# Patient Record
Sex: Female | Born: 1945 | Hispanic: No | Marital: Married | Smoking: Current every day smoker
Health system: Southern US, Community
[De-identification: ages and names within clinical notes are randomized; demographics above are authoritative.]

## PROBLEM LIST (undated history)

## (undated) DIAGNOSIS — J302 Other seasonal allergic rhinitis: Secondary | ICD-10-CM

## (undated) DIAGNOSIS — I499 Cardiac arrhythmia, unspecified: Secondary | ICD-10-CM

## (undated) DIAGNOSIS — C801 Malignant (primary) neoplasm, unspecified: Secondary | ICD-10-CM

## (undated) DIAGNOSIS — J189 Pneumonia, unspecified organism: Secondary | ICD-10-CM

## (undated) DIAGNOSIS — T7840XA Allergy, unspecified, initial encounter: Secondary | ICD-10-CM

## (undated) DIAGNOSIS — R911 Solitary pulmonary nodule: Secondary | ICD-10-CM

## (undated) DIAGNOSIS — K219 Gastro-esophageal reflux disease without esophagitis: Secondary | ICD-10-CM

## (undated) DIAGNOSIS — I219 Acute myocardial infarction, unspecified: Secondary | ICD-10-CM

## (undated) DIAGNOSIS — I10 Essential (primary) hypertension: Secondary | ICD-10-CM

## (undated) DIAGNOSIS — R011 Cardiac murmur, unspecified: Secondary | ICD-10-CM

## (undated) DIAGNOSIS — I4891 Unspecified atrial fibrillation: Secondary | ICD-10-CM

## (undated) DIAGNOSIS — Z720 Tobacco use: Secondary | ICD-10-CM

## (undated) DIAGNOSIS — R519 Headache, unspecified: Secondary | ICD-10-CM

## (undated) DIAGNOSIS — N183 Chronic kidney disease, stage 3 unspecified: Secondary | ICD-10-CM

## (undated) DIAGNOSIS — G4730 Sleep apnea, unspecified: Secondary | ICD-10-CM

## (undated) DIAGNOSIS — M810 Age-related osteoporosis without current pathological fracture: Secondary | ICD-10-CM

## (undated) DIAGNOSIS — J449 Chronic obstructive pulmonary disease, unspecified: Secondary | ICD-10-CM

## (undated) DIAGNOSIS — R7989 Other specified abnormal findings of blood chemistry: Secondary | ICD-10-CM

## (undated) DIAGNOSIS — I5032 Chronic diastolic (congestive) heart failure: Secondary | ICD-10-CM

## (undated) DIAGNOSIS — M069 Rheumatoid arthritis, unspecified: Secondary | ICD-10-CM

## (undated) DIAGNOSIS — Z8551 Personal history of malignant neoplasm of bladder: Secondary | ICD-10-CM

## (undated) DIAGNOSIS — I509 Heart failure, unspecified: Secondary | ICD-10-CM

## (undated) HISTORY — DX: Malignant (primary) neoplasm, unspecified: C80.1

## (undated) HISTORY — DX: Tobacco use: Z72.0

## (undated) HISTORY — DX: Solitary pulmonary nodule: R91.1

## (undated) HISTORY — DX: Sleep apnea, unspecified: G47.30

## (undated) HISTORY — PX: ABDOMINAL HYSTERECTOMY: SHX81

## (undated) HISTORY — DX: Other seasonal allergic rhinitis: J30.2

## (undated) HISTORY — DX: Other specified abnormal findings of blood chemistry: R79.89

## (undated) HISTORY — DX: Heart failure, unspecified: I50.9

## (undated) HISTORY — DX: Chronic diastolic (congestive) heart failure: I50.32

## (undated) HISTORY — DX: Rheumatoid arthritis, unspecified: M06.9

## (undated) HISTORY — PX: BLADDER SURGERY: SHX569

## (undated) HISTORY — DX: Age-related osteoporosis without current pathological fracture: M81.0

## (undated) HISTORY — PX: MOUTH SURGERY: SHX715

## (undated) HISTORY — DX: Allergy, unspecified, initial encounter: T78.40XA

## (undated) HISTORY — DX: Chronic kidney disease, stage 3 unspecified: N18.30

## (undated) HISTORY — DX: Essential (primary) hypertension: I10

## (undated) HISTORY — DX: Unspecified atrial fibrillation: I48.91

## (undated) HISTORY — DX: Chronic obstructive pulmonary disease, unspecified: J44.9

## (undated) HISTORY — DX: Personal history of malignant neoplasm of bladder: Z85.51

---

## 2011-03-29 DIAGNOSIS — J069 Acute upper respiratory infection, unspecified: Secondary | ICD-10-CM | POA: Diagnosis not present

## 2011-03-29 DIAGNOSIS — G608 Other hereditary and idiopathic neuropathies: Secondary | ICD-10-CM | POA: Diagnosis not present

## 2011-03-29 DIAGNOSIS — J209 Acute bronchitis, unspecified: Secondary | ICD-10-CM | POA: Diagnosis not present

## 2011-05-01 DIAGNOSIS — H1010 Acute atopic conjunctivitis, unspecified eye: Secondary | ICD-10-CM | POA: Diagnosis not present

## 2011-05-01 DIAGNOSIS — H35039 Hypertensive retinopathy, unspecified eye: Secondary | ICD-10-CM | POA: Diagnosis not present

## 2011-05-01 DIAGNOSIS — H2510 Age-related nuclear cataract, unspecified eye: Secondary | ICD-10-CM | POA: Diagnosis not present

## 2011-07-03 DIAGNOSIS — J189 Pneumonia, unspecified organism: Secondary | ICD-10-CM | POA: Diagnosis not present

## 2011-07-03 DIAGNOSIS — J0100 Acute maxillary sinusitis, unspecified: Secondary | ICD-10-CM | POA: Diagnosis not present

## 2011-07-03 DIAGNOSIS — R0609 Other forms of dyspnea: Secondary | ICD-10-CM | POA: Diagnosis not present

## 2011-07-16 DIAGNOSIS — J180 Bronchopneumonia, unspecified organism: Secondary | ICD-10-CM | POA: Diagnosis not present

## 2011-07-16 DIAGNOSIS — R062 Wheezing: Secondary | ICD-10-CM | POA: Diagnosis not present

## 2011-07-16 DIAGNOSIS — B370 Candidal stomatitis: Secondary | ICD-10-CM | POA: Diagnosis not present

## 2011-11-30 DIAGNOSIS — I10 Essential (primary) hypertension: Secondary | ICD-10-CM | POA: Diagnosis not present

## 2011-11-30 DIAGNOSIS — R05 Cough: Secondary | ICD-10-CM | POA: Diagnosis not present

## 2011-11-30 DIAGNOSIS — Z23 Encounter for immunization: Secondary | ICD-10-CM | POA: Diagnosis not present

## 2011-11-30 DIAGNOSIS — J309 Allergic rhinitis, unspecified: Secondary | ICD-10-CM | POA: Diagnosis not present

## 2011-11-30 DIAGNOSIS — R059 Cough, unspecified: Secondary | ICD-10-CM | POA: Diagnosis not present

## 2012-03-04 DIAGNOSIS — R059 Cough, unspecified: Secondary | ICD-10-CM | POA: Diagnosis not present

## 2012-03-04 DIAGNOSIS — R5381 Other malaise: Secondary | ICD-10-CM | POA: Diagnosis not present

## 2012-03-04 DIAGNOSIS — R5383 Other fatigue: Secondary | ICD-10-CM | POA: Diagnosis not present

## 2012-03-04 DIAGNOSIS — E538 Deficiency of other specified B group vitamins: Secondary | ICD-10-CM | POA: Diagnosis not present

## 2012-03-04 DIAGNOSIS — M25529 Pain in unspecified elbow: Secondary | ICD-10-CM | POA: Diagnosis not present

## 2012-03-04 DIAGNOSIS — M25579 Pain in unspecified ankle and joints of unspecified foot: Secondary | ICD-10-CM | POA: Diagnosis not present

## 2012-03-04 DIAGNOSIS — R062 Wheezing: Secondary | ICD-10-CM | POA: Diagnosis not present

## 2012-03-04 DIAGNOSIS — R05 Cough: Secondary | ICD-10-CM | POA: Diagnosis not present

## 2012-04-03 DIAGNOSIS — G4710 Hypersomnia, unspecified: Secondary | ICD-10-CM | POA: Diagnosis not present

## 2012-04-03 DIAGNOSIS — R0982 Postnasal drip: Secondary | ICD-10-CM | POA: Diagnosis not present

## 2012-04-03 DIAGNOSIS — R5381 Other malaise: Secondary | ICD-10-CM | POA: Diagnosis not present

## 2012-04-03 DIAGNOSIS — R0609 Other forms of dyspnea: Secondary | ICD-10-CM | POA: Diagnosis not present

## 2012-04-03 DIAGNOSIS — R5383 Other fatigue: Secondary | ICD-10-CM | POA: Diagnosis not present

## 2012-04-03 DIAGNOSIS — G4730 Sleep apnea, unspecified: Secondary | ICD-10-CM | POA: Diagnosis not present

## 2012-04-03 DIAGNOSIS — E559 Vitamin D deficiency, unspecified: Secondary | ICD-10-CM | POA: Diagnosis not present

## 2012-04-10 DIAGNOSIS — G4730 Sleep apnea, unspecified: Secondary | ICD-10-CM | POA: Diagnosis not present

## 2012-04-10 DIAGNOSIS — G4710 Hypersomnia, unspecified: Secondary | ICD-10-CM | POA: Diagnosis not present

## 2012-04-17 DIAGNOSIS — R0609 Other forms of dyspnea: Secondary | ICD-10-CM | POA: Diagnosis not present

## 2012-04-17 DIAGNOSIS — R5381 Other malaise: Secondary | ICD-10-CM | POA: Diagnosis not present

## 2012-04-17 DIAGNOSIS — R0982 Postnasal drip: Secondary | ICD-10-CM | POA: Diagnosis not present

## 2012-04-17 DIAGNOSIS — G4730 Sleep apnea, unspecified: Secondary | ICD-10-CM | POA: Diagnosis not present

## 2012-04-17 DIAGNOSIS — G4710 Hypersomnia, unspecified: Secondary | ICD-10-CM | POA: Diagnosis not present

## 2012-04-17 DIAGNOSIS — R0989 Other specified symptoms and signs involving the circulatory and respiratory systems: Secondary | ICD-10-CM | POA: Diagnosis not present

## 2012-05-03 DIAGNOSIS — G4710 Hypersomnia, unspecified: Secondary | ICD-10-CM | POA: Diagnosis not present

## 2012-05-03 DIAGNOSIS — G4730 Sleep apnea, unspecified: Secondary | ICD-10-CM | POA: Diagnosis not present

## 2012-05-15 DIAGNOSIS — R0989 Other specified symptoms and signs involving the circulatory and respiratory systems: Secondary | ICD-10-CM | POA: Diagnosis not present

## 2012-05-15 DIAGNOSIS — G4730 Sleep apnea, unspecified: Secondary | ICD-10-CM | POA: Diagnosis not present

## 2012-05-15 DIAGNOSIS — R5383 Other fatigue: Secondary | ICD-10-CM | POA: Diagnosis not present

## 2012-05-15 DIAGNOSIS — R0609 Other forms of dyspnea: Secondary | ICD-10-CM | POA: Diagnosis not present

## 2012-05-15 DIAGNOSIS — R5381 Other malaise: Secondary | ICD-10-CM | POA: Diagnosis not present

## 2012-05-15 DIAGNOSIS — G4710 Hypersomnia, unspecified: Secondary | ICD-10-CM | POA: Diagnosis not present

## 2012-05-15 DIAGNOSIS — R0982 Postnasal drip: Secondary | ICD-10-CM | POA: Diagnosis not present

## 2012-05-15 DIAGNOSIS — Z006 Encounter for examination for normal comparison and control in clinical research program: Secondary | ICD-10-CM | POA: Diagnosis not present

## 2012-05-21 DIAGNOSIS — G4730 Sleep apnea, unspecified: Secondary | ICD-10-CM | POA: Diagnosis not present

## 2012-05-21 DIAGNOSIS — G4710 Hypersomnia, unspecified: Secondary | ICD-10-CM | POA: Diagnosis not present

## 2012-05-28 DIAGNOSIS — G4730 Sleep apnea, unspecified: Secondary | ICD-10-CM | POA: Diagnosis not present

## 2012-05-28 DIAGNOSIS — G4710 Hypersomnia, unspecified: Secondary | ICD-10-CM | POA: Diagnosis not present

## 2012-05-28 DIAGNOSIS — R0609 Other forms of dyspnea: Secondary | ICD-10-CM | POA: Diagnosis not present

## 2012-05-28 DIAGNOSIS — R0982 Postnasal drip: Secondary | ICD-10-CM | POA: Diagnosis not present

## 2012-05-28 DIAGNOSIS — R5381 Other malaise: Secondary | ICD-10-CM | POA: Diagnosis not present

## 2012-06-05 DIAGNOSIS — G4730 Sleep apnea, unspecified: Secondary | ICD-10-CM | POA: Diagnosis not present

## 2012-06-05 DIAGNOSIS — G4710 Hypersomnia, unspecified: Secondary | ICD-10-CM | POA: Diagnosis not present

## 2012-07-07 DIAGNOSIS — R0982 Postnasal drip: Secondary | ICD-10-CM | POA: Diagnosis not present

## 2012-07-07 DIAGNOSIS — R5381 Other malaise: Secondary | ICD-10-CM | POA: Diagnosis not present

## 2012-07-07 DIAGNOSIS — R0609 Other forms of dyspnea: Secondary | ICD-10-CM | POA: Diagnosis not present

## 2012-07-07 DIAGNOSIS — G4710 Hypersomnia, unspecified: Secondary | ICD-10-CM | POA: Diagnosis not present

## 2012-07-07 DIAGNOSIS — R0989 Other specified symptoms and signs involving the circulatory and respiratory systems: Secondary | ICD-10-CM | POA: Diagnosis not present

## 2012-07-07 DIAGNOSIS — F17200 Nicotine dependence, unspecified, uncomplicated: Secondary | ICD-10-CM | POA: Diagnosis not present

## 2012-07-07 DIAGNOSIS — R05 Cough: Secondary | ICD-10-CM | POA: Diagnosis not present

## 2012-07-07 DIAGNOSIS — R5383 Other fatigue: Secondary | ICD-10-CM | POA: Diagnosis not present

## 2012-07-07 DIAGNOSIS — R059 Cough, unspecified: Secondary | ICD-10-CM | POA: Diagnosis not present

## 2012-07-08 DIAGNOSIS — G4710 Hypersomnia, unspecified: Secondary | ICD-10-CM | POA: Diagnosis not present

## 2012-07-09 DIAGNOSIS — R05 Cough: Secondary | ICD-10-CM | POA: Diagnosis not present

## 2012-07-09 DIAGNOSIS — J309 Allergic rhinitis, unspecified: Secondary | ICD-10-CM | POA: Diagnosis not present

## 2012-07-09 DIAGNOSIS — R059 Cough, unspecified: Secondary | ICD-10-CM | POA: Diagnosis not present

## 2012-07-09 DIAGNOSIS — R911 Solitary pulmonary nodule: Secondary | ICD-10-CM | POA: Diagnosis not present

## 2012-07-16 DIAGNOSIS — J309 Allergic rhinitis, unspecified: Secondary | ICD-10-CM | POA: Diagnosis not present

## 2012-07-21 DIAGNOSIS — N289 Disorder of kidney and ureter, unspecified: Secondary | ICD-10-CM | POA: Diagnosis not present

## 2012-07-21 DIAGNOSIS — E279 Disorder of adrenal gland, unspecified: Secondary | ICD-10-CM | POA: Diagnosis not present

## 2012-07-21 DIAGNOSIS — D3500 Benign neoplasm of unspecified adrenal gland: Secondary | ICD-10-CM | POA: Diagnosis not present

## 2012-07-24 DIAGNOSIS — J984 Other disorders of lung: Secondary | ICD-10-CM | POA: Diagnosis not present

## 2012-07-24 DIAGNOSIS — R5383 Other fatigue: Secondary | ICD-10-CM | POA: Diagnosis not present

## 2012-07-24 DIAGNOSIS — R0982 Postnasal drip: Secondary | ICD-10-CM | POA: Diagnosis not present

## 2012-07-24 DIAGNOSIS — R0989 Other specified symptoms and signs involving the circulatory and respiratory systems: Secondary | ICD-10-CM | POA: Diagnosis not present

## 2012-07-24 DIAGNOSIS — R0609 Other forms of dyspnea: Secondary | ICD-10-CM | POA: Diagnosis not present

## 2012-07-24 DIAGNOSIS — R5381 Other malaise: Secondary | ICD-10-CM | POA: Diagnosis not present

## 2012-07-24 DIAGNOSIS — G4710 Hypersomnia, unspecified: Secondary | ICD-10-CM | POA: Diagnosis not present

## 2012-07-24 DIAGNOSIS — F17200 Nicotine dependence, unspecified, uncomplicated: Secondary | ICD-10-CM | POA: Diagnosis not present

## 2012-12-08 DIAGNOSIS — S139XXA Sprain of joints and ligaments of unspecified parts of neck, initial encounter: Secondary | ICD-10-CM | POA: Diagnosis not present

## 2012-12-31 DIAGNOSIS — M542 Cervicalgia: Secondary | ICD-10-CM | POA: Diagnosis not present

## 2012-12-31 DIAGNOSIS — M5090 Cervical disc disorder, unspecified, unspecified cervical region: Secondary | ICD-10-CM | POA: Diagnosis not present

## 2012-12-31 DIAGNOSIS — M47812 Spondylosis without myelopathy or radiculopathy, cervical region: Secondary | ICD-10-CM | POA: Diagnosis not present

## 2013-02-04 DIAGNOSIS — I10 Essential (primary) hypertension: Secondary | ICD-10-CM | POA: Diagnosis not present

## 2013-02-04 DIAGNOSIS — Z23 Encounter for immunization: Secondary | ICD-10-CM | POA: Diagnosis not present

## 2013-05-01 DIAGNOSIS — J069 Acute upper respiratory infection, unspecified: Secondary | ICD-10-CM | POA: Diagnosis not present

## 2013-05-01 DIAGNOSIS — R05 Cough: Secondary | ICD-10-CM | POA: Diagnosis not present

## 2013-05-01 DIAGNOSIS — R059 Cough, unspecified: Secondary | ICD-10-CM | POA: Diagnosis not present

## 2013-08-21 DIAGNOSIS — I10 Essential (primary) hypertension: Secondary | ICD-10-CM | POA: Diagnosis not present

## 2013-08-21 DIAGNOSIS — J441 Chronic obstructive pulmonary disease with (acute) exacerbation: Secondary | ICD-10-CM | POA: Diagnosis not present

## 2013-11-24 DIAGNOSIS — G4710 Hypersomnia, unspecified: Secondary | ICD-10-CM | POA: Diagnosis not present

## 2013-11-24 DIAGNOSIS — I10 Essential (primary) hypertension: Secondary | ICD-10-CM | POA: Diagnosis not present

## 2013-11-24 DIAGNOSIS — J449 Chronic obstructive pulmonary disease, unspecified: Secondary | ICD-10-CM | POA: Diagnosis not present

## 2013-11-24 DIAGNOSIS — G4730 Sleep apnea, unspecified: Secondary | ICD-10-CM | POA: Diagnosis not present

## 2013-11-24 DIAGNOSIS — E559 Vitamin D deficiency, unspecified: Secondary | ICD-10-CM | POA: Diagnosis not present

## 2013-11-24 DIAGNOSIS — F17200 Nicotine dependence, unspecified, uncomplicated: Secondary | ICD-10-CM | POA: Diagnosis not present

## 2013-11-24 DIAGNOSIS — J309 Allergic rhinitis, unspecified: Secondary | ICD-10-CM | POA: Diagnosis not present

## 2013-11-24 DIAGNOSIS — M1990 Unspecified osteoarthritis, unspecified site: Secondary | ICD-10-CM | POA: Diagnosis not present

## 2013-11-24 DIAGNOSIS — Z23 Encounter for immunization: Secondary | ICD-10-CM | POA: Diagnosis not present

## 2014-01-12 DIAGNOSIS — Z1231 Encounter for screening mammogram for malignant neoplasm of breast: Secondary | ICD-10-CM | POA: Diagnosis not present

## 2014-01-12 DIAGNOSIS — Z13820 Encounter for screening for osteoporosis: Secondary | ICD-10-CM | POA: Diagnosis not present

## 2014-01-12 DIAGNOSIS — Z780 Asymptomatic menopausal state: Secondary | ICD-10-CM | POA: Diagnosis not present

## 2014-01-12 DIAGNOSIS — M85852 Other specified disorders of bone density and structure, left thigh: Secondary | ICD-10-CM | POA: Diagnosis not present

## 2014-02-26 DIAGNOSIS — J302 Other seasonal allergic rhinitis: Secondary | ICD-10-CM | POA: Diagnosis not present

## 2014-02-26 DIAGNOSIS — I10 Essential (primary) hypertension: Secondary | ICD-10-CM | POA: Diagnosis not present

## 2014-02-26 DIAGNOSIS — M8580 Other specified disorders of bone density and structure, unspecified site: Secondary | ICD-10-CM | POA: Diagnosis not present

## 2014-02-26 DIAGNOSIS — Z87891 Personal history of nicotine dependence: Secondary | ICD-10-CM | POA: Diagnosis not present

## 2014-03-03 DIAGNOSIS — M8580 Other specified disorders of bone density and structure, unspecified site: Secondary | ICD-10-CM | POA: Diagnosis not present

## 2014-03-25 DIAGNOSIS — M810 Age-related osteoporosis without current pathological fracture: Secondary | ICD-10-CM | POA: Diagnosis not present

## 2014-03-31 DIAGNOSIS — J209 Acute bronchitis, unspecified: Secondary | ICD-10-CM | POA: Diagnosis not present

## 2014-05-28 DIAGNOSIS — J302 Other seasonal allergic rhinitis: Secondary | ICD-10-CM | POA: Diagnosis not present

## 2014-05-28 DIAGNOSIS — I10 Essential (primary) hypertension: Secondary | ICD-10-CM | POA: Diagnosis not present

## 2014-08-30 DIAGNOSIS — I10 Essential (primary) hypertension: Secondary | ICD-10-CM | POA: Diagnosis not present

## 2014-09-06 DIAGNOSIS — L820 Inflamed seborrheic keratosis: Secondary | ICD-10-CM | POA: Diagnosis not present

## 2014-09-06 DIAGNOSIS — L821 Other seborrheic keratosis: Secondary | ICD-10-CM | POA: Diagnosis not present

## 2014-12-01 DIAGNOSIS — Z87891 Personal history of nicotine dependence: Secondary | ICD-10-CM | POA: Diagnosis not present

## 2014-12-01 DIAGNOSIS — Z23 Encounter for immunization: Secondary | ICD-10-CM | POA: Diagnosis not present

## 2014-12-01 DIAGNOSIS — J449 Chronic obstructive pulmonary disease, unspecified: Secondary | ICD-10-CM | POA: Diagnosis not present

## 2014-12-01 DIAGNOSIS — I10 Essential (primary) hypertension: Secondary | ICD-10-CM | POA: Diagnosis not present

## 2014-12-01 DIAGNOSIS — Z720 Tobacco use: Secondary | ICD-10-CM | POA: Diagnosis not present

## 2014-12-01 DIAGNOSIS — M8580 Other specified disorders of bone density and structure, unspecified site: Secondary | ICD-10-CM | POA: Diagnosis not present

## 2014-12-30 DIAGNOSIS — I10 Essential (primary) hypertension: Secondary | ICD-10-CM | POA: Diagnosis not present

## 2014-12-30 DIAGNOSIS — S51812A Laceration without foreign body of left forearm, initial encounter: Secondary | ICD-10-CM | POA: Diagnosis not present

## 2015-02-12 DIAGNOSIS — J189 Pneumonia, unspecified organism: Secondary | ICD-10-CM | POA: Diagnosis not present

## 2015-03-09 DIAGNOSIS — I10 Essential (primary) hypertension: Secondary | ICD-10-CM | POA: Diagnosis not present

## 2015-03-09 DIAGNOSIS — J439 Emphysema, unspecified: Secondary | ICD-10-CM | POA: Diagnosis not present

## 2015-03-09 DIAGNOSIS — M150 Primary generalized (osteo)arthritis: Secondary | ICD-10-CM | POA: Diagnosis not present

## 2015-03-09 DIAGNOSIS — G4733 Obstructive sleep apnea (adult) (pediatric): Secondary | ICD-10-CM | POA: Diagnosis not present

## 2015-03-09 DIAGNOSIS — R911 Solitary pulmonary nodule: Secondary | ICD-10-CM | POA: Diagnosis not present

## 2015-03-09 DIAGNOSIS — E559 Vitamin D deficiency, unspecified: Secondary | ICD-10-CM | POA: Diagnosis not present

## 2015-03-09 DIAGNOSIS — Z1389 Encounter for screening for other disorder: Secondary | ICD-10-CM | POA: Diagnosis not present

## 2015-03-09 DIAGNOSIS — J302 Other seasonal allergic rhinitis: Secondary | ICD-10-CM | POA: Diagnosis not present

## 2015-03-15 DIAGNOSIS — Z1231 Encounter for screening mammogram for malignant neoplasm of breast: Secondary | ICD-10-CM | POA: Diagnosis not present

## 2015-03-29 DIAGNOSIS — M810 Age-related osteoporosis without current pathological fracture: Secondary | ICD-10-CM | POA: Diagnosis not present

## 2015-05-27 DIAGNOSIS — D3502 Benign neoplasm of left adrenal gland: Secondary | ICD-10-CM | POA: Diagnosis not present

## 2015-05-27 DIAGNOSIS — K769 Liver disease, unspecified: Secondary | ICD-10-CM | POA: Diagnosis not present

## 2015-05-27 DIAGNOSIS — R1013 Epigastric pain: Secondary | ICD-10-CM | POA: Diagnosis not present

## 2015-05-27 DIAGNOSIS — R1031 Right lower quadrant pain: Secondary | ICD-10-CM | POA: Diagnosis not present

## 2015-05-27 DIAGNOSIS — N281 Cyst of kidney, acquired: Secondary | ICD-10-CM | POA: Diagnosis not present

## 2015-05-27 DIAGNOSIS — I700 Atherosclerosis of aorta: Secondary | ICD-10-CM | POA: Diagnosis not present

## 2015-05-27 DIAGNOSIS — R935 Abnormal findings on diagnostic imaging of other abdominal regions, including retroperitoneum: Secondary | ICD-10-CM | POA: Diagnosis not present

## 2015-05-27 DIAGNOSIS — A09 Infectious gastroenteritis and colitis, unspecified: Secondary | ICD-10-CM | POA: Diagnosis not present

## 2015-05-31 DIAGNOSIS — A09 Infectious gastroenteritis and colitis, unspecified: Secondary | ICD-10-CM | POA: Diagnosis not present

## 2015-06-13 DIAGNOSIS — R311 Benign essential microscopic hematuria: Secondary | ICD-10-CM | POA: Diagnosis not present

## 2015-06-13 DIAGNOSIS — R9341 Abnormal radiologic findings on diagnostic imaging of renal pelvis, ureter, or bladder: Secondary | ICD-10-CM | POA: Diagnosis not present

## 2015-06-13 DIAGNOSIS — N3021 Other chronic cystitis with hematuria: Secondary | ICD-10-CM | POA: Diagnosis not present

## 2015-06-13 DIAGNOSIS — C674 Malignant neoplasm of posterior wall of bladder: Secondary | ICD-10-CM | POA: Diagnosis not present

## 2015-06-15 DIAGNOSIS — C679 Malignant neoplasm of bladder, unspecified: Secondary | ICD-10-CM | POA: Diagnosis not present

## 2015-06-15 DIAGNOSIS — G4730 Sleep apnea, unspecified: Secondary | ICD-10-CM | POA: Diagnosis not present

## 2015-06-15 DIAGNOSIS — J449 Chronic obstructive pulmonary disease, unspecified: Secondary | ICD-10-CM | POA: Diagnosis not present

## 2015-06-15 DIAGNOSIS — C674 Malignant neoplasm of posterior wall of bladder: Secondary | ICD-10-CM | POA: Diagnosis not present

## 2015-06-15 DIAGNOSIS — D494 Neoplasm of unspecified behavior of bladder: Secondary | ICD-10-CM | POA: Diagnosis not present

## 2015-06-15 DIAGNOSIS — K449 Diaphragmatic hernia without obstruction or gangrene: Secondary | ICD-10-CM | POA: Diagnosis not present

## 2015-06-15 DIAGNOSIS — I10 Essential (primary) hypertension: Secondary | ICD-10-CM | POA: Diagnosis not present

## 2015-06-29 DIAGNOSIS — R9341 Abnormal radiologic findings on diagnostic imaging of renal pelvis, ureter, or bladder: Secondary | ICD-10-CM | POA: Diagnosis not present

## 2015-06-29 DIAGNOSIS — C674 Malignant neoplasm of posterior wall of bladder: Secondary | ICD-10-CM | POA: Diagnosis not present

## 2015-06-29 DIAGNOSIS — N3020 Other chronic cystitis without hematuria: Secondary | ICD-10-CM | POA: Diagnosis not present

## 2015-08-25 DIAGNOSIS — H2513 Age-related nuclear cataract, bilateral: Secondary | ICD-10-CM | POA: Diagnosis not present

## 2015-08-25 DIAGNOSIS — H5319 Other subjective visual disturbances: Secondary | ICD-10-CM | POA: Diagnosis not present

## 2015-08-25 DIAGNOSIS — H524 Presbyopia: Secondary | ICD-10-CM | POA: Diagnosis not present

## 2015-08-25 DIAGNOSIS — H35372 Puckering of macula, left eye: Secondary | ICD-10-CM | POA: Diagnosis not present

## 2015-09-21 DIAGNOSIS — R05 Cough: Secondary | ICD-10-CM | POA: Diagnosis not present

## 2015-09-21 DIAGNOSIS — R0600 Dyspnea, unspecified: Secondary | ICD-10-CM | POA: Diagnosis not present

## 2015-09-21 DIAGNOSIS — R062 Wheezing: Secondary | ICD-10-CM | POA: Diagnosis not present

## 2015-09-29 DIAGNOSIS — N3021 Other chronic cystitis with hematuria: Secondary | ICD-10-CM | POA: Diagnosis not present

## 2015-09-29 DIAGNOSIS — R9341 Abnormal radiologic findings on diagnostic imaging of renal pelvis, ureter, or bladder: Secondary | ICD-10-CM | POA: Diagnosis not present

## 2015-09-29 DIAGNOSIS — R311 Benign essential microscopic hematuria: Secondary | ICD-10-CM | POA: Diagnosis not present

## 2015-09-29 DIAGNOSIS — C674 Malignant neoplasm of posterior wall of bladder: Secondary | ICD-10-CM | POA: Diagnosis not present

## 2015-10-15 DIAGNOSIS — R748 Abnormal levels of other serum enzymes: Secondary | ICD-10-CM | POA: Diagnosis not present

## 2015-10-15 DIAGNOSIS — I4892 Unspecified atrial flutter: Secondary | ICD-10-CM | POA: Diagnosis not present

## 2015-10-15 DIAGNOSIS — F17210 Nicotine dependence, cigarettes, uncomplicated: Secondary | ICD-10-CM | POA: Diagnosis not present

## 2015-10-15 DIAGNOSIS — I161 Hypertensive emergency: Secondary | ICD-10-CM | POA: Diagnosis not present

## 2015-10-15 DIAGNOSIS — R072 Precordial pain: Secondary | ICD-10-CM | POA: Diagnosis not present

## 2015-10-15 DIAGNOSIS — I480 Paroxysmal atrial fibrillation: Secondary | ICD-10-CM | POA: Diagnosis not present

## 2015-10-15 DIAGNOSIS — J441 Chronic obstructive pulmonary disease with (acute) exacerbation: Secondary | ICD-10-CM | POA: Diagnosis not present

## 2015-10-15 DIAGNOSIS — I214 Non-ST elevation (NSTEMI) myocardial infarction: Secondary | ICD-10-CM | POA: Diagnosis not present

## 2015-10-15 DIAGNOSIS — I4891 Unspecified atrial fibrillation: Secondary | ICD-10-CM | POA: Diagnosis not present

## 2015-10-15 DIAGNOSIS — R0902 Hypoxemia: Secondary | ICD-10-CM | POA: Diagnosis not present

## 2015-10-15 DIAGNOSIS — Z6828 Body mass index (BMI) 28.0-28.9, adult: Secondary | ICD-10-CM | POA: Diagnosis not present

## 2015-10-15 DIAGNOSIS — E663 Overweight: Secondary | ICD-10-CM | POA: Diagnosis not present

## 2015-10-15 DIAGNOSIS — Z79899 Other long term (current) drug therapy: Secondary | ICD-10-CM | POA: Diagnosis not present

## 2015-10-15 DIAGNOSIS — I119 Hypertensive heart disease without heart failure: Secondary | ICD-10-CM | POA: Diagnosis not present

## 2015-10-15 DIAGNOSIS — R000 Tachycardia, unspecified: Secondary | ICD-10-CM | POA: Diagnosis not present

## 2015-10-15 DIAGNOSIS — I340 Nonrheumatic mitral (valve) insufficiency: Secondary | ICD-10-CM | POA: Diagnosis not present

## 2015-10-15 DIAGNOSIS — R079 Chest pain, unspecified: Secondary | ICD-10-CM | POA: Diagnosis not present

## 2015-10-16 DIAGNOSIS — I119 Hypertensive heart disease without heart failure: Secondary | ICD-10-CM | POA: Diagnosis not present

## 2015-10-16 DIAGNOSIS — I480 Paroxysmal atrial fibrillation: Secondary | ICD-10-CM | POA: Diagnosis not present

## 2015-10-16 DIAGNOSIS — R748 Abnormal levels of other serum enzymes: Secondary | ICD-10-CM | POA: Diagnosis not present

## 2015-10-16 DIAGNOSIS — I214 Non-ST elevation (NSTEMI) myocardial infarction: Secondary | ICD-10-CM | POA: Diagnosis not present

## 2015-10-17 DIAGNOSIS — R072 Precordial pain: Secondary | ICD-10-CM | POA: Insufficient documentation

## 2015-10-17 DIAGNOSIS — I10 Essential (primary) hypertension: Secondary | ICD-10-CM | POA: Diagnosis not present

## 2015-10-17 DIAGNOSIS — I214 Non-ST elevation (NSTEMI) myocardial infarction: Secondary | ICD-10-CM | POA: Diagnosis not present

## 2015-10-17 DIAGNOSIS — J441 Chronic obstructive pulmonary disease with (acute) exacerbation: Secondary | ICD-10-CM | POA: Diagnosis not present

## 2015-10-17 DIAGNOSIS — Z79899 Other long term (current) drug therapy: Secondary | ICD-10-CM | POA: Diagnosis not present

## 2015-10-17 DIAGNOSIS — F17210 Nicotine dependence, cigarettes, uncomplicated: Secondary | ICD-10-CM | POA: Diagnosis not present

## 2015-10-17 DIAGNOSIS — I7776 Dissection of artery of upper extremity: Secondary | ICD-10-CM | POA: Diagnosis not present

## 2015-10-17 DIAGNOSIS — I4891 Unspecified atrial fibrillation: Secondary | ICD-10-CM | POA: Diagnosis not present

## 2015-10-17 DIAGNOSIS — I119 Hypertensive heart disease without heart failure: Secondary | ICD-10-CM | POA: Diagnosis not present

## 2015-10-17 DIAGNOSIS — I480 Paroxysmal atrial fibrillation: Secondary | ICD-10-CM | POA: Diagnosis not present

## 2015-10-17 DIAGNOSIS — Z7901 Long term (current) use of anticoagulants: Secondary | ICD-10-CM | POA: Diagnosis not present

## 2015-10-17 HISTORY — DX: Precordial pain: R07.2

## 2015-10-21 DIAGNOSIS — I10 Essential (primary) hypertension: Secondary | ICD-10-CM | POA: Diagnosis not present

## 2015-11-23 ENCOUNTER — Other Ambulatory Visit: Payer: Self-pay

## 2016-01-05 DIAGNOSIS — N3020 Other chronic cystitis without hematuria: Secondary | ICD-10-CM | POA: Diagnosis not present

## 2016-01-05 DIAGNOSIS — C674 Malignant neoplasm of posterior wall of bladder: Secondary | ICD-10-CM | POA: Diagnosis not present

## 2016-01-05 DIAGNOSIS — Z23 Encounter for immunization: Secondary | ICD-10-CM | POA: Diagnosis not present

## 2016-01-24 DIAGNOSIS — E7800 Pure hypercholesterolemia, unspecified: Secondary | ICD-10-CM | POA: Diagnosis not present

## 2016-01-24 DIAGNOSIS — I10 Essential (primary) hypertension: Secondary | ICD-10-CM | POA: Diagnosis not present

## 2016-01-24 DIAGNOSIS — Z720 Tobacco use: Secondary | ICD-10-CM | POA: Diagnosis not present

## 2016-01-24 DIAGNOSIS — Z87891 Personal history of nicotine dependence: Secondary | ICD-10-CM | POA: Diagnosis not present

## 2016-03-14 DIAGNOSIS — J0100 Acute maxillary sinusitis, unspecified: Secondary | ICD-10-CM | POA: Diagnosis not present

## 2016-03-14 DIAGNOSIS — Z87891 Personal history of nicotine dependence: Secondary | ICD-10-CM | POA: Diagnosis not present

## 2016-03-14 DIAGNOSIS — G4733 Obstructive sleep apnea (adult) (pediatric): Secondary | ICD-10-CM | POA: Diagnosis not present

## 2016-03-14 DIAGNOSIS — Z1389 Encounter for screening for other disorder: Secondary | ICD-10-CM | POA: Diagnosis not present

## 2016-03-14 DIAGNOSIS — I10 Essential (primary) hypertension: Secondary | ICD-10-CM | POA: Diagnosis not present

## 2016-03-14 DIAGNOSIS — M1990 Unspecified osteoarthritis, unspecified site: Secondary | ICD-10-CM | POA: Diagnosis not present

## 2016-03-14 DIAGNOSIS — J449 Chronic obstructive pulmonary disease, unspecified: Secondary | ICD-10-CM | POA: Diagnosis not present

## 2016-03-14 DIAGNOSIS — R911 Solitary pulmonary nodule: Secondary | ICD-10-CM | POA: Diagnosis not present

## 2016-03-14 DIAGNOSIS — J302 Other seasonal allergic rhinitis: Secondary | ICD-10-CM | POA: Diagnosis not present

## 2016-03-14 DIAGNOSIS — Z720 Tobacco use: Secondary | ICD-10-CM | POA: Diagnosis not present

## 2016-03-14 DIAGNOSIS — M8580 Other specified disorders of bone density and structure, unspecified site: Secondary | ICD-10-CM | POA: Diagnosis not present

## 2016-03-14 DIAGNOSIS — E559 Vitamin D deficiency, unspecified: Secondary | ICD-10-CM | POA: Diagnosis not present

## 2016-04-03 DIAGNOSIS — N3020 Other chronic cystitis without hematuria: Secondary | ICD-10-CM | POA: Diagnosis not present

## 2016-04-03 DIAGNOSIS — C674 Malignant neoplasm of posterior wall of bladder: Secondary | ICD-10-CM | POA: Diagnosis not present

## 2016-04-06 DIAGNOSIS — M85851 Other specified disorders of bone density and structure, right thigh: Secondary | ICD-10-CM | POA: Diagnosis not present

## 2016-04-06 DIAGNOSIS — M810 Age-related osteoporosis without current pathological fracture: Secondary | ICD-10-CM | POA: Diagnosis not present

## 2016-04-06 DIAGNOSIS — Z1231 Encounter for screening mammogram for malignant neoplasm of breast: Secondary | ICD-10-CM | POA: Diagnosis not present

## 2016-04-18 DIAGNOSIS — M810 Age-related osteoporosis without current pathological fracture: Secondary | ICD-10-CM | POA: Diagnosis not present

## 2016-06-13 DIAGNOSIS — M8580 Other specified disorders of bone density and structure, unspecified site: Secondary | ICD-10-CM | POA: Diagnosis not present

## 2016-06-13 DIAGNOSIS — Z87891 Personal history of nicotine dependence: Secondary | ICD-10-CM | POA: Diagnosis not present

## 2016-06-13 DIAGNOSIS — I10 Essential (primary) hypertension: Secondary | ICD-10-CM | POA: Diagnosis not present

## 2016-06-13 DIAGNOSIS — Z720 Tobacco use: Secondary | ICD-10-CM | POA: Diagnosis not present

## 2016-06-13 DIAGNOSIS — E229 Hyperfunction of pituitary gland, unspecified: Secondary | ICD-10-CM | POA: Diagnosis not present

## 2016-07-02 DIAGNOSIS — N3020 Other chronic cystitis without hematuria: Secondary | ICD-10-CM | POA: Diagnosis not present

## 2016-07-02 DIAGNOSIS — C674 Malignant neoplasm of posterior wall of bladder: Secondary | ICD-10-CM | POA: Diagnosis not present

## 2016-08-27 DIAGNOSIS — H524 Presbyopia: Secondary | ICD-10-CM | POA: Diagnosis not present

## 2016-08-27 DIAGNOSIS — H2513 Age-related nuclear cataract, bilateral: Secondary | ICD-10-CM | POA: Diagnosis not present

## 2016-09-13 DIAGNOSIS — R0602 Shortness of breath: Secondary | ICD-10-CM | POA: Diagnosis not present

## 2016-09-13 DIAGNOSIS — Z79899 Other long term (current) drug therapy: Secondary | ICD-10-CM | POA: Diagnosis not present

## 2016-09-13 DIAGNOSIS — I10 Essential (primary) hypertension: Secondary | ICD-10-CM | POA: Diagnosis not present

## 2016-09-13 DIAGNOSIS — R778 Other specified abnormalities of plasma proteins: Secondary | ICD-10-CM | POA: Diagnosis not present

## 2016-09-13 DIAGNOSIS — I471 Supraventricular tachycardia: Secondary | ICD-10-CM | POA: Diagnosis not present

## 2016-09-13 DIAGNOSIS — I4891 Unspecified atrial fibrillation: Secondary | ICD-10-CM | POA: Diagnosis not present

## 2016-09-13 DIAGNOSIS — Z7982 Long term (current) use of aspirin: Secondary | ICD-10-CM | POA: Diagnosis not present

## 2016-09-13 DIAGNOSIS — R51 Headache: Secondary | ICD-10-CM | POA: Diagnosis not present

## 2016-09-13 DIAGNOSIS — Z716 Tobacco abuse counseling: Secondary | ICD-10-CM | POA: Diagnosis not present

## 2016-09-13 DIAGNOSIS — F17210 Nicotine dependence, cigarettes, uncomplicated: Secondary | ICD-10-CM | POA: Diagnosis not present

## 2016-09-13 DIAGNOSIS — E785 Hyperlipidemia, unspecified: Secondary | ICD-10-CM | POA: Diagnosis not present

## 2016-09-13 DIAGNOSIS — I248 Other forms of acute ischemic heart disease: Secondary | ICD-10-CM | POA: Diagnosis not present

## 2016-09-13 DIAGNOSIS — I480 Paroxysmal atrial fibrillation: Secondary | ICD-10-CM | POA: Diagnosis not present

## 2016-09-13 DIAGNOSIS — R079 Chest pain, unspecified: Secondary | ICD-10-CM | POA: Diagnosis not present

## 2016-09-13 DIAGNOSIS — Z720 Tobacco use: Secondary | ICD-10-CM | POA: Diagnosis not present

## 2016-09-14 DIAGNOSIS — I361 Nonrheumatic tricuspid (valve) insufficiency: Secondary | ICD-10-CM | POA: Diagnosis not present

## 2016-09-14 DIAGNOSIS — F17210 Nicotine dependence, cigarettes, uncomplicated: Secondary | ICD-10-CM | POA: Diagnosis not present

## 2016-09-14 DIAGNOSIS — I480 Paroxysmal atrial fibrillation: Secondary | ICD-10-CM | POA: Diagnosis not present

## 2016-09-14 DIAGNOSIS — I214 Non-ST elevation (NSTEMI) myocardial infarction: Secondary | ICD-10-CM | POA: Diagnosis not present

## 2016-09-14 DIAGNOSIS — Z8249 Family history of ischemic heart disease and other diseases of the circulatory system: Secondary | ICD-10-CM | POA: Diagnosis not present

## 2016-09-14 DIAGNOSIS — Z720 Tobacco use: Secondary | ICD-10-CM | POA: Diagnosis not present

## 2016-09-14 DIAGNOSIS — I252 Old myocardial infarction: Secondary | ICD-10-CM | POA: Diagnosis not present

## 2016-09-14 DIAGNOSIS — R079 Chest pain, unspecified: Secondary | ICD-10-CM | POA: Diagnosis not present

## 2016-09-14 DIAGNOSIS — I340 Nonrheumatic mitral (valve) insufficiency: Secondary | ICD-10-CM | POA: Diagnosis not present

## 2016-09-14 DIAGNOSIS — I10 Essential (primary) hypertension: Secondary | ICD-10-CM | POA: Diagnosis not present

## 2016-09-15 DIAGNOSIS — Z720 Tobacco use: Secondary | ICD-10-CM | POA: Diagnosis not present

## 2016-09-15 DIAGNOSIS — R079 Chest pain, unspecified: Secondary | ICD-10-CM | POA: Diagnosis not present

## 2016-09-15 DIAGNOSIS — I480 Paroxysmal atrial fibrillation: Secondary | ICD-10-CM | POA: Diagnosis not present

## 2016-09-15 DIAGNOSIS — I10 Essential (primary) hypertension: Secondary | ICD-10-CM | POA: Diagnosis not present

## 2016-09-21 DIAGNOSIS — M25512 Pain in left shoulder: Secondary | ICD-10-CM | POA: Diagnosis not present

## 2016-09-21 DIAGNOSIS — I4891 Unspecified atrial fibrillation: Secondary | ICD-10-CM | POA: Diagnosis not present

## 2016-09-24 ENCOUNTER — Encounter: Payer: Self-pay | Admitting: Cardiology

## 2016-09-24 DIAGNOSIS — I480 Paroxysmal atrial fibrillation: Secondary | ICD-10-CM

## 2016-09-24 HISTORY — DX: Paroxysmal atrial fibrillation: I48.0

## 2016-09-25 ENCOUNTER — Ambulatory Visit: Payer: Self-pay | Admitting: Cardiology

## 2016-09-25 ENCOUNTER — Encounter: Payer: Self-pay | Admitting: Cardiology

## 2016-09-25 DIAGNOSIS — I248 Other forms of acute ischemic heart disease: Secondary | ICD-10-CM | POA: Insufficient documentation

## 2016-09-25 DIAGNOSIS — I2489 Other forms of acute ischemic heart disease: Secondary | ICD-10-CM

## 2016-09-25 HISTORY — DX: Other forms of acute ischemic heart disease: I24.89

## 2016-09-25 NOTE — Progress Notes (Deleted)
  Cardiology Office Note:    Date:  09/25/2016   ID:  Dana Horton, DOB [birth year], MRN [id number]  PCP:  No primary care provider on file.  Cardiologist:  Shirlee More, MD ***   Referring MD: Nona Dell, Corene Cornea, MD    ASSESSMENT:    No diagnosis found. PLAN:    In order of problems listed above:  1. ***  Next appointment: ***   Medication Adjustments/Labs and Tests Ordered: Current medicines are reviewed at length with the patient today.  Concerns regarding medicines are outlined above.  No orders of the defined types were placed in this encounter.  No orders of the defined types were placed in this encounter.   No chief complaint on file. ***  History of Present Illness:    Dana Horton is a 71 y.o. female with a hx of PAF, chest pain with normal coronary arteriography 2017 and recent [facility] admission with rapid AF and leevated troponin 1.1 peak. Compliance with diet, lifestyle and medications: *** Past Medical History:  Diagnosis Date  . Arthritis, rheumatoid ([place])   . COPD (chronic obstructive pulmonary disease) ([place])   . Essential hypertension   . Lung nodule   . Sleep apnea     Past Surgical History:  Procedure Laterality Date  . ABDOMINAL HYSTERECTOMY    . BLADDER SURGERY    . MOUTH SURGERY      Current Medications: No outpatient prescriptions have been marked as taking for the 09/25/16 encounter (Appointment) with Richardo Priest, MD.     Allergies:   Patient has no allergy information on record.   Social History   Social History  . Marital status: Unknown    Spouse name: N/A  . Number of children: N/A  . Years of education: N/A   Social History Main Topics  . Smoking status: Current Every Day Smoker  . Smokeless tobacco: Not on file  . Alcohol use Not on file  . Drug use: Unknown  . Sexual activity: Not on file   Other Topics Concern  . Not on file   Social History Narrative  . No narrative on file     Family History: The  patient's ***family history is not on file. ROS:   Please see the history of present illness.    All other systems reviewed and are negative.  EKGs/Labs/Other Studies Reviewed:    The following studies were reviewed today: [place] ED and admission records 09/13/16-09/15/16  EKG:  EKG is *** ordered today.  The ekg ordered today demonstrates ***  Recent Labs: CMP CBC normal, troponin peak 1.1 No results found for requested labs within last 8760 hours.  Recent Lipid Panel No results found for: CHOL, TRIG, HDL, CHOLHDL, VLDL, LDLCALC, LDLDIRECT  Physical Exam:    VS:  There were no vitals taken for this visit.    Wt Readings from Last 3 Encounters:  No data found for Wt     GEN: *** Well nourished, well developed in no acute distress HEENT: Normal NECK: No JVD; No carotid bruits LYMPHATICS: No lymphadenopathy CARDIAC: ***RRR, no murmurs, rubs, gallops RESPIRATORY:  Clear to auscultation without rales, wheezing or rhonchi  ABDOMEN: Soft, non-tender, non-distended MUSCULOSKELETAL:  No edema; No deformity  SKIN: Warm and dry NEUROLOGIC:  Alert and oriented x 3 PSYCHIATRIC:  Normal affect    Signed, Shirlee More, MD  09/25/2016 8:00 AM    [place]

## 2016-10-03 DIAGNOSIS — M7542 Impingement syndrome of left shoulder: Secondary | ICD-10-CM | POA: Diagnosis not present

## 2016-10-03 DIAGNOSIS — M25512 Pain in left shoulder: Secondary | ICD-10-CM | POA: Diagnosis not present

## 2016-10-08 DIAGNOSIS — N3020 Other chronic cystitis without hematuria: Secondary | ICD-10-CM | POA: Diagnosis not present

## 2016-10-08 DIAGNOSIS — C674 Malignant neoplasm of posterior wall of bladder: Secondary | ICD-10-CM | POA: Diagnosis not present

## 2016-10-15 DIAGNOSIS — M25512 Pain in left shoulder: Secondary | ICD-10-CM | POA: Diagnosis not present

## 2016-10-22 DIAGNOSIS — M25512 Pain in left shoulder: Secondary | ICD-10-CM | POA: Diagnosis not present

## 2016-10-24 ENCOUNTER — Telehealth: Payer: Self-pay | Admitting: Cardiovascular Disease

## 2016-10-24 ENCOUNTER — Ambulatory Visit (INDEPENDENT_AMBULATORY_CARE_PROVIDER_SITE_OTHER): Payer: Medicare Other | Admitting: Cardiovascular Disease

## 2016-10-24 ENCOUNTER — Encounter: Payer: Self-pay | Admitting: Cardiovascular Disease

## 2016-10-24 ENCOUNTER — Encounter (INDEPENDENT_AMBULATORY_CARE_PROVIDER_SITE_OTHER): Payer: Self-pay

## 2016-10-24 VITALS — BP 132/74 | HR 53 | Ht 62.0 in | Wt 159.0 lb

## 2016-10-24 DIAGNOSIS — I10 Essential (primary) hypertension: Secondary | ICD-10-CM

## 2016-10-24 DIAGNOSIS — K921 Melena: Secondary | ICD-10-CM | POA: Diagnosis not present

## 2016-10-24 DIAGNOSIS — I480 Paroxysmal atrial fibrillation: Secondary | ICD-10-CM | POA: Diagnosis not present

## 2016-10-24 MED ORDER — METOPROLOL SUCCINATE ER 50 MG PO TB24: 50.0000 mg | ORAL_TABLET | Freq: Every day | ORAL | 3 refills | Status: DC

## 2016-10-24 NOTE — Telephone Encounter (Signed)
Faxed Release signed by patient to [facility] to obtain records per Dr Oval Linsey.  Faxed on 10/24/16. lp

## 2016-10-24 NOTE — Progress Notes (Signed)
Cardiology Office Note   Date:  10/24/2016   ID:  Dana Horton, DOB 05/09/1945, MRN [id number]  PCP:  Dana Roger, MD  Cardiologist:   Dana Latch, MD   Chief Complaint  Patient presents with  . Follow-up    NP  . Chest Pain  . Atrial Fibrillation    History of Present Illness: Dana Horton is a 71 y.o. female with paroxysmal atrial fibrillation, hypertension, COPD and OSA who presents for an evaluation of chest pain.  Dana Horton saw Dr. Vassie Loll Horton on 6/21 and reported chest pain.  At that appointment she was noted to be in atrial fibrillation with a ventricular rate of 140 bpm.  She was referred to [facility] where she reportedly converted back to sinus rhythm when her IV was inserted.  She reportedly had an echocardiogram but is unsure of the results. She was started on metoprolol and they recommended that she take a full strength aspirin. She did this but developed hematochezia. She is always have hematochezia when she tries to take a full strength aspirin or NSAIDS.  She has never had a colonoscopy.  She reports that she has hemorrhoids but is unsure of whether this is because of her weaning.  Since leaving the hospital she has not had any recurrent palpitations area she does report fatigue and often has to take a nap in the afternoon. She denies lower extremity edema, orthopnea, or PND. She has not been exercising regularly but does like to walk for exercise sometimes. She has no exertional symptoms  Dana Horton was admitted to [facility] 09/2015 for chest pain and found to be in atrial fibrillation.  She had a left heart catheterization at that time that reportedly showed normal coronary arteries.  He continues to smoke half pack of cigarettes daily. She has been smoking since age 56. She was able to quit when she was pregnant. She has tried Chantix, Wellbutrin, patches, and gum without success.   Past Medical History:   Diagnosis Date  . Arthritis, rheumatoid ([place])   . COPD (chronic obstructive pulmonary disease) ([place])   . Essential hypertension   . Lung nodule   . Sleep apnea     Past Surgical History:  Procedure Laterality Date  . ABDOMINAL HYSTERECTOMY    . BLADDER SURGERY    . MOUTH SURGERY       Current Outpatient Prescriptions  Medication Sig Dispense Refill  . albuterol (PROVENTIL) (2.5 MG/3ML) 0.083% nebulizer solution Take 2.5 mg by nebulization 4 (four) times daily as needed for wheezing or shortness of breath.    Marland Kitchen aspirin 81 MG tablet Take 81 mg by mouth daily.    . cetirizine (ZYRTEC) 10 MG tablet Take 10 mg by mouth daily.    Marland Kitchen dronedarone (MULTAQ) 400 MG tablet Take 400 mg by mouth 2 (two) times daily with a meal.    . fluticasone (FLONASE) 50 MCG/ACT nasal spray Place 2 sprays into both nostrils daily.    . hydrochlorothiazide (HYDRODIURIL) 25 MG tablet Take 25 mg by mouth daily.    . metoprolol tartrate (LOPRESSOR) 25 MG tablet Take 25 mg by mouth 2 (two) times daily.    . nitrofurantoin, macrocrystal-monohydrate, (MACROBID) 100 MG capsule Take 100 mg by mouth 2 (two) times daily.     No current facility-administered medications for this visit.     Allergies:   Lisinopril    Social History:  The patient  reports that she has been smoking.  She has never used smokeless tobacco.   Family History:  The patient's family history includes Cancer in her brother, father, and mother; Heart disease in her father; Hypertension in her mother; Stroke in her mother.    ROS:  Please see the history of present illness.   Otherwise, review of systems are positive for none.   All other systems are reviewed and negative.    PHYSICAL EXAM: VS:  BP 132/74   Pulse (!) 53   Ht 5\' 2"  (1.575 m)   Wt 72.1 kg (159 lb)   BMI 29.08 kg/m  , BMI Body mass index is 29.08 kg/m. GENERAL:  Well appearing.  No acute distress. HEENT:  Pupils equal round and reactive, fundi not visualized, oral mucosa  unremarkable NECK:  No jugular venous distention, waveform within normal limits, carotid upstroke brisk and symmetric, no bruits, no thyromegaly LUNGS:  Clear to auscultation bilaterally.  No crackles, wheezes, or rhonchi HEART:  RRR.  PMI not displaced or sustained,S1 and S2 within normal limits, no S3, no S4, no clicks, no rubs, no murmurs ABD:  Flat, positive bowel sounds normal in frequency in pitch, no bruits, no rebound, no guarding, no midline pulsatile mass, no hepatomegaly, no splenomegaly EXT:  2 plus pulses throughout, no edema, no cyanosis no clubbing SKIN:  No rashes no nodules NEURO:  Cranial nerves II through XII grossly intact, motor grossly intact throughout PSYCH:  Cognitively intact, oriented to person place and time  EKG:  EKG is ordered today. The ekg ordered 10/24/16 demonstrates sinus bradycardia. Rate 53 bpm.   Recent Labs: No results found for requested labs within last 8760 hours.   03/14/16: WBC 8.1, hemoglobin 13.2, hematocrit 38.9, platelets 195 Sodium 143, potassium 3.6, BUN 18, creatinine 0.88 AST 14, ALT 8 Chol 178, triglycerides 199, HDL 33, LDL 102 TSH 0.83   Lipid Panel No results found for: CHOL, TRIG, HDL, CHOLHDL, VLDL, LDLCALC, LDLDIRECT    Wt Readings from Last 3 Encounters:  10/24/16 72.1 kg (159 lb)      ASSESSMENT AND PLAN:  # Paroxysmal atrial fibrillation:  Dana Horton has had 2 documented episodes of atrial fibrillation. We'll get the records from [facility]. She reportedly has had an echocardiogram and a cardiac catheterization.  If she has not artery had thyroid testing will check this as well.  Her CHA2DS2-VASc Score is at least 3.  She should be on therapeutic anticoagulation but we are unable 2/2 hematochezia.  We will refer her to GI and consider starting anticoagulation if a source if found and treated.  Continue aspirin for now.  We will switch metoprolol to succinate 50mg  qhs to see if this helps with her fatigue.   If not and assuming her echo doesn't show heart failure, we could switch to a CCB.  Continue dronedarone.  This patients CHA2DS2-VASc Score and unadjusted Ischemic Stroke Rate (% per year) is equal to 3.2 % stroke rate/year from a score of 3  Above score calculated as 1 point each if present [CHF, HTN, DM, Vascular=MI/PAD/Aortic Plaque, Age if 65-74, or Female] Above score calculated as 2 points each if present [Age > 75, or Stroke/TIA/TE]  # hypertension: Continue metoprolol.  Current medicines are reviewed at length with the patient today.  The patient does not have concerns regarding medicines.  The following changes have been made:  Switch to metoprolol succinate  Labs/ tests ordered today include:  No orders of the defined types were placed in this encounter.  Disposition:   FU with Lama Narayanan C. Oval Linsey, MD, [facility] in 3 months    This note was written with the assistance of speech recognition software.  Please excuse any transcriptional errors.  Signed, Maly Lemarr C. Oval Linsey, MD, [facility]  10/24/2016 9:50 AM    [place]

## 2016-10-24 NOTE — Telephone Encounter (Signed)
Received records from [facility] as requested by Dr [place]. Records given to Dr [place] for review. lp °

## 2016-10-24 NOTE — Patient Instructions (Addendum)
Medication Instructions:  WHEN YOU FINISH YOUR CURRENT METOPROLOL START THE METOPROLOL SUCC 50 MG ONCE DAILY AT NIGHT  Labwork: NONE  Testing/Procedures: NONE  Follow-Up: Your physician recommends that you schedule a follow-up appointment in: 3 MONTH OV  You have been referred to [facility] AT [phone]  If you need a refill on your cardiac medications before your next appointment, please call your pharmacy.

## 2016-10-29 ENCOUNTER — Encounter: Payer: Self-pay | Admitting: Gastroenterology

## 2016-10-31 ENCOUNTER — Telehealth: Payer: Self-pay | Admitting: *Deleted

## 2016-10-31 ENCOUNTER — Other Ambulatory Visit: Payer: Self-pay

## 2016-10-31 DIAGNOSIS — K921 Melena: Secondary | ICD-10-CM

## 2016-10-31 NOTE — Telephone Encounter (Signed)
Routed note to Almyra Free- to schedule this OV Lelan Pons PV

## 2016-10-31 NOTE — Telephone Encounter (Signed)
Thank you for sending me this message and your diligence reviewing the chart. This is not a patient who needs to see me in clinic due to her recent medical issues and bleeding.    I have time open this Friday AM.  Please coordinate with my nurse (also copied on this) to call this patient and have her come this Friday at 9:15 AM to check in for 9:30 appointment.  Then update the schedule accordingly.  - HD

## 2016-10-31 NOTE — Telephone Encounter (Signed)
Spoke to patient she is aware of appointment on Friday with Dr. Loletha Carrow. I have kept her colonoscopy scheduled for 11/19/16. She will need to get prep instructions at her visit on 11/02/16.

## 2016-10-31 NOTE — Telephone Encounter (Signed)
Dr Loletha Carrow,  I was reviewing this pt's chart for a PV scheduled for 8-14. She has a direct colon scheduled with you for 11-19-16 Monday, referred for hematochezia.  Her history includes 2 episodes of A Fib, first 10-17-2015 where she was admitted to [facility], had a cardiac cath that was normal, and a second episode 09-13-16 with chest pain and the note states she converted with her IV start in the ED.  She had an echo 09-14-2016, pt was in NSR with mild mitral regurg, mild aortic valve sclerosis.  The cardiology note states anticoag needed but pt has hematochezia so did not start any anticoag due to this and referred her to GI for a colon. Pt has no GI history .   Is she okay for a direct colon with you or do you prefer she has an OV.  Please advise and thank you for your time, Marijean Niemann

## 2016-11-02 ENCOUNTER — Encounter: Payer: Self-pay | Admitting: Gastroenterology

## 2016-11-02 ENCOUNTER — Ambulatory Visit (INDEPENDENT_AMBULATORY_CARE_PROVIDER_SITE_OTHER): Payer: Medicare Other | Admitting: Gastroenterology

## 2016-11-02 VITALS — BP 126/68 | HR 69 | Ht 63.0 in | Wt 161.0 lb

## 2016-11-02 DIAGNOSIS — R1013 Epigastric pain: Secondary | ICD-10-CM | POA: Diagnosis not present

## 2016-11-02 DIAGNOSIS — G8929 Other chronic pain: Secondary | ICD-10-CM | POA: Diagnosis not present

## 2016-11-02 DIAGNOSIS — K921 Melena: Secondary | ICD-10-CM | POA: Diagnosis not present

## 2016-11-02 DIAGNOSIS — I480 Paroxysmal atrial fibrillation: Secondary | ICD-10-CM | POA: Diagnosis not present

## 2016-11-02 MED ORDER — SUPREP BOWEL PREP KIT 17.5-3.13-1.6 GM/177ML PO SOLN: ORAL | 0 refills | Status: DC

## 2016-11-02 NOTE — Patient Instructions (Signed)
You have been scheduled for a colonoscopy. Please follow written instructions given to you at your visit today.  Please pick up your prep supplies at the pharmacy within the next 1-3 days. If you use inhalers (even only as needed), please bring them with you on the day of your procedure. Your physician has requested that you go to www.startemmi.com and enter the access code given to you at your visit today. This web site gives a general overview about your procedure. However, you should still follow specific instructions given to you by our office regarding your preparation for the procedure.   If you are age 45 or older, your body mass index should be between 23-30. Your Body mass index is 28.52 kg/m. If this is out of the aforementioned range listed, please consider follow up with your Primary Care Provider.  If you are age 29 or younger, your body mass index should be between 19-25. Your Body mass index is 28.52 kg/m. If this is out of the aformentioned range listed, please consider follow up with your Primary Care Provider.   Thank you for choosing [facility]  Dr Wilfrid Lund III

## 2016-11-02 NOTE — Progress Notes (Signed)
[facility] Consult Note:  History: Dana Horton 11/02/2016  Referring physician: Skeet Latch, M.D.  Reason for consult/chief complaint: Colonoscopy (Pt is scheduled for colonoscopy for rectal bleeding and was recently dx with a fib. )   Subjective  HPI:  This is a 71 year old woman referred by her cardiologist noted above for hematochezia. She reports that for at least several years if she tried to take anything stronger than a baby aspirin or just a few doses of Advil, she would have blood per rectum. He was usually bright red, but sometimes could be darker. She was recently admitted to [facility] with rapid A. fib which converted to sinus rhythm quickly. Records indicate an echocardiogram showing normal ejection fraction and aortic valve sclerosis. She reportedly had normal coronary arteries on a cardiac catheterization according to the cardiology office note and recent primary care note. Her cardiologist would like a workup of the GI bleeding because the plan to put her on [facility] for A. fib. She reports some intermittent epigastric pain for the last several years, it is not clear what workup she may have had done through primary care. She denies nausea, vomiting, early satiety or weight loss. Her bowel habits are regular.   ROS:  Review of Systems no chest pain or palpitations other than revealing the acute episode of A. fib. She reports this recent episode was the second time she had A. Fib. Positive for arthralgias and chronic cough Remainder systems negative except as above in history  Past Medical History: Past Medical History:  Diagnosis Date  . Arthritis, rheumatoid ([place])   . Atrial fibrillation ([place])   . COPD (chronic obstructive pulmonary disease) ([place])   . Essential hypertension   . Lung nodule   . Seasonal allergies   . Sleep apnea   . Tobacco abuse      Past Surgical History: Past Surgical History:  Procedure Laterality  Date  . ABDOMINAL HYSTERECTOMY    . BLADDER SURGERY    . MOUTH SURGERY       Family History: Family History  Problem Relation Age of Onset  . Hypertension Mother   . Stroke Mother   . Cancer Father   . Heart disease Father   . CAD Father   . Arrhythmia Father   . Bladder Cancer Brother   . Lung cancer Brother     Social History: Social History   Social History  . Marital status: Unknown    Spouse name: N/A  . Number of children: N/A  . Years of education: N/A   Social History Main Topics  . Smoking status: Current Every Day Smoker  . Smokeless tobacco: Never Used     Comment: 1/2 pack a day  . Alcohol use None  . Drug use: Unknown  . Sexual activity: Not Asked   Other Topics Concern  . None   Social History Narrative  . None    Allergies: Allergies  Allergen Reactions  . Lisinopril Cough    Outpatient Meds: Current Outpatient Prescriptions  Medication Sig Dispense Refill  . albuterol (PROVENTIL) (2.5 MG/3ML) 0.083% nebulizer solution Take 2.5 mg by nebulization 4 (four) times daily as needed for wheezing or shortness of breath.    Marland Kitchen aspirin 81 MG tablet Take 81 mg by mouth daily.    . cetirizine (ZYRTEC) 10 MG tablet Take 10 mg by mouth daily.    Marland Kitchen dronedarone (MULTAQ) 400 MG tablet Take 400 mg by mouth 2 (two) times daily with a meal.    .  fluticasone (FLONASE) 50 MCG/ACT nasal spray Place 2 sprays into both nostrils daily.    . hydrochlorothiazide (HYDRODIURIL) 25 MG tablet Take 25 mg by mouth daily.    . metoprolol succinate (TOPROL-XL) 25 MG 24 hr tablet Take 25 mg by mouth daily with breakfast.    . metoprolol succinate (TOPROL-XL) 50 MG 24 hr tablet Take 1 tablet (50 mg total) by mouth at bedtime. Take with or immediately following a meal. 90 tablet 3  . nitrofurantoin, macrocrystal-monohydrate, (MACROBID) 100 MG capsule Take 100 mg by mouth 2 (two) times daily.    Manus Gunning BOWEL PREP KIT 17.5-3.13-1.6 GM/180ML SOLN Suprep-Use as directed 354 mL 0     No current facility-administered medications for this visit.       ___________________________________________________________________ Objective   Exam:  BP 126/68 (BP Location: Left Arm, Patient Position: Sitting, Cuff Size: Normal)   Pulse 69   Ht _0  (1.6 m)   Wt 161 lb (73 kg)   BMI 28.52 kg/m    General: this is a(n) Well-appearing woman   Eyes: sclera anicteric, no redness  ENT: oral mucosa moist without lesions, no cervical or supraclavicular lymphadenopathy, good dentition  CV: RRR with 3/6 systolic murmur. no JVD, no peripheral edema  Resp: End expiratory wheezing bilaterally, normal RR and effort noted  GI: soft, no tenderness, with active bowel sounds. No guarding or palpable organomegaly noted.  Skin; warm and dry, no rash or jaundice noted  Neuro: awake, alert and oriented x 3. Normal gross motor function and fluent speech  Labs:  No recent labs  Cardiac echo report from [facility] scanned into Epic chart was reviewed and noted above.  Assessment: Encounter Diagnoses  Name Primary?  . Hematochezia Yes  . Abdominal pain, chronic, epigastric   . PAF (paroxysmal atrial fibrillation) (HCC)     It sounds most likely to be lower GI bleeding, perhaps hemorrhoidal. However, she describes some intermittent darker blood and chronic epigastric pain.  Plan:  EGD and colonoscopy  The benefits and risks of the planned procedure were described in detail with the patient or (when appropriate) their health care proxy.  Risks were outlined as including, but not limited to, bleeding, infection, perforation, adverse medication reaction leading to cardiac or pulmonary decompensation, or pancreatitis (if ERCP).  The limitation of incomplete mucosal visualization was also discussed.  No guarantees or warranties were given.  Hopeful identification and treatment of bleeding source prior to starting anticoagulation.  Thank you for the courtesy of this consult.   Please call me with any questions or concerns.  [name]  CC: Townsend Roger, MD Skeet Latch, M.D.  [facility]

## 2016-11-12 ENCOUNTER — Telehealth: Payer: Self-pay | Admitting: Gastroenterology

## 2016-11-12 ENCOUNTER — Ambulatory Visit (AMBULATORY_SURGERY_CENTER): Payer: Medicare Other | Admitting: Gastroenterology

## 2016-11-12 ENCOUNTER — Encounter: Payer: Self-pay | Admitting: Gastroenterology

## 2016-11-12 VITALS — BP 102/44 | HR 59 | Temp 98.0°F | Resp 17 | Ht 63.0 in | Wt 161.0 lb

## 2016-11-12 DIAGNOSIS — K921 Melena: Secondary | ICD-10-CM | POA: Diagnosis not present

## 2016-11-12 DIAGNOSIS — D120 Benign neoplasm of cecum: Secondary | ICD-10-CM | POA: Diagnosis not present

## 2016-11-12 DIAGNOSIS — I4891 Unspecified atrial fibrillation: Secondary | ICD-10-CM | POA: Diagnosis not present

## 2016-11-12 DIAGNOSIS — R1013 Epigastric pain: Secondary | ICD-10-CM | POA: Diagnosis not present

## 2016-11-12 DIAGNOSIS — D122 Benign neoplasm of ascending colon: Secondary | ICD-10-CM

## 2016-11-12 DIAGNOSIS — J449 Chronic obstructive pulmonary disease, unspecified: Secondary | ICD-10-CM | POA: Diagnosis not present

## 2016-11-12 MED ORDER — SODIUM CHLORIDE 0.9 % IV SOLN: 500.0000 mL | INTRAVENOUS | Status: DC

## 2016-11-12 NOTE — Op Note (Signed)
[place] Patient Name: Dana Horton Procedure Date: 11/12/2016 2:04 PM MRN: [id number] Endoscopist: Mallie Mussel L. Loletha Carrow , MD Age: 71 Referring MD:  Date of Birth: Aug 14, 1945 Gender: Female Account #: [id number] Procedure:                Upper GI endoscopy Indications:              Epigastric abdominal pain, Hematochezia Medicines:                Monitored Anesthesia Care Procedure:                Pre-Anesthesia Assessment:                           - Prior to the procedure, a History and Physical                            was performed, and patient medications and                            allergies were reviewed. The patient's tolerance of                            previous anesthesia was also reviewed. The risks                            and benefits of the procedure and the sedation                            options and risks were discussed with the patient.                            All questions were answered, and informed consent                            was obtained. Prior Anticoagulants: The patient has                            taken no previous anticoagulant or antiplatelet                            agents. ASA Grade Assessment: III - A patient with                            severe systemic disease. After reviewing the risks                            and benefits, the patient was deemed in                            satisfactory condition to undergo the procedure.                           After obtaining informed consent, the endoscope was  passed under direct vision. Throughout the                            procedure, the patient's blood pressure, pulse, and                            oxygen saturations were monitored continuously. The                            Endoscope was introduced through the mouth, and                            advanced to the second part of duodenum. The upper                            GI  endoscopy was accomplished without difficulty.                            The patient tolerated the procedure well. Scope In: Scope Out: Findings:                 The esophagus was normal.                           Patchy mildly erythematous mucosa without bleeding                            was found in the gastric antrum.                           The cardia and gastric fundus were normal on                            retroflexion.                           The examined duodenum was normal. Complications:            No immediate complications. Estimated Blood Loss:     Estimated blood loss: none. Impression:               - Normal esophagus.                           - Erythematous mucosa in the antrum.                           - Normal examined duodenum.                           - No specimens collected. Recommendation:           - Patient has a contact number available for                            emergencies. The signs and symptoms of potential  delayed complications were discussed with the                            patient. Return to normal activities tomorrow.                            Written discharge instructions were provided to the                            patient.                           - Resume previous diet.                           - Continue present medications.                           - See the other procedure note for documentation of                            additional recommendations. Henry L. Loletha Carrow, MD 11/12/2016 2:48:29 PM This report has been signed electronically. CC Letter to:             Skeet Latch, MD

## 2016-11-12 NOTE — Telephone Encounter (Signed)
Dana Horton,    I saw this patient for a colonoscopy in the [place] today.  She needs to be scheduled for a repeat colonoscopy (Suprep - please give sample if we have any) at the [facility] outpatient endoscopy lab for my Oct 5th block.  I need a one hour block for the procedure for polypectomy, and I want to be sure they will have Elevue available.  It is a product used to inject and lift the polyp before removal.  I think they should keep it in stock, but please call to be sure.   I would like her to be off aspirin 5 days prior to procedure as well. Thanks.  - HD

## 2016-11-12 NOTE — Progress Notes (Signed)
Report given to PACU, vss 

## 2016-11-12 NOTE — Op Note (Signed)
[place] Patient Name: Dana Horton Procedure Date: 11/12/2016 2:04 PM MRN: [id number] Endoscopist: Mallie Mussel L. Loletha Carrow , MD Age: 71 Referring MD:  Date of Birth: 1945/06/06 Gender: Female Account #: [id number] Procedure:                Colonoscopy Indications:              Hematochezia (small volumne, and only when takes                            full strength aspirin) Medicines:                Monitored Anesthesia Care Procedure:                Pre-Anesthesia Assessment:                           - Prior to the procedure, a History and Physical                            was performed, and patient medications and                            allergies were reviewed. The patient's tolerance of                            previous anesthesia was also reviewed. The risks                            and benefits of the procedure and the sedation                            options and risks were discussed with the patient.                            All questions were answered, and informed consent                            was obtained. Prior Anticoagulants: The patient has                            taken no previous anticoagulant or antiplatelet                            agents. ASA Grade Assessment: III - A patient with                            severe systemic disease. After reviewing the risks                            and benefits, the patient was deemed in                            satisfactory condition to undergo the procedure.                           -  Prior to the procedure, a History and Physical                            was performed, and patient medications and                            allergies were reviewed. The patient's tolerance of                            previous anesthesia was also reviewed. The risks                            and benefits of the procedure and the sedation                            options and risks were discussed with  the patient.                            All questions were answered, and informed consent                            was obtained. Anticoagulants: The patient has taken                            aspirin. It was decided not to withhold this                            medication prior to the procedure. ASA Grade                            Assessment: III - A patient with severe systemic                            disease. After reviewing the risks and benefits,                            the patient was deemed in satisfactory condition to                            undergo the procedure.                           After obtaining informed consent, the colonoscope                            was passed under direct vision. Throughout the                            procedure, the patient's blood pressure, pulse, and                            oxygen saturations were monitored continuously. The  Model PCF-[id number]) scope was introduced                            through the anus and advanced to the the cecum,                            identified by appendiceal orifice and ileocecal                            valve. The colonoscopy was performed without                            difficulty. The patient tolerated the procedure                            well. The quality of the bowel preparation was                            good. The ileocecal valve, appendiceal orifice, and                            rectum were photographed. The quality of the bowel                            preparation was evaluated using the BBPS [facility]                            Bowel Preparation Scale) with scores of: Right                            Colon = 2, Transverse Colon = 2 and Left Colon = 2.                            The total BBPS score equals 6. The bowel                            preparation used was SUPREP. Scope In: 2:18:00 PM Scope Out: 2:40:53 PM Total Procedure  Duration: 0 hours 22 minutes 53 seconds  Findings:                 The digital rectal exam findings include decreased                            sphincter tone.                           A 2 mm polyp was found in the cecum. The polyp was                            sessile. The polyp was removed with a cold snare.                            Resection and retrieval were complete.  Many large-mouthed diverticula were found in the                            entire colon.                           A 12 mm polyp was found in the proximal ascending                            colon. The polyp was sessile (probable SSP by                            WL&NBI appearance). Piecemeal polypectomy was                            attempted with a hot snare. No tissue was                            completely resected, so the snare was disengaged                            from the partially-resected tissue. Polyp resection                            was unsuccessful due to the polypectomy being                            technically difficult and complex. To prevent                            bleeding post-intervention, two hemostatic clips                            were successfully placed.                           A 10 mm polyp was found in the sigmoid colon. The                            polyp was pedunculated. Polypectomy was not                            attempted due to repeat colonoscopy planned.                           Retroflexion in the rectum was not performed due to                            anatomy. Complications:            No immediate complications. Estimated Blood Loss:     Estimated blood loss was minimal. Impression:               - Decreased sphincter tone found on digital rectal  exam.                           - One 2 mm polyp in the cecum, removed with a cold                            snare. Resected and retrieved.                            - Diverticulosis in the entire examined colon.                           - One 12 mm polyp in the proximal ascending colon.                            Unsuccessful polyp resection. Clips were placed.                           - One 10 mm polyp in the sigmoid colon. Resection                            not attempted.                           No obvious source of reported bleeding. Suspect                            intermittent benign anal bleeding. Recommendation:           - Patient has a contact number available for                            emergencies. The signs and symptoms of potential                            delayed complications were discussed with the                            patient. Return to normal activities tomorrow.                            Written discharge instructions were provided to the                            patient.                           - Resume previous diet.                           - Continue present medications.                           - Repeat colonoscopy polypectomy with lifting and  alternate snare technique at hospital endoscopy lab. Joannah Gitlin L. Loletha Carrow, MD 11/12/2016 3:00:00 PM This report has been signed electronically.

## 2016-11-12 NOTE — Progress Notes (Signed)
Called to room to assist during endoscopic procedure.  Patient ID and intended procedure confirmed with present staff. Received instructions for my participation in the procedure from the performing physician.  

## 2016-11-12 NOTE — Patient Instructions (Signed)
**  Handouts given on polyps and a clip card was issued**   YOU HAD AN ENDOSCOPIC PROCEDURE TODAY: Refer to the procedure report and other information in the discharge instructions given to you for any specific questions about what was found during the examination. If this information does not answer your questions, please call [place] office at [phone] to clarify.   YOU SHOULD EXPECT: Some feelings of bloating in the abdomen. Passage of more gas than usual. Walking can help get rid of the air that was put into your GI tract during the procedure and reduce the bloating. If you had a lower endoscopy (such as a colonoscopy or flexible sigmoidoscopy) you may notice spotting of blood in your stool or on the toilet paper. Some abdominal soreness may be present for a day or two, also.  DIET: Your first meal following the procedure should be a light meal and then it is ok to progress to your normal diet. A half-sandwich or bowl of soup is an example of a good first meal. Heavy or fried foods are harder to digest and may make you feel nauseous or bloated. Drink plenty of fluids but you should avoid alcoholic beverages for 24 hours. If you had a esophageal dilation, please see attached instructions for diet.    ACTIVITY: Your care partner should take you home directly after the procedure. You should plan to take it easy, moving slowly for the rest of the day. You can resume normal activity the day after the procedure however YOU SHOULD NOT DRIVE, use power tools, machinery or perform tasks that involve climbing or major physical exertion for 24 hours (because of the sedation medicines used during the test).   SYMPTOMS TO REPORT IMMEDIATELY: A gastroenterologist can be reached at any hour. Please call [phone]  for any of the following symptoms:  Following lower endoscopy (colonoscopy, flexible sigmoidoscopy) Excessive amounts of blood in the stool  Significant tenderness, worsening of abdominal pains   Swelling of the abdomen that is new, acute  Fever of 100 or higher  Following upper endoscopy (EGD, EUS, ERCP, esophageal dilation) Vomiting of blood or coffee ground material  New, significant abdominal pain  New, significant chest pain or pain under the shoulder blades  Painful or persistently difficult swallowing  New shortness of breath  Black, tarry-looking or red, bloody stools  FOLLOW UP:  If any biopsies were taken you will be contacted by phone or by letter within the next 1-3 weeks. Call [phone]  if you have not heard about the biopsies in 3 weeks.  Please also call with any specific questions about appointments or follow up tests.

## 2016-11-13 ENCOUNTER — Telehealth: Payer: Self-pay

## 2016-11-13 ENCOUNTER — Other Ambulatory Visit: Payer: Self-pay

## 2016-11-13 DIAGNOSIS — K635 Polyp of colon: Secondary | ICD-10-CM

## 2016-11-13 MED ORDER — NA SULFATE-K SULFATE-MG SULF 17.5-3.13-1.6 GM/177ML PO SOLN: 1.0000 | ORAL | 0 refills | Status: DC

## 2016-11-13 NOTE — Telephone Encounter (Signed)
Spoke to patient, she is aware of the scheduled colonoscopy on 10/5 at [facility]. Mailed prep instructions. She asked that I send the Rx for the prep to her pharmacy in [place].

## 2016-11-13 NOTE — Telephone Encounter (Signed)
  Follow up Call-  Call back number 11/12/2016  Post procedure Call Back phone  # [phone]  Permission to leave phone message Yes  Some recent data might be hidden     Patient questions:  Do you have a fever, pain , or abdominal swelling? No. Pain Score  0 *  Have you tolerated food without any problems? Yes.    Have you been able to return to your normal activities? Yes.    Do you have any questions about your discharge instructions: Diet   No. Medications  No. Follow up visit  No.  Do you have questions or concerns about your Care? No.  Actions: * If pain score is 4 or above: No action needed, pain <4.

## 2016-11-19 ENCOUNTER — Encounter: Payer: Medicare Other | Admitting: Gastroenterology

## 2016-12-26 ENCOUNTER — Encounter (HOSPITAL_COMMUNITY): Payer: Self-pay | Admitting: *Deleted

## 2016-12-28 ENCOUNTER — Ambulatory Visit (HOSPITAL_COMMUNITY): Payer: Medicare Other | Admitting: Certified Registered Nurse Anesthetist

## 2016-12-28 ENCOUNTER — Ambulatory Visit (HOSPITAL_COMMUNITY)
Admission: RE | Admit: 2016-12-28 | Discharge: 2016-12-28 | Disposition: A | Discharge status: Home / Self Care | Payer: Medicare Other | Source: Ambulatory Visit | Attending: Gastroenterology | Admitting: Gastroenterology

## 2016-12-28 ENCOUNTER — Encounter (HOSPITAL_COMMUNITY): Payer: Self-pay | Admitting: *Deleted

## 2016-12-28 ENCOUNTER — Encounter (HOSPITAL_COMMUNITY)
Admission: RE | Disposition: A | Discharge status: Home / Self Care | Payer: Self-pay | Source: Ambulatory Visit | Attending: Gastroenterology

## 2016-12-28 DIAGNOSIS — K640 First degree hemorrhoids: Secondary | ICD-10-CM | POA: Insufficient documentation

## 2016-12-28 DIAGNOSIS — I252 Old myocardial infarction: Secondary | ICD-10-CM | POA: Insufficient documentation

## 2016-12-28 DIAGNOSIS — Z9989 Dependence on other enabling machines and devices: Secondary | ICD-10-CM | POA: Insufficient documentation

## 2016-12-28 DIAGNOSIS — G4730 Sleep apnea, unspecified: Secondary | ICD-10-CM | POA: Insufficient documentation

## 2016-12-28 DIAGNOSIS — R011 Cardiac murmur, unspecified: Secondary | ICD-10-CM | POA: Insufficient documentation

## 2016-12-28 DIAGNOSIS — D125 Benign neoplasm of sigmoid colon: Secondary | ICD-10-CM | POA: Diagnosis not present

## 2016-12-28 DIAGNOSIS — I10 Essential (primary) hypertension: Secondary | ICD-10-CM | POA: Diagnosis not present

## 2016-12-28 DIAGNOSIS — Z888 Allergy status to other drugs, medicaments and biological substances status: Secondary | ICD-10-CM | POA: Diagnosis not present

## 2016-12-28 DIAGNOSIS — I4891 Unspecified atrial fibrillation: Secondary | ICD-10-CM | POA: Diagnosis not present

## 2016-12-28 DIAGNOSIS — Z7982 Long term (current) use of aspirin: Secondary | ICD-10-CM | POA: Diagnosis not present

## 2016-12-28 DIAGNOSIS — M810 Age-related osteoporosis without current pathological fracture: Secondary | ICD-10-CM | POA: Diagnosis not present

## 2016-12-28 DIAGNOSIS — Z79899 Other long term (current) drug therapy: Secondary | ICD-10-CM | POA: Diagnosis not present

## 2016-12-28 DIAGNOSIS — K635 Polyp of colon: Secondary | ICD-10-CM | POA: Diagnosis not present

## 2016-12-28 DIAGNOSIS — Z8551 Personal history of malignant neoplasm of bladder: Secondary | ICD-10-CM | POA: Insufficient documentation

## 2016-12-28 DIAGNOSIS — Z90710 Acquired absence of both cervix and uterus: Secondary | ICD-10-CM | POA: Diagnosis not present

## 2016-12-28 DIAGNOSIS — M069 Rheumatoid arthritis, unspecified: Secondary | ICD-10-CM | POA: Diagnosis not present

## 2016-12-28 DIAGNOSIS — I480 Paroxysmal atrial fibrillation: Secondary | ICD-10-CM | POA: Diagnosis not present

## 2016-12-28 DIAGNOSIS — J449 Chronic obstructive pulmonary disease, unspecified: Secondary | ICD-10-CM | POA: Diagnosis not present

## 2016-12-28 DIAGNOSIS — K5790 Diverticulosis of intestine, part unspecified, without perforation or abscess without bleeding: Secondary | ICD-10-CM | POA: Diagnosis not present

## 2016-12-28 DIAGNOSIS — D122 Benign neoplasm of ascending colon: Secondary | ICD-10-CM | POA: Insufficient documentation

## 2016-12-28 DIAGNOSIS — K5730 Diverticulosis of large intestine without perforation or abscess without bleeding: Secondary | ICD-10-CM

## 2016-12-28 HISTORY — PX: POLYPECTOMY: SHX5525

## 2016-12-28 HISTORY — DX: Acute myocardial infarction, unspecified: I21.9

## 2016-12-28 HISTORY — DX: Cardiac murmur, unspecified: R01.1

## 2016-12-28 HISTORY — PX: COLONOSCOPY: SHX5424

## 2016-12-28 HISTORY — DX: Pneumonia, unspecified organism: J18.9

## 2016-12-28 SURGERY — COLONOSCOPY
Anesthesia: Monitor Anesthesia Care

## 2016-12-28 MED ORDER — PROPOFOL 500 MG/50ML IV EMUL
INTRAVENOUS | Status: DC | PRN
  Administered 2016-12-28: 125 ug/kg/min via INTRAVENOUS

## 2016-12-28 MED ORDER — PHENYLEPHRINE 40 MCG/ML (10ML) SYRINGE FOR IV PUSH (FOR BLOOD PRESSURE SUPPORT)
PREFILLED_SYRINGE | INTRAVENOUS | Status: DC | PRN
  Administered 2016-12-28: 80 ug via INTRAVENOUS

## 2016-12-28 MED ORDER — MIDAZOLAM HCL 2 MG/2ML IJ SOLN: 0.5000 mg | Freq: Once | INTRAMUSCULAR | Status: DC | PRN

## 2016-12-28 MED ORDER — PROPOFOL 10 MG/ML IV BOLUS
INTRAVENOUS | Status: DC | PRN
  Administered 2016-12-28: 80 mg via INTRAVENOUS

## 2016-12-28 MED ORDER — LIDOCAINE 2% (20 MG/ML) 5 ML SYRINGE
INTRAMUSCULAR | Status: DC | PRN
  Administered 2016-12-28: 100 mg via INTRAVENOUS

## 2016-12-28 MED ORDER — LIDOCAINE 2% (20 MG/ML) 5 ML SYRINGE
INTRAMUSCULAR | Status: AC
  Filled 2016-12-28: qty 5

## 2016-12-28 MED ORDER — SODIUM CHLORIDE 0.9 % IV SOLN: INTRAVENOUS | Status: DC

## 2016-12-28 MED ORDER — PROPOFOL 10 MG/ML IV BOLUS
INTRAVENOUS | Status: AC
  Filled 2016-12-28: qty 60

## 2016-12-28 MED ORDER — MEPERIDINE HCL 25 MG/ML IJ SOLN: 6.2500 mg | INTRAMUSCULAR | Status: DC | PRN

## 2016-12-28 MED ORDER — PROMETHAZINE HCL 25 MG/ML IJ SOLN: 6.2500 mg | INTRAMUSCULAR | Status: DC | PRN

## 2016-12-28 MED ORDER — LACTATED RINGERS IV SOLN
INTRAVENOUS | Status: DC
  Administered 2016-12-28: 09:00:00 via INTRAVENOUS
  Administered 2016-12-28: 1000 mL via INTRAVENOUS

## 2016-12-28 NOTE — Anesthesia Postprocedure Evaluation (Addendum)
Anesthesia Post Note  Patient: Dana Horton  Procedure(s) Performed: COLONOSCOPY (N/A ) POLYPECTOMY with injection of Elevue (N/A )     Patient location during evaluation: Endoscopy Anesthesia Type: MAC Level of consciousness: awake and alert, patient cooperative and oriented Pain management: pain level controlled Vital Signs Assessment: post-procedure vital signs reviewed and stable Respiratory status: spontaneous breathing, nonlabored ventilation and respiratory function stable Cardiovascular status: blood pressure returned to baseline and stable Postop Assessment: no apparent nausea or vomiting Anesthetic complications: no    Last Vitals:  Vitals:   12/28/16 1030 12/28/16 1031  BP: (!) 100/37 (!) 100/37  Pulse: 64   Resp: 13   Temp:    SpO2: 96%     Last Pain:  Vitals:   12/28/16 0928  TempSrc: Oral                 Lisandro Meggett,E. Annina Piotrowski

## 2016-12-28 NOTE — Transfer of Care (Signed)
Immediate Anesthesia Transfer of Care Note  Patient: Dana Horton  Procedure(s) Performed: COLONOSCOPY (N/A ) POLYPECTOMY with injection of Elevue (N/A )  Patient Location: PACU and Endoscopy Unit  Anesthesia Type:MAC  Level of Consciousness: awake, alert  and oriented  Airway & Oxygen Therapy: Patient Spontanous Breathing  Post-op Assessment: Report given to RN and Post -op Vital signs reviewed and stable  Post vital signs: Reviewed and stable  Last Vitals:  Vitals:   12/28/16 0721 12/28/16 0928  BP: (!) 104/51 93/67  Pulse: 62 66  Resp: 13 17  Temp: 36.5 C   SpO2: 99% 90%    Last Pain:  Vitals:   12/28/16 0721  TempSrc: Oral         Complications: No apparent anesthesia complications

## 2016-12-28 NOTE — Op Note (Signed)
[facility] Patient Name: Dana Horton Procedure Date: 12/28/2016 MRN: [id number] Attending MD: Estill Cotta. Loletha Carrow , MD Date of Birth: Aug 07, 1945 CSN: [id number] Age: 71 Admit Type: Outpatient Procedure:                Colonoscopy Indications:              For therapy of colon polyps Providers:                Mallie Mussel L. Loletha Carrow, MD, Laverta Baltimore RN, RN,                            Alan Mulder, Technician Referring MD:              Medicines:                Monitored Anesthesia Care Complications:            No immediate complications. Estimated Blood Loss:     Estimated blood loss: none. Procedure:                Pre-Anesthesia Assessment:                           - Prior to the procedure, a History and Physical                            was performed, and patient medications and                            allergies were reviewed. The patient's tolerance of                            previous anesthesia was also reviewed. The risks                            and benefits of the procedure and the sedation                            options and risks were discussed with the patient.                            All questions were answered, and informed consent                            was obtained. Prior Anticoagulants: The patient has                            taken no previous anticoagulant or antiplatelet                            agents. ASA Grade Assessment: II - A patient with                            mild systemic disease. After reviewing the risks  and benefits, the patient was deemed in                            satisfactory condition to undergo the procedure.                           After obtaining informed consent, the colonoscope                            was passed under direct vision. Throughout the                            procedure, the patient's blood pressure, pulse, and                            oxygen  saturations were monitored continuously. The                            EC-[id number] ([id number]) scope was introduced through                            the anus and advanced to the the cecum, identified                            by appendiceal orifice and ileocecal valve. The                            colonoscopy was performed without difficulty. The                            patient tolerated the procedure well. The quality                            of the bowel preparation was fair. The ileocecal                            valve, appendiceal orifice, and rectum were                            photographed. The quality of the bowel preparation                            was evaluated using the BBPS [facility] Bowel                            Preparation Scale) with scores of: Right Colon = 1,                            Transverse Colon = 2 and Left Colon = 2. The total                            BBPS score equals 5. The bowel preparation used was  SUPREP. Scope In: 8:57:10 AM Scope Out: 9:18:34 AM Scope Withdrawal Time: 0 hours 17 minutes 7 seconds  Total Procedure Duration: 0 hours 21 minutes 24 seconds  Findings:      The perianal and digital rectal examinations were normal.      Diverticula were found in the entire colon.      An 8-10 mm polyp was found in the proximal ascending colon. The polyp       was sessile (probable SSp by appearance). The polyp was removed with a       hot snare. Resection and retrieval were complete.      A 8 mm polyp was found in the mid sigmoid colon. The polyp was sessile.       The polyp was removed with a hot snare. Resection and retrieval were       complete.      Retroflexion in the rectum was not performed due to anatomy.      Internal hemorrhoids were found. The hemorrhoids were small and Grade I       (internal hemorrhoids that do not prolapse).      The exam was otherwise without abnormality. Impression:               -  Preparation of the colon was fair.                           - Diverticulosis in the entire examined colon.                           - One 8-10 mm polyp in the proximal ascending                            colon, removed with a hot snare. Resected and                            retrieved.                           - One 8 mm polyp in the mid sigmoid colon, removed                            with a hot snare. Resected and retrieved.                           - Internal hemorrhoids.                           - The examination was otherwise normal. Moderate Sedation:      MAC sedation used Recommendation:           - Patient has a contact number available for                            emergencies. The signs and symptoms of potential                            delayed complications were discussed with the  patient. Return to normal activities tomorrow.                            Written discharge instructions were provided to the                            patient.                           - Resume previous diet.                           - Continue present medications.                           - No aspirin, ibuprofen, naproxen, or other                            non-steroidal anti-inflammatory drugs for 5 days                            after polyp removal.                           - Await pathology results.                           - Repeat colonoscopy is recommended for                            surveillance. The colonoscopy date will be                            determined after pathology results from today's                            exam become available for review.                           - Return to cardiologist to discuss starting oral                            anticoagulation for A fib.                           Anticoagulation could start 2 weeks from now to                            allow time for polypectomy site to heal. Procedure  Code(s):        --- Professional ---                           [phone], Colonoscopy, flexible; with removal of                            tumor(s), polyp(s), or other lesion(s) by snare  technique Diagnosis Code(s):        --- Professional ---                           K64.0, First degree hemorrhoids                           D12.2, Benign neoplasm of ascending colon                           D12.5, Benign neoplasm of sigmoid colon                           K63.5, Polyp of colon                           K57.30, Diverticulosis of large intestine without                            perforation or abscess without bleeding CPT copyright 2016 American Medical Association. All rights reserved. The codes documented in this report are preliminary and upon coder review may  be revised to meet current compliance requirements. Henry L. Loletha Carrow, MD 12/28/2016 9:32:27 AM This report has been signed electronically. Number of Addenda: 0

## 2016-12-28 NOTE — Discharge Instructions (Signed)

## 2016-12-28 NOTE — H&P (Signed)
History:  This patient presents for endoscopic testing for colon polyp.  Earnest Bailey Befort Referring physician: Townsend Roger, MD  Past Medical History: Past Medical History:  Diagnosis Date  . Allergy   . Arthritis, rheumatoid ([place])   . Atrial fibrillation ([place])   . Cancer ([place])    bladder  . COPD (chronic obstructive pulmonary disease) ([place])   . Essential hypertension   . Heart murmur   . Lung nodule   . Myocardial infarction [facility])    ? small with atrial fib   . Osteoporosis   . Pneumonia    hx of   . Seasonal allergies   . Sleep apnea    cpap  setting at 2.5 per patient   . Tobacco abuse      Past Surgical History: Past Surgical History:  Procedure Laterality Date  . ABDOMINAL HYSTERECTOMY    . BLADDER SURGERY    . MOUTH SURGERY      Allergies: Allergies  Allergen Reactions  . Lisinopril Cough    Outpatient Meds: Current Facility-Administered Medications  Medication Dose Route Frequency Provider Last Rate Last Dose  . 0.9 %  sodium chloride infusion   Intravenous Continuous Danis, Estill Cotta III, MD      . lactated ringers infusion   Intravenous Continuous [name], MD 10 mL/hr at 12/28/16 0725 1,000 mL at 12/28/16 0725      ___________________________________________________________________ Objective   Exam:  BP (!) 104/51   Pulse 62   Temp 97.7 F (36.5 C) (Oral)   Resp 13   Ht 5\' 3"  (1.6 m)   Wt 160 lb (72.6 kg)   LMP  (LMP Unknown)   SpO2 99%   BMI 28.34 kg/m    CV: RRR without murmur, S1/S2, no JVD, no peripheral edema  Resp: clear to auscultation bilaterally, normal RR and effort noted  GI: soft, no tenderness, with active bowel sounds. No guarding or palpable organomegaly noted.  Neuro: awake, alert and oriented x 3. Normal gross motor function and fluent speech   Assessment:  Colon polyp  Plan:  colonoscopy   [name]

## 2016-12-28 NOTE — Anesthesia Preprocedure Evaluation (Addendum)
Anesthesia Evaluation  Patient identified by MRN, date of birth, ID band Patient awake    Reviewed: Allergy & Precautions, NPO status , Patient's Chart, lab work & pertinent test results, reviewed documented beta blocker date and time   History of Anesthesia Complications Negative for: history of anesthetic complications  Airway Mallampati: II  TM Distance: >3 FB Neck ROM: Full    Dental  (+) Edentulous Upper, Edentulous Lower   Pulmonary sleep apnea and Continuous Positive Airway Pressure Ventilation , COPD,  COPD inhaler, Current Smoker,    breath sounds clear to auscultation       Cardiovascular hypertension, Pt. on medications and Pt. on home beta blockers (-) angina+ dysrhythmias Atrial Fibrillation  Rhythm:Regular Rate:Normal  6/18 ECHO: EF 60-65%, mild MR, mild TR   Neuro/Psych negative neurological ROS     GI/Hepatic negative GI ROS, Neg liver ROS,   Endo/Other  negative endocrine ROS  Renal/GU negative Renal ROS     Musculoskeletal  (+) Arthritis , Rheumatoid disorders,    Abdominal   Peds  Hematology negative hematology ROS (+)   Anesthesia Other Findings   Reproductive/Obstetrics                            Anesthesia Physical Anesthesia Plan  ASA: III  Anesthesia Plan: MAC   Post-op Pain Management:    Induction: Intravenous  PONV Risk Score and Plan: 1 and Treatment may vary due to age or medical condition  Airway Management Planned: Natural Airway and Simple Face Mask  Additional Equipment:   Intra-op Plan:   Post-operative Plan:   Informed Consent: I have reviewed the patients History and Physical, chart, labs and discussed the procedure including the risks, benefits and alternatives for the proposed anesthesia with the patient or authorized representative who has indicated his/her understanding and acceptance.   Dental advisory given  Plan Discussed with:  CRNA and Surgeon  Anesthesia Plan Comments: (Plan routine monitors, MAC)        Anesthesia Quick Evaluation

## 2016-12-28 NOTE — Addendum Note (Signed)
Addendum  created 12/28/16 1206 by Annye Asa, MD   Sign clinical note

## 2016-12-28 NOTE — Interval H&P Note (Signed)
History and Physical Interval Note:  12/28/2016 8:34 AM  Dana Horton  has presented today for surgery, with the diagnosis of Colon polyps  The various methods of treatment have been discussed with the patient and family. After consideration of risks, benefits and other options for treatment, the patient has consented to  Procedure(s): COLONOSCOPY (N/A) POLYPECTOMY with injection of Elevue (N/A) as a surgical intervention .  The patient's history has been reviewed, patient examined, no change in status, stable for surgery.  I have reviewed the patient's chart and labs.  Questions were answered to the patient's satisfaction.     [name]

## 2016-12-31 ENCOUNTER — Encounter (HOSPITAL_COMMUNITY): Payer: Self-pay | Admitting: Gastroenterology

## 2017-01-01 ENCOUNTER — Encounter: Payer: Self-pay | Admitting: Gastroenterology

## 2017-01-08 DIAGNOSIS — C674 Malignant neoplasm of posterior wall of bladder: Secondary | ICD-10-CM | POA: Diagnosis not present

## 2017-01-08 DIAGNOSIS — N3020 Other chronic cystitis without hematuria: Secondary | ICD-10-CM | POA: Diagnosis not present

## 2017-01-08 DIAGNOSIS — Z23 Encounter for immunization: Secondary | ICD-10-CM | POA: Diagnosis not present

## 2017-01-24 ENCOUNTER — Ambulatory Visit: Payer: Medicare Other | Admitting: Cardiovascular Disease

## 2017-01-24 NOTE — Progress Notes (Deleted)
Cardiology Office Note   Date:  01/24/2017   ID:  Dana Horton, DOB 12/04/1945, MRN [id number]  PCP:  Dana Roger, MD  Cardiologist:   Dana Latch, MD   No chief complaint on file.   History of Present Illness: Dana Horton is a 71 y.o. female with paroxysmal atrial fibrillation, hypertension, COPD and OSA who presents for an evaluation of chest pain.  Dana Horton saw Dana Horton on 6/21 and reported chest pain.  At that appointment she was noted to be in atrial fibrillation with a ventricular rate of 140 bpm.  She was referred to [facility] where she reportedly converted back to sinus rhythm when her IV was inserted.  She reportedly had an echocardiogram but is unsure of the results. She was started on metoprolol and they recommended that she take a full strength aspirin. She did this but developed hematochezia. She is always have hematochezia when she tries to take a full strength aspirin or NSAIDS.  She has never had a colonoscopy.  She reports that she has hemorrhoids but is unsure of whether this is because of her weaning.  Since leaving the hospital she has not had any recurrent palpitations area she does report fatigue and often has to take a nap in the afternoon. She denies lower extremity edema, orthopnea, or PND. She has not been exercising regularly but does like to walk for exercise sometimes. She has no exertional symptoms  Dana Horton was admitted to [facility] 09/2015 for chest pain and found to be in atrial fibrillation.  She had a left heart catheterization at that time that reportedly showed normal coronary arteries.  He continues to smoke half pack of cigarettes daily. She has been smoking since age 57. She was able to quit when she was pregnant. She has tried Chantix, Wellbutrin, patches, and gum without success.   Past Medical History:  Diagnosis Date  . Allergy   . 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 Atrial fibrillation ([place])   . Cancer ([place])    bladder  . COPD (chronic obstructive pulmonary disease) ([place])   . Essential hypertension   . Heart murmur   . Lung nodule   . Myocardial infarction [facility])    ? small with atrial fib   . Osteoporosis   . Pneumonia    hx of   . Seasonal allergies   . Sleep apnea    cpap  setting at 2.5 per patient   . Tobacco abuse     Past Surgical History:  Procedure Laterality Date  . ABDOMINAL HYSTERECTOMY    . BLADDER SURGERY    . COLONOSCOPY N/A 12/28/2016   Procedure: COLONOSCOPY;  Surgeon: Dana Stabler, MD;  Location: [facility];  Service: Gastroenterology;  Laterality: N/A;  . MOUTH SURGERY    . POLYPECTOMY N/A 12/28/2016   Procedure: POLYPECTOMY with injection of [place];  Surgeon: Dana Stabler, MD;  Location: [facility];  Service: Gastroenterology;  Laterality: N/A;     Current Outpatient Prescriptions  Medication Sig Dispense Refill  . albuterol (PROVENTIL) (2.5 MG/3ML) 0.083% nebulizer solution Take 2.5 mg by nebulization 2 (two) times daily as needed for wheezing or shortness of breath.     Marland Kitchen aspirin 81 MG tablet Take 81 mg by mouth daily.    . Calcium Carb-Cholecalciferol (CALCIUM 600/VITAMIN D3 PO) Take 1 tablet by mouth 2 (two) times daily.    . cetirizine (ZYRTEC) 10 MG tablet  Take 10 mg by mouth daily.    Marland Kitchen dronedarone (MULTAQ) 400 MG tablet Take 400 mg by mouth 2 (two) times daily with a meal.    . fluticasone (FLONASE) 50 MCG/ACT nasal spray Place 2 sprays into both nostrils daily.    . hydrochlorothiazide (HYDRODIURIL) 25 MG tablet Take 25 mg by mouth daily.    . metoprolol succinate (TOPROL-XL) 50 MG 24 hr tablet Take 1 tablet (50 mg total) by mouth at bedtime. Take with or immediately following a meal. 90 tablet 3  . Na Sulfate-K Sulfate-Mg Sulf 17.5-3.13-1.6 GM/180ML SOLN Take 1 kit by mouth as directed. 354 mL 0   No current facility-administered medications for this visit.      Allergies:   Lisinopril    Social History:  The patient  reports that she has been smoking.  She has a 26.00 pack-year smoking history. She has never used smokeless tobacco. She reports that she does not drink alcohol or use drugs.   Family History:  The patient's family history includes Arrhythmia in her father; Bladder Cancer in her brother; CAD in her father; Cancer in her father; Heart disease in her father; Hypertension in her mother; Lung cancer in her brother; Stroke in her mother.    ROS:  Please see the history of present illness.   Otherwise, review of systems are positive for none.   All other systems are reviewed and negative.    PHYSICAL EXAM: VS:  LMP  (LMP Unknown)  , BMI There is no height or weight on file to calculate BMI. GENERAL:  Well appearing.  No acute distress. HEENT:  Pupils equal round and reactive, fundi not visualized, oral mucosa unremarkable NECK:  No jugular venous distention, waveform within normal limits, carotid upstroke brisk and symmetric, no bruits, no thyromegaly LUNGS:  Clear to auscultation bilaterally.  No crackles, wheezes, or rhonchi HEART:  RRR.  PMI not displaced or sustained,S1 and S2 within normal limits, no S3, no S4, no clicks, no rubs, no murmurs ABD:  Flat, positive bowel sounds normal in frequency in pitch, no bruits, no rebound, no guarding, no midline pulsatile mass, no hepatomegaly, no splenomegaly EXT:  2 plus pulses throughout, no edema, no cyanosis no clubbing SKIN:  No rashes no nodules NEURO:  Cranial nerves II through XII grossly intact, motor grossly intact throughout PSYCH:  Cognitively intact, oriented to person place and time  EKG:  EKG is ordered today. The ekg ordered 10/24/16 demonstrates sinus bradycardia. Rate 53 bpm.   Recent Labs: No results found for requested labs within last 8760 hours.   03/14/16: WBC 8.1, hemoglobin 13.2, hematocrit 38.9, platelets 195 Sodium 143, potassium 3.6, BUN 18, creatinine  0.88 AST 14, ALT 8 Chol 178, triglycerides 199, HDL 33, LDL 102 TSH 0.83   Lipid Panel No results found for: CHOL, TRIG, HDL, CHOLHDL, VLDL, LDLCALC, LDLDIRECT    Wt Readings from Last 3 Encounters:  12/28/16 72.6 kg (160 lb)  11/12/16 73 kg (161 lb)  11/02/16 73 kg (161 lb)      ASSESSMENT AND PLAN:  # Paroxysmal atrial fibrillation:  Ms. Falkenstein has had 2 documented episodes of atrial fibrillation. We'll get the records from [facility] She reportedly has had an echocardiogram and a cardiac catheterization.  If she has not artery had thyroid testing will check this as well.  Her CHA2DS2-VASc Score is at least 3.  She should be on therapeutic anticoagulation but we are unable 2/2 hematochezia.  We will refer her to  GI and consider starting anticoagulation if a source if found and treated.  Continue aspirin for now.  We will switch metoprolol to succinate '50mg'$  qhs to see if this helps with her fatigue.  If not and assuming her echo doesn't show heart failure, we could switch to a CCB.  Continue dronedarone.  This patients CHA2DS2-VASc Score and unadjusted Ischemic Stroke Rate (% per year) is equal to 3.2 % stroke rate/year from a score of 3  Above score calculated as 1 point each if present [CHF, HTN, DM, Vascular=MI/PAD/Aortic Plaque, Age if 65-74, or Female] Above score calculated as 2 points each if present [Age > 75, or Stroke/TIA/TE]  # hypertension: Continue metoprolol.  Current medicines are reviewed at length with the patient today.  The patient does not have concerns regarding medicines.  The following changes have been made:  Switch to metoprolol succinate  Labs/ tests ordered today include:  No orders of the defined types were placed in this encounter.    Disposition:   FU with Seylah Wernert C. Oval Linsey, MD, [facility] in 3 months    This note was written with the assistance of speech recognition software.  Please excuse any transcriptional  errors.  Signed, Julianna Vanwagner C. Oval Linsey, MD, [facility]  01/24/2017 1:43 PM    [facility]

## 2017-01-29 ENCOUNTER — Encounter: Payer: Self-pay | Admitting: Cardiovascular Disease

## 2017-01-29 ENCOUNTER — Ambulatory Visit (INDEPENDENT_AMBULATORY_CARE_PROVIDER_SITE_OTHER): Payer: Medicare Other | Admitting: Cardiovascular Disease

## 2017-01-29 VITALS — BP 124/66 | HR 55 | Ht 63.0 in | Wt 163.8 lb

## 2017-01-29 DIAGNOSIS — I480 Paroxysmal atrial fibrillation: Secondary | ICD-10-CM | POA: Diagnosis not present

## 2017-01-29 DIAGNOSIS — I10 Essential (primary) hypertension: Secondary | ICD-10-CM | POA: Diagnosis not present

## 2017-01-29 MED ORDER — APIXABAN 5 MG PO TABS: 5.0000 mg | ORAL_TABLET | Freq: Two times a day (BID) | ORAL | 5 refills | Status: DC

## 2017-01-29 MED ORDER — FLECAINIDE ACETATE 50 MG PO TABS: 50.0000 mg | ORAL_TABLET | Freq: Two times a day (BID) | ORAL | 5 refills | Status: DC

## 2017-01-29 NOTE — Progress Notes (Signed)
Cardiology Office Note   Date:  01/29/2017   ID:  Dana Horton, DOB [birth year], MRN [id number]  PCP:  Dana Roger, MD  Cardiologist:   Dana Latch, MD   Chief Complaint  Patient presents with  . Follow-up    3 months;    History of Present Illness: Dana Horton is a 71 y.o. female with paroxysmal atrial fibrillation, hypertension, COPD and OSA who presents for follow-up.  She was initially seen 10/2016 for an evaluation of chest pain.  Dana Horton saw Dana Horton on 6/21 and reported chest pain.  At that appointment she was noted to be in atrial fibrillation with a ventricular rate of 140 bpm.  She was referred to [facility] where she reportedly converted back to sinus rhythm when her IV was inserted.  Echo that admission revealed LVEF 60-65% with mild mitral and tricuspid regurgitation.  She was started on metoprolol and they recommended that she take a full strength aspirin. She did this but developed hematochezia. She is always have hematochezia when she tries to take a full strength aspirin or NSAIDS.  At her last appointment she reported fatigue and metoprolol was switched to nightly.  She was also referred to GI. She saw Dana Horton and had an upper endoscopy that was normal.  She also had a colonoscopy that showed 1-2 mm polyp that was removed and another 12 mm polyp that could not be resected as well as a 10 mm polyp in the sigmoid colon for which resection was not attempted.  Her bleeding was thought to be due to to intermittent benign anal bleeding.  She returned for colonoscopy 12/2016 with removal of more polyps.  She was also found to have diverticuli throughout the entire colon.  It was also noted that she had internal hemorrhoids.  It was felt to be safe for her to start anticoagulation 2 weeks following her colonoscopy.  Since her last appointment Dana Horton has been doing well.  She has not noted any recurrent  palpitations.  She denies chest pain, shortness of breath, lightheadedness, or dizziness.  She also denies lower extremity edema, orthopnea, or PND.  She has been struggling with a pinched nerve in her neck.  She took some ibuprofen and did not have any rectal bleeding.  This typically was a trigger for her.  She has also tolerated aspirin.  For the last few days she has been struggling with a cold but is otherwise well.   Past Medical History:  Diagnosis Date  . Allergy   . Arthritis, rheumatoid ([place])   . Atrial fibrillation ([place])   . Cancer ([place])    bladder  . COPD (chronic obstructive pulmonary disease) ([place])   . Essential hypertension   . Heart murmur   . Lung nodule   . Myocardial infarction [facility])    ? small with atrial fib   . Osteoporosis   . Pneumonia    hx of   . Seasonal allergies   . Sleep apnea    cpap  setting at 2.5 per patient   . Tobacco abuse     Past Surgical History:  Procedure Laterality Date  . ABDOMINAL HYSTERECTOMY    . BLADDER SURGERY    . MOUTH SURGERY        Current Outpatient Medications  Medication Sig Dispense Refill  . albuterol (PROVENTIL) (2.5 MG/3ML) 0.083% nebulizer solution Take 2.5 mg by nebulization 2 (two) times daily as needed for wheezing or shortness  of breath.     . Calcium Carb-Cholecalciferol (CALCIUM 600/VITAMIN D3 PO) Take 1 tablet by mouth 2 (two) times daily.    . cetirizine (ZYRTEC) 10 MG tablet Take 10 mg by mouth daily.    Marland Kitchen dronedarone (MULTAQ) 400 MG tablet Take 400 mg by mouth 2 (two) times daily with a meal.    . fluticasone (FLONASE) 50 MCG/ACT nasal spray Place 2 sprays into both nostrils daily.    . hydrochlorothiazide (HYDRODIURIL) 25 MG tablet Take 25 mg by mouth daily.    . metoprolol succinate (TOPROL-XL) 50 MG 24 hr tablet Take 1 tablet (50 mg total) by mouth at bedtime. Take with or immediately following a meal. 90 tablet 3   No current facility-administered medications for this visit.     Allergies:    Lisinopril    Social History:  The patient  reports that she has been smoking.  She has a 26.00 pack-year smoking history. she has never used smokeless tobacco. She reports that she does not drink alcohol or use drugs.   Family History:  The patient's family history includes Arrhythmia in her father; Bladder Cancer in her brother; CAD in her father; Cancer in her father; Heart disease in her father; Hypertension in her mother; Lung cancer in her brother; Stroke in her mother.    ROS:  Please see the history of present illness.   Otherwise, review of systems are positive for none.   All other systems are reviewed and negative.    PHYSICAL EXAM: VS:  BP 124/66   Pulse (!) 55   Ht 5\' 3"  (1.6 m)   Wt 74.3 kg (163 lb 12.8 oz)   LMP  (LMP Unknown)   BMI 29.02 kg/m  , BMI Body mass index is 29.02 kg/m. GENERAL:  Well appearing.  No acute distress HEENT: Pupils equal round and reactive, fundi not visualized, oral mucosa unremarkable NECK:  No jugular venous distention, waveform within normal limits, carotid upstroke brisk and symmetric, no bruits, no thyromegaly LUNGS:  Clear to auscultation bilaterally HEART:  RRR.  PMI not displaced or sustained,S1 and S2 within normal limits, no S3, no S4, no clicks, no rubs, II/VI systolic murmur at the LUSB.  ABD:  Flat, positive bowel sounds normal in frequency in pitch, no bruits, no rebound, no guarding, no midline pulsatile mass, no hepatomegaly, no splenomegaly EXT:  2 plus pulses throughout, no edema, no cyanosis no clubbing SKIN:  No rashes no nodules NEURO:  Cranial nerves II through XII grossly intact, motor grossly intact throughout PSYCH:  Cognitively intact, oriented to person place and time   EKG:  EKG is not ordered today. The ekg ordered 10/24/16 demonstrates sinus bradycardia. Rate 53 bpm.  Echo 09/14/16: LVEF 60-65%.  Abnormal diastolic function.  Mild mitral regurgitation and tricuspid regurgitation.  PASP 31 mmHg.  Recent Labs: No  results found for requested labs within last 8760 hours.   03/14/16: WBC 8.1, hemoglobin 13.2, hematocrit 38.9, platelets 195 Sodium 143, potassium 3.6, BUN 18, creatinine 0.88 AST 14, ALT 8 Chol 178, triglycerides 199, HDL 33, LDL 102 TSH 0.83   Lipid Panel No results found for: CHOL, TRIG, HDL, CHOLHDL, VLDL, LDLCALC, LDLDIRECT    Wt Readings from Last 3 Encounters:  01/29/17 74.3 kg (163 lb 12.8 oz)  12/28/16 72.6 kg (160 lb)  11/12/16 73 kg (161 lb)      ASSESSMENT AND PLAN:  # Paroxysmal atrial fibrillation:  Ms. Feria has had 2 documented episodes of atrial fibrillation.  She had a normal echo and LHC with evidence of CAD.  She had colon polyps remov safe to start anticoagulation.  We will start Eliquis 5 mg twice daily.  ed and is thought to be she is nearing the donut hole in her insurance and is concerned about the price of dronedarone.  We will stop this medication for 1 week.  She will then start flecainide 50 mg twice daily.  Continue metoprolol.  She is due to have lab work with her primary care doctor in 2 days.  She will get a CBC, BMP, and magnesium at that time and have these results sent to [place].   This patients CHA2DS2-VASc Score and unadjusted Ischemic Stroke Rate (% per year) is equal to 3.2 % stroke rate/year from a score of 3  Above score calculated as 1 point each if present [CHF, HTN, DM, Vascular=MI/PAD/Aortic Plaque, Age if 65-74, or Female] Above score calculated as 2 points each if present [Age > 75, or Stroke/TIA/TE]  # Hypertension: Blood pressure is well-controlled.  Continue metoprolol and HCTZ.  Current medicines are reviewed at length with the patient today.  The patient does not have concerns regarding medicines.  The following changes have been made: stop dronedarone.  Start flecainide and Eliquis.   Labs/ tests ordered today include:  No orders of the defined types were placed in this encounter.    Disposition:   FU with Tremel Setters C.  Oval Linsey, MD, [facility] in 3 months    This note was written with the assistance of speech recognition software.  Please excuse any transcriptional errors.  Signed, Hollan Philipp C. Oval Linsey, MD, [facility]  01/29/2017 1:32 PM    [facility]

## 2017-01-29 NOTE — Patient Instructions (Signed)
Medication Instructions:  STOP MULTAQ   START FLECAINIDE 50 MG TWICE A DAY STARTING Tuesday 02/05/17  START ELIQUIS 5 MG TWICE A DAY   Labwork: BMET/CBC/MAGNESIUM AT YOUR PRIMARY CARE LATER THIS WEEK  Testing/Procedures: NONE  Follow-Up: Your physician recommends that you schedule a follow-up appointment in: [place]   If you need a refill on your cardiac medications before your next appointment, please call your pharmacy.

## 2017-01-31 DIAGNOSIS — M8580 Other specified disorders of bone density and structure, unspecified site: Secondary | ICD-10-CM | POA: Diagnosis not present

## 2017-01-31 DIAGNOSIS — Z87891 Personal history of nicotine dependence: Secondary | ICD-10-CM | POA: Diagnosis not present

## 2017-01-31 DIAGNOSIS — Z720 Tobacco use: Secondary | ICD-10-CM | POA: Diagnosis not present

## 2017-02-15 DIAGNOSIS — J189 Pneumonia, unspecified organism: Secondary | ICD-10-CM | POA: Diagnosis not present

## 2017-03-20 DIAGNOSIS — J449 Chronic obstructive pulmonary disease, unspecified: Secondary | ICD-10-CM | POA: Diagnosis not present

## 2017-03-20 DIAGNOSIS — J189 Pneumonia, unspecified organism: Secondary | ICD-10-CM | POA: Diagnosis not present

## 2017-03-20 DIAGNOSIS — R0602 Shortness of breath: Secondary | ICD-10-CM | POA: Diagnosis not present

## 2017-03-27 DIAGNOSIS — J189 Pneumonia, unspecified organism: Secondary | ICD-10-CM | POA: Diagnosis not present

## 2017-03-27 DIAGNOSIS — J9691 Respiratory failure, unspecified with hypoxia: Secondary | ICD-10-CM | POA: Diagnosis not present

## 2017-03-27 DIAGNOSIS — I10 Essential (primary) hypertension: Secondary | ICD-10-CM | POA: Diagnosis not present

## 2017-03-27 DIAGNOSIS — E876 Hypokalemia: Secondary | ICD-10-CM

## 2017-03-27 DIAGNOSIS — R05 Cough: Secondary | ICD-10-CM | POA: Diagnosis not present

## 2017-03-27 DIAGNOSIS — J439 Emphysema, unspecified: Secondary | ICD-10-CM | POA: Diagnosis not present

## 2017-03-27 DIAGNOSIS — I482 Chronic atrial fibrillation: Secondary | ICD-10-CM

## 2017-03-27 DIAGNOSIS — E872 Acidosis: Secondary | ICD-10-CM | POA: Diagnosis not present

## 2017-03-27 DIAGNOSIS — J441 Chronic obstructive pulmonary disease with (acute) exacerbation: Secondary | ICD-10-CM | POA: Diagnosis not present

## 2017-03-27 DIAGNOSIS — R7989 Other specified abnormal findings of blood chemistry: Secondary | ICD-10-CM | POA: Diagnosis not present

## 2017-03-27 DIAGNOSIS — R0902 Hypoxemia: Secondary | ICD-10-CM | POA: Diagnosis not present

## 2017-03-27 DIAGNOSIS — Z720 Tobacco use: Secondary | ICD-10-CM | POA: Diagnosis not present

## 2017-03-27 DIAGNOSIS — J9601 Acute respiratory failure with hypoxia: Secondary | ICD-10-CM | POA: Diagnosis not present

## 2017-03-27 DIAGNOSIS — I480 Paroxysmal atrial fibrillation: Secondary | ICD-10-CM | POA: Diagnosis not present

## 2017-03-28 DIAGNOSIS — Z9981 Dependence on supplemental oxygen: Secondary | ICD-10-CM | POA: Diagnosis not present

## 2017-03-28 DIAGNOSIS — J189 Pneumonia, unspecified organism: Secondary | ICD-10-CM | POA: Diagnosis present

## 2017-03-28 DIAGNOSIS — Z79899 Other long term (current) drug therapy: Secondary | ICD-10-CM | POA: Diagnosis not present

## 2017-03-28 DIAGNOSIS — Z716 Tobacco abuse counseling: Secondary | ICD-10-CM | POA: Diagnosis not present

## 2017-03-28 DIAGNOSIS — J9601 Acute respiratory failure with hypoxia: Secondary | ICD-10-CM | POA: Diagnosis not present

## 2017-03-28 DIAGNOSIS — E876 Hypokalemia: Secondary | ICD-10-CM | POA: Diagnosis present

## 2017-03-28 DIAGNOSIS — I252 Old myocardial infarction: Secondary | ICD-10-CM | POA: Diagnosis not present

## 2017-03-28 DIAGNOSIS — J441 Chronic obstructive pulmonary disease with (acute) exacerbation: Secondary | ICD-10-CM | POA: Diagnosis not present

## 2017-03-28 DIAGNOSIS — E872 Acidosis: Secondary | ICD-10-CM | POA: Diagnosis present

## 2017-03-28 DIAGNOSIS — F419 Anxiety disorder, unspecified: Secondary | ICD-10-CM | POA: Diagnosis present

## 2017-03-28 DIAGNOSIS — R7989 Other specified abnormal findings of blood chemistry: Secondary | ICD-10-CM | POA: Diagnosis present

## 2017-03-28 DIAGNOSIS — J440 Chronic obstructive pulmonary disease with acute lower respiratory infection: Secondary | ICD-10-CM | POA: Diagnosis not present

## 2017-03-28 DIAGNOSIS — Z7982 Long term (current) use of aspirin: Secondary | ICD-10-CM | POA: Diagnosis not present

## 2017-03-28 DIAGNOSIS — R739 Hyperglycemia, unspecified: Secondary | ICD-10-CM | POA: Diagnosis present

## 2017-03-28 DIAGNOSIS — Z701 Counseling related to patient's sexual behavior and orientation: Secondary | ICD-10-CM | POA: Diagnosis not present

## 2017-03-28 DIAGNOSIS — I10 Essential (primary) hypertension: Secondary | ICD-10-CM | POA: Diagnosis present

## 2017-03-28 DIAGNOSIS — I4892 Unspecified atrial flutter: Secondary | ICD-10-CM | POA: Diagnosis present

## 2017-03-28 DIAGNOSIS — R05 Cough: Secondary | ICD-10-CM | POA: Diagnosis not present

## 2017-03-28 DIAGNOSIS — I480 Paroxysmal atrial fibrillation: Secondary | ICD-10-CM | POA: Diagnosis not present

## 2017-03-28 DIAGNOSIS — F17210 Nicotine dependence, cigarettes, uncomplicated: Secondary | ICD-10-CM | POA: Diagnosis present

## 2017-03-28 DIAGNOSIS — R0602 Shortness of breath: Secondary | ICD-10-CM | POA: Diagnosis not present

## 2017-03-28 DIAGNOSIS — J9691 Respiratory failure, unspecified with hypoxia: Secondary | ICD-10-CM | POA: Diagnosis not present

## 2017-04-05 DIAGNOSIS — G4733 Obstructive sleep apnea (adult) (pediatric): Secondary | ICD-10-CM | POA: Diagnosis not present

## 2017-04-05 DIAGNOSIS — J302 Other seasonal allergic rhinitis: Secondary | ICD-10-CM | POA: Diagnosis not present

## 2017-04-05 DIAGNOSIS — I4891 Unspecified atrial fibrillation: Secondary | ICD-10-CM | POA: Diagnosis not present

## 2017-04-05 DIAGNOSIS — M8580 Other specified disorders of bone density and structure, unspecified site: Secondary | ICD-10-CM | POA: Diagnosis not present

## 2017-04-05 DIAGNOSIS — Z1389 Encounter for screening for other disorder: Secondary | ICD-10-CM | POA: Diagnosis not present

## 2017-04-05 DIAGNOSIS — M150 Primary generalized (osteo)arthritis: Secondary | ICD-10-CM | POA: Diagnosis not present

## 2017-04-05 DIAGNOSIS — Z6830 Body mass index (BMI) 30.0-30.9, adult: Secondary | ICD-10-CM | POA: Diagnosis not present

## 2017-04-05 DIAGNOSIS — Z87891 Personal history of nicotine dependence: Secondary | ICD-10-CM | POA: Diagnosis not present

## 2017-04-05 DIAGNOSIS — I10 Essential (primary) hypertension: Secondary | ICD-10-CM | POA: Diagnosis not present

## 2017-04-05 DIAGNOSIS — E7800 Pure hypercholesterolemia, unspecified: Secondary | ICD-10-CM | POA: Diagnosis not present

## 2017-04-05 DIAGNOSIS — J449 Chronic obstructive pulmonary disease, unspecified: Secondary | ICD-10-CM | POA: Diagnosis not present

## 2017-04-06 DIAGNOSIS — R072 Precordial pain: Secondary | ICD-10-CM | POA: Diagnosis not present

## 2017-04-06 DIAGNOSIS — R778 Other specified abnormalities of plasma proteins: Secondary | ICD-10-CM | POA: Diagnosis not present

## 2017-04-06 DIAGNOSIS — I4891 Unspecified atrial fibrillation: Secondary | ICD-10-CM | POA: Diagnosis not present

## 2017-04-06 DIAGNOSIS — R079 Chest pain, unspecified: Secondary | ICD-10-CM | POA: Diagnosis not present

## 2017-04-07 ENCOUNTER — Other Ambulatory Visit: Payer: Self-pay

## 2017-04-07 ENCOUNTER — Encounter (HOSPITAL_COMMUNITY): Payer: Self-pay | Admitting: Cardiology

## 2017-04-07 ENCOUNTER — Inpatient Hospital Stay (HOSPITAL_COMMUNITY)
Admission: AD | Admit: 2017-04-07 | Discharge: 2017-04-13 | DRG: 280 | Disposition: A | Discharge status: Home / Self Care | Payer: Medicare Other | Source: Other Acute Inpatient Hospital | Attending: Internal Medicine | Admitting: Internal Medicine

## 2017-04-07 DIAGNOSIS — R072 Precordial pain: Secondary | ICD-10-CM | POA: Diagnosis not present

## 2017-04-07 DIAGNOSIS — I214 Non-ST elevation (NSTEMI) myocardial infarction: Secondary | ICD-10-CM | POA: Diagnosis present

## 2017-04-07 DIAGNOSIS — R002 Palpitations: Secondary | ICD-10-CM | POA: Diagnosis not present

## 2017-04-07 DIAGNOSIS — Z79899 Other long term (current) drug therapy: Secondary | ICD-10-CM

## 2017-04-07 DIAGNOSIS — I252 Old myocardial infarction: Secondary | ICD-10-CM | POA: Diagnosis not present

## 2017-04-07 DIAGNOSIS — M810 Age-related osteoporosis without current pathological fracture: Secondary | ICD-10-CM | POA: Diagnosis present

## 2017-04-07 DIAGNOSIS — I21A1 Myocardial infarction type 2: Principal | ICD-10-CM | POA: Diagnosis present

## 2017-04-07 DIAGNOSIS — Z8249 Family history of ischemic heart disease and other diseases of the circulatory system: Secondary | ICD-10-CM

## 2017-04-07 DIAGNOSIS — R079 Chest pain, unspecified: Secondary | ICD-10-CM | POA: Diagnosis not present

## 2017-04-07 DIAGNOSIS — Z9989 Dependence on other enabling machines and devices: Secondary | ICD-10-CM

## 2017-04-07 DIAGNOSIS — M069 Rheumatoid arthritis, unspecified: Secondary | ICD-10-CM | POA: Diagnosis present

## 2017-04-07 DIAGNOSIS — Z801 Family history of malignant neoplasm of trachea, bronchus and lung: Secondary | ICD-10-CM

## 2017-04-07 DIAGNOSIS — J439 Emphysema, unspecified: Secondary | ICD-10-CM | POA: Diagnosis present

## 2017-04-07 DIAGNOSIS — R062 Wheezing: Secondary | ICD-10-CM

## 2017-04-07 DIAGNOSIS — Z888 Allergy status to other drugs, medicaments and biological substances status: Secondary | ICD-10-CM

## 2017-04-07 DIAGNOSIS — J811 Chronic pulmonary edema: Secondary | ICD-10-CM | POA: Diagnosis not present

## 2017-04-07 DIAGNOSIS — J449 Chronic obstructive pulmonary disease, unspecified: Secondary | ICD-10-CM | POA: Diagnosis not present

## 2017-04-07 DIAGNOSIS — I5032 Chronic diastolic (congestive) heart failure: Secondary | ICD-10-CM | POA: Diagnosis present

## 2017-04-07 DIAGNOSIS — I480 Paroxysmal atrial fibrillation: Secondary | ICD-10-CM | POA: Diagnosis not present

## 2017-04-07 DIAGNOSIS — J9601 Acute respiratory failure with hypoxia: Secondary | ICD-10-CM | POA: Diagnosis not present

## 2017-04-07 DIAGNOSIS — Z90710 Acquired absence of both cervix and uterus: Secondary | ICD-10-CM

## 2017-04-07 DIAGNOSIS — I509 Heart failure, unspecified: Secondary | ICD-10-CM | POA: Diagnosis not present

## 2017-04-07 DIAGNOSIS — J181 Lobar pneumonia, unspecified organism: Secondary | ICD-10-CM | POA: Diagnosis not present

## 2017-04-07 DIAGNOSIS — I2489 Other forms of acute ischemic heart disease: Secondary | ICD-10-CM | POA: Diagnosis present

## 2017-04-07 DIAGNOSIS — I5033 Acute on chronic diastolic (congestive) heart failure: Secondary | ICD-10-CM | POA: Diagnosis not present

## 2017-04-07 DIAGNOSIS — Z8551 Personal history of malignant neoplasm of bladder: Secondary | ICD-10-CM | POA: Diagnosis not present

## 2017-04-07 DIAGNOSIS — E876 Hypokalemia: Secondary | ICD-10-CM | POA: Diagnosis present

## 2017-04-07 DIAGNOSIS — R0602 Shortness of breath: Secondary | ICD-10-CM

## 2017-04-07 DIAGNOSIS — Y95 Nosocomial condition: Secondary | ICD-10-CM | POA: Diagnosis present

## 2017-04-07 DIAGNOSIS — Z823 Family history of stroke: Secondary | ICD-10-CM | POA: Diagnosis not present

## 2017-04-07 DIAGNOSIS — I10 Essential (primary) hypertension: Secondary | ICD-10-CM | POA: Diagnosis not present

## 2017-04-07 DIAGNOSIS — M1990 Unspecified osteoarthritis, unspecified site: Secondary | ICD-10-CM | POA: Diagnosis present

## 2017-04-07 DIAGNOSIS — Z8052 Family history of malignant neoplasm of bladder: Secondary | ICD-10-CM | POA: Diagnosis not present

## 2017-04-07 DIAGNOSIS — I959 Hypotension, unspecified: Secondary | ICD-10-CM | POA: Diagnosis present

## 2017-04-07 DIAGNOSIS — J302 Other seasonal allergic rhinitis: Secondary | ICD-10-CM | POA: Diagnosis present

## 2017-04-07 DIAGNOSIS — M179 Osteoarthritis of knee, unspecified: Secondary | ICD-10-CM | POA: Diagnosis present

## 2017-04-07 DIAGNOSIS — G4733 Obstructive sleep apnea (adult) (pediatric): Secondary | ICD-10-CM

## 2017-04-07 DIAGNOSIS — Z6829 Body mass index (BMI) 29.0-29.9, adult: Secondary | ICD-10-CM

## 2017-04-07 DIAGNOSIS — R778 Other specified abnormalities of plasma proteins: Secondary | ICD-10-CM | POA: Diagnosis present

## 2017-04-07 DIAGNOSIS — J441 Chronic obstructive pulmonary disease with (acute) exacerbation: Secondary | ICD-10-CM | POA: Diagnosis present

## 2017-04-07 DIAGNOSIS — R570 Cardiogenic shock: Secondary | ICD-10-CM | POA: Diagnosis present

## 2017-04-07 DIAGNOSIS — I110 Hypertensive heart disease with heart failure: Secondary | ICD-10-CM | POA: Diagnosis present

## 2017-04-07 DIAGNOSIS — J90 Pleural effusion, not elsewhere classified: Secondary | ICD-10-CM | POA: Diagnosis not present

## 2017-04-07 DIAGNOSIS — J189 Pneumonia, unspecified organism: Secondary | ICD-10-CM | POA: Clinically undetermined

## 2017-04-07 DIAGNOSIS — F17210 Nicotine dependence, cigarettes, uncomplicated: Secondary | ICD-10-CM | POA: Diagnosis present

## 2017-04-07 DIAGNOSIS — I248 Other forms of acute ischemic heart disease: Secondary | ICD-10-CM | POA: Diagnosis not present

## 2017-04-07 DIAGNOSIS — E669 Obesity, unspecified: Secondary | ICD-10-CM | POA: Diagnosis present

## 2017-04-07 DIAGNOSIS — Z8701 Personal history of pneumonia (recurrent): Secondary | ICD-10-CM | POA: Diagnosis not present

## 2017-04-07 DIAGNOSIS — R748 Abnormal levels of other serum enzymes: Secondary | ICD-10-CM | POA: Diagnosis not present

## 2017-04-07 DIAGNOSIS — I4891 Unspecified atrial fibrillation: Secondary | ICD-10-CM | POA: Diagnosis present

## 2017-04-07 DIAGNOSIS — E785 Hyperlipidemia, unspecified: Secondary | ICD-10-CM | POA: Diagnosis present

## 2017-04-07 DIAGNOSIS — I482 Chronic atrial fibrillation: Secondary | ICD-10-CM | POA: Diagnosis present

## 2017-04-07 DIAGNOSIS — Z7902 Long term (current) use of antithrombotics/antiplatelets: Secondary | ICD-10-CM | POA: Diagnosis not present

## 2017-04-07 DIAGNOSIS — Z7901 Long term (current) use of anticoagulants: Secondary | ICD-10-CM

## 2017-04-07 DIAGNOSIS — I481 Persistent atrial fibrillation: Secondary | ICD-10-CM | POA: Diagnosis present

## 2017-04-07 DIAGNOSIS — K648 Other hemorrhoids: Secondary | ICD-10-CM | POA: Diagnosis present

## 2017-04-07 DIAGNOSIS — D72829 Elevated white blood cell count, unspecified: Secondary | ICD-10-CM | POA: Diagnosis present

## 2017-04-07 DIAGNOSIS — R7989 Other specified abnormal findings of blood chemistry: Secondary | ICD-10-CM | POA: Diagnosis present

## 2017-04-07 DIAGNOSIS — I219 Acute myocardial infarction, unspecified: Secondary | ICD-10-CM

## 2017-04-07 DIAGNOSIS — R011 Cardiac murmur, unspecified: Secondary | ICD-10-CM | POA: Diagnosis present

## 2017-04-07 DIAGNOSIS — Z87891 Personal history of nicotine dependence: Secondary | ICD-10-CM | POA: Diagnosis not present

## 2017-04-07 HISTORY — DX: Non-ST elevation (NSTEMI) myocardial infarction: I21.4

## 2017-04-07 HISTORY — DX: Acute myocardial infarction, unspecified: I21.9

## 2017-04-07 MED ORDER — ONDANSETRON HCL 4 MG/2ML IJ SOLN: 4.0000 mg | Freq: Four times a day (QID) | INTRAMUSCULAR | Status: DC | PRN

## 2017-04-07 MED ORDER — SODIUM CHLORIDE 0.9% FLUSH: 3.0000 mL | INTRAVENOUS | Status: DC | PRN

## 2017-04-07 MED ORDER — SODIUM CHLORIDE 0.9 % WEIGHT BASED INFUSION
3.0000 mL/kg/h | INTRAVENOUS | Status: DC
  Administered 2017-04-08: 3 mL/kg/h via INTRAVENOUS

## 2017-04-07 MED ORDER — SODIUM CHLORIDE 0.9 % IV SOLN: 250.0000 mL | INTRAVENOUS | Status: DC | PRN

## 2017-04-07 MED ORDER — ASPIRIN 81 MG PO CHEW
81.0000 mg | CHEWABLE_TABLET | ORAL | Status: AC
  Administered 2017-04-08: 81 mg via ORAL
  Filled 2017-04-07: qty 1

## 2017-04-07 MED ORDER — LORATADINE 10 MG PO TABS
10.0000 mg | ORAL_TABLET | Freq: Every day | ORAL | Status: DC
  Administered 2017-04-07 – 2017-04-13 (×7): 10 mg via ORAL
  Filled 2017-04-07 (×7): qty 1

## 2017-04-07 MED ORDER — APIXABAN 5 MG PO TABS: 5.0000 mg | ORAL_TABLET | Freq: Once | ORAL | Status: DC

## 2017-04-07 MED ORDER — ENOXAPARIN SODIUM 80 MG/0.8ML ~~LOC~~ SOLN
1.0000 mg/kg | Freq: Two times a day (BID) | SUBCUTANEOUS | Status: DC
  Administered 2017-04-07 – 2017-04-08 (×2): 75 mg via SUBCUTANEOUS
  Filled 2017-04-07 (×2): qty 0.8

## 2017-04-07 MED ORDER — ACETAMINOPHEN 325 MG PO TABS: 650.0000 mg | ORAL_TABLET | ORAL | Status: DC | PRN

## 2017-04-07 MED ORDER — ATORVASTATIN CALCIUM 80 MG PO TABS
80.0000 mg | ORAL_TABLET | Freq: Every day | ORAL | Status: DC
  Administered 2017-04-07 – 2017-04-12 (×6): 80 mg via ORAL
  Filled 2017-04-07 (×7): qty 1

## 2017-04-07 MED ORDER — SODIUM CHLORIDE 0.9 % WEIGHT BASED INFUSION
1.0000 mL/kg/h | INTRAVENOUS | Status: DC
  Administered 2017-04-08: 1 mL/kg/h via INTRAVENOUS

## 2017-04-07 MED ORDER — ALBUTEROL SULFATE (2.5 MG/3ML) 0.083% IN NEBU
2.5000 mg | INHALATION_SOLUTION | Freq: Two times a day (BID) | RESPIRATORY_TRACT | Status: DC | PRN
  Administered 2017-04-07 – 2017-04-08 (×4): 2.5 mg via RESPIRATORY_TRACT
  Filled 2017-04-07 (×4): qty 3

## 2017-04-07 MED ORDER — METOPROLOL SUCCINATE ER 50 MG PO TB24
50.0000 mg | ORAL_TABLET | Freq: Every day | ORAL | Status: DC
  Administered 2017-04-07 – 2017-04-12 (×6): 50 mg via ORAL
  Filled 2017-04-07 (×6): qty 1

## 2017-04-07 MED ORDER — SODIUM CHLORIDE 0.9% FLUSH
3.0000 mL | Freq: Two times a day (BID) | INTRAVENOUS | Status: DC
  Administered 2017-04-08 – 2017-04-09 (×2): 3 mL via INTRAVENOUS

## 2017-04-07 MED ORDER — FLUTICASONE PROPIONATE 50 MCG/ACT NA SUSP
2.0000 | Freq: Every day | NASAL | Status: DC
  Administered 2017-04-07 – 2017-04-13 (×7): 2 via NASAL
  Filled 2017-04-07 (×2): qty 16

## 2017-04-07 MED ORDER — AMIODARONE HCL IN DEXTROSE 360-4.14 MG/200ML-% IV SOLN: 60.0000 mg/h | INTRAVENOUS | Status: AC

## 2017-04-07 NOTE — Progress Notes (Signed)
Rt at bedside called by nurse to come listen to pt. RT gave PRN albuterol to pt and pt will put on her home cpap machine when done with treatment to help with SOB. Pt is very compliant with machine and is waiting on cardiac cath in the am. Pt has PRN treatments and RT will follow as needed.

## 2017-04-07 NOTE — Progress Notes (Signed)
ANTICOAGULATION CONSULT NOTE - Initial Consult  Pharmacy Consult for enoxaparin Indication: atrial fibrillation  Allergies  Allergen Reactions  . Lisinopril Cough    Patient Measurements: Height: 5\' 3"  (160 cm) Weight: 167 lb 15.9 oz (76.2 kg)(scale A) IBW/kg (Calculated) : 52.4   Vital Signs: Temp: 97.5 F (36.4 C) (01/13 1300) Temp Source: Oral (01/13 1300) BP: 131/56 (01/13 1300) Pulse Rate: 64 (01/13 1300)  Labs: No results for input(s): HGB, HCT, PLT, APTT, LABPROT, INR, HEPARINUNFRC, HEPRLOWMOCWT, CREATININE, CKTOTAL, CKMB, TROPONINI in the last 72 hours.  CrCl cannot be calculated (No order found.).   Medical History: Past Medical History:  Diagnosis Date  . Allergy   . Arthritis, rheumatoid ([place])   . Atrial fibrillation ([place])   . Cancer ([place])    bladder  . COPD (chronic obstructive pulmonary disease) ([place])   . Essential hypertension   . Heart murmur   . Lung nodule   . Myocardial infarction [facility])    ? small with atrial fib   . Osteoporosis   . Pneumonia    hx of   . Seasonal allergies   . Sleep apnea    cpap  setting at 2.5 per patient   . Tobacco abuse     Medications:  Scheduled:  . fluticasone  2 spray Each Nare Daily  . loratadine  10 mg Oral Daily  . metoprolol succinate  50 mg Oral QHS    Assessment: 26 yof with history of atrial fibrillation transferred to [facility] from [facility]. Patient was on apixaban at OSH. Confirmed with paper chart and in-patient pharmacist at OSH that last dose of apixaban 5 mg was on 1/13 at 0300. Renal function from OSH showed Scr of 0.9 (CrCl ~65 mL/min). Plan for possible cardiac cath tomorrow. No signs/symptoms of bleeding.  Goal of Therapy:  Monitor platelets by anticoagulation protocol: Yes   Plan:  Order enoxaparin 1 mg/kg every 12 hours starting tonight (12 hrs from last dose) Will monitor renal function, CBC, and s/sx of bleeding Will follow up after cardiac cath for plan to restart  apixaban  Doylene Canard, PharmD Clinical Pharmacist  Pager: [phone] Clinical Phone for 04/07/2017 until 3:30pm: [phone] If after 3:30pm, please call main pharmacy at [phone] 04/07/2017,4:40 PM

## 2017-04-07 NOTE — Progress Notes (Signed)
Patient has signed consent form for cardiac catheterization.

## 2017-04-07 NOTE — Progress Notes (Signed)
   Pt is planned for cardiac cath later tomorrow. Per Dr. Lovena Le she can have a light breakfast and then NPO except meds with sip.  She has amiodarone currently infusing. She is to complete this bag and then discontinue the amiodarone. Continue beta blocker. Rhythm management will be addressed after cath findings.   Daune Perch, AGNP-C [facility] 04/07/2017  4:00 PM Pager: [phone]

## 2017-04-07 NOTE — H&P (Addendum)
Cardiology Admission History and Physical:   Patient ID: Dana Horton; MRN: [id number]; DOB: [birth year]   Admission date: 04/07/2017  Primary Care Provider: Townsend Roger, MD Primary Cardiologist: Skeet Latch, MD   Chief Complaint:  Chest pain and afib with RVR  Patient Profile:   Dana Horton is a 72 y.o. female with a history of PAF, HTN, COPD, bladder cancer and OSA on CPAP  History of Present Illness:   Dana Horton was in her usual state of health until yesterday when she developed a racing heart, chest pain, and sob.  She presented to the [facility] emergency room where she was found to be in atrial fibrillation with a rapid ventricular response.  The patient also had substernal chest discomfort associated with neck and jaw discomfort, diaphoresis and shortness of breath.  She ruled in for a non-ST elevation MI.  On amiodarone intravenous, she reverted back to sinus rhythm after attempts to cardiovert her were unsuccessful.  She now feels well but does have some residual dyspnea.  The patient has a long history of tobacco abuse and stop smoking just over a week ago. Dana Horton was last seen in the office by Dr. Oval Linsey on 01/29/2017. It was noted that the patient was seen for chest pain in 08/2016 and found to be afib with RVR. She converted back to sinus rhythm spontaneously. She was noted to have a normal echo. She was started on metoprolol and full strength aspirin. She developed hematochezia which she always has when she tries to take NSAIDs. She was referred to GI and EGD was normal. SHe also had colonoscopy and polyps were removed. Her bleeding was thought to be intermittent benign anal bleeding. She was also found to have diverticuli and internal hemorrhoids. It was felt to be safe to start anticoagulation. She was started on ELiquis 5 mg bid for CHA2DS2/VAS Stroke Risk Score of 3 (HTN, Age, female). At some point she was  started on dronedarone. Due to cost this was stopped and Flecainide was initiated.     Past Medical History:  Diagnosis Date  . Allergy   . Arthritis, rheumatoid ([place])   . Atrial fibrillation ([place])   . Cancer ([place])    bladder  . COPD (chronic obstructive pulmonary disease) ([place])   . Essential hypertension   . Heart murmur   . Lung nodule   . Myocardial infarction [facility])    ? small with atrial fib   . Osteoporosis   . Pneumonia    hx of   . Seasonal allergies   . Sleep apnea    cpap  setting at 2.5 per patient   . Tobacco abuse     Past Surgical History:  Procedure Laterality Date  . ABDOMINAL HYSTERECTOMY    . BLADDER SURGERY    . COLONOSCOPY N/A 12/28/2016   Procedure: COLONOSCOPY;  Surgeon: Doran Stabler, MD;  Location: [facility];  Service: Gastroenterology;  Laterality: N/A;  . MOUTH SURGERY    . POLYPECTOMY N/A 12/28/2016   Procedure: POLYPECTOMY with injection of [place];  Surgeon: Doran Stabler, MD;  Location: [facility];  Service: Gastroenterology;  Laterality: N/A;     Medications Prior to Admission: Prior to Admission medications   Medication Sig Start Date End Date Taking? Authorizing Provider  albuterol (PROVENTIL) (2.5 MG/3ML) 0.083% nebulizer solution Take 2.5 mg by nebulization 2 (two) times daily as needed for wheezing or shortness of breath.     [provider]  apixaban (  ELIQUIS) 5 MG TABS tablet Take 1 tablet (5 mg total) 2 (two) times daily by mouth. 01/29/17   Skeet Latch, MD  Calcium Carb-Cholecalciferol (CALCIUM 600/VITAMIN D3 PO) Take 1 tablet by mouth 2 (two) times daily.    [provider]  cetirizine (ZYRTEC) 10 MG tablet Take 10 mg by mouth daily.    [provider]  flecainide (TAMBOCOR) 50 MG tablet Take 1 tablet (50 mg total) 2 (two) times daily by mouth. START MEDICATION ON 02/05/17 01/29/17   Skeet Latch, MD  fluticasone [facility]) 50 MCG/ACT nasal spray Place 2 sprays into both nostrils daily.     [provider]  hydrochlorothiazide (HYDRODIURIL) 25 MG tablet Take 25 mg by mouth daily.    [provider]  metoprolol succinate (TOPROL-XL) 50 MG 24 hr tablet Take 1 tablet (50 mg total) by mouth at bedtime. Take with or immediately following a meal. 10/24/16   Skeet Latch, MD     Allergies:    Allergies  Allergen Reactions  . Lisinopril Cough    Social History:   Social History   Socioeconomic History  . Marital status: Married    Spouse name: Not on file  . Number of children: Not on file  . Years of education: Not on file  . Highest education level: Not on file  Social Needs  . Financial resource strain: Not on file  . Food insecurity - worry: Not on file  . Food insecurity - inability: Not on file  . Transportation needs - medical: Not on file  . Transportation needs - non-medical: Not on file  Occupational History  . Not on file  Tobacco Use  . Smoking status: Current Every Day Smoker    Packs/day: 0.50    Years: 52.00    Pack years: 26.00  . Smokeless tobacco: Never Used  . Tobacco comment: 1/2 pack a day  Substance and Sexual Activity  . Alcohol use: No  . Drug use: No  . Sexual activity: Not on file  Other Topics Concern  . Not on file  Social History Narrative  . Not on file    Family History:   The patient's family history includes Arrhythmia in her father; Bladder Cancer in her brother; CAD in her father; Cancer in her father; Heart disease in her father; Hypertension in her mother; Lung cancer in her brother; Stroke in her mother.    ROS:  Please see the history of present illness.  All other ROS reviewed and negative.     Physical Exam/Data:   Vitals:   04/07/17 1300  BP: (!) 131/56  Pulse: 64  Resp: 20  Temp: (!) 97.5 F (36.4 C)  TempSrc: Oral  SpO2: 94%  Weight: 167 lb 15.9 oz (76.2 kg)  Height: 5\' 3"  (1.6 m)    Intake/Output Summary (Last 24 hours) at 04/07/2017 1500 Last data filed at 04/07/2017  1324 Gross per 24 hour  Intake -  Output 600 ml  Net -600 ml   Filed Weights   04/07/17 1300  Weight: 167 lb 15.9 oz (76.2 kg)   Body mass index is 29.76 kg/m.  General:  Well nourished, well developed, in no acute distress  HEENT: normal Lymph: no adenopathy Neck: 7 cm JVD Endocrine:  No thryomegaly Vascular: No carotid bruits; FA pulses 2+ bilaterally without bruits  Cardiac:  normal S1, S2; RRR; no murmur  Lungs: Scattered rales and rhonchi bilaterally, with minimal wheezing, but no increased work of breathing.   Abd:  soft, nontender, no hepatomegaly  Ext: no edema Musculoskeletal:  No deformities, BUE and BLE strength normal and equal Skin: warm and dry  Neuro:  CNs 2-12 intact, no focal abnormalities noted Psych:  Normal affect    EKG:  The ECG that was done demonstrates atrial fibrillation with a rapid ventricular response. Relevant CV Studies:  Echo 09/14/2016 Normal LV size, EF 20-25%, diastolic filling pattern indicates impaired relaxation, Left atrium mildly dilated, mild MR  Laboratory Data:  ChemistryNo results for input(s): NA, K, CL, CO2, GLUCOSE, BUN, CREATININE, CALCIUM, GFRNONAA, GFRAA, ANIONGAP in the last 168 hours.  No results for input(s): PROT, ALBUMIN, AST, ALT, ALKPHOS, BILITOT in the last 168 hours. HematologyNo results for input(s): WBC, RBC, HGB, HCT, MCV, MCH, MCHC, RDW, PLT in the last 168 hours. Cardiac EnzymesNo results for input(s): TROPONINI in the last 168 hours. No results for input(s): TROPIPOC in the last 168 hours.  BNPNo results for input(s): BNP, PROBNP in the last 168 hours.  DDimer No results for input(s): DDIMER in the last 168 hours.  Radiology/Studies:  No results found.  Assessment and Plan:   1. Paroxysmal atrial fibrillation -This is her third occurrence. Recently treated with flecainide 50 mg bid and BB-Toprol 50 mg -CHA2DS2/VAS Stroke Risk Score of 3 (HTN, Age, female). On Eliquis 5 mg bid for stroke risk reduction.   -Transferred from [facility]. Amiodarone is infusing.  -Normal LV function and mildly dilated AL by echo in 08/2016.  With regard to antiarrhythmic drug therapy, amiodarone is not likely to be a good choice because of her underlying lung disease and COPD.  Dofetilide is an attractive oxygen because of the lack of side effects.  She has been on low-dose flecainide with not a lot of effectiveness and I suspect she will end up having obstructive coronary disease for which the flecainide would be contraindicated.  We could restart the Multitaq, but her cost is prohibitive.  2. Chest pain -she has ruled in for a non-ST elevation MI.  We discussed the treatment options in detail and I have recommended that she proceed with left heart catheterization which we plan to carry out tomorrow.  3. Hypertension -  -Home meds include Toprol 50 mg and hydrochlorothiazide 25 mg daily.  Her blood pressure is currently well controlled.  4. OSA -continue CPAP, she appears to be compliant.   Severity of Illness: The appropriate patient status for this patient is INPATIENT. Inpatient status is judged to be reasonable and necessary in order to provide the required intensity of service to ensure the patient's safety. The patient's presenting symptoms, physical exam findings, and initial radiographic and laboratory data in the context of their chronic comorbidities is felt to place them at high risk for further clinical deterioration. Furthermore, it is not anticipated that the patient will be medically stable for discharge from the hospital within 2 midnights of admission. The following factors support the patient status of inpatient.   " The patient's presenting symptoms include decompensated heart failure. " The worrisome physical exam findings include atrial fibrillation with a very rapid ventricular response. " The initial radiographic and laboratory data are worrisome because of markedly elevated troponin  consistent with non-ST elevation MI. " The chronic co-morbidities include long-standing tobacco abuse and obesity.   * I certify that at the point of admission it is my clinical judgment that the patient will require inpatient hospital care spanning beyond 2 midnights from the point of admission due to high intensity of service, high  risk for further deterioration and high frequency of surveillance required.*    For questions or updates, please contact [place] Please consult [email or web address] for contact info under Cardiology/STEMI.    Signed,  Cristopher Peru, MD

## 2017-04-07 NOTE — Progress Notes (Addendum)
Patient has arrived from [facility] with amiodarone drip infusing. Patient is stable. Vitals signs and weight obtained. Pt placed on telemetry; NSR. Skin intact; bruising noted to arms. Family at bedside. Safety education given and pt verbalizes understanding.  Cardiology text paged to be made aware of admission. Awaiting orders.

## 2017-04-08 ENCOUNTER — Other Ambulatory Visit: Payer: Self-pay

## 2017-04-08 ENCOUNTER — Other Ambulatory Visit (HOSPITAL_COMMUNITY): Payer: Medicare Other

## 2017-04-08 DIAGNOSIS — I5032 Chronic diastolic (congestive) heart failure: Secondary | ICD-10-CM | POA: Diagnosis present

## 2017-04-08 DIAGNOSIS — G4733 Obstructive sleep apnea (adult) (pediatric): Secondary | ICD-10-CM

## 2017-04-08 DIAGNOSIS — I248 Other forms of acute ischemic heart disease: Secondary | ICD-10-CM

## 2017-04-08 DIAGNOSIS — J441 Chronic obstructive pulmonary disease with (acute) exacerbation: Secondary | ICD-10-CM | POA: Diagnosis present

## 2017-04-08 DIAGNOSIS — I5033 Acute on chronic diastolic (congestive) heart failure: Secondary | ICD-10-CM | POA: Diagnosis present

## 2017-04-08 DIAGNOSIS — I509 Heart failure, unspecified: Secondary | ICD-10-CM

## 2017-04-08 DIAGNOSIS — I10 Essential (primary) hypertension: Secondary | ICD-10-CM | POA: Diagnosis present

## 2017-04-08 DIAGNOSIS — J449 Chronic obstructive pulmonary disease, unspecified: Secondary | ICD-10-CM | POA: Diagnosis present

## 2017-04-08 DIAGNOSIS — Z9989 Dependence on other enabling machines and devices: Secondary | ICD-10-CM

## 2017-04-08 DIAGNOSIS — I480 Paroxysmal atrial fibrillation: Secondary | ICD-10-CM

## 2017-04-08 HISTORY — DX: Obstructive sleep apnea (adult) (pediatric): G47.33

## 2017-04-08 HISTORY — DX: Chronic obstructive pulmonary disease with (acute) exacerbation: J44.1

## 2017-04-08 LAB — BASIC METABOLIC PANEL
Anion gap: 9 (ref 5–15)
BUN: 23 mg/dL — ABNORMAL HIGH (ref 6–20)
CO2: 26 mmol/L (ref 22–32)
Calcium: 8.5 mg/dL — ABNORMAL LOW (ref 8.9–10.3)
Chloride: 104 mmol/L (ref 101–111)
Creatinine, Ser: 0.74 mg/dL (ref 0.44–1.00)
GFR calc Af Amer: >60 mL/min (ref >60)
GFR calc non Af Amer: >60 mL/min (ref >60)
Glucose, Bld: 89 mg/dL (ref 65–99)
Potassium: 3.6 mmol/L (ref 3.5–5.1)
Sodium: 139 mmol/L (ref 135–145)

## 2017-04-08 LAB — CBC
HCT: 37.5 % (ref 36.0–46.0)
Hemoglobin: 12.4 g/dL (ref 12.0–15.0)
MCH: 31.2 pg (ref 26.0–34.0)
MCHC: 33.1 g/dL (ref 30.0–36.0)
MCV: 94.2 fL (ref 78.0–100.0)
Platelets: 150 10*3/uL (ref 150–400)
RBC: 3.98 MIL/uL (ref 3.87–5.11)
RDW: 16.8 % — ABNORMAL HIGH (ref 11.5–15.5)
WBC: 12.4 10*3/uL — ABNORMAL HIGH (ref 4.0–10.5)

## 2017-04-08 LAB — TROPONIN I: Troponin I: 0.74 ng/mL (ref <0.03)

## 2017-04-08 MED ORDER — BUDESONIDE 0.5 MG/2ML IN SUSP
0.5000 mg | Freq: Two times a day (BID) | RESPIRATORY_TRACT | Status: DC
  Administered 2017-04-09 – 2017-04-10 (×3): 0.5 mg via RESPIRATORY_TRACT
  Filled 2017-04-08 (×3): qty 2

## 2017-04-08 MED ORDER — AMIODARONE LOAD VIA INFUSION
150.0000 mg | Freq: Once | INTRAVENOUS | Status: AC
  Administered 2017-04-08: 150 mg via INTRAVENOUS
  Filled 2017-04-08: qty 83.34

## 2017-04-08 MED ORDER — AMIODARONE HCL IN DEXTROSE 360-4.14 MG/200ML-% IV SOLN
30.0000 mg/h | INTRAVENOUS | Status: DC
  Administered 2017-04-09 (×2): 30 mg/h via INTRAVENOUS
  Filled 2017-04-08 (×3): qty 200

## 2017-04-08 MED ORDER — HEPARIN (PORCINE) IN NACL 100-0.45 UNIT/ML-% IJ SOLN: 8.0000 [IU]/kg/h | INTRAMUSCULAR | Status: DC

## 2017-04-08 MED ORDER — FUROSEMIDE 10 MG/ML IJ SOLN
40.0000 mg | Freq: Once | INTRAMUSCULAR | Status: AC
  Administered 2017-04-08: 40 mg via INTRAVENOUS
  Filled 2017-04-08: qty 4

## 2017-04-08 MED ORDER — DILTIAZEM HCL-DEXTROSE 100-5 MG/100ML-% IV SOLN (PREMIX): 5.0000 mg/h | INTRAVENOUS | Status: DC

## 2017-04-08 MED ORDER — HEPARIN (PORCINE) IN NACL 100-0.45 UNIT/ML-% IJ SOLN
1100.0000 [IU]/h | INTRAMUSCULAR | Status: DC
  Filled 2017-04-08: qty 250

## 2017-04-08 MED ORDER — LEVALBUTEROL HCL 0.63 MG/3ML IN NEBU
0.6300 mg | INHALATION_SOLUTION | RESPIRATORY_TRACT | Status: DC | PRN
  Administered 2017-04-09: 0.63 mg via RESPIRATORY_TRACT
  Filled 2017-04-08: qty 3

## 2017-04-08 MED ORDER — DILTIAZEM HCL-DEXTROSE 100-5 MG/100ML-% IV SOLN (PREMIX)
5.0000 mg/h | INTRAVENOUS | Status: DC
  Filled 2017-04-08: qty 100

## 2017-04-08 MED ORDER — AMIODARONE HCL IN DEXTROSE 360-4.14 MG/200ML-% IV SOLN
60.0000 mg/h | INTRAVENOUS | Status: DC
Start: 2017-04-08 — End: 2017-04-09
  Administered 2017-04-08 – 2017-04-09 (×2): 60 mg/h via INTRAVENOUS
  Filled 2017-04-08: qty 200

## 2017-04-08 MED ORDER — AMIODARONE HCL IN DEXTROSE 360-4.14 MG/200ML-% IV SOLN
INTRAVENOUS | Status: AC
Start: 2017-04-08 — End: 2017-04-09
  Filled 2017-04-08: qty 200

## 2017-04-08 MED ORDER — PHENYLEPHRINE 40 MCG/ML (10ML) SYRINGE FOR IV PUSH (FOR BLOOD PRESSURE SUPPORT)
200.0000 ug | PREFILLED_SYRINGE | INTRAVENOUS | Status: AC
  Administered 2017-04-08: 180 ug via INTRAVENOUS
  Filled 2017-04-08: qty 5

## 2017-04-08 NOTE — Plan of Care (Signed)
Pt. Alert and oriented, understands to call for assistance from staff when needed. See notes for cardiac event. Pt. Transferred to ICU, higher level or care.

## 2017-04-08 NOTE — Consult Note (Signed)
Name: Dana Horton MRN: [id number] DOB: [birth year]    ADMISSION DATE:  04/07/2017 CONSULTATION DATE:  04/08/17  REFERRING MD :  Teena Dunk - Cardiology  CHIEF COMPLAINT:  AFib RVR with hypotension   HISTORY OF PRESENT ILLNESS:  Dana Horton is a 72 y.o. female with a PMH as outlined below.  She was admitted 1/13 as a transfer from [place] due to A.fib with RVR and chest pain due to NSTEMI.  She was started on amiodarone and converted to NSR and plans were to take her for cardiac cath 1/14.  AM 1/14, she had some fluid overload and required BiPAP; therefore, cath was postponed.  That night, she received an albuterol treatment and shortly thereafter, reverted to AF RVR with hypotension (SBP in 80's).  She was given 500cc IVF bolus, 40cc phenylephrine push, and started back on amiodarone.  She is to be transferred to the CCU incase she were to require DCCV.  PCCM was consulted due to hypotension with some mild respiratory distress.  At the time of our arrival at bedside, pt is on CPAP (wears chronically) and is breathing comfortably.  MAP currently 60's, HR 150's. Amiodarone bolus being started and CCU transfer pending.   PAST MEDICAL HISTORY :   has a past medical history of Allergy, Arthritis, rheumatoid ([place]), Atrial fibrillation ([place]), Cancer ([place]), COPD (chronic obstructive pulmonary disease) ([place]), Essential hypertension, Heart murmur, Lung nodule, Myocardial infarction ([place]), Osteoporosis, Pneumonia, Seasonal allergies, Sleep apnea, and Tobacco abuse.  has a past surgical history that includes Bladder surgery; Abdominal hysterectomy; Mouth surgery; Colonoscopy (N/A, 12/28/2016); and polypectomy (N/A, 12/28/2016). Prior to Admission medications   Medication Sig Start Date End Date Taking? Authorizing Provider  albuterol (PROVENTIL) (2.5 MG/3ML) 0.083% nebulizer solution Take 2.5 mg by nebulization 2 (two) times daily as needed for wheezing or shortness of breath.     Yes [provider]  apixaban (ELIQUIS) 5 MG TABS tablet Take 1 tablet (5 mg total) 2 (two) times daily by mouth. 01/29/17  Yes Skeet Latch, MD  Calcium Carb-Cholecalciferol (CALCIUM 600/VITAMIN D3 PO) Take 1 tablet by mouth 2 (two) times daily.   Yes [provider]  cetirizine (ZYRTEC) 10 MG tablet Take 10 mg by mouth daily.   Yes [provider]  flecainide (TAMBOCOR) 50 MG tablet Take 1 tablet (50 mg total) 2 (two) times daily by mouth. START MEDICATION ON 02/05/17 01/29/17  Yes Skeet Latch, MD  fluticasone [facility]) 50 MCG/ACT nasal spray Place 2 sprays into both nostrils daily.   Yes [provider]  hydrochlorothiazide (HYDRODIURIL) 25 MG tablet Take 25 mg by mouth daily.   Yes [provider]  metoprolol succinate (TOPROL-XL) 50 MG 24 hr tablet Take 1 tablet (50 mg total) by mouth at bedtime. Take with or immediately following a meal. Patient taking differently: Take 25-50 mg by mouth See admin instructions. Take 25 mg in the morning and 50 mg in the evening 10/24/16  Yes Skeet Latch, MD  predniSONE (STERAPRED UNI-PAK 48 TAB) 10 MG (48) TBPK tablet Take 10 mg by mouth See admin instructions. Take 60 mg (6 tablets) for 4 days then take 40 mg (4 tablets) for 4 days then take 20 mg (2 tablets) for 4 days.   Yes [provider]  SYMBICORT 160-4.5 MCG/ACT inhaler Inhale 2 puffs into the lungs 2 (two) times daily. 03/20/17  Yes [provider]  metoprolol tartrate (LOPRESSOR) 25 MG tablet Take 25 mg by mouth every morning.    [provider]   Allergies  Allergen Reactions  . Lisinopril Cough    FAMILY HISTORY:  family history includes Arrhythmia in her father; Bladder Cancer in her brother; CAD in her father; Cancer in her father; Heart disease in her father; Hypertension in her mother; Lung cancer in her brother; Stroke in her mother. SOCIAL HISTORY:  reports that she has been smoking.  She has a 26.00  pack-year smoking history. she has never used smokeless tobacco. She reports that she does not drink alcohol or use drugs.  REVIEW OF SYSTEMS:   All negative; except for those that are bolded, which indicate positives.  Constitutional: weight loss, weight gain, night sweats, fevers, chills, fatigue, weakness.  HEENT: headaches, sore throat, sneezing, nasal congestion, post nasal drip, difficulty swallowing, tooth/dental problems, visual complaints, visual changes, ear aches. Neuro: difficulty with speech, weakness, numbness, ataxia. CV:  chest pain, orthopnea, PND, swelling in lower extremities, dizziness, palpitations, syncope.  Resp: cough, hemoptysis, dyspnea, wheezing. GI: heartburn, indigestion, abdominal pain, nausea, vomiting, diarrhea, constipation, change in bowel habits, loss of appetite, hematemesis, melena, hematochezia.  GU: dysuria, change in color of urine, urgency or frequency, flank pain, hematuria. MSK: joint pain or swelling, decreased range of motion. Psych: change in mood or affect, depression, anxiety, suicidal ideations, homicidal ideations. Skin: rash, itching, bruising.   SUBJECTIVE:  Denies dyspnea.  Has chest tightness still.  VITAL SIGNS: Temp:  [98.1 F (36.7 C)-99 F (37.2 C)] 98.1 F (36.7 C) (01/14 2006) Pulse Rate:  [78-93] 89 (01/14 2006) Resp:  [18-20] 20 (01/14 2006) BP: (101-145)/(42-95) 145/95 (01/14 2006) SpO2:  [92 %-100 %] 100 % (01/14 2006) Weight:  [75.4 kg (166 lb 4.8 oz)] 75.4 kg (166 lb 4.8 oz) (01/14 0459)  PHYSICAL EXAMINATION: General: Adult female, resting in bed, in NAD. Neuro: A&O x 3, non-focal.  HEENT: [place]/AT. EOMI, sclerae anicteric. CPAP in place. Cardiovascular: Tachy, Mariel Sleet, no M/R/G.  Lungs: Respirations even and unlabored.  CTA bilaterally, No W/R/R. Abdomen: BS x 4, soft, NT/ND.  Musculoskeletal: No gross deformities, no edema.  Skin: Intact, cool, no rashes.    Recent Labs  Lab 04/08/17 0538  NA 139  K 3.6  CL  104  CO2 26  BUN 23*  CREATININE 0.74  GLUCOSE 89   Recent Labs  Lab 04/08/17 0538  HGB 12.4  HCT 37.5  WBC 12.4*  PLT 150   No results found.  STUDIES:  None.  SIGNIFICANT EVENTS  1/13 > admit. 1/14 > A.Fib RVR > rebolused with amio > transferred to CCU.  ASSESSMENT / PLAN:  A.fib with RVR - had been NSR but reverted to [facility] after albuterol treatment. Plan: Cardiology following. Amio being re-bolused then maintenance to continue. Continue BB, heparin. If does not convert after amio bolus, may need DCCV per cardiology. Might also start low dose vasopressors if hypotension persists (would favor low dose via PIV versus placing CVL as anticipate need would be very short term).  NSTEMI. Plan: Cath planned for 1/15 (was initially 1/14 but postponed due to fluid overload and BiPAP needs).  OSA - on CPAP. Plan: Continue nocturnal CPAP.  COPD. Plan: D/c albuterol, switch to levalbuterol. Will start budesonide in lieu of preadmission symbicort.  Rest per primary team.   Montey Hora, [place] [facility] Pager: [phone]  or [phone] 04/08/2017, 10:26 PM

## 2017-04-08 NOTE — Progress Notes (Signed)
[place] for enoxaparin>>heparin Indication: atrial fibrillation  Allergies  Allergen Reactions  . Lisinopril Cough    Patient Measurements: Height: 5\' 3"  (160 cm) Weight: 166 lb 4.8 oz (75.4 kg)(scale a) IBW/kg (Calculated) : 52.4   Vital Signs: Temp: 98.1 F (36.7 C) (01/14 2006) Temp Source: Oral (01/14 2006) BP: 145/95 (01/14 2006) Pulse Rate: 89 (01/14 2006)  Labs: Recent Labs    04/08/17 0538  HGB 12.4  HCT 37.5  PLT 150  CREATININE 0.74    Estimated Creatinine Clearance: 62.7 mL/min (by C-G formula based on SCr of 0.74 mg/dL).   Medical History: Past Medical History:  Diagnosis Date  . Allergy   . Arthritis, rheumatoid ([place])   . Atrial fibrillation ([place])   . Cancer ([place])    bladder  . COPD (chronic obstructive pulmonary disease) ([place])   . Essential hypertension   . Heart murmur   . Lung nodule   . Myocardial infarction [facility])    ? small with atrial fib   . Osteoporosis   . Pneumonia    hx of   . Seasonal allergies   . Sleep apnea    cpap  setting at 2.5 per patient   . Tobacco abuse     Medications:  Scheduled:  . amiodarone  150 mg Intravenous Once  . atorvastatin  80 mg Oral q1800  . enoxaparin (LOVENOX) injection  1 mg/kg Subcutaneous Q12H  . fluticasone  2 spray Each Nare Daily  . loratadine  10 mg Oral Daily  . metoprolol succinate  50 mg Oral QHS  . phenylephrine  200 mcg Intravenous STAT  . sodium chloride flush  3 mL Intravenous Q12H    Assessment: 74 yof with history of atrial fibrillation transferred to [facility] from [facility]. Patient was on apixaban at OSH. Confirmed with paper chart and in-patient pharmacist at OSH that last dose of apixaban 5 mg was on 1/13 at 0300. Renal function from OSH showed Scr of 0.9 (CrCl ~65 mL/min).   Patient not ready for cath today, needing IV diuresis, possible cath in am. Afib with rvr and hypotension noted from rapid response note. New orders received  for amio infusion and transition to IV heparin.  Patient received sq lovenox ~6pm earlier today, will start IV heparin closer to 2am 1/15 which would be 8 hours from last dose. Follow up cath plan in am.   Given that apixaban was taken on 1/13  Goal of Therapy:  Heparin level 0.3-0.7 Aptt goal 66-102s Monitor platelets by anticoagulation protocol: Yes   Plan:  Start heparin at 1000 units/hr at 0200  Check heparin level and aptt 8 hours after infusion starts  Erin Hearing PharmD., BCPS Clinical Pharmacist Phone [phone] 04/08/2017 10:08 PM

## 2017-04-08 NOTE — Plan of Care (Signed)
  Progressing Safety: Ability to remain free from injury will improve 04/08/2017 0313 - Progressing by Ardine Eng, RN

## 2017-04-08 NOTE — Progress Notes (Addendum)
D/w Rapid  Response  RN  Afib rvr , hypotension Orders  Placed  For  Hep gtt, fluid  Bolus, Vasopressors, AMIO  GTT CCu transfer, CCM consult  For respiratory support  And possible centraL line  Pt  Scheduled for cath AM per  Earlier  Plan   Fluid  Bolus  Per  RR  RN    BP improved to 90/60 ,  RR 26-28 D/w  CCM e  Link  For  Central line placement  CCU transfer  eval  For  Hep gtt, amio  Bolus  Will be  Given

## 2017-04-08 NOTE — Progress Notes (Signed)
Rapid Response called and on the way to pts. Bedside. New orders received from Cardiology on call.

## 2017-04-08 NOTE — Progress Notes (Signed)
Progress Note  Patient Name: Dana Horton Date of Encounter: 04/08/2017  Primary Cardiologist: Oval Linsey   Subjective   No chest pain, but still dyspneic this morning.   Inpatient Medications    Scheduled Meds: . atorvastatin  80 mg Oral q1800  . enoxaparin (LOVENOX) injection  1 mg/kg Subcutaneous Q12H  . fluticasone  2 spray Each Nare Daily  . loratadine  10 mg Oral Daily  . metoprolol succinate  50 mg Oral QHS  . sodium chloride flush  3 mL Intravenous Q12H   Continuous Infusions: . sodium chloride    . sodium chloride 1 mL/kg/hr (04/08/17 0647)   PRN Meds: sodium chloride, acetaminophen, albuterol, ondansetron (ZOFRAN) IV, sodium chloride flush   Vital Signs    Vitals:   04/07/17 1625 04/07/17 1912 04/08/17 0024 04/08/17 0459  BP:  (!) 126/95 (!) 110/42 (!) 127/52  Pulse:  87 82 79  Resp:  20 20 20   Temp:  98.4 F (36.9 C) 99 F (37.2 C) 98.6 F (37 C)  TempSrc:  Oral Oral Oral  SpO2: (!) 88% 93% 93% 92%  Weight:    166 lb 4.8 oz (75.4 kg)  Height:        Intake/Output Summary (Last 24 hours) at 04/08/2017 1012 Last data filed at 04/08/2017 0859 Gross per 24 hour  Intake 661.05 ml  Output 1400 ml  Net -738.95 ml   Filed Weights   04/07/17 1300 04/08/17 0459  Weight: 167 lb 15.9 oz (76.2 kg) 166 lb 4.8 oz (75.4 kg)    Telemetry    SR - Personally Reviewed  ECG    SR - Personally Reviewed  Physical Exam   General: Older W female appearing in no acute distress., but dyspneic at rest Head: Normocephalic, atraumatic.  Neck: Supple, JVD is elevated. Lungs:  Resp regular, but labored at rest, diminished in bases with crackles and wheezing. Heart: RRR, S1, S2, no S3, S4, or murmur; no rub. Abdomen: Soft, non-tender, non-distended with normoactive bowel sounds. Extremities: No clubbing, cyanosis, edema. Distal pedal pulses are 2+ bilaterally. Neuro: Alert and oriented X 3. Moves all extremities spontaneously. Psych: Normal  affect.  Labs    Chemistry Recent Labs  Lab 04/08/17 0538  NA 139  K 3.6  CL 104  CO2 26  GLUCOSE 89  BUN 23*  CREATININE 0.74  CALCIUM 8.5*  GFRNONAA >60  GFRAA >60  ANIONGAP 9     Hematology Recent Labs  Lab 04/08/17 0538  WBC 12.4*  RBC 3.98  HGB 12.4  HCT 37.5  MCV 94.2  MCH 31.2  MCHC 33.1  RDW 16.8*  PLT 150    Cardiac EnzymesNo results for input(s): TROPONINI in the last 168 hours. No results for input(s): TROPIPOC in the last 168 hours.   BNPNo results for input(s): BNP, PROBNP in the last 168 hours.   DDimer No results for input(s): DDIMER in the last 168 hours.    Radiology    No results found.  Cardiac Studies     Patient Profile     72 y.o. female with PMH of PAF, HTN, COPD, Bladder Ca and OSA on Cpap who presented with chest pain and Afib RVR. Found to have positive troponins with plan for cath.   Assessment & Plan    1. Afib RVR: Remains in SR. Eliquis on hold given plans for cardiac cath. On sq Lovenox for anticoagulation. Continue BB. CHA2DS2-VASc Score of at least 2.  2. NSTEMI: Trop peaked at 7  while at [place]. No chest pain. Planned for cath today, but dyspneic at rest with crackles on exam. Will reschedule for tomorrow pending respiratory status.  -- check echo, does not appear to have had one at OSH.   3. HTN: stable with current therapy  4. Dyspnea: CXR prior to admission showed small left side pleural effusion. Given small dose of lasix at OSH. More short of breath this morning with crackles on exam.  -- stop IVFs, give 40mg  IV lasix x1 now  5. OSA on Cpap: compliant  6. HL: on statin  7. COPD: Uses nebs at home. Will ask that she get treatment now.    Signed, Reino Bellis, NP  04/08/2017, 10:12 AM  Pager # [phone]   For questions or updates, please contact [place] Please consult [email or web address] for contact info under Cardiology/STEMI.

## 2017-04-08 NOTE — Progress Notes (Signed)
Pt. HR elevated, and hypotensive. Pt. SOB, RR 32.

## 2017-04-08 NOTE — Progress Notes (Signed)
RN informed by CCMD that pt. With elevated HR of 150s-60s, and pt. Converted to afib. Upon assessment Pt. C/o CP, SOB, and tingling in both arms. Cardiology paged to make aware. EKG obtained.

## 2017-04-09 ENCOUNTER — Inpatient Hospital Stay (HOSPITAL_COMMUNITY): Payer: Medicare Other

## 2017-04-09 ENCOUNTER — Inpatient Hospital Stay (HOSPITAL_COMMUNITY)
Admission: AD | Disposition: A | Discharge status: Home / Self Care | Payer: Self-pay | Source: Other Acute Inpatient Hospital | Attending: Internal Medicine

## 2017-04-09 ENCOUNTER — Encounter (HOSPITAL_COMMUNITY): Payer: Self-pay | Admitting: Interventional Cardiology

## 2017-04-09 DIAGNOSIS — R7989 Other specified abnormal findings of blood chemistry: Secondary | ICD-10-CM | POA: Diagnosis present

## 2017-04-09 DIAGNOSIS — R778 Other specified abnormalities of plasma proteins: Secondary | ICD-10-CM | POA: Diagnosis present

## 2017-04-09 HISTORY — PX: LEFT HEART CATH AND CORONARY ANGIOGRAPHY: CATH118249

## 2017-04-09 LAB — BASIC METABOLIC PANEL
Anion gap: 9 (ref 5–15)
BUN: 21 mg/dL — ABNORMAL HIGH (ref 6–20)
CO2: 26 mmol/L (ref 22–32)
Calcium: 8.2 mg/dL — ABNORMAL LOW (ref 8.9–10.3)
Chloride: 104 mmol/L (ref 101–111)
Creatinine, Ser: 0.82 mg/dL (ref 0.44–1.00)
GFR calc Af Amer: >60 mL/min (ref >60)
GFR calc non Af Amer: >60 mL/min (ref >60)
Glucose, Bld: 121 mg/dL — ABNORMAL HIGH (ref 65–99)
Potassium: 3.6 mmol/L (ref 3.5–5.1)
Sodium: 139 mmol/L (ref 135–145)

## 2017-04-09 LAB — HEPARIN LEVEL (UNFRACTIONATED): Heparin Unfractionated: 0.61 IU/mL (ref 0.30–0.70)

## 2017-04-09 LAB — POCT ACTIVATED CLOTTING TIME: Activated Clotting Time: 136 seconds

## 2017-04-09 LAB — MRSA PCR SCREENING: MRSA by PCR: NEGATIVE

## 2017-04-09 LAB — APTT: aPTT: 64 seconds — ABNORMAL HIGH (ref 24–36)

## 2017-04-09 SURGERY — LEFT HEART CATH AND CORONARY ANGIOGRAPHY
Anesthesia: LOCAL

## 2017-04-09 MED ORDER — HEPARIN (PORCINE) IN NACL 2-0.9 UNIT/ML-% IJ SOLN
INTRAMUSCULAR | Status: DC | PRN
  Administered 2017-04-09: 15:00:00

## 2017-04-09 MED ORDER — SODIUM CHLORIDE 0.9 % IV BOLUS (SEPSIS): 750.0000 mL | Freq: Once | INTRAVENOUS | Status: DC

## 2017-04-09 MED ORDER — ONDANSETRON HCL 4 MG/2ML IJ SOLN: 4.0000 mg | Freq: Four times a day (QID) | INTRAMUSCULAR | Status: DC | PRN

## 2017-04-09 MED ORDER — IOPAMIDOL (ISOVUE-370) INJECTION 76%
INTRAVENOUS | Status: DC | PRN
  Administered 2017-04-09: 30 mL

## 2017-04-09 MED ORDER — HEPARIN (PORCINE) IN NACL 100-0.45 UNIT/ML-% IJ SOLN
1100.0000 [IU]/h | INTRAMUSCULAR | Status: DC
Start: 2017-04-09 — End: 2017-04-10
  Administered 2017-04-09: 1100 [IU]/h via INTRAVENOUS
  Filled 2017-04-09: qty 250

## 2017-04-09 MED ORDER — SODIUM CHLORIDE 0.9% FLUSH
3.0000 mL | Freq: Two times a day (BID) | INTRAVENOUS | Status: DC
  Administered 2017-04-09 – 2017-04-13 (×8): 3 mL via INTRAVENOUS

## 2017-04-09 MED ORDER — VERAPAMIL HCL 2.5 MG/ML IV SOLN
INTRAVENOUS | Status: AC
  Filled 2017-04-09: qty 2

## 2017-04-09 MED ORDER — MIDAZOLAM HCL 2 MG/2ML IJ SOLN
INTRAMUSCULAR | Status: AC
  Filled 2017-04-09: qty 2

## 2017-04-09 MED ORDER — ACETAMINOPHEN 325 MG PO TABS
650.0000 mg | ORAL_TABLET | ORAL | Status: DC | PRN
  Administered 2017-04-10 – 2017-04-13 (×3): 650 mg via ORAL
  Filled 2017-04-09 (×3): qty 2

## 2017-04-09 MED ORDER — SODIUM CHLORIDE 0.9% FLUSH: 3.0000 mL | INTRAVENOUS | Status: DC | PRN

## 2017-04-09 MED ORDER — ORAL CARE MOUTH RINSE
15.0000 mL | Freq: Two times a day (BID) | OROMUCOSAL | Status: DC
  Administered 2017-04-13: 15 mL via OROMUCOSAL

## 2017-04-09 MED ORDER — FENTANYL CITRATE (PF) 100 MCG/2ML IJ SOLN
INTRAMUSCULAR | Status: DC | PRN
  Administered 2017-04-09: 25 ug via INTRAVENOUS

## 2017-04-09 MED ORDER — LIDOCAINE HCL (PF) 1 % IJ SOLN
INTRAMUSCULAR | Status: DC | PRN
  Administered 2017-04-09: 15 mL
  Administered 2017-04-09: 2 mL

## 2017-04-09 MED ORDER — LIDOCAINE HCL (PF) 1 % IJ SOLN
INTRAMUSCULAR | Status: AC
  Filled 2017-04-09: qty 30

## 2017-04-09 MED ORDER — MIDAZOLAM HCL 2 MG/2ML IJ SOLN
INTRAMUSCULAR | Status: DC | PRN
  Administered 2017-04-09: 1 mg via INTRAVENOUS

## 2017-04-09 MED ORDER — HEPARIN (PORCINE) IN NACL 2-0.9 UNIT/ML-% IJ SOLN
INTRAMUSCULAR | Status: AC
  Filled 2017-04-09: qty 1000

## 2017-04-09 MED ORDER — FENTANYL CITRATE (PF) 100 MCG/2ML IJ SOLN
INTRAMUSCULAR | Status: AC
  Filled 2017-04-09: qty 2

## 2017-04-09 MED ORDER — CHLORHEXIDINE GLUCONATE 0.12 % MT SOLN
15.0000 mL | Freq: Two times a day (BID) | OROMUCOSAL | Status: DC
  Administered 2017-04-09 – 2017-04-13 (×9): 15 mL via OROMUCOSAL
  Filled 2017-04-09 (×9): qty 15

## 2017-04-09 MED ORDER — SODIUM CHLORIDE 0.9 % IV SOLN: 250.0000 mL | INTRAVENOUS | Status: DC | PRN

## 2017-04-09 MED ORDER — IOPAMIDOL (ISOVUE-370) INJECTION 76%
INTRAVENOUS | Status: AC
  Filled 2017-04-09: qty 100

## 2017-04-09 SURGICAL SUPPLY — 14 items
CATH IMPULSE 5F ANG/FL3.5 (CATHETERS) ×1 IMPLANT
CATH INFINITI 5FR JL4 (CATHETERS) ×1 IMPLANT
COVER PRB 48X5XTLSCP FOLD TPE (BAG) IMPLANT
COVER PROBE 5X48 (BAG) ×2
DEVICE RAD COMP TR BAND LRG (VASCULAR PRODUCTS) ×1 IMPLANT
GLIDESHEATH SLEND SS 6F .021 (SHEATH) ×1 IMPLANT
GUIDEWIRE INQWIRE 1.5J.035X260 (WIRE) IMPLANT
INQWIRE 1.5J .035X260CM (WIRE) ×2
KIT HEART LEFT (KITS) ×2 IMPLANT
PACK CARDIAC CATHETERIZATION (CUSTOM PROCEDURE TRAY) ×2 IMPLANT
SHEATH PINNACLE 5F 10CM (SHEATH) ×1 IMPLANT
TRANSDUCER W/STOPCOCK (MISCELLANEOUS) ×2 IMPLANT
TUBING CIL FLEX 10 FLL-RA (TUBING) ×2 IMPLANT
WIRE .035 3MM-J 145CM (WIRE) ×1 IMPLANT

## 2017-04-09 NOTE — H&P (View-Only) (Signed)
Progress Note  Patient Name: Dana Horton Date of Encounter: 04/09/2017  Primary Cardiologist: [place] into Afib RVR overnight, became hypotensive and dyspneic -apparently this was shortly after using albuterol. Transferred to ICU, placed on amiodarone drip --> now back in SR. Breathing improved.  Still feels "congested "but better after nebulizer. Diuresed well - but not accurately recorded  Inpatient Medications    Scheduled Meds: . atorvastatin  80 mg Oral q1800  . budesonide (PULMICORT) nebulizer solution  0.5 mg Nebulization BID  . chlorhexidine  15 mL Mouth Rinse BID  . fluticasone  2 spray Each Nare Daily  . loratadine  10 mg Oral Daily  . mouth rinse  15 mL Mouth Rinse q12n4p  . metoprolol succinate  50 mg Oral QHS  . sodium chloride flush  3 mL Intravenous Q12H   Continuous Infusions: . sodium chloride    . amiodarone 30 mg/hr (04/09/17 0432)  . heparin    . sodium chloride     PRN Meds: sodium chloride, acetaminophen, levalbuterol, ondansetron (ZOFRAN) IV, sodium chloride flush   Vital Signs    Vitals:   04/09/17 0800 04/09/17 0814 04/09/17 0830 04/09/17 0900  BP: (!) 130/50  101/60 (!) 116/55  Pulse: 69  67 69  Resp: (!) 25  (!) 26 (!) 22  Temp:  98 F (36.7 C)    TempSrc:  Oral    SpO2: 99%  99% 99%  Weight:      Height:        Intake/Output Summary (Last 24 hours) at 04/09/2017 0909 Last data filed at 04/09/2017 0800 Gross per 24 hour  Intake 744.18 ml  Output 1000 ml  Net -255.82 ml   Filed Weights   04/07/17 1300 04/08/17 0459 04/09/17 0437  Weight: 167 lb 15.9 oz (76.2 kg) 166 lb 4.8 oz (75.4 kg) 178 lb 9.2 oz (81 kg)    Telemetry    Back in SR - Personally Reviewed  ECG    04/08/17 2114 Afib RVR - Personally Reviewed  Physical Exam   General: Older W female appearing in no acute distress., wearing [place]. Head: Normocephalic, atraumatic.  Neck: Supple, no JVD. Lungs:  Resp regular and  unlabored, wheezing bilaterally Heart: RRR, S1, S2, no S3, S4, systolic murmur; no rub. Abdomen: Soft, non-tender, non-distended with normoactive bowel sounds. Extremities: No clubbing, cyanosis, edema. Distal pedal pulses are 2+ bilaterally. Neuro: Alert and oriented X 3. Moves all extremities spontaneously. Psych: Normal affect.  Labs    Chemistry Recent Labs  Lab 04/08/17 0538 04/09/17 0229  NA 139 139  K 3.6 3.6  CL 104 104  CO2 26 26  GLUCOSE 89 121*  BUN 23* 21*  CREATININE 0.74 0.82  CALCIUM 8.5* 8.2*  GFRNONAA >60 >60  GFRAA >60 >60  ANIONGAP 9 9     Hematology Recent Labs  Lab 04/08/17 0538  WBC 12.4*  RBC 3.98  HGB 12.4  HCT 37.5  MCV 94.2  MCH 31.2  MCHC 33.1  RDW 16.8*  PLT 150    Cardiac Enzymes Recent Labs  Lab 04/08/17 2241  TROPONINI 0.74*   No results for input(s): TROPIPOC in the last 168 hours.   BNPNo results for input(s): BNP, PROBNP in the last 168 hours.   DDimer No results for input(s): DDIMER in the last 168 hours.    Radiology    No results found.  Cardiac Studies   N/a   Patient Profile  72 y.o. female  with PMH of PAF, HTN, COPD, Bladder Ca and OSA on Cpap who presented with chest pain and Afib RVR. Found to have positive troponins with plan for cath.  Assessment & Plan    1. Afib RVR: Went back into Afib RVR last night with hypotension and dyspnea. Moved to the Unit, placed on IV amiodarone, and Dilt. Back in SR this morning. Remains on IV amiodarone. Eliquis on hold given plans for cardiac cath.  Now on heparin. Continue BB. CHA2DS2-VASc Score of at least 2. May need EP input regarding therapy as she was on Flecainide prior to admission.   Per initial evaluation by Dr. Lovena Le, amiodarone is probably not a good long-term option for her.  We need to determine if there is any evidence of CAD.  This will allow [place] to determine if she can go back on flecainide or will need to consider dofetilide. ->  Either way, will  need to be off of amiodarone.  (Will ask the Electrophysiology to formally consult following cath  2. NSTEMI: Trop peaked at 7 while at [facility]. No chest pain. Planned for cath yesterday, but dyspneic at rest with crackles on exam. Still with some wheezing, but improved form yesterday. Will proceed with cath today.   3. HTN: stable with current therapy  4. Dyspnea: CXR prior to admission showed small left side pleural effusion. Given IV lasix yesterday with improvement. Had 1L UOP. Will hold on further diuresis today. --We will check chest x-ray today  5. OSA on Cpap: compliant  6. HL: on statin  7. COPD: Given albuterol tx prior to episode of Afib RVR. Seen by PCCM. Switched to [employer]. Along with Pulmicort   Signed, Reino Bellis, NP  04/09/2017, 9:09 AM  Pager # [phone]   For questions or updates, please contact [place] Please consult [email or web address] for contact info under Cardiology/STEMI.    I have seen, examined and evaluated the patient this AM on rounds along with Reino Bellis, NP-C.  After reviewing all the available data and chart, we discussed the patients laboratory, study & physical findings as well as symptoms in detail. I agree with her findings, examination as well as impression recommendations as per our discussion.    Attending adjustments noted in italics.   Overall, she seems to be improved from a restaurant standpoint despite having been moved to the ICU.  This is more because of hypotension in A. fib RVR.  Notably improved from a blood pressure and rest for standpoint. At this point, I think we need to answer the question with the positive troponin to determine if there is any evidence of coronary disease.  She is currently on IV heparin which can be changed back to DOAC pending the results of the cath.  I am ordering a chest x-ray to exclude anything else such as pneumonia or bronchitis.  For now continue with Xopenex and Pulmicort.  Once we  have an answer about possible coronary disease, will reconsult EP as to the best option for antiarrhythmics.   Glenetta Hew, M.D., M.S. Interventional Cardiologist   Pager # [phone] Phone # [phone] [address]. [place], [place] [address]

## 2017-04-09 NOTE — Progress Notes (Signed)
Pt placed on home CPAP when pt arrived in unit.  Pt tolerating well.

## 2017-04-09 NOTE — Progress Notes (Signed)
Progress Note  Patient Name: Dana Horton Date of Encounter: 04/09/2017  Primary Cardiologist: [place] into Afib RVR overnight, became hypotensive and dyspneic -apparently this was shortly after using albuterol. Transferred to ICU, placed on amiodarone drip --> now back in SR. Breathing improved.  Still feels "congested "but better after nebulizer. Diuresed well - but not accurately recorded  Inpatient Medications    Scheduled Meds: . atorvastatin  80 mg Oral q1800  . budesonide (PULMICORT) nebulizer solution  0.5 mg Nebulization BID  . chlorhexidine  15 mL Mouth Rinse BID  . fluticasone  2 spray Each Nare Daily  . loratadine  10 mg Oral Daily  . mouth rinse  15 mL Mouth Rinse q12n4p  . metoprolol succinate  50 mg Oral QHS  . sodium chloride flush  3 mL Intravenous Q12H   Continuous Infusions: . sodium chloride    . amiodarone 30 mg/hr (04/09/17 0432)  . heparin    . sodium chloride     PRN Meds: sodium chloride, acetaminophen, levalbuterol, ondansetron (ZOFRAN) IV, sodium chloride flush   Vital Signs    Vitals:   04/09/17 0800 04/09/17 0814 04/09/17 0830 04/09/17 0900  BP: (!) 130/50  101/60 (!) 116/55  Pulse: 69  67 69  Resp: (!) 25  (!) 26 (!) 22  Temp:  98 F (36.7 C)    TempSrc:  Oral    SpO2: 99%  99% 99%  Weight:      Height:        Intake/Output Summary (Last 24 hours) at 04/09/2017 0909 Last data filed at 04/09/2017 0800 Gross per 24 hour  Intake 744.18 ml  Output 1000 ml  Net -255.82 ml   Filed Weights   04/07/17 1300 04/08/17 0459 04/09/17 0437  Weight: 167 lb 15.9 oz (76.2 kg) 166 lb 4.8 oz (75.4 kg) 178 lb 9.2 oz (81 kg)    Telemetry    Back in SR - Personally Reviewed  ECG    04/08/17 2114 Afib RVR - Personally Reviewed  Physical Exam   General: Older W female appearing in no acute distress., wearing . Head: Normocephalic, atraumatic.  Neck: Supple, no JVD. Lungs:  Resp regular and  unlabored, wheezing bilaterally Heart: RRR, S1, S2, no S3, S4, systolic murmur; no rub. Abdomen: Soft, non-tender, non-distended with normoactive bowel sounds. Extremities: No clubbing, cyanosis, edema. Distal pedal pulses are 2+ bilaterally. Neuro: Alert and oriented X 3. Moves all extremities spontaneously. Psych: Normal affect.  Labs    Chemistry Recent Labs  Lab 04/08/17 0538 04/09/17 0229  NA 139 139  K 3.6 3.6  CL 104 104  CO2 26 26  GLUCOSE 89 121*  BUN 23* 21*  CREATININE 0.74 0.82  CALCIUM 8.5* 8.2*  GFRNONAA >60 >60  GFRAA >60 >60  ANIONGAP 9 9     Hematology Recent Labs  Lab 04/08/17 0538  WBC 12.4*  RBC 3.98  HGB 12.4  HCT 37.5  MCV 94.2  MCH 31.2  MCHC 33.1  RDW 16.8*  PLT 150    Cardiac Enzymes Recent Labs  Lab 04/08/17 2241  TROPONINI 0.74*   No results for input(s): TROPIPOC in the last 168 hours.   BNPNo results for input(s): BNP, PROBNP in the last 168 hours.   DDimer No results for input(s): DDIMER in the last 168 hours.    Radiology    No results found.  Cardiac Studies   N/a   Patient Profile  72 y.o. female  with PMH of PAF, HTN, COPD, Bladder Ca and OSA on Cpap who presented with chest pain and Afib RVR. Found to have positive troponins with plan for cath.  Assessment & Plan    1. Afib RVR: Went back into Afib RVR last night with hypotension and dyspnea. Moved to the Unit, placed on IV amiodarone, and Dilt. Back in SR this morning. Remains on IV amiodarone. Eliquis on hold given plans for cardiac cath.  Now on heparin. Continue BB. CHA2DS2-VASc Score of at least 2. May need EP input regarding therapy as she was on Flecainide prior to admission.   Per initial evaluation by Dr. Lovena Le, amiodarone is probably not a good long-term option for her.  We need to determine if there is any evidence of CAD.  This will allow [place] to determine if she can go back on flecainide or will need to consider dofetilide. ->  Either way, will  need to be off of amiodarone.  (Will ask the Electrophysiology to formally consult following cath  2. NSTEMI: Trop peaked at 7 while at [facility]. No chest pain. Planned for cath yesterday, but dyspneic at rest with crackles on exam. Still with some wheezing, but improved form yesterday. Will proceed with cath today.   3. HTN: stable with current therapy  4. Dyspnea: CXR prior to admission showed small left side pleural effusion. Given IV lasix yesterday with improvement. Had 1L UOP. Will hold on further diuresis today. --We will check chest x-ray today  5. OSA on Cpap: compliant  6. HL: on statin  7. COPD: Given albuterol tx prior to episode of Afib RVR. Seen by PCCM. Switched to [employer]. Along with Pulmicort   Signed, Reino Bellis, NP  04/09/2017, 9:09 AM  Pager # [phone]   For questions or updates, please contact [place] Please consult [email or web address] for contact info under Cardiology/STEMI.    I have seen, examined and evaluated the patient this AM on rounds along with Reino Bellis, NP-C.  After reviewing all the available data and chart, we discussed the patients laboratory, study & physical findings as well as symptoms in detail. I agree with her findings, examination as well as impression recommendations as per our discussion.    Attending adjustments noted in italics.   Overall, she seems to be improved from a restaurant standpoint despite having been moved to the ICU.  This is more because of hypotension in A. fib RVR.  Notably improved from a blood pressure and rest for standpoint. At this point, I think we need to answer the question with the positive troponin to determine if there is any evidence of coronary disease.  She is currently on IV heparin which can be changed back to DOAC pending the results of the cath.  I am ordering a chest x-ray to exclude anything else such as pneumonia or bronchitis.  For now continue with Xopenex and Pulmicort.  Once we  have an answer about possible coronary disease, will reconsult EP as to the best option for antiarrhythmics.   Glenetta Hew, M.D., M.S. Interventional Cardiologist   Pager # [phone] Phone # [phone] [address]. [place], [place] [address]

## 2017-04-09 NOTE — Plan of Care (Signed)
RN and patient discussed how her being able to cough and clear her airway is a positive sign regarding her respiratory system.

## 2017-04-09 NOTE — Progress Notes (Signed)
[place] for heparin Indication: atrial fibrillation  Allergies  Allergen Reactions  . Lisinopril Cough    Patient Measurements: Height: 5\' 3"  (160 cm) Weight: 178 lb 9.2 oz (81 kg) IBW/kg (Calculated) : 52.4   Vital Signs: Temp: 97.5 F (36.4 C) (01/15 1136) Temp Source: Oral (01/15 1136) BP: 120/61 (01/15 1630) Pulse Rate: 70 (01/15 1630)  Labs: Recent Labs    04/08/17 0538 04/08/17 2241 04/09/17 0229 04/09/17 0912  HGB 12.4  --   --   --   HCT 37.5  --   --   --   PLT 150  --   --   --   APTT  --   --   --  64*  HEPARINUNFRC  --   --   --  0.61  CREATININE 0.74  --  0.82  --   TROPONINI  --  0.74*  --   --     Estimated Creatinine Clearance: 63.4 mL/min (by C-G formula based on SCr of 0.82 mg/dL).   Medical History: Past Medical History:  Diagnosis Date  . Allergy   . Arthritis, rheumatoid ([place])   . Atrial fibrillation ([place])   . Cancer ([place])    bladder  . COPD (chronic obstructive pulmonary disease) ([place])   . Essential hypertension   . Heart murmur   . Lung nodule   . Myocardial infarction [facility])    ? small with atrial fib   . Osteoporosis   . Pneumonia    hx of   . Seasonal allergies   . Sleep apnea    cpap  setting at 2.5 per patient   . Tobacco abuse     Medications:  Scheduled:  . atorvastatin  80 mg Oral q1800  . budesonide (PULMICORT) nebulizer solution  0.5 mg Nebulization BID  . chlorhexidine  15 mL Mouth Rinse BID  . fluticasone  2 spray Each Nare Daily  . loratadine  10 mg Oral Daily  . mouth rinse  15 mL Mouth Rinse q12n4p  . metoprolol succinate  50 mg Oral QHS  . sodium chloride flush  3 mL Intravenous Q12H    Assessment: 13 yof with history of atrial fibrillation transferred to [facility] from [facility]. Patient was on apixaban at OSH. Confirmed with paper chart and in-patient pharmacist at OSH that last dose of apixaban 5 mg was on 1/13 at 0300. Renal function from OSH showed Scr of 0.9  (CrCl ~65 mL/min).   AM - heparin level= 0.61 (possiblie influence of recent apixiban), aPTT= 61  Cath showed no CAD - orders to restart heparin post cath for afib.   Goal of Therapy:  Heparin level 0.3-0.7 Aptt goal 66-102s Monitor platelets by anticoagulation protocol: Yes   Plan:  Restart heparin at 1100 units/hr>>2330 tonight  Erin Hearing PharmD., BCPS Clinical Pharmacist Pager [phone] 04/09/2017 4:37 PM

## 2017-04-09 NOTE — Significant Event (Signed)
Rapid Response Event Note  Overview: Time Called: 2119 Arrival Time: 2125 Event Type: Cardiac  Initial Focused Assessment:  RN called about patient being in AFIB RVR, HR in the 180s and needing Cardizem drip to be initiated.   When I arrived, patient was mild respiratory distress, clammy, diaphoretic, and shocky.  SBP was in the 70s and MAPs in the 50s.  HR was in the 160-170s, irregular, lung sounds clear to diminished, good air movement overall, patient was already on her home CPAP. Skin very cool to touch, patient endorses chest pressure, pressure down her arms and in her neck/jaw.  I immediately got in touch with Cards MD oncall, update him on the status of the patient. SBP was at it lowest in the low 70s and MAPs in the 50s. After interventions, HR converted into SR at 2248, back in AF 2251, then back into SR around 2300 HR in the 80-90s. (EKG was done as well, MD reviewed). BP improved SBP 100-110 range and MAP > 60.   Interventions: -- 750cc NS Bolus given -- NeoSYN IV PUSH total of 180 mcg given, see MAR -- Amio Bolus given then loading dose was started. -- Heparin drip was started as well at 1000 units/hr  -- Troponin STAT x 1 -- EKG done as well -- PCCM was consulted as well, PA and MD did come to the bedside  Plan of Care (if not transferred): -- HR and BP improved, transferred to 2H10  Event Summary: Name of Physician Notified: Dr. Teena Dunk  at 2140  Name of Consulting Physician Notified: Dr. Daron Offer and R. Shearon Stalls PCCM by Cards MD at       Event End Time: [place], Delice Lesch

## 2017-04-09 NOTE — Progress Notes (Signed)
Pt places self on and off of cpap. Removed pt off bipap and gave Pulmicort. Pt has rhonchi and exp wheezes and pt stated she felt tight. Gave PRN xopenex. Pt tolerated well. RT to cont to monitor. Placed pt back on home machine.

## 2017-04-09 NOTE — Progress Notes (Signed)
[place] for heparin Indication: atrial fibrillation  Allergies  Allergen Reactions  . Lisinopril Cough    Patient Measurements: Height: 5\' 3"  (160 cm) Weight: 178 lb 9.2 oz (81 kg) IBW/kg (Calculated) : 52.4   Vital Signs: Temp: 97.5 F (36.4 C) (01/15 1136) Temp Source: Oral (01/15 1136) BP: 115/72 (01/15 1100) Pulse Rate: 67 (01/15 1100)  Labs: Recent Labs    04/08/17 0538 04/08/17 2241 04/09/17 0229 04/09/17 0912  HGB 12.4  --   --   --   HCT 37.5  --   --   --   PLT 150  --   --   --   APTT  --   --   --  64*  HEPARINUNFRC  --   --   --  0.61  CREATININE 0.74  --  0.82  --   TROPONINI  --  0.74*  --   --     Estimated Creatinine Clearance: 63.4 mL/min (by C-G formula based on SCr of 0.82 mg/dL).   Medical History: Past Medical History:  Diagnosis Date  . Allergy   . Arthritis, rheumatoid ([employer])   . Atrial fibrillation ([place])   . Cancer ()    bladder  . COPD (chronic obstructive pulmonary disease) ([place])   . Essential hypertension   . Heart murmur   . Lung nodule   . Myocardial infarction [facility])    ? small with atrial fib   . Osteoporosis   . Pneumonia    hx of   . Seasonal allergies   . Sleep apnea    cpap  setting at 2.5 per patient   . Tobacco abuse     Medications:  Scheduled:  . atorvastatin  80 mg Oral q1800  . budesonide (PULMICORT) nebulizer solution  0.5 mg Nebulization BID  . chlorhexidine  15 mL Mouth Rinse BID  . fluticasone  2 spray Each Nare Daily  . loratadine  10 mg Oral Daily  . mouth rinse  15 mL Mouth Rinse q12n4p  . metoprolol succinate  50 mg Oral QHS  . sodium chloride flush  3 mL Intravenous Q12H    Assessment: 8 yof with history of atrial fibrillation transferred to [facility] from [facility]. Patient was on apixaban at OSH. Confirmed with paper chart and in-patient pharmacist at OSH that last dose of apixaban 5 mg was on 1/13 at 0300. Renal function from OSH showed Scr of 0.9  (CrCl ~65 mL/min).   She is now on heparin with plans for cath today. -heparin level= 0.61 (possiblie influence of recent apixiban), aPTT= 61    Goal of Therapy:  Heparin level 0.3-0.7 Aptt goal 66-102s Monitor platelets by anticoagulation protocol: Yes   Plan:  -Increase heparin to 1100 units/hr -Will follow progress post cath  Hildred Laser, Pharm D 04/09/2017 11:49 AM

## 2017-04-09 NOTE — Progress Notes (Signed)
CRITICAL VALUE ALERT  Critical Value:  Troponin 0.74  Date & Time Notied:  04/09/17 @ 7218  Provider Notified: MD Baltazar Najjar  Will wait for further orders and continue to monitor for shift.

## 2017-04-09 NOTE — Interval H&P Note (Signed)
Cath Lab Visit (complete for each Cath Lab visit)  Clinical Evaluation Leading to the Procedure:   ACS: Yes.    Non-ACS:    Anginal Classification: CCS IV  Anti-ischemic medical therapy: Minimal Therapy (1 class of medications)  Non-Invasive Test Results: No non-invasive testing performed  Prior CABG: No previous CABG      History and Physical Interval Note:  04/09/2017 3:07 PM  Dana Horton  has presented today for surgery, with the diagnosis of unstable angina  The various methods of treatment have been discussed with the patient and family. After consideration of risks, benefits and other options for treatment, the patient has consented to  Procedure(s): LEFT HEART CATH AND CORONARY ANGIOGRAPHY (N/A) as a surgical intervention .  The patient's history has been reviewed, patient examined, no change in status, stable for surgery.  I have reviewed the patient's chart and labs.  Questions were answered to the patient's satisfaction.     Larae Grooms

## 2017-04-10 DIAGNOSIS — I4891 Unspecified atrial fibrillation: Secondary | ICD-10-CM

## 2017-04-10 DIAGNOSIS — J189 Pneumonia, unspecified organism: Secondary | ICD-10-CM

## 2017-04-10 DIAGNOSIS — J181 Lobar pneumonia, unspecified organism: Secondary | ICD-10-CM

## 2017-04-10 DIAGNOSIS — J449 Chronic obstructive pulmonary disease, unspecified: Secondary | ICD-10-CM

## 2017-04-10 DIAGNOSIS — I5033 Acute on chronic diastolic (congestive) heart failure: Secondary | ICD-10-CM

## 2017-04-10 DIAGNOSIS — R748 Abnormal levels of other serum enzymes: Secondary | ICD-10-CM

## 2017-04-10 HISTORY — DX: Unspecified atrial fibrillation: I48.91

## 2017-04-10 HISTORY — DX: Pneumonia, unspecified organism: J18.9

## 2017-04-10 LAB — BASIC METABOLIC PANEL
Anion gap: 11 (ref 5–15)
BUN: 13 mg/dL (ref 6–20)
CO2: 22 mmol/L (ref 22–32)
Calcium: 8.1 mg/dL — ABNORMAL LOW (ref 8.9–10.3)
Chloride: 104 mmol/L (ref 101–111)
Creatinine, Ser: 0.72 mg/dL (ref 0.44–1.00)
GFR calc Af Amer: >60 mL/min (ref >60)
GFR calc non Af Amer: >60 mL/min (ref >60)
Glucose, Bld: 142 mg/dL — ABNORMAL HIGH (ref 65–99)
Potassium: 3.4 mmol/L — ABNORMAL LOW (ref 3.5–5.1)
Sodium: 137 mmol/L (ref 135–145)

## 2017-04-10 LAB — CBC
HCT: 33.7 % — ABNORMAL LOW (ref 36.0–46.0)
Hemoglobin: 10.9 g/dL — ABNORMAL LOW (ref 12.0–15.0)
MCH: 30.4 pg (ref 26.0–34.0)
MCHC: 32.3 g/dL (ref 30.0–36.0)
MCV: 94.1 fL (ref 78.0–100.0)
Platelets: 166 10*3/uL (ref 150–400)
RBC: 3.58 MIL/uL — ABNORMAL LOW (ref 3.87–5.11)
RDW: 16.5 % — ABNORMAL HIGH (ref 11.5–15.5)
WBC: 11.2 10*3/uL — ABNORMAL HIGH (ref 4.0–10.5)

## 2017-04-10 MED ORDER — ALBUTEROL SULFATE (2.5 MG/3ML) 0.083% IN NEBU: 2.5000 mg | INHALATION_SOLUTION | Freq: Four times a day (QID) | RESPIRATORY_TRACT | Status: DC | PRN

## 2017-04-10 MED ORDER — IPRATROPIUM BROMIDE 0.02 % IN SOLN
0.5000 mg | Freq: Four times a day (QID) | RESPIRATORY_TRACT | Status: DC
  Administered 2017-04-10 – 2017-04-11 (×6): 0.5 mg via RESPIRATORY_TRACT
  Filled 2017-04-10 (×6): qty 2.5

## 2017-04-10 MED ORDER — IPRATROPIUM-ALBUTEROL 0.5-2.5 (3) MG/3ML IN SOLN: 3.0000 mL | Freq: Four times a day (QID) | RESPIRATORY_TRACT | Status: DC

## 2017-04-10 MED ORDER — VANCOMYCIN HCL 10 G IV SOLR
1500.0000 mg | Freq: Once | INTRAVENOUS | Status: AC
  Administered 2017-04-10: 1500 mg via INTRAVENOUS
  Filled 2017-04-10: qty 1500

## 2017-04-10 MED ORDER — PIPERACILLIN-TAZOBACTAM 3.375 G IVPB 30 MIN
3.3750 g | Freq: Once | INTRAVENOUS | Status: AC
  Administered 2017-04-10: 3.375 g via INTRAVENOUS
  Filled 2017-04-10: qty 50

## 2017-04-10 MED ORDER — LEVALBUTEROL HCL 0.63 MG/3ML IN NEBU
0.6300 mg | INHALATION_SOLUTION | Freq: Four times a day (QID) | RESPIRATORY_TRACT | Status: DC | PRN
  Administered 2017-04-12: 0.63 mg via RESPIRATORY_TRACT
  Filled 2017-04-10: qty 3

## 2017-04-10 MED ORDER — ALBUTEROL SULFATE (2.5 MG/3ML) 0.083% IN NEBU: 2.5000 mg | INHALATION_SOLUTION | RESPIRATORY_TRACT | Status: DC | PRN

## 2017-04-10 MED ORDER — VANCOMYCIN HCL 500 MG IV SOLR
500.0000 mg | Freq: Two times a day (BID) | INTRAVENOUS | Status: DC
  Administered 2017-04-11: 500 mg via INTRAVENOUS
  Filled 2017-04-10 (×2): qty 500

## 2017-04-10 MED ORDER — FLECAINIDE ACETATE 50 MG PO TABS
100.0000 mg | ORAL_TABLET | Freq: Two times a day (BID) | ORAL | Status: DC
  Administered 2017-04-10 – 2017-04-13 (×7): 100 mg via ORAL
  Filled 2017-04-10 (×7): qty 2

## 2017-04-10 MED ORDER — APIXABAN 5 MG PO TABS
5.0000 mg | ORAL_TABLET | Freq: Two times a day (BID) | ORAL | Status: DC
  Administered 2017-04-10 – 2017-04-13 (×7): 5 mg via ORAL
  Filled 2017-04-10 (×8): qty 1

## 2017-04-10 MED ORDER — PIPERACILLIN-TAZOBACTAM 3.375 G IVPB
3.3750 g | Freq: Three times a day (TID) | INTRAVENOUS | Status: DC
  Administered 2017-04-10 – 2017-04-12 (×5): 3.375 g via INTRAVENOUS
  Filled 2017-04-10 (×8): qty 50

## 2017-04-10 MED ORDER — GUAIFENESIN ER 600 MG PO TB12
600.0000 mg | ORAL_TABLET | Freq: Two times a day (BID) | ORAL | Status: DC | PRN
  Administered 2017-04-12: 600 mg via ORAL
  Filled 2017-04-10: qty 1

## 2017-04-10 MED FILL — Verapamil HCl IV Soln 2.5 MG/ML: INTRAVENOUS | Qty: 2 | Status: AC

## 2017-04-10 NOTE — Progress Notes (Signed)
Progress Note  Patient Name: Dana Horton Date of Encounter: 04/10/2017  Primary Cardiologist: [place] into Afib RVR overnight, became hypotensive and dyspneic -apparently this was shortly after using albuterol. Transferred to ICU, placed on amiodarone drip --> now back in SR. Breathing improved.  Still feels "congested "but better after nebulizer. Diuresed well - but not accurately recorded  Inpatient Medications    Scheduled Meds: . apixaban  5 mg Oral BID  . atorvastatin  80 mg Oral q1800  . budesonide (PULMICORT) nebulizer solution  0.5 mg Nebulization BID  . chlorhexidine  15 mL Mouth Rinse BID  . flecainide  100 mg Oral Q12H  . fluticasone  2 spray Each Nare Daily  . loratadine  10 mg Oral Daily  . mouth rinse  15 mL Mouth Rinse q12n4p  . metoprolol succinate  50 mg Oral QHS  . sodium chloride flush  3 mL Intravenous Q12H   Continuous Infusions: . sodium chloride    . sodium chloride     PRN Meds: sodium chloride, acetaminophen, levalbuterol, ondansetron (ZOFRAN) IV, sodium chloride flush   Vital Signs    Vitals:   04/10/17 0835 04/10/17 0900 04/10/17 1000 04/10/17 1202  BP:  (!) 112/57 (!) 122/53 119/61  Pulse:  75 82 83  Resp:  (!) 28 (!) 30 (!) 30  Temp: 98 F (36.7 C)   99.7 F (37.6 C)  TempSrc: Oral   Axillary  SpO2:  100% 100% 93%  Weight:      Height:        Intake/Output Summary (Last 24 hours) at 04/10/2017 1236 Last data filed at 04/10/2017 0900 Gross per 24 hour  Intake 476.6 ml  Output 1325 ml  Net -848.4 ml   Filed Weights   04/08/17 0459 04/09/17 0437 04/10/17 0610  Weight: 166 lb 4.8 oz (75.4 kg) 178 lb 9.2 oz (81 kg) 175 lb 7.8 oz (79.6 kg)    Telemetry    Back in SR - Personally Reviewed  ECG    04/08/17 2114 Afib RVR - Personally Reviewed  Physical Exam   General: Older W female appearing in no acute distress., wearing [place]. Head: Normocephalic, atraumatic.  Neck: Supple, no  JVD. Lungs:  Resp regular and unlabored, wheezing bilaterally Heart: RRR, S1, S2, no S3, S4, systolic murmur; no rub. Abdomen: Soft, non-tender, non-distended with normoactive bowel sounds. Extremities: No clubbing, cyanosis, edema. Distal pedal pulses are 2+ bilaterally. Neuro: Alert and oriented X 3. Moves all extremities spontaneously. Psych: Normal affect.  Labs    Chemistry Recent Labs  Lab 04/08/17 0538 04/09/17 0229 04/10/17 0900  NA 139 139 137  K 3.6 3.6 3.4*  CL 104 104 104  CO2 26 26 22   GLUCOSE 89 121* 142*  BUN 23* 21* 13  CREATININE 0.74 0.82 0.72  CALCIUM 8.5* 8.2* 8.1*  GFRNONAA >60 >60 >60  GFRAA >60 >60 >60  ANIONGAP 9 9 11      Hematology Recent Labs  Lab 04/08/17 0538 04/10/17 0900  WBC 12.4* 11.2*  RBC 3.98 3.58*  HGB 12.4 10.9*  HCT 37.5 33.7*  MCV 94.2 94.1  MCH 31.2 30.4  MCHC 33.1 32.3  RDW 16.8* 16.5*  PLT 150 166    Cardiac Enzymes Recent Labs  Lab 04/08/17 2241  TROPONINI 0.74*   No results for input(s): TROPIPOC in the last 168 hours.   BNPNo results for input(s): BNP, PROBNP in the last 168 hours.   DDimer No results for  input(s): DDIMER in the last 168 hours.    Radiology    Dg Chest Port 1 View  Result Date: 04/09/2017 CLINICAL DATA:  Shortness of breath today. EXAM: PORTABLE CHEST 1 VIEW COMPARISON:  Single-view of the chest 04/07/2017. Single-view of the chest and CT chest 03/27/2017. FINDINGS: The lungs are emphysematous. Bibasilar airspace disease is worse on the left and appears unchanged since most recent exam. No pneumothorax. Small left effusion noted. Heart size is normal. IMPRESSION: No change in left basilar airspace disease most compatible with pneumonia. Mild right basilar airspace disease also persist. Emphysema. Electronically Signed   By: Inge Rise M.D.   On: 04/09/2017 10:44    Cardiac Studies   N/a   Patient Profile     72 y.o. female  with PMH of PAF, HTN, COPD, Bladder Ca and OSA on Cpap  who presented with chest pain and Afib RVR. Found to have positive troponins with plan for cath.  Assessment & Plan  Principal Problem:   Atrial fibrillation with rapid ventricular response (HCC) Active Problems:   PAF (paroxysmal atrial fibrillation) (HCC)   COPD mixed type (HCC)   Acute on chronic diastolic congestive heart failure (HCC)   Essential hypertension   OSA on CPAP   Demand ischemia (HCC)   Non-ST elevation (NSTEMI) myocardial infarction (HCC)   Elevated troponin   1. Afib RVR with chronic paroxysmal A. fib: Went back into Afib RVR last night with hypotension and dyspnea. Moved to the Unit, placed on IV amiodarone, and Dilt. Back in SR this morning. Remains on IV amiodarone. Eliquis on hold given plans for cardiac cath.  CHADS2VASC of 3  Seen by EP yesterday post cath -- > plan is to restart flecainide at 100 mg daily.  Amiodarone discontinued  Eliquis restarted post cath -may consider watchman device  Continue beta-blocker  Plan is for her to be followed up in EP clinic after discharge  2. NSTEMI -Trop peaked at 7 while at [facility] Interestingly, this is probably more of a Type 2  demand ischemia MI in this fact that she had minimal CAD on cath yesterday despite having troponin of 7  No chest pain  Thankfully, this will allow [place] to go back to using flecainide for A. Fib  And stopping heparin  For now given the evidence of myocardial injury, will continue with high-dose statin, but can likely reduce upon outpatient follow-up.  3. HTN: stable with current therapy  4. Dyspnea: CXR prior to admission showed small left side pleural effusion. Given IV lasix yesterday with improvement. Had 1L UOP. Will hold on further diuresis today. --Chest x-ray checked yesterday suggest possible pneumonia --> hospitalist consult to assist with pneumonia management.  5. OSA on Cpap: compliant  6. HL: on statin  7. COPD: Given albuterol tx prior to episode of Afib RVR. Seen by  PCCM. Switched to [employer]. Along with Pulmicort  Continues to have somewhat productive cough today with coarse rhonchi throughout and diminished breath sounds in the left base.  Concern for possible pneumonia on chest x-ray and exam.  I have consulted [facility] medicine service for assistance with pneumonia management as possible H CAP versus CAP   Signed, Glenetta Hew, MD  04/10/2017, 12:36 PM  Pager # [phone]   For questions or updates, please contact [facility] Please consult [email or web address] for contact info under Cardiology/STEMI.

## 2017-04-10 NOTE — Progress Notes (Signed)
Electrophysiology Rounding Note  Patient Name: Dana Horton Date of Encounter: 04/10/2017  Primary Cardiologist: [place]   The patient is improved today.  No chest pain or shortness of breath at rest.   Inpatient Medications    Scheduled Meds: . atorvastatin  80 mg Oral q1800  . budesonide (PULMICORT) nebulizer solution  0.5 mg Nebulization BID  . chlorhexidine  15 mL Mouth Rinse BID  . fluticasone  2 spray Each Nare Daily  . loratadine  10 mg Oral Daily  . mouth rinse  15 mL Mouth Rinse q12n4p  . metoprolol succinate  50 mg Oral QHS  . sodium chloride flush  3 mL Intravenous Q12H   Continuous Infusions: . sodium chloride    . amiodarone 30 mg/hr (04/09/17 1700)  . heparin 1,100 Units/hr (04/10/17 0900)  . sodium chloride     PRN Meds: sodium chloride, acetaminophen, levalbuterol, ondansetron (ZOFRAN) IV, sodium chloride flush   Vital Signs    Vitals:   04/10/17 0700 04/10/17 0730 04/10/17 0835 04/10/17 0900  BP: (!) 119/55   (!) 112/57  Pulse: 75 72  75  Resp: (!) 25 (!) 33  (!) 28  Temp:   98 F (36.7 C)   TempSrc:   Oral   SpO2: 92% 91%  100%  Weight:      Height:        Intake/Output Summary (Last 24 hours) at 04/10/2017 0955 Last data filed at 04/10/2017 0900 Gross per 24 hour  Intake 587.7 ml  Output 1325 ml  Net -737.3 ml   Filed Weights   04/08/17 0459 04/09/17 0437 04/10/17 0610  Weight: 166 lb 4.8 oz (75.4 kg) 178 lb 9.2 oz (81 kg) 175 lb 7.8 oz (79.6 kg)    Physical Exam    GEN- The patient is elderly appearing, alert and oriented x 3 today.   Head- normocephalic, atraumatic Eyes-  Sclera clear, conjunctiva pink Ears- hearing intact Oropharynx- clear Neck- supple Lungs- normal work of breathing, +wheezing Heart- Regular rate and rhythm  GI- soft, NT, ND, + BS Extremities- no clubbing, cyanosis, or edema Skin- no rash or lesion Psych- euthymic mood, full affect Neuro- strength and sensation are  intact  Labs    CBC Recent Labs    04/08/17 0538  WBC 12.4*  HGB 12.4  HCT 37.5  MCV 94.2  PLT 400   Basic Metabolic Panel Recent Labs    04/08/17 0538 04/09/17 0229  NA 139 139  K 3.6 3.6  CL 104 104  CO2 26 26  GLUCOSE 89 121*  BUN 23* 21*  CREATININE 0.74 0.82  CALCIUM 8.5* 8.2*   Cardiac Enzymes Recent Labs    04/08/17 2241  TROPONINI 0.74*     Telemetry    SR (personally reviewed)  Radiology    Dg Chest Port 1 View  Result Date: 04/09/2017 CLINICAL DATA:  Shortness of breath today. EXAM: PORTABLE CHEST 1 VIEW COMPARISON:  Single-view of the chest 04/07/2017. Single-view of the chest and CT chest 03/27/2017. FINDINGS: The lungs are emphysematous. Bibasilar airspace disease is worse on the left and appears unchanged since most recent exam. No pneumothorax. Small left effusion noted. Heart size is normal. IMPRESSION: No change in left basilar airspace disease most compatible with pneumonia. Mild right basilar airspace disease also persist. Emphysema. Electronically Signed   By: Inge Rise M.D.   On: 04/09/2017 10:44     Patient Profile     Dana Horton is a  72 y.o. female admitted with symptomatic persistent AF with RVR.  Assessment & Plan    1.  Persistent AF with RVR She was on Flecainide 50mg  twice daily at home with increasing episodes of AF Amiodarone not a good long term option with underlying lung disease. Multaq is cost prohibitive Will increase Flecainide to 100mg  twice daily and follow Tikosyn would be an option if she fails increased dose of Flecainide. Would need to be sure she could afford prior to initiation Stop amiodarone Continue Eliquis long term for CHADS2VASC of 3 Echo 08/2016 with LA of 4cm - could consider referral for PVI down the road.   2. NSTEMI 2/2 AF with RVR Cath with no CAD yesterday  3.  HTN Stable No change required today  4.  OSA on CPAP Compliance encouraged  Dr Lovena Le to see later  today Could probably transfer to [place]  Early outpatient follow up with AF clinic arranged  Signed, Chanetta Marshall, NP  04/10/2017, 9:55 AM   EP Attending  Patient seen and examined. Agree with the findings as noted above by Chanetta Marshall, NP. The patient has a h/o PAF and presented with a NSTEMI but on left heart cath found to have no obstructive CAD. Her EF is normal. Agree with above. Will continue flecainide 100 mg bid for now and then follow. If she remains in NSR, continue flecainide. If she has recurrent atrial fib despite the flecainide, would suggest dofetilide and consider referral to EP for possible ablation.   Mikle Bosworth.D.

## 2017-04-10 NOTE — H&P (Signed)
Medical Consultation   Dana Horton  FOY:[id number]  DOB: Jan 02, 1946  DOA: 04/07/2017  PCP: Townsend Roger, MD    Requesting physician: Dr. Ellyn Hack, Cardiology Reason for consultation: Pneumonia    History of Present Illness:Dana Horton is an 72 y.o. female with a history of atrial fibrillation, rheumatoid arthritis, COPD, hypertension, OSA on CPAP, who was brought from [facility], for the management of NSTEMI  and atrial fibrillation with RVR, started on 02/06/2018, after the initiation of albuterol treatment as outpatient, as she was being treated with pneumonia.  On presentation, her status was complicated with hypotension, requiring drip.  She was on respiratory distress, with appreciable wheezing, and she was tachycardic. Albuterol was discontinued, and she was started on Xopenex.  No antibiotics were initiated at the time due to need for treatment of her hypotension needing pressors at the ICU  as well as, her atrial fibrillation requiring cardioversion with amiodarone, going back to sinus rhythm. SHe underwent cardiac cath yesterday 1/15, bu tbecause she continued to complain of congestion, for which a chest x-ray was performed, suggesting left basilar airspace disease consistent with pneumonia, also persistent mild right basilar airspace, as well as emphysema.  Patient is a recent tobacco abuser, having quit on January 1.  She reports some head congestion as well, draining some light green mucus material.  Denies any headaches.  He denies any nausea or vomiting.  She denies any abdominal pain, or diarrhea.  She is not sure of who may have been her sick contacts.  She denies any long distance trips, history of PE or DVT.  He has a history of seasonal allergies.  White count is 11.2 no lactic acid was drawn.  Bicarb is 22.   Review of Systems:  As per HPI otherwise all other systems reviewed and are negative    Past Medical  History: Past Medical History:  Diagnosis Date  . Allergy   . Arthritis, rheumatoid ([place])   . Atrial fibrillation ([place])   . Cancer ([place])    bladder  . COPD (chronic obstructive pulmonary disease) ([place])   . Essential hypertension   . Heart murmur   . Lung nodule   . Myocardial infarction [facility])    ? small with atrial fib   . Osteoporosis   . Pneumonia    hx of   . Seasonal allergies   . Sleep apnea    cpap  setting at 2.5 per patient   . Tobacco abuse     Past Surgical History: Past Surgical History:  Procedure Laterality Date  . ABDOMINAL HYSTERECTOMY    . BLADDER SURGERY    . COLONOSCOPY N/A 12/28/2016   Procedure: COLONOSCOPY;  Surgeon: Doran Stabler, MD;  Location: [facility];  Service: Gastroenterology;  Laterality: N/A;  . LEFT HEART CATH AND CORONARY ANGIOGRAPHY N/A 04/09/2017   Procedure: LEFT HEART CATH AND CORONARY ANGIOGRAPHY;  Surgeon: Jettie Booze, MD;  Location: [facility];  Service: Cardiovascular;  Laterality: N/A;  . MOUTH SURGERY    . POLYPECTOMY N/A 12/28/2016   Procedure: POLYPECTOMY with injection of [place];  Surgeon: Doran Stabler, MD;  Location: [facility];  Service: Gastroenterology;  Laterality: N/A;     Allergies:   Allergies  Allergen Reactions  . Lisinopril Cough     Social History: Social History   Socioeconomic History  . Marital status: Married    Spouse  name: Not on file  . Number of children: Not on file  . Years of education: Not on file  . Highest education level: Not on file  Social Needs  . Financial resource strain: Not on file  . Food insecurity - worry: Not on file  . Food insecurity - inability: Not on file  . Transportation needs - medical: Not on file  . Transportation needs - non-medical: Not on file  Occupational History  . Not on file  Tobacco Use  . Smoking status: Current Every Day Smoker    Packs/day: 0.50    Years: 52.00    Pack years: 26.00  . Smokeless tobacco: Never Used   . Tobacco comment: 1/2 pack a day  Substance and Sexual Activity  . Alcohol use: No  . Drug use: No  . Sexual activity: Not on file  Other Topics Concern  . Not on file  Social History Narrative  . Not on file       Family History: Family History  Problem Relation Age of Onset  . Hypertension Mother   . Stroke Mother   . Cancer Father   . Heart disease Father   . CAD Father   . Arrhythmia Father   . Bladder Cancer Brother   . Lung cancer Brother     Family history reviewed and not pertinent    Physical Exam: Vitals:   04/10/17 0835 04/10/17 0900 04/10/17 1000 04/10/17 1202  BP:  (!) 112/57 (!) 122/53 119/61  Pulse:  75 82 83  Resp:  (!) 28 (!) 30 (!) 30  Temp: 98 F (36.7 C)   99.7 F (37.6 C)  TempSrc: Oral   Axillary  SpO2:  100% 100% 93%  Weight:      Height:        Constitutional: Appears calm,  alert and awake, oriented x3, NAD, wearing her CPAP Eyes: PERLA, EOMI, irises appear normal, anicteric sclera,  ENMT: external ears and nose appear normal, normal hearing   Lips   oropharynx mucosa, tongue  appear normal  Neck: neck appears normal, no masses, normal ROM, no thyromegaly, mild JVD  CVS: S1-S2 clear, no murmur rubs or gallops, trace lower extremity LE edema, normal pedal pulses  Respiratory: minimal wheezing, bibasilar rales, decreased breath sounds at the bases L>R Respiratory effort normal. No accessory muscle use.  Abdomen: soft nontender, obese nondistended, normal bowel sounds, no hepatosplenomegaly, no hernias  Musculoskeletal: no cyanosis, clubbing, trace RLE edema noted bilaterally.   Joint/bones/muscle exam, strength, contractures or atrophy Neuro: Cranial nerves II-XII intact, strength, sensation, reflexes Psych: judgement and insight appear normal, stable mood and affect, mental status Skin: no rashes or lesions or ulcers, no induration or nodules. Mils erythema at the L antecubital area, suggestive of superficial thrombophlebitis   Data  reviewed:  I have personally reviewed following labs and imaging studies Labs:  CBC: Recent Labs  Lab 04/08/17 0538 04/10/17 0900  WBC 12.4* 11.2*  HGB 12.4 10.9*  HCT 37.5 33.7*  MCV 94.2 94.1  PLT 150 502    Basic Metabolic Panel: Recent Labs  Lab 04/08/17 0538 04/09/17 0229 04/10/17 0900  NA 139 139 137  K 3.6 3.6 3.4*  CL 104 104 104  CO2 26 26 22   GLUCOSE 89 121* 142*  BUN 23* 21* 13  CREATININE 0.74 0.82 0.72  CALCIUM 8.5* 8.2* 8.1*   GFR Estimated Creatinine Clearance: 64.5 mL/min (by C-G formula based on SCr of 0.72 mg/dL). Liver Function Tests: No results  for input(s): AST, ALT, ALKPHOS, BILITOT, PROT, ALBUMIN in the last 168 hours. No results for input(s): LIPASE, AMYLASE in the last 168 hours. No results for input(s): AMMONIA in the last 168 hours. Coagulation profile No results for input(s): INR, PROTIME in the last 168 hours.  Cardiac Enzymes: Recent Labs  Lab 04/08/17 2241  TROPONINI 0.74*   BNP: Invalid input(s): POCBNP CBG: No results for input(s): GLUCAP in the last 168 hours. D-Dimer No results for input(s): DDIMER in the last 72 hours. Hgb A1c No results for input(s): HGBA1C in the last 72 hours. Lipid Profile No results for input(s): CHOL, HDL, LDLCALC, TRIG, CHOLHDL, LDLDIRECT in the last 72 hours. Thyroid function studies No results for input(s): TSH, T4TOTAL, T3FREE, THYROIDAB in the last 72 hours.  Invalid input(s): FREET3 Anemia work up No results for input(s): VITAMINB12, FOLATE, FERRITIN, TIBC, IRON, RETICCTPCT in the last 72 hours. Urinalysis No results found for: COLORURINE, APPEARANCEUR, [place], [place], [place], [place], [place], [place], PROTEINUR, UROBILINOGEN, NITRITE, LEUKOCYTESUR   Sepsis Labs Invalid input(s): PROCALCITONIN,  WBC,  LACTICIDVEN Microbiology Recent Results (from the past 240 hour(s))  MRSA PCR Screening     Status: None   Collection Time: 04/09/17  1:00 AM  Result Value Ref Range Status    MRSA by PCR NEGATIVE NEGATIVE Final    Comment:        The GeneXpert MRSA Assay (FDA approved for NASAL specimens only), is one component of a comprehensive MRSA colonization surveillance program. It is not intended to diagnose MRSA infection nor to guide or monitor treatment for MRSA infections.        Inpatient Medications:   Scheduled Meds: . apixaban  5 mg Oral BID  . atorvastatin  80 mg Oral q1800  . budesonide (PULMICORT) nebulizer solution  0.5 mg Nebulization BID  . chlorhexidine  15 mL Mouth Rinse BID  . flecainide  100 mg Oral Q12H  . fluticasone  2 spray Each Nare Daily  . loratadine  10 mg Oral Daily  . mouth rinse  15 mL Mouth Rinse q12n4p  . metoprolol succinate  50 mg Oral QHS  . sodium chloride flush  3 mL Intravenous Q12H   Continuous Infusions: . sodium chloride    . sodium chloride       Radiological Exams on Admission: Dg Chest Port 1 View  Result Date: 04/09/2017 CLINICAL DATA:  Shortness of breath today. EXAM: PORTABLE CHEST 1 VIEW COMPARISON:  Single-view of the chest 04/07/2017. Single-view of the chest and CT chest 03/27/2017. FINDINGS: The lungs are emphysematous. Bibasilar airspace disease is worse on the left and appears unchanged since most recent exam. No pneumothorax. Small left effusion noted. Heart size is normal. IMPRESSION: No change in left basilar airspace disease most compatible with pneumonia. Mild right basilar airspace disease also persist. Emphysema. Electronically Signed   By: Inge Rise M.D.   On: 04/09/2017 10:44    Impression/Recommendations Principal Problem:   PAF (paroxysmal atrial fibrillation) (HCC) Active Problems:   Demand ischemia (HCC)   Non-ST elevation (NSTEMI) myocardial infarction [facility])   Essential hypertension   COPD mixed type (HCC)   OSA on CPAP   Acute congestive heart failure (HCC)   Elevated troponin  Acute Respiratory Failure with Hypoxia likely due to HCAP in a patient with a history of  COPD.  CURB 65 score 2 Moderate risk group: 6.8% 30-day mortality. Presenting now with ongoing / progressive symptoms since November of 2018 treated with severral courses of antibiotics after 3 presentations  to the hospital, now with light green to yellow productive cough, and shortness of breath and wheezing.  She is afebrile.  Chest x-ray suggest left basilar airspace disease consistent with pneumonia, also persistent mild right basilar airspace, as well as emphysema.  Patient is a recent tobacco abuser, having quit on January 1.  She also has a history of OSA on CPAP nightly, and during the day as needed.  White count is 11.2 no lactic acid was drawn.  Bicarb is 22.  IV antibiotics with per protocol, with Zosyn and vancomycin Procalcitonin,Strep pneumo urine antigen, Legionella urine antigen, lactic acid Nebulizers  with Atrovent q 6 and  Xopenex q 6 prn Blood cultures pending, sputum cultures prior to administration of antibiotics. Mucinex prn  CBC in a.m. Tobacco cessation has been readdressed, and consult, and patient wishes to discontinue tobacco completely.  She declines the need for nicotine gum or patch. Patient may benefit from seeing pulmonologist as outpatient, as he may need to be placed on oxygen once discharged from the hospital, history of ASA   Atrial fibrillation with RVR, with hypotension and dyspnea, the patient was placed on IV amiodarone, diltiazem, now is back to sinus rhythm.  The patient remains on IV amiodarone.  Due to plans for cardiac catheterization regarding her history ofN STEMI, no oral anticoagulation is in place at this time.  Will defer any further plans as per cardiology.  N STEMI, initial troponin was 7 at [facility], despite not having chest pain. She was to undergo cardiac catheterization on 1/14, but because of dyspnea as mentioned above, and wheezing, this was postponed until 3/29, without complications.  Results are unable to be reviewed at this time.   Further plans as per cardiac team   Other medical issues, including hypotension requiring pressors, and hyperlipidemia, as per cardiology.  Thank you for this consultation.  Our [facility] hospitalist team will follow the patient with you.   Time Spent:   Sharene Butters PA-C [facility] 04/10/2017, 12:36 PM

## 2017-04-10 NOTE — Care Management Note (Signed)
Case Management Note  Patient Details  Name: Dana Horton MRN: [id number] Date of Birth: 25-Oct-1945  Subjective/Objective:   From home with spouse, pta indep, presents with afib with rvr, NSTEMI, HTN, Dyspnea, OSA, has copd. She has PCP and medication coverage. She did not use any DME pta.                    Action/Plan: NCM will follow progression.   Expected Discharge Date:  04/09/17               Expected Discharge Plan:  Home/Self Care  In-House Referral:     Discharge planning Services  CM Consult  Post Acute Care Choice:    Choice offered to:     DME Arranged:    DME Agency:     HH Arranged:    HH Agency:     Status of Service:  In process, will continue to follow  If discussed at Long Length of Stay Meetings, dates discussed:    Additional Comments:  Zenon Mayo, RN 04/10/2017, 11:40 AM

## 2017-04-10 NOTE — Progress Notes (Signed)
Pharmacy Antibiotic Note  Dana Horton is a 72 y.o. female .  Pharmacy has been consulted for vancomycin and zosyn dosing. -WBC= 11.2, afebrile, CXR 1/15 with possible PNA -SCr= 0.73, CrCl ~ 60  Plan: -Vancomycin 1500mg  IV followed by 500mg  IV q12h -Zosyn 3.375gm IV q8h -Will follow renal function, cultures and clinical progress    Height: 5\' 3"  (160 cm) Weight: 175 lb 7.8 oz (79.6 kg) IBW/kg (Calculated) : 52.4  Temp (24hrs), Avg:98.6 F (37 C), Min:98 F (36.7 C), Max:99.7 F (37.6 C)  Recent Labs  Lab 04/08/17 0538 04/09/17 0229 04/10/17 0900  WBC 12.4*  --  11.2*  CREATININE 0.74 0.82 0.72    Estimated Creatinine Clearance: 64.5 mL/min (by C-G formula based on SCr of 0.72 mg/dL).    Allergies  Allergen Reactions  . Lisinopril Cough    Antimicrobials this admission: 1/16 vanc 1/16 zosyn  Dose adjustments this admission:   Microbiology results: 1/16 blood x2 1/16 resp 1/16 MRSA PCR  Thank you for allowing pharmacy to be a part of this patient's care.  Hildred Laser, Pharm D 04/10/2017 1:20 PM

## 2017-04-10 NOTE — Progress Notes (Signed)
Sputum specimen container given to patient and explained how to collect sputum to patient. Patient had just coughed and suctioned sputum before arrival and pt will attempt to collect more when able.

## 2017-04-11 LAB — CBC
HCT: 32.5 % — ABNORMAL LOW (ref 36.0–46.0)
Hemoglobin: 10.7 g/dL — ABNORMAL LOW (ref 12.0–15.0)
MCH: 31.1 pg (ref 26.0–34.0)
MCHC: 32.9 g/dL (ref 30.0–36.0)
MCV: 94.5 fL (ref 78.0–100.0)
Platelets: 163 10*3/uL (ref 150–400)
RBC: 3.44 MIL/uL — ABNORMAL LOW (ref 3.87–5.11)
RDW: 16.5 % — ABNORMAL HIGH (ref 11.5–15.5)
WBC: 9.4 10*3/uL (ref 4.0–10.5)

## 2017-04-11 LAB — BASIC METABOLIC PANEL
Anion gap: 10 (ref 5–15)
BUN: 13 mg/dL (ref 6–20)
CO2: 23 mmol/L (ref 22–32)
Calcium: 8.3 mg/dL — ABNORMAL LOW (ref 8.9–10.3)
Chloride: 108 mmol/L (ref 101–111)
Creatinine, Ser: 0.79 mg/dL (ref 0.44–1.00)
GFR calc Af Amer: >60 mL/min (ref >60)
GFR calc non Af Amer: >60 mL/min (ref >60)
Glucose, Bld: 113 mg/dL — ABNORMAL HIGH (ref 65–99)
Potassium: 3.3 mmol/L — ABNORMAL LOW (ref 3.5–5.1)
Sodium: 141 mmol/L (ref 135–145)

## 2017-04-11 LAB — EXPECTORATED SPUTUM ASSESSMENT W GRAM STAIN, RFLX TO RESP C

## 2017-04-11 MED ORDER — POTASSIUM CHLORIDE CRYS ER 20 MEQ PO TBCR
40.0000 meq | EXTENDED_RELEASE_TABLET | Freq: Once | ORAL | Status: AC
  Administered 2017-04-11: 40 meq via ORAL
  Filled 2017-04-11: qty 2

## 2017-04-11 NOTE — Progress Notes (Signed)
Progress Note  Patient Name: Dana Horton Date of Encounter: 04/11/2017  Primary Cardiologist: Oval Linsey  Subjective   Has been mainly in the hallway without problems today. Was seen by internal medicine yesterday. Antibiotics started for pneumonia.  Inpatient Medications    Scheduled Meds: . apixaban  5 mg Oral BID  . atorvastatin  80 mg Oral q1800  . chlorhexidine  15 mL Mouth Rinse BID  . flecainide  100 mg Oral Q12H  . fluticasone  2 spray Each Nare Daily  . ipratropium  0.5 mg Nebulization Q6H  . loratadine  10 mg Oral Daily  . mouth rinse  15 mL Mouth Rinse q12n4p  . metoprolol succinate  50 mg Oral QHS  . sodium chloride flush  3 mL Intravenous Q12H   Continuous Infusions: . sodium chloride    . piperacillin-tazobactam (ZOSYN)  IV 3.375 g (04/11/17 1204)  . sodium chloride     PRN Meds: sodium chloride, acetaminophen, guaiFENesin, levalbuterol, ondansetron (ZOFRAN) IV, sodium chloride flush   Vital Signs    Vitals:   04/11/17 0915 04/11/17 0916 04/11/17 1000 04/11/17 1228  BP: (!) 111/56  116/61   Pulse: 77  74   Resp: (!) 23  (!) 25   Temp:    98.5 F (36.9 C)  TempSrc:    Oral  SpO2: 97% 96% 97%   Weight:      Height:        Intake/Output Summary (Last 24 hours) at 04/11/2017 1229 Last data filed at 04/11/2017 0800 Gross per 24 hour  Intake 1210 ml  Output 1100 ml  Net 110 ml   Filed Weights   04/09/17 0437 04/10/17 0610 04/11/17 0500  Weight: 178 lb 9.2 oz (81 kg) 175 lb 7.8 oz (79.6 kg) 167 lb 8 oz (76 kg)    Telemetry    Maintaining sinus rhythm. - Personally Reviewed  ECG    No new EKG- Personally Reviewed  Physical Exam   General appearance: alert, cooperative, no distress and Pleasant elderly woman Neck: no carotid bruit and no JVD Lungs: dullness to percussion LUL and Notably improved breath sounds with mild x-ray wheeze and diminished sounds in the left lung base. Heart: regular rate and rhythm, S1, S2 normal,  no murmur, click, rub or gallop and normal apical impulse Abdomen: soft, non-tender; bowel sounds normal; no masses,  no organomegaly Extremities: extremities normal, atraumatic, no cyanosis or edema Pulses: 2+ and symmetric Neurologic: Grossly normal   Labs    Chemistry Recent Labs  Lab 04/09/17 0229 04/10/17 0900 04/11/17 0323  NA 139 137 141  K 3.6 3.4* 3.3*  CL 104 104 108  CO2 26 22 23   GLUCOSE 121* 142* 113*  BUN 21* 13 13  CREATININE 0.82 0.72 0.79  CALCIUM 8.2* 8.1* 8.3*  GFRNONAA >60 >60 >60  GFRAA >60 >60 >60  ANIONGAP 9 11 10      Hematology Recent Labs  Lab 04/08/17 0538 04/10/17 0900 04/11/17 0323  WBC 12.4* 11.2* 9.4  RBC 3.98 3.58* 3.44*  HGB 12.4 10.9* 10.7*  HCT 37.5 33.7* 32.5*  MCV 94.2 94.1 94.5  MCH 31.2 30.4 31.1  MCHC 33.1 32.3 32.9  RDW 16.8* 16.5* 16.5*  PLT 150 166 163    Cardiac Enzymes Recent Labs  Lab 04/08/17 2241  TROPONINI 0.74*   No results for input(s): TROPIPOC in the last 168 hours.   BNPNo results for input(s): BNP, PROBNP in the last 168 hours.   DDimer No results for  input(s): DDIMER in the last 168 hours.    Radiology    No results found.  Cardiac Studies   N/a   Patient Profile     72 y.o. female  with PMH of PAF, HTN, COPD, Bladder Ca and OSA on Cpap who presented with chest pain and Afib RVR. Found to have positive troponins with plan for cath.  Assessment & Plan  Principal Problem:   Atrial fibrillation with rapid ventricular response (HCC) Active Problems:   PAF (paroxysmal atrial fibrillation) (HCC)   COPD mixed type (HCC)   Acute on chronic diastolic congestive heart failure (HCC)   HCAP (healthcare-associated pneumonia)   Essential hypertension   OSA on CPAP   Demand ischemia (HCC)   Non-ST elevation (NSTEMI) myocardial infarction (HCC)   Elevated troponin   1. Afib RVR with chronic paroxysmal A. fib: Went back into Afib RVR last night with hypotension and dyspnea. Moved to the Unit,  placed on IV amiodarone, and Dilt. Back in SR this morning. Remains on IV amiodarone. Eliquis on hold given plans for cardiac cath.  CHADS2VASC of 3  Seen by EP post cath -- > plan is to restart flecainide at 100 mg daily.  Amiodarone discontinued  Eliquis restarted post cath -may consider watchman device  Continue beta-blocker  Plan is for her to be followed up in EP clinic after discharge  2. NSTEMI -Trop peaked at 7 [facility]: Interestingly, this is probably more of a Type 2  demand ischemia MI in this fact that she had minimal CAD on cath yesterday despite having troponin of 7  No chest pain  For now given the evidence of myocardial injury, will continue with high-dose statin, but can likely reduce upon outpatient follow-up.  3. HTN: stable with current therapy  4. Dyspnea: CXR prior to admission showed small left side pleural effusion. Given IV lasix yesterday with improvement. Had 1L UOP. Will hold on further diuresis today. --Chest x-ray checked yesterday suggest possible pneumonia --> hospitalist consult to assist with pneumonia management.  5. OSA on Cpap: compliant  6. HL: on statin  7. COPD: Given albuterol tx prior to episode of Afib RVR. Seen by PCCM. Switched to [employer]. Along with Pulmicort  Continues to have somewhat productive cough today with coarse rhonchi throughout and diminished breath sounds in the left base.  Concern for possible pneumonia on chest x-ray and exam.  I have consulted [place] medicine service for assistance with pneumonia management as possible H CAP versus CAP  Antibiotics initiated by hospitalist service, have scaled down from vancomycin/Zosyn to simply Zosyn. -Cough seems to be clearing  Appreciate hospitalist service assistance.  At this point the main thing keeping her in the hospital is actually treatment of pneumonia.  Hypokalemic today. Replace potassium.  Transferred to telemetry today. Discharge weight pending treatment of  pneumonia.   Signed, Glenetta Hew, MD  04/11/2017, 12:29 PM  Pager # [phone]   For questions or updates, please contact [facility] Please consult [email or web address] for contact info under Cardiology/STEMI.

## 2017-04-11 NOTE — Progress Notes (Addendum)
    Eliquis and Xarelto: are both tier 3, no auth required, $47 at retail pharmacy         1. TIKOSYN  125 MCG , 250 MCG 500 MCG BID  COVER-  NOT [place] # [phone]   2. DOFETILIDE 125 MCG BID  COVER- YES  CO-PAY- $ 143.88  TIER- 4 DRUG  PRIOR APPROVAL- NO   2. DOFETILIDE 250 MCG BID  COVER- YES  CO-PAY- $ 144.04  TIER- 4 DRUG  PRIOR APPROVAL- NO   3. DOFETILIDE 500 MCG BID  COVER- YES  CO-PAY- $ 144.30  TIER- 4 DRUG  PRIOR APPROVAL- NO

## 2017-04-11 NOTE — Evaluation (Signed)
Physical Therapy Evaluation Patient Details Name: Dana Horton MRN: [id number] DOB: 1946/01/26 Today's Date: 04/11/2017   History of Present Illness  Pt is a 72 y.o. female admitted from [facility] on 04/07/17 due to NSTEMI and a-fib with RVR. Cardiac cath showed no apparent ischemia and preserved EF. CXR indicated LLL PNA, consistent with HCAP. PMH includes a-fib, RA, COPD, HTN, OSA on CPAP, tobacco abuse.    Clinical Impression  Patient evaluated by Physical Therapy with no further acute PT needs identified. Pt currently indep with all mobility. Educ on importance of continued activity, activity pacing, and pursed lip breathing. All education has been completed and the patient has no further questions. Encouraged to continue ambulating often during hospital admission (RN notified). PT is signing off. Thank you for this referral.    Follow Up Recommendations No PT follow up    Equipment Recommendations  None recommended by PT    Recommendations for Other Services       Precautions / Restrictions Precautions Precautions: None Restrictions Weight Bearing Restrictions: No      Mobility  Bed Mobility Overal bed mobility: Independent                Transfers Overall transfer level: Independent Equipment used: None                Ambulation/Gait Ambulation/Gait assistance: Supervision;Independent Ambulation Distance (Feet): 800 Feet Assistive device: None Gait Pattern/deviations: Step-through pattern;Decreased stride length     General Gait Details: Slow, controlled amb with no DME; progressing to indep. SpO2 down to 88% on RA, returning to >90% with deep breathing  Stairs            Wheelchair Mobility    Modified Rankin (Stroke Patients Only)       Balance Overall balance assessment: Needs assistance   Sitting balance-Leahy Scale: Good       Standing balance-Leahy Scale: Good                                Pertinent Vitals/Pain Pain Assessment: No/denies pain    Home Living Family/patient expects to be discharged to:: Private residence Living Arrangements: Spouse/significant other Available Help at Discharge: Family;Available 24 hours/day Type of Home: House Home Access: Ramped entrance     Home Layout: One level        Prior Function Level of Independence: Independent         Comments: Babysits great-grandkids often     Hand Dominance        Extremity/Trunk Assessment   Upper Extremity Assessment Upper Extremity Assessment: Overall WFL for tasks assessed    Lower Extremity Assessment Lower Extremity Assessment: Overall WFL for tasks assessed       Communication   Communication: No difficulties  Cognition Arousal/Alertness: Awake/alert Behavior During Therapy: WFL for tasks assessed/performed Overall Cognitive Status: Within Functional Limits for tasks assessed                                        General Comments General comments (skin integrity, edema, etc.): Son present during session    Exercises     Assessment/Plan    PT Assessment Patent does not need any further PT services  PT Problem List         PT Treatment Interventions      PT Goals (Current  goals can be found in the Care Plan section)  Acute Rehab PT Goals PT Goal Formulation: All assessment and education complete, DC therapy    Frequency     Barriers to discharge        Co-evaluation               AM-PAC PT "6 Clicks" Daily Activity  Outcome Measure Difficulty turning over in bed (including adjusting bedclothes, sheets and blankets)?: None Difficulty moving from lying on back to sitting on the side of the bed? : None Difficulty sitting down on and standing up from a chair with arms (e.g., wheelchair, bedside commode, etc,.)?: None Help needed moving to and from a bed to chair (including a wheelchair)?: None Help needed walking in hospital  room?: None Help needed climbing 3-5 steps with a railing? : None 6 Click Score: 24    End of Session Equipment Utilized During Treatment: Gait belt Activity Tolerance: Patient tolerated treatment well Patient left: in chair;with call bell/phone within reach;with family/visitor present Nurse Communication: Mobility status PT Visit Diagnosis: Other abnormalities of gait and mobility (R26.89)    Time: 9977-4142 PT Time Calculation (min) (ACUTE ONLY): 18 min   Charges:   PT Evaluation $PT Eval Moderate Complexity: 1 Mod     PT G Codes:       Mabeline Caras, PT, DPT Acute Rehab Services  Pager: [place] 04/11/2017, 12:35 PM

## 2017-04-11 NOTE — Progress Notes (Signed)
PROGRESS NOTE    Dana Horton  NIO:[id number] DOB: Oct 26, 1945 DOA: 04/07/2017 PCP: Townsend Roger, MD    Brief Narrative:  72 year old female who was admitted to the cardiology service due to atrial fibrillation with rapid ventricular response, HTN, COPD. Patient has been placed on flecainide. Cardiac catheterization was negative for ischemia. Follow-up chest x-ray due to dyspnea showed left lower lobe infiltrate.    Assessment & Plan:   Principal Problem:   Atrial fibrillation with rapid ventricular response (HCC) Active Problems:   PAF (paroxysmal atrial fibrillation) (HCC)   Demand ischemia (HCC)   Non-ST elevation (NSTEMI) myocardial infarction [facility])   Essential hypertension   COPD mixed type (HCC)   OSA on CPAP   Acute on chronic diastolic congestive heart failure (HCC)   Elevated troponin   HCAP (healthcare-associated pneumonia)   1. Left lower lobe infiltrate. Personally reviewed chest film from 01/15 with left lower lobe infiltrate, will continue antibiotic therapy with Zosyn for now, will dc Vancomycin, continue bronchodilator therapy, oxymetry monitoring and supplemental 02 per [place].   2. Atrial fibrillation. Continue rate control with metoprolol and flecainide, anticoagulation with apixaban.   3. Dyslipidemia. Continue statin therapy  4. Hypokalemia. Serum K at 3,3 with serum cr at 0.79 will replete K with KCl target of 4, will follow renal panel in am.   DVT prophylaxis: apixaban  Code Status: full Family Communication: No family at the bedside Disposition Plan: home   Consultants:     Procedures:     Antimicrobials:   Zosyn IV    Subjective: Patient feeling better, dyspnea has been improving, no chest pain, no nausea or vomiting. Tolerating po well.   Objective: Vitals:   04/11/17 1340 04/11/17 1400 04/11/17 1408 04/11/17 1410  BP: 140/62 (!) 119/48 (!) 119/48   Pulse: 79 85 87   Resp: (!) 24 (!) 25 (!) 21   Temp:        TempSrc:      SpO2: 90% 93% 100% 97%  Weight:      Height:        Intake/Output Summary (Last 24 hours) at 04/11/2017 1601 Last data filed at 04/11/2017 1400 Gross per 24 hour  Intake 630 ml  Output 700 ml  Net -70 ml   Filed Weights   04/09/17 0437 04/10/17 0610 04/11/17 0500  Weight: 81 kg (178 lb 9.2 oz) 79.6 kg (175 lb 7.8 oz) 76 kg (167 lb 8 oz)    Examination:   General: Not in pain or dyspnea  Neurology: Awake and alert, non focal  E ENT: mild pallor, no icterus, oral mucosa moist Cardiovascular: No JVD. S1-S2 present, rhythmic, no gallops, rubs, or murmurs. No lower extremity edema. Pulmonary: decreased breath sounds bilaterally at bases, adequate air movement, no wheezing, rhonchi or rales. Gastrointestinal. Abdomen flat, no organomegaly, non tender, no rebound or guarding Skin. No rashes Musculoskeletal: no joint deformities     Data Reviewed: I have personally reviewed following labs and imaging studies  CBC: Recent Labs  Lab 04/08/17 0538 04/10/17 0900 04/11/17 0323  WBC 12.4* 11.2* 9.4  HGB 12.4 10.9* 10.7*  HCT 37.5 33.7* 32.5*  MCV 94.2 94.1 94.5  PLT 150 166 818   Basic Metabolic Panel: Recent Labs  Lab 04/08/17 0538 04/09/17 0229 04/10/17 0900 04/11/17 0323  NA 139 139 137 141  K 3.6 3.6 3.4* 3.3*  CL 104 104 104 108  CO2 26 26 22 23   GLUCOSE 89 121* 142* 113*  BUN 23* 21*  13 13  CREATININE 0.74 0.82 0.72 0.79  CALCIUM 8.5* 8.2* 8.1* 8.3*   GFR: Estimated Creatinine Clearance: 62.9 mL/min (by C-G formula based on SCr of 0.79 mg/dL). Liver Function Tests: No results for input(s): AST, ALT, ALKPHOS, BILITOT, PROT, ALBUMIN in the last 168 hours. No results for input(s): LIPASE, AMYLASE in the last 168 hours. No results for input(s): AMMONIA in the last 168 hours. Coagulation Profile: No results for input(s): INR, PROTIME in the last 168 hours. Cardiac Enzymes: Recent Labs  Lab 04/08/17 2241  TROPONINI 0.74*   BNP (last 3  results) No results for input(s): PROBNP in the last 8760 hours. HbA1C: No results for input(s): HGBA1C in the last 72 hours. CBG: No results for input(s): GLUCAP in the last 168 hours. Lipid Profile: No results for input(s): CHOL, HDL, LDLCALC, TRIG, CHOLHDL, LDLDIRECT in the last 72 hours. Thyroid Function Tests: No results for input(s): TSH, T4TOTAL, FREET4, T3FREE, THYROIDAB in the last 72 hours. Anemia Panel: No results for input(s): VITAMINB12, FOLATE, FERRITIN, TIBC, IRON, RETICCTPCT in the last 72 hours.    Radiology Studies: I have reviewed all of the imaging during this hospital visit personally     Scheduled Meds: . apixaban  5 mg Oral BID  . atorvastatin  80 mg Oral q1800  . chlorhexidine  15 mL Mouth Rinse BID  . flecainide  100 mg Oral Q12H  . fluticasone  2 spray Each Nare Daily  . ipratropium  0.5 mg Nebulization Q6H  . loratadine  10 mg Oral Daily  . mouth rinse  15 mL Mouth Rinse q12n4p  . metoprolol succinate  50 mg Oral QHS  . sodium chloride flush  3 mL Intravenous Q12H   Continuous Infusions: . sodium chloride    . piperacillin-tazobactam (ZOSYN)  IV 3.375 g (04/11/17 1204)  . sodium chloride       LOS: 4 days        Tawni Millers, MD [facility] Pager [phone]

## 2017-04-12 ENCOUNTER — Encounter: Payer: Self-pay | Admitting: Cardiovascular Disease

## 2017-04-12 DIAGNOSIS — J189 Pneumonia, unspecified organism: Secondary | ICD-10-CM

## 2017-04-12 LAB — CBC
HCT: 29.7 % — ABNORMAL LOW (ref 36.0–46.0)
Hemoglobin: 9.7 g/dL — ABNORMAL LOW (ref 12.0–15.0)
MCH: 30.6 pg (ref 26.0–34.0)
MCHC: 32.7 g/dL (ref 30.0–36.0)
MCV: 93.7 fL (ref 78.0–100.0)
Platelets: 180 10*3/uL (ref 150–400)
RBC: 3.17 MIL/uL — ABNORMAL LOW (ref 3.87–5.11)
RDW: 16.2 % — ABNORMAL HIGH (ref 11.5–15.5)
WBC: 8.8 10*3/uL (ref 4.0–10.5)

## 2017-04-12 LAB — BASIC METABOLIC PANEL
Anion gap: 10 (ref 5–15)
BUN: 15 mg/dL (ref 6–20)
CO2: 22 mmol/L (ref 22–32)
Calcium: 8.3 mg/dL — ABNORMAL LOW (ref 8.9–10.3)
Chloride: 107 mmol/L (ref 101–111)
Creatinine, Ser: 0.82 mg/dL (ref 0.44–1.00)
GFR calc Af Amer: >60 mL/min (ref >60)
GFR calc non Af Amer: >60 mL/min (ref >60)
Glucose, Bld: 91 mg/dL (ref 65–99)
Potassium: 3.4 mmol/L — ABNORMAL LOW (ref 3.5–5.1)
Sodium: 139 mmol/L (ref 135–145)

## 2017-04-12 LAB — MAGNESIUM: Magnesium: 2 mg/dL (ref 1.7–2.4)

## 2017-04-12 MED ORDER — AMOXICILLIN-POT CLAVULANATE 875-125 MG PO TABS
1.0000 | ORAL_TABLET | Freq: Two times a day (BID) | ORAL | Status: DC
  Administered 2017-04-12 – 2017-04-13 (×2): 1 via ORAL
  Filled 2017-04-12 (×2): qty 1

## 2017-04-12 MED ORDER — AMOXICILLIN-POT CLAVULANATE 875-125 MG PO TABS: 1.0000 | ORAL_TABLET | Freq: Two times a day (BID) | ORAL | Status: DC

## 2017-04-12 MED ORDER — POTASSIUM CHLORIDE CRYS ER 20 MEQ PO TBCR
40.0000 meq | EXTENDED_RELEASE_TABLET | Freq: Three times a day (TID) | ORAL | Status: AC
  Administered 2017-04-12 (×3): 40 meq via ORAL
  Filled 2017-04-12 (×3): qty 2

## 2017-04-12 MED ORDER — IPRATROPIUM BROMIDE 0.02 % IN SOLN
0.5000 mg | Freq: Two times a day (BID) | RESPIRATORY_TRACT | Status: DC
  Administered 2017-04-12 – 2017-04-13 (×3): 0.5 mg via RESPIRATORY_TRACT
  Filled 2017-04-12 (×4): qty 2.5

## 2017-04-12 MED ORDER — FUROSEMIDE 20 MG PO TABS
20.0000 mg | ORAL_TABLET | Freq: Once | ORAL | Status: AC
  Administered 2017-04-12: 20 mg via ORAL
  Filled 2017-04-12: qty 1

## 2017-04-12 NOTE — Discharge Instructions (Signed)
You have an appointment set up with the [facility].  Multiple studies have shown that being followed by a dedicated atrial fibrillation clinic in addition to the standard care you receive from your other physicians improves health. We believe that enrollment in the atrial fibrillation clinic will allow [place] to better care for you.   The phone number to the [facility] is [phone]. The clinic is staffed Monday through Friday from 8:30am to 5pm.  Parking Directions: The clinic is located in the Heart and Vascular Building connected to [facility]. 1)From [address] turn on to [address] and go to the 3rd entrance  (Heart and Vascular entrance) on the right. 2)Look to the right for Heart &Vascular Parking Garage. 3)A code for the entrance is required please call the clinic to receive this.   4)Take the elevators to the 1st floor. Registration is in the room with the glass walls at the end of the hallway.  If you have any trouble parking or locating the clinic, please dont hesitate to call [phone].   Information on my medicine - ELIQUIS (apixaban)  This medication education was reviewed with me or my healthcare representative as part of my discharge preparation.  The pharmacist that spoke with me during my hospital stay was:  Betsey Holiday, [facility]  Why was Eliquis prescribed for you? Eliquis was prescribed for you to reduce the risk of a blood clot forming that can cause a stroke if you have a medical condition called atrial fibrillation (a type of irregular heartbeat).  What do You need to know about Eliquis ? Take your Eliquis TWICE DAILY - one tablet in the morning and one tablet in the evening with or without food. If you have difficulty swallowing the tablet whole please discuss with your pharmacist how to take the medication safely.  Take Eliquis exactly as prescribed by your doctor and DO NOT stop taking Eliquis without  talking to the doctor who prescribed the medication.  Stopping may increase your risk of developing a stroke.  Refill your prescription before you run out.  After discharge, you should have regular check-up appointments with your healthcare provider that is prescribing your Eliquis.  In the future your dose may need to be changed if your kidney function or weight changes by a significant amount or as you get older.  What do you do if you miss a dose? If you miss a dose, take it as soon as you remember on the same day and resume taking twice daily.  Do not take more than one dose of ELIQUIS at the same time to make up a missed dose.  Important Safety Information A possible side effect of Eliquis is bleeding. You should call your healthcare provider right away if you experience any of the following: ? Bleeding from an injury or your nose that does not stop. ? Unusual colored urine (red or dark brown) or unusual colored stools (red or black). ? Unusual bruising for unknown reasons. ? A serious fall or if you hit your head (even if there is no bleeding).  Some medicines may interact with Eliquis and might increase your risk of bleeding or clotting while on Eliquis. To help avoid this, consult your healthcare provider or pharmacist prior to using any new prescription or non-prescription medications, including herbals, vitamins, non-steroidal anti-inflammatory drugs (NSAIDs) and supplements.  This website has more information on Eliquis (apixaban): http://www.eliquis.com/eliquis/home

## 2017-04-12 NOTE — Care Management Important Message (Signed)
Important Message  Patient Details  Name: Dana Horton MRN: [id number] Date of Birth: Jan 13, 1946   Medicare Important Message Given:  Yes    Yanni Quiroa [address]ine [address] 04/12/2017, 2:04 PM

## 2017-04-12 NOTE — Progress Notes (Signed)
PROGRESS NOTE    Dana Horton  BJS:[id number] DOB: 1945-06-16 DOA: 04/07/2017 PCP: Townsend Roger, MD    Brief Narrative:  72 year old female who was admitted to the cardiology service due to atrial fibrillation with rapid ventricular response, HTN, COPD. Patient has been placed on flecainide. Cardiac catheterization was negative for ischemia. Follow-up chest x-ray due to dyspnea showed left lower lobe infiltrate    Assessment & Plan:   Principal Problem:   Atrial fibrillation with rapid ventricular response (HCC) Active Problems:   PAF (paroxysmal atrial fibrillation) (HCC)   Demand ischemia (HCC)   Non-ST elevation (NSTEMI) myocardial infarction [facility])   Essential hypertension   COPD mixed type (HCC)   OSA on CPAP   Acute on chronic diastolic congestive heart failure (HCC)   Elevated troponin   HCAP (healthcare-associated pneumonia)   1. Left lower lobe infiltrate. Continue bronchodilator therapy and incentive spirometer, will change antibiotic therapy to Augmentin and will plan for a total of 5 days. Patient has extensive Hx of tobacco abuse, frequent episodes of cough at home, high pretest probability for COPD, will need outpatient PFT.   2. Atrial fibrillation. Metoprolol and flecainide for rate , continue anticoagulation with apixaban.   3. Dyslipidemia. On statin therapy  4. Hypokalemia. Serum K at 3,4 with serum cr at 0.82. Po kcl for repletion, follow renal panel in am.  DVT prophylaxis: apixaban  Code Status: full Family Communication: No family at the bedside Disposition Plan: home   Consultants:     Procedures:     Antimicrobials:   Zosyn IV     Subjective: Patient feeling better, cough improved, at home has history of tobacco abuse intermittent chronic cough.  Objective: Vitals:   04/11/17 2132 04/12/17 0004 04/12/17 0351 04/12/17 0838  BP:  122/66 (!) 133/58   Pulse:   67   Resp:   19   Temp:  97.6 F (36.4 C)  97.9 F (36.6 C)   TempSrc:  Axillary Oral   SpO2: 98%  92% 91%  Weight:   76.4 kg (168 lb 6.4 oz)   Height:        Intake/Output Summary (Last 24 hours) at 04/12/2017 1150 Last data filed at 04/11/2017 2221 Gross per 24 hour  Intake 230 ml  Output -  Net 230 ml   Filed Weights   04/10/17 0610 04/11/17 0500 04/12/17 0351  Weight: 79.6 kg (175 lb 7.8 oz) 76 kg (167 lb 8 oz) 76.4 kg (168 lb 6.4 oz)    Examination:   General: Not in pain or dyspnea,deconditoned Neurology: Awake and alert, non focal  E ENT: mild pallor, no icterus, oral mucosa moist Cardiovascular: No JVD. S1-S2 present, rhythmic, no gallops, rubs, or murmurs. trace lower extremity edema. Pulmonary: vesicular breath sounds bilaterally, adequate air movement, no wheezing, scatered rhonchi, but no rales. Gastrointestinal. Abdomen flat, no organomegaly, non tender, no rebound or guarding Skin. No rashes Musculoskeletal: no joint deformities     Data Reviewed: I have personally reviewed following labs and imaging studies  CBC: Recent Labs  Lab 04/08/17 0538 04/10/17 0900 04/11/17 0323 04/12/17 0324  WBC 12.4* 11.2* 9.4 8.8  HGB 12.4 10.9* 10.7* 9.7*  HCT 37.5 33.7* 32.5* 29.7*  MCV 94.2 94.1 94.5 93.7  PLT 150 166 163 607   Basic Metabolic Panel: Recent Labs  Lab 04/08/17 0538 04/09/17 0229 04/10/17 0900 04/11/17 0323 04/12/17 0324  NA 139 139 137 141 139  K 3.6 3.6 3.4* 3.3* 3.4*  CL 104 104  104 108 107  CO2 26 26 22 23 22   GLUCOSE 89 121* 142* 113* 91  BUN 23* 21* 13 13 15   CREATININE 0.74 0.82 0.72 0.79 0.82  CALCIUM 8.5* 8.2* 8.1* 8.3* 8.3*  MG  --   --   --   --  2.0   GFR: Estimated Creatinine Clearance: 61.6 mL/min (by C-G formula based on SCr of 0.82 mg/dL). Liver Function Tests: No results for input(s): AST, ALT, ALKPHOS, BILITOT, PROT, ALBUMIN in the last 168 hours. No results for input(s): LIPASE, AMYLASE in the last 168 hours. No results for input(s): AMMONIA in the last 168  hours. Coagulation Profile: No results for input(s): INR, PROTIME in the last 168 hours. Cardiac Enzymes: Recent Labs  Lab 04/08/17 2241  TROPONINI 0.74*   BNP (last 3 results) No results for input(s): PROBNP in the last 8760 hours. HbA1C: No results for input(s): HGBA1C in the last 72 hours. CBG: No results for input(s): GLUCAP in the last 168 hours. Lipid Profile: No results for input(s): CHOL, HDL, LDLCALC, TRIG, CHOLHDL, LDLDIRECT in the last 72 hours. Thyroid Function Tests: No results for input(s): TSH, T4TOTAL, FREET4, T3FREE, THYROIDAB in the last 72 hours. Anemia Panel: No results for input(s): VITAMINB12, FOLATE, FERRITIN, TIBC, IRON, RETICCTPCT in the last 72 hours.    Radiology Studies: I have reviewed all of the imaging during this hospital visit personally     Scheduled Meds: . amoxicillin-clavulanate  1 tablet Oral Q12H  . apixaban  5 mg Oral BID  . atorvastatin  80 mg Oral q1800  . chlorhexidine  15 mL Mouth Rinse BID  . flecainide  100 mg Oral Q12H  . fluticasone  2 spray Each Nare Daily  . ipratropium  0.5 mg Nebulization BID  . loratadine  10 mg Oral Daily  . mouth rinse  15 mL Mouth Rinse q12n4p  . metoprolol succinate  50 mg Oral QHS  . potassium chloride  40 mEq Oral TID  . sodium chloride flush  3 mL Intravenous Q12H   Continuous Infusions: . sodium chloride    . sodium chloride       LOS: 5 days        Tawni Millers, MD [facility] Pager [phone]

## 2017-04-12 NOTE — Progress Notes (Signed)
Progress Note  Patient Name: Lorenzo Pereyra [place] Date of Encounter: 04/12/2017  Primary Cardiologist: Skeet Latch, MD  EP : Dr. Lovena Le  Subjective   Breathing improving. No chest pain.   Inpatient Medications    Scheduled Meds: . apixaban  5 mg Oral BID  . atorvastatin  80 mg Oral q1800  . chlorhexidine  15 mL Mouth Rinse BID  . flecainide  100 mg Oral Q12H  . fluticasone  2 spray Each Nare Daily  . furosemide  20 mg Oral Once  . ipratropium  0.5 mg Nebulization BID  . loratadine  10 mg Oral Daily  . mouth rinse  15 mL Mouth Rinse q12n4p  . metoprolol succinate  50 mg Oral QHS  . potassium chloride  40 mEq Oral TID  . sodium chloride flush  3 mL Intravenous Q12H   Continuous Infusions: . sodium chloride    . piperacillin-tazobactam (ZOSYN)  IV 3.375 g (04/12/17 0609)  . sodium chloride     PRN Meds: sodium chloride, acetaminophen, guaiFENesin, levalbuterol, ondansetron (ZOFRAN) IV, sodium chloride flush   Vital Signs    Vitals:   04/11/17 2132 04/12/17 0004 04/12/17 0351 04/12/17 0838  BP:  122/66 (!) 133/58   Pulse:   67   Resp:   19   Temp:  97.6 F (36.4 C) 97.9 F (36.6 C)   TempSrc:  Axillary Oral   SpO2: 98%  92% 91%  Weight:   168 lb 6.4 oz (76.4 kg)   Height:        Intake/Output Summary (Last 24 hours) at 04/12/2017 1035 Last data filed at 04/11/2017 2221 Gross per 24 hour  Intake 230 ml  Output -  Net 230 ml   Filed Weights   04/10/17 0610 04/11/17 0500 04/12/17 0351  Weight: 175 lb 7.8 oz (79.6 kg) 167 lb 8 oz (76 kg) 168 lb 6.4 oz (76.4 kg)    Telemetry    NSR- Personally Reviewed  ECG    N/A  Physical Exam   GEN: No acute distress.   Neck: No JVD Cardiac: RRR, no murmurs, rubs, or gallops.  Respiratory: Course and diminished breath sound throughout GI: Soft, nontender, non-distended  MS: trace BL  Edema L > R; No deformity. Left IV heparin access site at anti cubital area has mild swelling  Neuro:  Nonfocal    Psych: Normal affect   Labs    Chemistry Recent Labs  Lab 04/10/17 0900 04/11/17 0323 04/12/17 0324  NA 137 141 139  K 3.4* 3.3* 3.4*  CL 104 108 107  CO2 22 23 22   GLUCOSE 142* 113* 91  BUN 13 13 15   CREATININE 0.72 0.79 0.82  CALCIUM 8.1* 8.3* 8.3*  GFRNONAA >60 >60 >60  GFRAA >60 >60 >60  ANIONGAP 11 10 10      Hematology Recent Labs  Lab 04/10/17 0900 04/11/17 0323 04/12/17 0324  WBC 11.2* 9.4 8.8  RBC 3.58* 3.44* 3.17*  HGB 10.9* 10.7* 9.7*  HCT 33.7* 32.5* 29.7*  MCV 94.1 94.5 93.7  MCH 30.4 31.1 30.6  MCHC 32.3 32.9 32.7  RDW 16.5* 16.5* 16.2*  PLT 166 163 180    Cardiac Enzymes Recent Labs  Lab 04/08/17 2241  TROPONINI 0.74*   No results for input(s): TROPIPOC in the last 168 hours.     Radiology    No results found.  Cardiac Studies   LEFT HEART CATH AND CORONARY ANGIOGRAPHY  Conclusion     The left ventricular systolic function  is normal.  LV end diastolic pressure is normal. LVEDP 16 mm Hg.  The left ventricular ejection fraction is greater than 65% by visual estimate.  There is no aortic valve stenosis.  No angiographically apparent CAD.   Continue medical therapy.  Restart heparin gtt 8 hours after sheath pull for AFib/stroke prevention.    Patient Profile      72 y.o. female with PMH of PAF, HTN, COPD, Bladder Ca, tobacco abuse (quit 03/26/17) and OSA on Cpap who presented with chest pain and rapid heart rate at [facility] emergency room where she was found to be in atrial fibrillation with a rapid ventricular response.  She ruled in for a non-ST elevation MI.  On amiodarone intravenous, she reverted back to sinus rhythm after attempts to cardiovert her were unsuccessful. [facility] and plan.   Assessment & Plan   Principal Problem:   Atrial fibrillation with rapid ventricular response (HCC) Active Problems:   PAF (paroxysmal atrial fibrillation) (HCC)   COPD mixed type (HCC)    Acute on chronic diastolic congestive heart failure (HCC)   HCAP (healthcare-associated pneumonia)   Essential hypertension   OSA on CPAP   Demand ischemia (HCC)   Non-ST elevation (NSTEMI) myocardial infarction (HCC)   Elevated troponin   1. Afib RVR with chronic paroxysmal A. Fib: - Went back into Afib RVR with hypotension and dyspnea at transferred. Placed on IV amiodarone, and Dilt. Back in SR. EP felt that amiodarone  is probably not a good long-term option for her. Held eliquis for cath to find any evidence of CAD which showed minimal evidence of CAD.  Seen by EP post cath -- > restarted flecainide at 100 mg daily.  Amiodarone discontinued.  -- Eliquis restarted post cath -may consider watchman device -- Continue beta-blocker -- Plan is for her to be followed up in EP clinic after discharge  2. NSTEMI - DEMAND ISCHEMIA  --Trop peaked at 7 while at [facility]. --  Interestingly, this is probably more of a Type 2  demand ischemia MI in this fact that she had minimal CAD on cath despite having troponin of 7  --  No chest pain -- For now given the evidence of myocardial injury, will continue with high-dose statin, but can likely reduce upon outpatient follow-up.  3. HTN: -- stable and well controlled with current therapy  4. Dyspnea/COPD/Pneumonia -- CXR prior to admission showed small left side pleural effusion. Given IV lasix yesterday with improvement - - Repeat chest x-ray checkedsuggest possible pneumonia --> hospitalist consult to assist with pneumonia management. Stopped IV abx. Appreciate recommendations.  - She has mild LE edema today. Will give lasix po 20mg  x 1. - start low dose PO 20-40 mg Lasix for d/c & d/c home HCTZ  5. OSA on Cpap: -- compliant  6. HL:  - on statin  7. Hypokalemia -- Will give Kdur 40 meq x 3 today with one dose of lasix as described above.   For questions or updates, please contact [place] Please consult [email or web address] for  contact info under Cardiology/STEMI.      SignedLeanor Kail, PA  04/12/2017, 10:35 AM    I have seen, examined and evaluated the patient this PM along with Leanor Kail, PA .  After reviewing all the available data and chart, we discussed the patients laboratory, study & physical findings as well as symptoms in detail. I agree with his findings, examination as well as impression recommendations as per  our discussion.    Attending adjustments noted in italics.   Aubrii looks and feels much better today than she did yesterday.  Breathing comfortably.  Still coughing up a little bit, but notably improved from before. Appreciate the internal medicine service assistance.  The plan is to have converted from Zosyn to Augmentin to complete 5 days total to treat what appears to be hospital-acquired pneumonia with left upper lobe infiltrate.  Agree with low-dose Lasix over HCTZ for discharge.  Provided she is doing well tomorrow morning anticipate discharge once she is evaluated.  She will complete her antibiotic course.   Glenetta Hew, M.D., M.S. Interventional Cardiologist   Pager # [phone] Phone # [phone] [address] [place], [place] [address]

## 2017-04-13 LAB — BASIC METABOLIC PANEL
Anion gap: 11 (ref 5–15)
BUN: 12 mg/dL (ref 6–20)
CO2: 22 mmol/L (ref 22–32)
Calcium: 8.7 mg/dL — ABNORMAL LOW (ref 8.9–10.3)
Chloride: 106 mmol/L (ref 101–111)
Creatinine, Ser: 0.76 mg/dL (ref 0.44–1.00)
GFR calc Af Amer: >60 mL/min (ref >60)
GFR calc non Af Amer: >60 mL/min (ref >60)
Glucose, Bld: 83 mg/dL (ref 65–99)
Potassium: 4.3 mmol/L (ref 3.5–5.1)
Sodium: 139 mmol/L (ref 135–145)

## 2017-04-13 LAB — CBC
HCT: 30.9 % — ABNORMAL LOW (ref 36.0–46.0)
Hemoglobin: 10.1 g/dL — ABNORMAL LOW (ref 12.0–15.0)
MCH: 30.8 pg (ref 26.0–34.0)
MCHC: 32.7 g/dL (ref 30.0–36.0)
MCV: 94.2 fL (ref 78.0–100.0)
Platelets: 228 10*3/uL (ref 150–400)
RBC: 3.28 MIL/uL — ABNORMAL LOW (ref 3.87–5.11)
RDW: 16.2 % — ABNORMAL HIGH (ref 11.5–15.5)
WBC: 8.5 10*3/uL (ref 4.0–10.5)

## 2017-04-13 MED ORDER — POTASSIUM CHLORIDE ER 10 MEQ PO TBCR: 10.0000 meq | EXTENDED_RELEASE_TABLET | Freq: Every day | ORAL | 3 refills | Status: DC

## 2017-04-13 MED ORDER — FLECAINIDE ACETATE 100 MG PO TABS: 100.0000 mg | ORAL_TABLET | Freq: Two times a day (BID) | ORAL | 6 refills | Status: DC

## 2017-04-13 MED ORDER — ATORVASTATIN CALCIUM 80 MG PO TABS: 80.0000 mg | ORAL_TABLET | Freq: Every day | ORAL | 6 refills | Status: DC

## 2017-04-13 MED ORDER — FUROSEMIDE 20 MG PO TABS: 20.0000 mg | ORAL_TABLET | ORAL | 1 refills | Status: DC | PRN

## 2017-04-13 MED ORDER — AMOXICILLIN-POT CLAVULANATE 875-125 MG PO TABS: 1.0000 | ORAL_TABLET | Freq: Two times a day (BID) | ORAL | 0 refills | Status: DC

## 2017-04-13 NOTE — Progress Notes (Signed)
Patient given discharge instructions medication list and patient prescription sent to personal pharmacy. Patient IV and tele dcd will discharge home as ordered. Transported to exit via wheelchair with nursing staff. Alayne Estrella, Bettina Gavia RN

## 2017-04-13 NOTE — Discharge Summary (Signed)
Discharge Summary    Patient ID: Dana Horton,  MRN: [id number], DOB/AGE: 72/08/1945 72 y.o.  Admit date: 04/07/2017 Discharge date: 04/13/2017  Primary Care Provider: Townsend Roger Primary Cardiologist: Skeet Latch, MD  EP : Dr. 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 Albuterol Palpitations  . Lisinopril Cough    Diagnostic Studies/Procedures   LEFT HEART CATH AND CORONARY ANGIOGRAPHY  Conclusion     The left ventricular systolic function is normal.  LV end diastolic pressure is normal. LVEDP 16 mm Hg.  The left ventricular ejection fraction is greater than 65% by visual estimate.  There is no aortic valve stenosis.  No angiographically apparent CAD.  Continue medical therapy.  Restart heparin gtt 8 hours after sheath pull for AFib/stroke prevention.      History of Present Illness    72 y.o.femalewith PMH of PAF, HTN, COPD, Bladder Ca, tobacco abuse (quit 03/26/17) and OSA on Cpap who presented with chest pain and rapid heart rate at [facility] emergency room where she was found to be in atrial fibrillation with a rapid ventricular response. She ruled in for a non-ST elevation MI. On amiodarone intravenous, she reverted back to sinus rhythm after attempts to cardiovert her were unsuccessful. [facility] treatment and plan.   Hospital Course     Consultants: [place]  1. Afib RVR with chronic paroxysmal A. Fib: - Went back into Afib RVR with  hypotension and dyspnea at transferred. Placed on IV amiodarone, and Dilt. Back in SR. EP felt that amiodarone is probably not a good long-term option for her. Held eliquis for cath to find any evidence of CAD which showed minimal evidence of CAD.  Seen by EP post cath -- > restarted flecainide at  Increased dose of 100 mg BID.Marland Kitchen Amiodarone discontinued.  -- Eliquis restarted post cath -may consider watchman device -- Continue beta-blocker -- Plan is for her to be followed up in EP clinic after discharge  2. NSTEMI- DEMAND ISCHEMIA  --Trop peaked at 7 while at [facility]. --  Interestingly, this is probably more of a Type 2 demand ischemia MI in this fact that she had minimal CAD on cath despite having troponin of 7. No chest pain -- For now given the evidence of myocardial injury, will continue with high-dose statin, but can likely reduce upon outpatient follow-up.  3. HTN: -- stable and well controlled with current therapy  4. Dyspnea/COPD/Pneumonia -- CXR prior to admission showed small left side pleural effusion. Given IV lasix yesterday with improvement - - Repeat chest x-ray checkedsuggest possible pneumonia --> hospitalist consult to assist with pneumonia management. Stopped IV abx. Appreciate recommendations.  -- Her dyspnea and edema improved after addition of Low dose lasix yesterday. Will resume home HCTZ. Take PRN lasix for SOB/LE edema. Further adjustment as outpatient.  - Continue Augmentin to complete 5 days  5. OSA on Cpap: -- Compliant  6. HL:  - on statin  7. Hypokalemia --Resolved.   8. Tobacco abuse - Says quit.  The patient has been seen by Dr. Rayann Heman  today and deemed ready for discharge home. All follow-up appointments have been scheduled. Discharge medications are listed below.    Discharge Vitals Blood pressure (!) 113/53, pulse 74, temperature (!) 97 F (36.1 C), temperature source Axillary, resp. rate (!) 31, height 5\' 3"  (1.6 m), weight 165 lb  (74.8 kg), SpO2 92 %.  Filed Weights   04/11/17 0500 04/12/17 0351 04/13/17 0426  Weight: 167 lb 8 oz (76 kg) 168 lb 6.4 oz (76.4 kg) 165 lb (74.8 kg)   Physical Exam  Constitutional: She is oriented to person, place, and time and well-developed, well-nourished, and in no distress.  HENT:  Head: Normocephalic and atraumatic.  Eyes: Pupils are equal, round, and reactive to light.  Neck: Normal range of motion. Neck supple.  Cardiovascular: Normal rate and regular rhythm.  Pulmonary/Chest: Effort normal and breath sounds normal.  Abdominal: Soft.  Musculoskeletal: Normal range of motion.  Neurological: She is alert and oriented to person, place, and time.  Skin: Skin is warm and dry.  Psychiatric: Affect normal.   Labs & Radiologic Studies     CBC Recent Labs    04/12/17 0324 04/13/17 0419  WBC 8.8 8.5  HGB 9.7* 10.1*  HCT 29.7* 30.9*  MCV 93.7 94.2  PLT 180 510   Basic Metabolic Panel Recent Labs    04/12/17 0324 04/13/17 0419  NA 139 139  K 3.4* 4.3  CL 107 106  CO2 22 22  GLUCOSE 91 83  BUN 15 12  CREATININE 0.82 0.76  CALCIUM 8.3* 8.7*  MG 2.0  --      Dg Chest Port 1 View  Result Date: 04/09/2017 CLINICAL DATA:  Shortness of breath today. EXAM: PORTABLE CHEST 1 VIEW COMPARISON:  Single-view of the chest 04/07/2017. Single-view of the chest and CT chest 03/27/2017. FINDINGS: The lungs are emphysematous. Bibasilar airspace disease is worse on the left and appears unchanged since most recent exam. No pneumothorax. Small left effusion noted. Heart size is normal. IMPRESSION: No change in left basilar airspace disease most compatible with pneumonia. Mild right basilar airspace disease also persist. Emphysema. Electronically Signed   By: Inge Rise M.D.   On: 04/09/2017 10:44    Disposition   Pt is being discharged home today in good condition.  Follow-up Plans & Appointments    Follow-up Information    [facility] Follow up  on 04/15/2017.   Specialty:  Cardiology Why:  at 1:30PM  Contact information: [address] [id number] [place] [address] [place], [place], [place]. Go on 05/01/2017.   Specialty:  Cardiology Why:  @9 :40am for hospital follow up Contact information: [address] [place] [address] [phone]          Discharge Instructions    Diet - low sodium heart healthy   Complete by:  As directed    Discharge instructions   Complete by:  As directed    No driving for 48 hours. No lifting over 5 lbs for 1 week. No sexual activity for 1 week. Keep procedure site clean & dry. If you notice increased pain, swelling, bleeding or pus, call/return!  You may shower, but no soaking baths/hot tubs/pools for 1 week.   Increase activity slowly   Complete by:  As directed       Discharge Medications   Allergies as of 04/13/2017      Reactions  Albuterol Palpitations   Lisinopril Cough      Medication List    STOP taking these medications   metoprolol tartrate 25 MG tablet Commonly known as:  LOPRESSOR   predniSONE 10 MG (48) Tbpk tablet Commonly known as:  STERAPRED UNI-PAK 48 TAB     TAKE these medications   amoxicillin-clavulanate 875-125 MG tablet Commonly known as:  AUGMENTIN Take 1 tablet by mouth every 12 (twelve) hours.   apixaban 5 MG Tabs tablet Commonly known as:  ELIQUIS Take 1 tablet (5 mg total) 2 (two) times daily by mouth.   atorvastatin 80 MG tablet Commonly known as:  LIPITOR Take 1 tablet (80 mg total) by mouth daily at 6 PM.   CALCIUM 600/VITAMIN D3 PO Take 1 tablet by mouth 2 (two) times daily.   cetirizine 10 MG tablet Commonly known as:  ZYRTEC Take 10 mg by mouth daily.   flecainide 100 MG tablet Commonly known as:  TAMBOCOR Take 1 tablet (100 mg total) by mouth 2 (two) times daily. START MEDICATION ON 02/05/17 What changed:    medication strength  how much to take   fluticasone 50  MCG/ACT nasal spray Commonly known as:  FLONASE Place 2 sprays into both nostrils daily.   furosemide 20 MG tablet Commonly known as:  LASIX Take 1 tablet (20 mg total) by mouth as needed. For shortness of breath or lower extremity edema.   hydrochlorothiazide 25 MG tablet Commonly known as:  HYDRODIURIL Take 25 mg by mouth daily.   metoprolol succinate 50 MG 24 hr tablet Commonly known as:  TOPROL XL Take 1 tablet (50 mg total) by mouth at bedtime. Take with or immediately following a meal. What changed:    how much to take  when to take this  additional instructions   potassium chloride 10 MEQ tablet Commonly known as:  K-DUR Take 1 tablet (10 mEq total) by mouth daily. Take additional 54meq when taking lasix.         Outstanding Labs/Studies   BMET during follow up Consider OP f/u labs 6-8 weeks given statin initiation this admission.  Duration of Discharge Encounter   Greater than 30 minutes including physician time.  Signed, Leanor Kail PA-C 04/13/2017, 11:39 AM  I have seen, examined the patient, and reviewed the above assessment and plan.  Changes to above are made where necessary.  On exam, ambulatory, RRR, course BS.  Feels that her breathing is ok for discharge.  Remains in sinus rhythm.  Flecainide per Dr Lovena Le.  Will need close outpatient follow-up with primary care for SOB.  DC to home.  Co Sign: Thompson Grayer, MD 04/13/2017 11:50 AM

## 2017-04-15 ENCOUNTER — Ambulatory Visit (HOSPITAL_COMMUNITY)
Admission: RE | Admit: 2017-04-15 | Discharge: 2017-04-15 | Disposition: A | Discharge status: Home / Self Care | Payer: Medicare Other | Source: Ambulatory Visit | Attending: Nurse Practitioner | Admitting: Nurse Practitioner

## 2017-04-15 ENCOUNTER — Encounter (HOSPITAL_COMMUNITY): Payer: Self-pay | Admitting: Nurse Practitioner

## 2017-04-15 VITALS — BP 118/72 | HR 77 | Ht 63.0 in | Wt 164.4 lb

## 2017-04-15 DIAGNOSIS — J449 Chronic obstructive pulmonary disease, unspecified: Secondary | ICD-10-CM | POA: Diagnosis not present

## 2017-04-15 DIAGNOSIS — I480 Paroxysmal atrial fibrillation: Secondary | ICD-10-CM | POA: Diagnosis not present

## 2017-04-15 DIAGNOSIS — Z8249 Family history of ischemic heart disease and other diseases of the circulatory system: Secondary | ICD-10-CM | POA: Diagnosis not present

## 2017-04-15 DIAGNOSIS — Z79899 Other long term (current) drug therapy: Secondary | ICD-10-CM | POA: Insufficient documentation

## 2017-04-15 DIAGNOSIS — G4733 Obstructive sleep apnea (adult) (pediatric): Secondary | ICD-10-CM | POA: Insufficient documentation

## 2017-04-15 DIAGNOSIS — I10 Essential (primary) hypertension: Secondary | ICD-10-CM | POA: Diagnosis not present

## 2017-04-15 DIAGNOSIS — M069 Rheumatoid arthritis, unspecified: Secondary | ICD-10-CM | POA: Insufficient documentation

## 2017-04-15 DIAGNOSIS — I252 Old myocardial infarction: Secondary | ICD-10-CM | POA: Diagnosis not present

## 2017-04-15 DIAGNOSIS — J069 Acute upper respiratory infection, unspecified: Secondary | ICD-10-CM | POA: Insufficient documentation

## 2017-04-15 DIAGNOSIS — F17210 Nicotine dependence, cigarettes, uncomplicated: Secondary | ICD-10-CM | POA: Diagnosis not present

## 2017-04-15 DIAGNOSIS — Z7901 Long term (current) use of anticoagulants: Secondary | ICD-10-CM | POA: Diagnosis not present

## 2017-04-15 DIAGNOSIS — I4891 Unspecified atrial fibrillation: Secondary | ICD-10-CM | POA: Diagnosis not present

## 2017-04-15 LAB — CULTURE, RESPIRATORY W GRAM STAIN

## 2017-04-15 LAB — BASIC METABOLIC PANEL
Anion gap: 12 (ref 5–15)
BUN: 13 mg/dL (ref 6–20)
CO2: 27 mmol/L (ref 22–32)
Calcium: 9.6 mg/dL (ref 8.9–10.3)
Chloride: 101 mmol/L (ref 101–111)
Creatinine, Ser: 0.92 mg/dL (ref 0.44–1.00)
GFR calc Af Amer: >60 mL/min (ref >60)
GFR calc non Af Amer: >60 mL/min (ref >60)
Glucose, Bld: 96 mg/dL (ref 65–99)
Potassium: 3.9 mmol/L (ref 3.5–5.1)
Sodium: 140 mmol/L (ref 135–145)

## 2017-04-15 LAB — CULTURE, BLOOD (ROUTINE X 2)
Culture: NO GROWTH
Culture: NO GROWTH
Special Requests: ADEQUATE
Special Requests: ADEQUATE

## 2017-04-15 NOTE — Progress Notes (Signed)
Primary Care Physician: Townsend Roger, MD Referring Physician:  Dr. Lawrence Marseilles Dana Horton is a 72 y.o. female with a h/o  PAF, HTN, COPD, Bladder Ca, tobacco abuse (quit 03/26/17)and OSA on Cpap who presented with chest pain and rapid heart rate [facility] emergency room where she was found to be in atrial fibrillation with a rapid ventricular response.She ruled in for a non-ST elevation MI. On amiodarone intravenous, she reverted back to sinus rhythm after attempts to cardiovert her were unsuccessful.Transferred to [facility] for further treatment and plan.  She went back into Afib RVR with hypotension and dyspneaat transfer. Was placed on IV amiodarone, and Dilt and returned to  [place]. EP felt thatamiodaroneis probably not a good long-term option for her.Held eliquis for cath to find any evidence of CAD which showed minimal evidence of CAD.Seen by EP post cath and restartedflecainide at increased dose of 100 mg BID.Marland Kitchen Amiodarone discontinued. Eliquis restarted post cath. She was to continue BB.  Her troponin peaked at 7 and thought to be a  Type 2 demand ischemia MI in the fact that she had minimal CAD on cath.  She was treated for pneumonia after CXR suggested this. Stopped IV antibiotics and d/c on po antibiotics. Was also diuresed in the hospital.  In the afib clinic today, pt is feeling improved. She is in NSR. Shortness of breath, cough also improved. Has4-5 days left of antibiotics to that.  Today, she denies symptoms of palpitations, chest pain, shortness of breath, orthopnea, PND, lower extremity edema, dizziness, presyncope, syncope, or neurologic sequela. The patient is tolerating medications without difficulties and is otherwise without complaint today.   Past Medical History:  Diagnosis Date  . Allergy   . Arthritis, rheumatoid ([place])   . Atrial fibrillation ([place])   . Cancer ([place])    bladder  . COPD (chronic obstructive pulmonary disease) ([place])     . Essential hypertension   . Heart murmur   . Lung nodule   . Myocardial infarction [facility])    ? small with atrial fib   . Osteoporosis   . Pneumonia    hx of   . Seasonal allergies   . Sleep apnea    cpap  setting at 2.5 per patient   . Tobacco abuse    Past Surgical History:  Procedure Laterality Date  . ABDOMINAL HYSTERECTOMY    . BLADDER SURGERY    . COLONOSCOPY N/A 12/28/2016   Procedure: COLONOSCOPY;  Surgeon: Doran Stabler, MD;  Location: [facility];  Service: Gastroenterology;  Laterality: N/A;  . LEFT HEART CATH AND CORONARY ANGIOGRAPHY N/A 04/09/2017   Procedure: LEFT HEART CATH AND CORONARY ANGIOGRAPHY;  Surgeon: Jettie Booze, MD;  Location: [place] CV LAB;  Service: Cardiovascular;  Laterality: N/A;  . MOUTH SURGERY    . POLYPECTOMY N/A 12/28/2016   Procedure: POLYPECTOMY with injection of Elevue;  Surgeon: Doran Stabler, MD;  Location: [facility];  Service: Gastroenterology;  Laterality: N/A;    Current Outpatient Medications  Medication Sig Dispense Refill  . amoxicillin-clavulanate (AUGMENTIN) 875-125 MG tablet Take 1 tablet by mouth every 12 (twelve) hours. 8 tablet 0  . apixaban (ELIQUIS) 5 MG TABS tablet Take 1 tablet (5 mg total) 2 (two) times daily by mouth. 60 tablet 5  . atorvastatin (LIPITOR) 80 MG tablet Take 1 tablet (80 mg total) by mouth daily at 6 PM. 30 tablet 6  . Calcium Carb-Cholecalciferol (CALCIUM 600/VITAMIN D3 PO) Take 1 tablet by  mouth 2 (two) times daily.    . cetirizine (ZYRTEC) 10 MG tablet Take 10 mg by mouth daily.    . flecainide (TAMBOCOR) 100 MG tablet Take 1 tablet (100 mg total) by mouth 2 (two) times daily. START MEDICATION ON 02/05/17 60 tablet 6  . fluticasone (FLONASE) 50 MCG/ACT nasal spray Place 2 sprays into both nostrils daily.    . furosemide (LASIX) 20 MG tablet Take 1 tablet (20 mg total) by mouth as needed. For shortness of breath or lower extremity edema. 30 tablet 1  . hydrochlorothiazide  (HYDRODIURIL) 25 MG tablet Take 25 mg by mouth daily.    . metoprolol succinate (TOPROL-XL) 50 MG 24 hr tablet Take 50 mg by mouth daily. Take with or immediately following a meal.    . potassium chloride (K-DUR) 10 MEQ tablet Take 1 tablet (10 mEq total) by mouth daily. Take additional 43meq when taking lasix. 30 tablet 3   No current facility-administered medications for this encounter.     Allergies  Allergen Reactions  . Albuterol Palpitations  . Lisinopril Cough    Social History   Socioeconomic History  . Marital status: Married    Spouse name: Not on file  . Number of children: Not on file  . Years of education: Not on file  . Highest education level: Not on file  Social Needs  . Financial resource strain: Not on file  . Food insecurity - worry: Not on file  . Food insecurity - inability: Not on file  . Transportation needs - medical: Not on file  . Transportation needs - non-medical: Not on file  Occupational History  . Not on file  Tobacco Use  . Smoking status: Current Every Day Smoker    Packs/day: 0.50    Years: 52.00    Pack years: 26.00  . Smokeless tobacco: Never Used  . Tobacco comment: 1/2 pack a day  Substance and Sexual Activity  . Alcohol use: No  . Drug use: No  . Sexual activity: Not on file  Other Topics Concern  . Not on file  Social History Narrative  . Not on file    Family History  Problem Relation Age of Onset  . Hypertension Mother   . Stroke Mother   . Cancer Father   . Heart disease Father   . CAD Father   . Arrhythmia Father   . Bladder Cancer Brother   . Lung cancer Brother     ROS- All systems are reviewed and negative except as per the HPI above  Physical Exam: Vitals:   04/15/17 1329  BP: 118/72  Pulse: 77  Weight: 164 lb 6.4 oz (74.6 kg)  Height: 5\' 3"  (1.6 m)   Wt Readings from Last 3 Encounters:  04/15/17 164 lb 6.4 oz (74.6 kg)  04/13/17 165 lb (74.8 kg)  01/29/17 163 lb 12.8 oz (74.3 kg)    Labs: Lab  Results  Component Value Date   NA 140 04/15/2017   K 3.9 04/15/2017   CL 101 04/15/2017   CO2 27 04/15/2017   GLUCOSE 96 04/15/2017   BUN 13 04/15/2017   CREATININE 0.92 04/15/2017   CALCIUM 9.6 04/15/2017   MG 2.0 04/12/2017   No results found for: INR No results found for: CHOL, HDL, LDLCALC, TRIG   GEN- The patient is well appearing, alert and oriented x 3 today.   Head- normocephalic, atraumatic Eyes-  Sclera clear, conjunctiva pink Ears- hearing intact Oropharynx- clear Neck- supple, no JVP  Lymph- no cervical lymphadenopathy Lungs- Clear to ausculation bilaterally, normal work of breathing Heart- Regular rate and rhythm, no murmurs, rubs or gallops, PMI not laterally displaced GI- soft, NT, ND, + BS Extremities- no clubbing, cyanosis, or edema MS- no significant deformity or atrophy Skin- no rash or lesion Psych- euthymic mood, full affect Neuro- strength and sensation are intact  EKG- NSR at 77 bpm, pr int 200 ms qrs int at 96 ms, qtc at 463 ms  Cardiac cathThe left ventricular systolic function is normal.  LV end diastolic pressure is normal. LVEDP 16 mm Hg.  The left ventricular ejection fraction is greater than 65% by visual estimate.  There is no aortic valve stenosis.  No angiographically apparent CAD.   Continue medical therapy.   Assessment and Plan: 1. Afib  RVR possibly in the setting of URI/pneumonia Continue flecainide 100 mg bid, continue metoprolol at 50 mg daily Continue eliquis 5 mg bid with a chadsvasc score of at least 4  2. Positive troponin 2/2 demand ischemia Normal cardiac cath  3. URI Pneumonia by chest xray Feels improved Finish oral antibiotics   F/u with Dr. Oval Linsey early February as scheduled   Geroge Baseman. Lizet Kelso, [facility] [address] [place],  [address] [phone]

## 2017-04-16 NOTE — Consult Note (Signed)
            [facility] CM Primary Care Navigator  04/16/2017  Dana Horton Fendrick 06-30-1945 [id number]   Attempt to see patient to identify possible discharge needs but she was already discharged. Patient was admitted for chest pain and rapid heart rate (atrial fibrillation with a rapid ventricular response possibly in the setting of URI/ pneumonia)  Patient was discharged home over the weekend.  Primary care provider's office called (spoke with Rosemarie Beath) to notify of patient's discharge and need for post hospital follow-up and transition of care. Notified of patient's health issues needing follow-up. Made aware to refer patient to [facility] care management if deemed necessary and appropriate for services.  Patient has discharge instruction tofollow-up with Atrial Fibrillation Clinic on 04/15/17 and cardiology on 05/01/17.   For additional questions please contact:  Edwena Felty A. Coutney Wildermuth, BSN, RN-BC [facility] PRIMARY CARE Navigator Cell: [phone]

## 2017-04-19 LAB — CULTURE, RESPIRATORY W GRAM STAIN

## 2017-05-01 ENCOUNTER — Encounter: Payer: Self-pay | Admitting: Cardiovascular Disease

## 2017-05-01 ENCOUNTER — Ambulatory Visit (INDEPENDENT_AMBULATORY_CARE_PROVIDER_SITE_OTHER): Payer: Medicare Other | Admitting: Cardiovascular Disease

## 2017-05-01 VITALS — BP 104/62 | HR 69 | Ht 63.5 in | Wt 160.2 lb

## 2017-05-01 DIAGNOSIS — I10 Essential (primary) hypertension: Secondary | ICD-10-CM

## 2017-05-01 DIAGNOSIS — I480 Paroxysmal atrial fibrillation: Secondary | ICD-10-CM | POA: Diagnosis not present

## 2017-05-01 NOTE — Patient Instructions (Signed)
Medication Instructions:  DECREASE YOUR HYDROCHLOROTHIAZIDE 25 MG 1/2 TABLET DAILY   Labwork: NONE  Testing/Procedures: NONE  Follow-Up: Your physician wants you to follow-up in: [place] will receive a reminder letter in the mail two months in advance. If you don't receive a letter, please call our office to schedule the follow-up appointment.  If you need a refill on your cardiac medications before your next appointment, please call your pharmacy.

## 2017-05-01 NOTE — Progress Notes (Signed)
Cardiology Office Note   Date:  05/01/2017   ID:  Earnest Bailey Irion, DOB 1945-09-26, MRN [id number]  PCP:  Townsend Roger, MD  Cardiologist:   Skeet Latch, MD   Chief Complaint  Patient presents with  . Follow-up    History of Present Illness: Foye Haggart Priest is a 72 y.o. female with paroxysmal atrial fibrillation, hypertension, COPD and OSA who presents for follow-up.  She was initially seen 10/2016 for an evaluation of chest pain.  Ms. Whitelock saw Dr. Vassie Loll Eyk on 6/21 and reported chest pain.  At that appointment she was noted to be in atrial fibrillation with a ventricular rate of 140 bpm.  She was referred to [facility] where she reportedly converted back to sinus rhythm when her IV was inserted.  Echo that admission revealed LVEF 60-65% with mild mitral and tricuspid regurgitation.  She was started on metoprolol and they recommended that she take a full strength aspirin. She did this but developed hematochezia. She is always have hematochezia when she tries to take a full strength aspirin or NSAIDS.  She reported fatigue and metoprolol was switched to nightly.  She was also referred to GI. She saw Dr.Danis and had an upper endoscopy that was normal.  She also had a colonoscopy that showed 1-2 mm polyp that was removed and another 12 mm polyp that could not be resected as well as a 10 mm polyp in the sigmoid colon for which resection was not attempted.  Her bleeding was thought to be due to to intermittent benign anal bleeding.  She returned for colonoscopy 12/2016 with removal of more polyps.  She was also found to have diverticuli throughout the entire colon.  It was also noted that she had internal hemorrhoids.  It was felt to be safe for her to start anticoagulation 2 weeks following her colonoscopy.  At her last appointment Ms. Scarantino was switched from amiodarone to flecainide due to cost concerns.  At that time she was admitted  03/2017 with atrial fibrillation with rapid ventricular response.  She was started on IV amiodarone and converted back to rhythm.  Troponin was elevated.  She underwent left heart catheterization that revealed normal coronary arteries.  Flecainide was increased to 100 mg.  After being discharged from the hospital she slept for 2 weeks.  She is just trying to get back into her usual routine.  Her breathing has improved significantly.  She also quit smoking cold [place] on March 26, 2017.  She has not noted any recurrent palpitations.  She denies chest pain.  She plans to start back doing her housework but has not been doing any formal exercise yet.  She denies lower extremity edema, orthopnea, or PND.  Her only complaint is that she sometimes gets dizzy.  Blood pressure at home is typically in the 100s.  Sometimes it drops as low as the 63O or 75I systolic, which is when she feels lightheaded.  She denies syncope.   Past Medical History:  Diagnosis Date  . Allergy   . Arthritis, rheumatoid ([place])   . Atrial fibrillation ([place])   . Cancer ([place])    bladder  . COPD (chronic obstructive pulmonary disease) ([place])   . Essential hypertension   . Heart murmur   . Lung nodule   . Myocardial infarction [facility])    ? small with atrial fib   . Osteoporosis   . Pneumonia    hx of   . Seasonal allergies   .  Sleep apnea    cpap  setting at 2.5 per patient   . Tobacco abuse     Past Surgical History:  Procedure Laterality Date  . ABDOMINAL HYSTERECTOMY    . BLADDER SURGERY    . COLONOSCOPY N/A 12/28/2016   Procedure: COLONOSCOPY;  Surgeon: Doran Stabler, MD;  Location: [facility];  Service: Gastroenterology;  Laterality: N/A;  . LEFT HEART CATH AND CORONARY ANGIOGRAPHY N/A 04/09/2017   Procedure: LEFT HEART CATH AND CORONARY ANGIOGRAPHY;  Surgeon: Jettie Booze, MD;  Location: [place] CV LAB;  Service: Cardiovascular;  Laterality: N/A;  . MOUTH SURGERY    . POLYPECTOMY N/A 12/28/2016    Procedure: POLYPECTOMY with injection of Elevue;  Surgeon: Doran Stabler, MD;  Location: [facility];  Service: Gastroenterology;  Laterality: N/A;      Current Outpatient Medications  Medication Sig Dispense Refill  . amoxicillin-clavulanate (AUGMENTIN) 875-125 MG tablet Take 1 tablet by mouth every 12 (twelve) hours. 8 tablet 0  . apixaban (ELIQUIS) 5 MG TABS tablet Take 1 tablet (5 mg total) 2 (two) times daily by mouth. 60 tablet 5  . atorvastatin (LIPITOR) 80 MG tablet Take 1 tablet (80 mg total) by mouth daily at 6 PM. 30 tablet 6  . Calcium Carb-Cholecalciferol (CALCIUM 600/VITAMIN D3 PO) Take 1 tablet by mouth 2 (two) times daily.    . cetirizine (ZYRTEC) 10 MG tablet Take 10 mg by mouth daily.    . flecainide (TAMBOCOR) 100 MG tablet Take 1 tablet (100 mg total) by mouth 2 (two) times daily. START MEDICATION ON 02/05/17 60 tablet 6  . fluticasone (FLONASE) 50 MCG/ACT nasal spray Place 2 sprays into both nostrils daily.    . furosemide (LASIX) 20 MG tablet Take 1 tablet (20 mg total) by mouth as needed. For shortness of breath or lower extremity edema. 30 tablet 1  . hydrochlorothiazide (HYDRODIURIL) 25 MG tablet Take 25 mg by mouth as directed. 1/2 tablet by mouth daily    . metoprolol succinate (TOPROL-XL) 50 MG 24 hr tablet Take 50 mg by mouth daily. Take with or immediately following a meal.    . potassium chloride (K-DUR) 10 MEQ tablet Take 1 tablet (10 mEq total) by mouth daily. Take additional 63meq when taking lasix. 30 tablet 3   No current facility-administered medications for this visit.     Allergies:   Albuterol and Lisinopril    Social History:  The patient  reports that she quit smoking about 5 weeks ago. She has a 26.00 pack-year smoking history. she has never used smokeless tobacco. She reports that she does not drink alcohol or use drugs.   Family History:  The patient's family history includes Arrhythmia in her father; Bladder Cancer in her brother; CAD in  her father; Cancer in her father; Heart disease in her father; Hypertension in her mother; Lung cancer in her brother; Stroke in her mother.    ROS:  Please see the history of present illness.   Otherwise, review of systems are positive for none.   All other systems are reviewed and negative.    PHYSICAL EXAM: VS:  BP 104/62   Pulse 69   Ht 5' 3.5" (1.613 m)   Wt 160 lb 3.2 oz (72.7 kg)   LMP  (LMP Unknown)   BMI 27.93 kg/m  , BMI Body mass index is 27.93 kg/m. GENERAL:  Well appearing HEENT: Pupils equal round and reactive, fundi not visualized, oral mucosa unremarkable NECK:  No  jugular venous distention, waveform within normal limits, carotid upstroke brisk and symmetric, no bruits, no thyromegaly LYMPHATICS:  No cervical adenopathy LUNGS:  Clear to auscultation bilaterally HEART:  RRR.  PMI not displaced or sustained,S1 and S2 within normal limits, no S3, no S4, no clicks, no rubs, III/VI holosystolic murmur at the apex with radiation anteriorly ABD:  Flat, positive bowel sounds normal in frequency in pitch, no bruits, no rebound, no guarding, no midline pulsatile mass, no hepatomegaly, no splenomegaly EXT:  2 plus pulses throughout, no edema, no cyanosis no clubbing SKIN:  No rashes no nodules NEURO:  Cranial nerves II through XII grossly intact, motor grossly intact throughout PSYCH:  Cognitively intact, oriented to person place and time   EKG:  EKG is not ordered today. The ekg ordered 10/24/16 demonstrates sinus bradycardia. Rate 53 bpm.  Echo 09/14/16: LVEF 60-65%.  Abnormal diastolic function.  Mild mitral regurgitation and tricuspid regurgitation.  PASP 31 mmHg.  Recent Labs: 04/12/2017: Magnesium 2.0 04/13/2017: Hemoglobin 10.1; Platelets 228 04/15/2017: BUN 13; Creatinine, Ser 0.92; Potassium 3.9; Sodium 140   03/14/16: WBC 8.1, hemoglobin 13.2, hematocrit 38.9, platelets 195 Sodium 143, potassium 3.6, BUN 18, creatinine 0.88 AST 14, ALT 8 Chol 178, triglycerides  199, HDL 33, LDL 102 TSH 0.83  LHC 04/09/17:  The left ventricular systolic function is normal.  LV end diastolic pressure is normal. LVEDP 16 mm Hg.  The left ventricular ejection fraction is greater than 65% by visual estimate.  There is no aortic valve stenosis.  No angiographically apparent CAD.  Lipid Panel No results found for: CHOL, TRIG, HDL, CHOLHDL, VLDL, LDLCALC, LDLDIRECT    Wt Readings from Last 3 Encounters:  05/01/17 160 lb 3.2 oz (72.7 kg)  04/15/17 164 lb 6.4 oz (74.6 kg)  04/13/17 165 lb (74.8 kg)      ASSESSMENT AND PLAN:  # Paroxysmal atrial fibrillation:  Ms. Jarvis had recurrent atrial fibrillation in the setting of pneumonia.  Flecainide was increased.  She has been doing well and has no recurrence since discharge.  Continue flecainide, metoprolol, and Eliquis.  She inquired whether she would be a candidate for ablation.  At this point given her comorbidities we will continue with flecainide.  If she has recurrence consider referral to EP for consideration of ablation.  This patients CHA2DS2-VASc Score and unadjusted Ischemic Stroke Rate (% per year) is equal to 3.2 % stroke rate/year from a score of 3  Above score calculated as 1 point each if present [CHF, HTN, DM, Vascular=MI/PAD/Aortic Plaque, Age if 65-74, or Female] Above score calculated as 2 points each if present [Age > 75, or Stroke/TIA/TE]  # Hypertension: Blood pressure is well-controlled.  However her blood pressure is running low.  We will reduce HCTZ to 12.5mg .  continue metoprolol.  # Tobacco abuse: Patient congratulated on cessation.   Current medicines are reviewed at length with the patient today.  The patient does not have concerns regarding medicines.  The following changes have been made: stop dronedarone.  Reduce HCTZ to 12.5mg . Labs/ tests ordered today include:  No orders of the defined types were placed in this encounter.    Disposition:   FU with Adrianah Prophete C.  Oval Linsey, MD, [facility] in 6 months    This note was written with the assistance of speech recognition software.  Please excuse any transcriptional errors.  Signed, Rozell Kettlewell C. Oval Linsey, MD, [facility]  05/01/2017 11:25 AM    [place]

## 2017-05-06 ENCOUNTER — Telehealth: Payer: Self-pay | Admitting: Cardiovascular Disease

## 2017-05-06 DIAGNOSIS — Z5181 Encounter for therapeutic drug level monitoring: Secondary | ICD-10-CM

## 2017-05-06 NOTE — Telephone Encounter (Signed)
Spoke with patient and she stated she was taking her Furosemide and HCTZ both daily. She has only been taking

## 2017-05-06 NOTE — Telephone Encounter (Signed)
New message   Pt c/o medication issue:  1. Name of Medication: patient stop taken ALL medication    2. How are you currently taking this medication (dosage and times per day)? Patient stop taken ALL medication   3. Are you having a reaction (difficulty breathing--STAT)? Patient stop taken ALL medication    4. What is your medication issue? Kidney stop working - unable to urinate

## 2017-05-06 NOTE — Telephone Encounter (Signed)
Spoke with patient and she was taking the Furosemide 20 mg, HCTZ 12.5 mg, and Potassium Chloride 10 meq all daily.  The Furosemide was just to be as needed. She did stop taking them all on Friday secondary to not being able to urinate, this has improved. Her blood pressure has been running 88/47-136/58. When SBP below 100 feels tired. Discussed with Rhonda B PA and will have patient come for BMET and orthostatic blood pressure. Advised patient, verbalized understanding.

## 2017-05-07 ENCOUNTER — Ambulatory Visit: Payer: Medicare Other | Admitting: *Deleted

## 2017-05-07 DIAGNOSIS — I480 Paroxysmal atrial fibrillation: Secondary | ICD-10-CM

## 2017-05-07 DIAGNOSIS — Z5181 Encounter for therapeutic drug level monitoring: Secondary | ICD-10-CM | POA: Diagnosis not present

## 2017-05-07 LAB — BASIC METABOLIC PANEL
BUN/Creatinine Ratio: 23 (ref 12–28)
BUN: 17 mg/dL (ref 8–27)
CO2: 28 mmol/L (ref 20–29)
Calcium: 8.8 mg/dL (ref 8.7–10.3)
Chloride: 103 mmol/L (ref 96–106)
Creatinine, Ser: 0.74 mg/dL (ref 0.57–1.00)
GFR calc Af Amer: 94 mL/min/{1.73_m2} (ref >59)
GFR calc non Af Amer: 82 mL/min/{1.73_m2} (ref >59)
Glucose: 85 mg/dL (ref 65–99)
Potassium: 4.1 mmol/L (ref 3.5–5.2)
Sodium: 145 mmol/L — ABNORMAL HIGH (ref 134–144)

## 2017-05-07 NOTE — Progress Notes (Signed)
1.) Reason for visit: Lightheaded, dizziness, low BP  2.) Name of MD requesting visit: Rosaria Ferries PA  3.) H&P: see telephone note 2/11  4.) ROS related to problem:   5.) Assessment and plan per MD:     Orthostatic BP   Lying: 119/68 64  Sitting: 113/64 63  Standing: 112/60 62  Patient ambulated-O2 90-92%, HR 67-70.  Patient denied dizziness/lightheadedness.  Minor SOB, but patient states she is recovering from PNA as well.   Reviewed with Rosaria Ferries PA, no changed recommended at this time.  Will review lab results and call patient with changes based on results.  Patient informed to stay hydrated and continue to monitor BP. Patient aware and verbalized understanding.

## 2017-05-29 DIAGNOSIS — C674 Malignant neoplasm of posterior wall of bladder: Secondary | ICD-10-CM | POA: Diagnosis not present

## 2017-05-29 DIAGNOSIS — N3020 Other chronic cystitis without hematuria: Secondary | ICD-10-CM | POA: Diagnosis not present

## 2017-06-10 ENCOUNTER — Other Ambulatory Visit: Payer: Self-pay | Admitting: Cardiovascular Disease

## 2017-06-10 NOTE — Telephone Encounter (Signed)
REFILL 

## 2017-06-10 NOTE — Telephone Encounter (Signed)
Refill Request.  

## 2017-06-13 ENCOUNTER — Other Ambulatory Visit: Payer: Self-pay | Admitting: *Deleted

## 2017-06-13 MED ORDER — FUROSEMIDE 20 MG PO TABS: ORAL_TABLET | ORAL | 6 refills | Status: DC

## 2017-06-13 NOTE — Telephone Encounter (Signed)
Rx has been sent to the pharmacy electronically. ° °

## 2017-06-27 ENCOUNTER — Telehealth: Payer: Self-pay | Admitting: Cardiovascular Disease

## 2017-06-27 NOTE — Telephone Encounter (Signed)
Pt c/o medication issue:  1. Name of Medication: Metoprolol Succinate   2. How are you currently taking this medication (dosage and times per day)? 25 mg 2xday   3. Are you having a reaction (difficulty breathing--STAT)? NO   4. What is your medication issue?  Patient has a bottle that states that take 25 mg twice daily but patient states she was told to take 50 mg before bed.Patient states that she would like to verify which dosage instructions are correct.

## 2017-06-27 NOTE — Telephone Encounter (Signed)
Returned call to patient who states she was told to take metoprolol succinate 50mg  before bedtime but her Rx was 25mg  BID. Explained that this med is long-acting so she can take this medication before bed as directed by MD. Changed instructions in Epic to reflect this.

## 2017-07-05 DIAGNOSIS — I10 Essential (primary) hypertension: Secondary | ICD-10-CM | POA: Diagnosis not present

## 2017-07-30 ENCOUNTER — Other Ambulatory Visit: Payer: Self-pay | Admitting: Cardiovascular Disease

## 2017-10-07 DIAGNOSIS — I480 Paroxysmal atrial fibrillation: Secondary | ICD-10-CM | POA: Diagnosis not present

## 2017-10-07 DIAGNOSIS — I10 Essential (primary) hypertension: Secondary | ICD-10-CM | POA: Diagnosis not present

## 2017-10-14 ENCOUNTER — Telehealth: Payer: Self-pay

## 2017-10-14 NOTE — Telephone Encounter (Signed)
Sent referral to scheduling and filed notes 

## 2017-10-25 ENCOUNTER — Other Ambulatory Visit: Payer: Self-pay

## 2017-10-28 ENCOUNTER — Other Ambulatory Visit: Payer: Self-pay | Admitting: Cardiovascular Disease

## 2017-10-28 ENCOUNTER — Other Ambulatory Visit: Payer: Self-pay | Admitting: Physician Assistant

## 2017-11-05 ENCOUNTER — Encounter: Payer: Self-pay | Admitting: Cardiology

## 2017-11-05 ENCOUNTER — Ambulatory Visit (INDEPENDENT_AMBULATORY_CARE_PROVIDER_SITE_OTHER): Payer: Medicare Other | Admitting: Cardiology

## 2017-11-05 VITALS — BP 128/72 | HR 66 | Ht 63.5 in | Wt 169.6 lb

## 2017-11-05 DIAGNOSIS — I480 Paroxysmal atrial fibrillation: Secondary | ICD-10-CM

## 2017-11-05 DIAGNOSIS — I10 Essential (primary) hypertension: Secondary | ICD-10-CM

## 2017-11-05 MED ORDER — DILTIAZEM HCL ER COATED BEADS 120 MG PO CP24: 120.0000 mg | ORAL_CAPSULE | Freq: Every day | ORAL | 1 refills | Status: DC

## 2017-11-05 NOTE — Progress Notes (Signed)
Electrophysiology Office Note   Date:  11/05/2017   ID:  Dana Horton, DOB Sep 14, 1945, MRN [id number]  PCP:  Dana Roger, MD  Cardiologist:  Dana Horton Primary Electrophysiologist:  Dana Tomas Meredith Leeds, MD    No chief complaint on file.    History of Present Illness: Dana Horton is a 72 y.o. female who is being seen today for the evaluation of atrial fibrillation at the request of Dana Roger, MD. Presenting today for electrophysiology evaluation.  She has a history of atrial fibrillation, COPD, hypertension, sleep apnea on CPAP, and rheumatoid arthritis.  She was initially seen 10/2016 for the evaluation of chest pain.  At that appointment, she was noted to be in atrial fibrillation with heart rates of 140 bpm.  She was referred to [facility] where she converted to sinus rhythm when her IV was inserted.  Echo revealed a normal ejection fraction with mild mitral and tricuspid regurgitation.  She was readmitted 03/2017 with atrial fibrillation and rapid response.  She was started on IV amiodarone and converted back to sinus rhythm.  Her troponin was elevated and she underwent left heart catheterization that showed normal coronaries.  Her flecainide was thus increased to 100 mg.  She quit smoking on March 26, 2017 cold [place].    Today, she denies symptoms of palpitations, chest pain, shortness of breath, orthopnea, PND, lower extremity edema, claudication, dizziness, presyncope, syncope, bleeding, or neurologic sequela. The patient is tolerating medications without difficulties.    Past Medical History:  Diagnosis Date  . Allergy   . Arthritis, rheumatoid ([place])   . Atrial fibrillation ([place])   . Cancer ([place])    bladder  . COPD (chronic obstructive pulmonary disease) ([place])   . Essential hypertension   . Heart murmur   . Lung nodule   . Myocardial infarction [facility])    ? small with atrial fib   . Osteoporosis   . Pneumonia    hx of   .  Seasonal allergies   . Sleep apnea    cpap  setting at 2.5 per patient   . Tobacco abuse    Past Surgical History:  Procedure Laterality Date  . ABDOMINAL HYSTERECTOMY    . BLADDER SURGERY    . COLONOSCOPY N/A 12/28/2016   Procedure: COLONOSCOPY;  Surgeon: Doran Stabler, MD;  Location: [facility];  Service: Gastroenterology;  Laterality: N/A;  . LEFT HEART CATH AND CORONARY ANGIOGRAPHY N/A 04/09/2017   Procedure: LEFT HEART CATH AND CORONARY ANGIOGRAPHY;  Surgeon: Jettie Booze, MD;  Location: [facility];  Service: Cardiovascular;  Laterality: N/A;  . MOUTH SURGERY    . POLYPECTOMY N/A 12/28/2016   Procedure: POLYPECTOMY with injection of [place];  Surgeon: Doran Stabler, MD;  Location: [facility];  Service: Gastroenterology;  Laterality: N/A;     Current Outpatient Medications  Medication Sig Dispense Refill  . atorvastatin (LIPITOR) 80 MG tablet TAKE 1 TABLET BY MOUTH DAILY AT 6:00 PM 90 tablet 0  . ELIQUIS 5 MG TABS tablet TAKE 1 TABLET BY MOUTH TWICE A DAY. 60 tablet 4  . flecainide (TAMBOCOR) 100 MG tablet Take 1 tablet (100 mg total) by mouth 2 (two) times daily. NEEDS F/U W/DR [place] 180 tablet 0  . fluticasone (FLONASE) 50 MCG/ACT nasal spray Place 2 sprays into both nostrils daily.    . furosemide (LASIX) 20 MG tablet TAKE 1 TABLET BY MOUTH DAILY IF NEEDED FOR SHORTNESS OF BREATH OR LOWER EXTEMITY EDEMA  90 tablet 3  . hydrochlorothiazide (HYDRODIURIL) 25 MG tablet Take 1 tablet by mouth daily.  1  . metoprolol succinate (TOPROL-XL) 25 MG 24 hr tablet TAKE 1 TABLET BY MOUTH TWICE A DAY 180 tablet 3  . potassium chloride (K-DUR) 10 MEQ tablet Take 1 tablet (10 mEq total) by mouth daily. Take additional 51meq when taking lasix. 30 tablet 3  . SYMBICORT 160-4.5 MCG/ACT inhaler Take 2 puffs by mouth 2 (two) times daily.  3   No current facility-administered medications for this visit.     Allergies:   Albuterol and Lisinopril   Social History:  The  patient  reports that she quit smoking about 7 months ago. She has a 26.00 pack-year smoking history. She has never used smokeless tobacco. She reports that she does not drink alcohol or use drugs.   Family History:  The patient's family history includes Arrhythmia in her father; Bladder Cancer in her brother; CAD in her father; Cancer in her father; Heart disease in her father; Hypertension in her mother; Lung cancer in her brother; Stroke in her mother.    ROS:  Please see the history of present illness.   Otherwise, review of systems is positive for weight change, fatigue, easy bruising.   All other systems are reviewed and negative.    PHYSICAL EXAM: VS:  BP 128/72   Pulse 66   Ht 5' 3.5" (1.613 m)   Wt 169 lb 9.6 oz (76.9 kg)   LMP  (LMP Unknown)   SpO2 95%   BMI 29.57 kg/m  , BMI Body mass index is 29.57 kg/m. GEN: Well nourished, well developed, in no acute distress  HEENT: normal  Neck: no JVD, carotid bruits, or masses Cardiac: RRR; 2 out of 6 systolic murmur at the base, no rubs, or gallops,no edema  Respiratory:  clear to auscultation bilaterally, normal work of breathing GI: soft, nontender, nondistended, + BS MS: no deformity or atrophy  Skin: warm and dry Neuro:  Strength and sensation are intact Psych: euthymic mood, full affect  EKG:  EKG is ordered today. Personal review of the ekg ordered shows sinus rhythm, rate 66  Recent Labs: 04/12/2017: Magnesium 2.0 04/13/2017: Hemoglobin 10.1; Platelets 228 05/07/2017: BUN 17; Creatinine, Ser 0.74; Potassium 4.1; Sodium 145    Lipid Panel  No results found for: CHOL, TRIG, HDL, CHOLHDL, VLDL, LDLCALC, LDLDIRECT   Wt Readings from Last 3 Encounters:  11/05/17 169 lb 9.6 oz (76.9 kg)  05/01/17 160 lb 3.2 oz (72.7 kg)  04/15/17 164 lb 6.4 oz (74.6 kg)      Other studies Reviewed: Additional studies/ records that were reviewed today include: [facility] 04/09/17  Review of the above records today demonstrates:   The left  ventricular systolic function is normal.  LV end diastolic pressure is normal. LVEDP 16 mm Hg.  The left ventricular ejection fraction is greater than 65% by visual estimate.  There is no aortic valve stenosis.  No angiographically apparent CAD.  TTE 09/14/17 LV size normal, ejection fraction 60 to 39% Impaired diastolic relaxation Mildly dilated left atrium Mild mitral regurgitation Mild tricuspid regurgitation  ASSESSMENT AND PLAN:  1.  Paroxysmal atrial fibrillation: Currently on flecainide, metoprolol, and Eliquis.  She is having quite a bit of fatigue which may be due to her metoprolol.  We Terese Heier stop metoprolol today and start her on diltiazem 120 mg.  He has not had any further atrial fibrillation since her hospitalization in December and she is quite content with  her control on flecainide.  This patients CHA2DS2-VASc Score and unadjusted Ischemic Stroke Rate (% per year) is equal to 3.2 % stroke rate/year from a score of 3  Above score calculated as 1 point each if present [CHF, HTN, DM, Vascular=MI/PAD/Aortic Plaque, Age if 65-74, or Female] Above score calculated as 2 points each if present [Age > 75, or Stroke/TIA/TE]  2.  Hypertension: Well-controlled.  No changes.  3.  Tobacco abuse: Quit smoking March 26, 2017 cold [place].  Congratulated on cessation.   Current medicines are reviewed at length with the patient today.   The patient does not have concerns regarding her medicines.  The following changes were made today: Stop metoprolol, start diltiazem  Labs/ tests ordered today include:  Orders Placed This Encounter  Procedures  . EKG 12-Lead     Disposition:   FU with Earlena Werst 6 months  Signed, Duayne Brideau Meredith Leeds, MD  11/05/2017 2:05 PM     [place] [address] [place] [address] [phone] (office) [phone] (fax)

## 2017-11-05 NOTE — Patient Instructions (Addendum)
Medication Instructions:  Your physician has recommended you make the following change in your medication:  1. STOP Metoprolol 2. START Diltiazem 120 mg once daily  * If you need a refill on your cardiac medications before your next appointment, please call your pharmacy.   Labwork: None ordered  Testing/Procedures: None ordered  Follow-Up: Your physician wants you to follow-up in: 6 months with Dr. Curt Bears in [place].  You will receive a reminder letter in the mail two months in advance. If you don't receive a letter, please call our office to schedule the follow-up appointment.  *Please note that any paperwork needing to be filled out by the provider will need to be addressed at the front desk prior to seeing the provider. Please note that any FMLA, disability or other documents regarding health condition is subject to a $25.00 charge that must be received prior to completion of paperwork in the form of a money order or check.  Thank you for choosing [facility]!!   Trinidad Curet, RN [phone]  Any Other Special Instructions Will Be Listed Below (If Applicable).  Diltiazem tablets What is this medicine? DILTIAZEM (dil TYE a zem) is a calcium-channel blocker. It affects the amount of calcium found in your heart and muscle cells. This relaxes your blood vessels, which can reduce the amount of work the heart has to do. This medicine is used to treat chest pain caused by angina. This medicine may be used for other purposes; ask your health care provider or pharmacist if you have questions. COMMON BRAND NAME(S): Cardizem What should I tell my health care provider before I take this medicine? They need to know if you have any of these conditions: -heart problems, low blood pressure, irregular heartbeat -liver disease -previous heart attack -an unusual or allergic reaction to diltiazem, other medicines, foods, dyes, or preservatives -pregnant or trying to get  pregnant -breast-feeding How should I use this medicine? Take this medicine by mouth with a glass of water. Follow the directions on the prescription label. Do not cut, crush or chew this medicine. This medicine is usually taken before meals and at bedtime. Take your doses at regular intervals. Do not take your medicine more often then directed. Do not stop taking except on the advice of your doctor or health care professional. Talk to your pediatrician regarding the use of this medicine in children. Special care may be needed. Overdosage: If you think you have taken too much of this medicine contact a poison control center or emergency room at once. NOTE: This medicine is only for you. Do not share this medicine with others. What if I miss a dose? If you miss a dose, take it as soon as you can. If it is almost time for your next dose, take only that dose. Do not take double or extra doses. What may interact with this medicine? Do not take this medicine with any of the following: -cisapride -hawthorn -pimozide -ranolazine -red yeast rice This medicine may also interact with the following medications: -buspirone -carbamazepine -cimetidine -cyclosporine -digoxin -local anesthetics or general anesthetics -lovastatin -medicines for anxiety or difficulty sleeping like midazolam and triazolam -medicines for high blood pressure or heart problems -quinidine -rifampin, rifabutin, or rifapentine This list may not describe all possible interactions. Give your health care provider a list of all the medicines, herbs, non-prescription drugs, or dietary supplements you use. Also tell them if you smoke, drink alcohol, or use illegal drugs. Some items may interact with your medicine. What  should I watch for while using this medicine? Check your blood pressure and pulse rate regularly. Ask your doctor or health care professional what your blood pressure and pulse rate should be and when you should  contact him or her. You may feel dizzy or lightheaded. Do not drive, use machinery, or do anything that needs mental alertness until you know how this medicine affects you. To reduce the risk of dizzy or fainting spells, do not sit or stand up quickly, especially if you are an older patient. Alcohol can make you more dizzy or increase flushing and rapid heartbeats. Avoid alcoholic drinks. What side effects may I notice from receiving this medicine? Side effects that you should report to your doctor or health care professional as soon as possible: -allergic reactions like skin rash, itching or hives, swelling of the face, lips, or tongue -confusion, mental depression -feeling faint or lightheaded, falls -pinpoint red spots on the skin -redness, blistering, peeling or loosening of the skin, including inside the mouth -slow, irregular heartbeat -swelling of the ankles, feet -unusual bleeding or bruising Side effects that usually do not require medical attention (report to your doctor or health care professional if they continue or are bothersome): -change in sex drive or performance -constipation or diarrhea -flushing of the face -headache -nausea, vomiting -tired or weak -trouble sleeping This list may not describe all possible side effects. Call your doctor for medical advice about side effects. You may report side effects to FDA at 1-800-FDA-1088. Where should I keep my medicine? Keep out of the reach of children. Store at room temperature between 20 and 25 degrees C (68 and 77 degrees F). Protect from light. Keep container tightly closed. Throw away any unused medicine after the expiration date. NOTE: This sheet is a summary. It may not cover all possible information. If you have questions about this medicine, talk to your doctor, pharmacist, or health care provider.  2018 Elsevier/Gold Standard (2013-02-23 10:54:31)

## 2017-11-11 ENCOUNTER — Telehealth: Payer: Self-pay | Admitting: Cardiology

## 2017-11-11 NOTE — Telephone Encounter (Signed)
New message  Pt c/o medication issue:  1. Name of Medication: diltiazem (CARDIZEM CD) 120 MG 24 hr capsule  2. How are you currently taking this medication (dosage and times per day)? Once daily during the day  3. Are you having a reaction (difficulty breathing--STAT)?no   4. What is your medication issue? Heart is beating fast at night, bp is low 85/51 last night 11/10/2017, 122/57 bp 73 hr on 11/11/2017, patient states that she is dehydrated

## 2017-11-11 NOTE — Telephone Encounter (Signed)
Spoke with patient, she explained that she might be dehydrated. The patient only drank two glass of water all day yesterday and she woke up with a faster heart rate and low blood pressure this morning. The patient drank water this morning and rechecked her blood pressure and it was 122/57. She denies any symptoms currently. Advised her to continue on her current medications and explained the importance of drinking at least 8 glasses of water a day. The patient is going to monitor her blood pressure and call back if it remains low in spite of proper hydration.

## 2017-11-12 DIAGNOSIS — R062 Wheezing: Secondary | ICD-10-CM | POA: Diagnosis not present

## 2017-11-12 DIAGNOSIS — J441 Chronic obstructive pulmonary disease with (acute) exacerbation: Secondary | ICD-10-CM | POA: Diagnosis not present

## 2017-11-12 DIAGNOSIS — R509 Fever, unspecified: Secondary | ICD-10-CM | POA: Diagnosis not present

## 2017-11-12 DIAGNOSIS — F17210 Nicotine dependence, cigarettes, uncomplicated: Secondary | ICD-10-CM | POA: Diagnosis not present

## 2017-11-21 ENCOUNTER — Telehealth: Payer: Self-pay | Admitting: Cardiology

## 2017-11-21 NOTE — Telephone Encounter (Signed)
° ° °  Pt c/o medication issue:  1. Name of Medication: diltiazem (CARDIZEM CD) 120 MG 24 hr capsule  2. How are you currently taking this medication (dosage and times per day)? n/a  3. Are you having a reaction (difficulty breathing--STAT)?    4. What is your medication issue? Patient calling to report she has not been taking medication since 11/10/17

## 2017-11-22 NOTE — Telephone Encounter (Signed)
Pt reports that she took the medication (Dilitazem) for 5 days and she did not like the way it made her feel.  Reports dizziness and "funny feeling". She proceeds to tell me she was then admitted for PNA w/ fever/chills around same time she complained of Diltiazem SE.  She also reported "being on the verge of an AFib attack" during PNA adx.  Pt tried to report that she stopped the medication d/t fever/chills and not feeling well.  I informed her that Diltiazem was not the cause of fever/chills, that symptoms were r/t PNA. She then stated that the medication was dehydrating her.  She thought it was a diuretic.  I informed her that this was not a diuretic and not the cause of her dehydration (most likely r/t PNA). Pt would like to restart Toprol b/c she would rather feel fatigued than the way she felt on Dilitazem.   Advised to restart at previous dose and not restart Diltiazem.   Dr. Curt Bears - pt taking Toprol 25 mg BID.  Please review chart and see if you feel she can decrease to once daily to see if she has less fatigue but stay HR controlled  Will forward to Dr. Curt Bears for advisement.  Pt aware I will call her next week with his recommendation.

## 2017-11-26 NOTE — Telephone Encounter (Signed)
Attempted to reach patient - no answer/no voicemail

## 2017-11-29 DIAGNOSIS — C674 Malignant neoplasm of posterior wall of bladder: Secondary | ICD-10-CM | POA: Diagnosis not present

## 2017-11-29 DIAGNOSIS — N3020 Other chronic cystitis without hematuria: Secondary | ICD-10-CM | POA: Diagnosis not present

## 2017-12-05 DIAGNOSIS — I4891 Unspecified atrial fibrillation: Secondary | ICD-10-CM | POA: Diagnosis not present

## 2017-12-11 MED ORDER — METOPROLOL SUCCINATE ER 25 MG PO TB24: 25.0000 mg | ORAL_TABLET | Freq: Two times a day (BID) | ORAL | 3 refills | Status: DC

## 2017-12-11 NOTE — Telephone Encounter (Signed)
Finally spoke with pt.  She reports doing better.  She currently is doing ok on Toprol 25 mg BID.  She will call the office if this changes.  She understands to call back if "funny feeling" returns and we will talk about changing Toprol to 50 mg at bedtime. Pt is agreeable.  She appreciates my follow up.

## 2017-12-11 NOTE — Addendum Note (Signed)
Addended by: Stanton Kidney on: 12/11/2017 02:07 PM   Modules accepted: Orders

## 2017-12-27 ENCOUNTER — Other Ambulatory Visit: Payer: Self-pay | Admitting: Cardiovascular Disease

## 2017-12-27 NOTE — Telephone Encounter (Signed)
Rx request sent to pharmacy.  

## 2018-01-14 DIAGNOSIS — Z720 Tobacco use: Secondary | ICD-10-CM | POA: Diagnosis not present

## 2018-01-14 DIAGNOSIS — Z87891 Personal history of nicotine dependence: Secondary | ICD-10-CM | POA: Diagnosis not present

## 2018-01-14 DIAGNOSIS — I10 Essential (primary) hypertension: Secondary | ICD-10-CM | POA: Diagnosis not present

## 2018-01-14 DIAGNOSIS — Z23 Encounter for immunization: Secondary | ICD-10-CM | POA: Diagnosis not present

## 2018-01-26 ENCOUNTER — Other Ambulatory Visit: Payer: Self-pay | Admitting: Cardiovascular Disease

## 2018-01-29 ENCOUNTER — Other Ambulatory Visit: Payer: Self-pay | Admitting: *Deleted

## 2018-01-29 MED ORDER — FLECAINIDE ACETATE 100 MG PO TABS: 100.0000 mg | ORAL_TABLET | Freq: Two times a day (BID) | ORAL | 0 refills | Status: DC

## 2018-01-29 MED ORDER — ATORVASTATIN CALCIUM 80 MG PO TABS: 80.0000 mg | ORAL_TABLET | Freq: Every day | ORAL | 0 refills | Status: DC

## 2018-02-10 ENCOUNTER — Other Ambulatory Visit: Payer: Self-pay

## 2018-03-04 DIAGNOSIS — M7542 Impingement syndrome of left shoulder: Secondary | ICD-10-CM | POA: Diagnosis not present

## 2018-03-04 DIAGNOSIS — M25512 Pain in left shoulder: Secondary | ICD-10-CM | POA: Diagnosis not present

## 2018-04-08 DIAGNOSIS — J441 Chronic obstructive pulmonary disease with (acute) exacerbation: Secondary | ICD-10-CM | POA: Diagnosis not present

## 2018-04-24 DIAGNOSIS — J449 Chronic obstructive pulmonary disease, unspecified: Secondary | ICD-10-CM | POA: Diagnosis not present

## 2018-04-24 DIAGNOSIS — Z720 Tobacco use: Secondary | ICD-10-CM | POA: Diagnosis not present

## 2018-04-24 DIAGNOSIS — R918 Other nonspecific abnormal finding of lung field: Secondary | ICD-10-CM | POA: Diagnosis not present

## 2018-04-24 DIAGNOSIS — I10 Essential (primary) hypertension: Secondary | ICD-10-CM | POA: Diagnosis not present

## 2018-04-24 DIAGNOSIS — M150 Primary generalized (osteo)arthritis: Secondary | ICD-10-CM | POA: Diagnosis not present

## 2018-04-24 DIAGNOSIS — Z87891 Personal history of nicotine dependence: Secondary | ICD-10-CM | POA: Diagnosis not present

## 2018-04-24 DIAGNOSIS — G4733 Obstructive sleep apnea (adult) (pediatric): Secondary | ICD-10-CM | POA: Diagnosis not present

## 2018-04-24 DIAGNOSIS — Z6829 Body mass index (BMI) 29.0-29.9, adult: Secondary | ICD-10-CM | POA: Diagnosis not present

## 2018-04-24 DIAGNOSIS — I4891 Unspecified atrial fibrillation: Secondary | ICD-10-CM | POA: Diagnosis not present

## 2018-04-24 DIAGNOSIS — Z1389 Encounter for screening for other disorder: Secondary | ICD-10-CM | POA: Diagnosis not present

## 2018-05-01 DIAGNOSIS — Z1231 Encounter for screening mammogram for malignant neoplasm of breast: Secondary | ICD-10-CM | POA: Diagnosis not present

## 2018-05-15 ENCOUNTER — Other Ambulatory Visit: Payer: Self-pay | Admitting: Cardiology

## 2018-07-25 DIAGNOSIS — I4891 Unspecified atrial fibrillation: Secondary | ICD-10-CM | POA: Diagnosis not present

## 2018-07-25 DIAGNOSIS — I10 Essential (primary) hypertension: Secondary | ICD-10-CM | POA: Diagnosis not present

## 2018-08-04 ENCOUNTER — Other Ambulatory Visit: Payer: Self-pay | Admitting: Cardiovascular Disease

## 2018-08-26 DIAGNOSIS — M25511 Pain in right shoulder: Secondary | ICD-10-CM | POA: Diagnosis not present

## 2018-10-27 DIAGNOSIS — J302 Other seasonal allergic rhinitis: Secondary | ICD-10-CM | POA: Diagnosis not present

## 2018-10-27 DIAGNOSIS — I4891 Unspecified atrial fibrillation: Secondary | ICD-10-CM | POA: Diagnosis not present

## 2018-10-27 DIAGNOSIS — J40 Bronchitis, not specified as acute or chronic: Secondary | ICD-10-CM | POA: Diagnosis not present

## 2018-11-08 DIAGNOSIS — J111 Influenza due to unidentified influenza virus with other respiratory manifestations: Secondary | ICD-10-CM | POA: Diagnosis not present

## 2018-11-08 DIAGNOSIS — R05 Cough: Secondary | ICD-10-CM | POA: Diagnosis not present

## 2018-11-08 DIAGNOSIS — Z20828 Contact with and (suspected) exposure to other viral communicable diseases: Secondary | ICD-10-CM | POA: Diagnosis not present

## 2019-01-26 DIAGNOSIS — Z23 Encounter for immunization: Secondary | ICD-10-CM | POA: Diagnosis not present

## 2019-01-26 DIAGNOSIS — I10 Essential (primary) hypertension: Secondary | ICD-10-CM | POA: Diagnosis not present

## 2019-04-30 DIAGNOSIS — N183 Chronic kidney disease, stage 3 unspecified: Secondary | ICD-10-CM | POA: Diagnosis not present

## 2019-04-30 DIAGNOSIS — M8580 Other specified disorders of bone density and structure, unspecified site: Secondary | ICD-10-CM | POA: Diagnosis not present

## 2019-04-30 DIAGNOSIS — Z6828 Body mass index (BMI) 28.0-28.9, adult: Secondary | ICD-10-CM | POA: Diagnosis not present

## 2019-04-30 DIAGNOSIS — I10 Essential (primary) hypertension: Secondary | ICD-10-CM | POA: Diagnosis not present

## 2019-04-30 DIAGNOSIS — Z1389 Encounter for screening for other disorder: Secondary | ICD-10-CM | POA: Diagnosis not present

## 2019-04-30 DIAGNOSIS — Z87898 Personal history of other specified conditions: Secondary | ICD-10-CM | POA: Diagnosis not present

## 2019-04-30 DIAGNOSIS — J449 Chronic obstructive pulmonary disease, unspecified: Secondary | ICD-10-CM | POA: Diagnosis not present

## 2019-04-30 DIAGNOSIS — Z87891 Personal history of nicotine dependence: Secondary | ICD-10-CM | POA: Diagnosis not present

## 2019-04-30 DIAGNOSIS — G4733 Obstructive sleep apnea (adult) (pediatric): Secondary | ICD-10-CM | POA: Diagnosis not present

## 2019-04-30 DIAGNOSIS — I4891 Unspecified atrial fibrillation: Secondary | ICD-10-CM | POA: Diagnosis not present

## 2019-04-30 DIAGNOSIS — M8949 Other hypertrophic osteoarthropathy, multiple sites: Secondary | ICD-10-CM | POA: Diagnosis not present

## 2019-04-30 DIAGNOSIS — Z720 Tobacco use: Secondary | ICD-10-CM | POA: Diagnosis not present

## 2019-05-11 DIAGNOSIS — N959 Unspecified menopausal and perimenopausal disorder: Secondary | ICD-10-CM | POA: Diagnosis not present

## 2019-05-11 DIAGNOSIS — M8589 Other specified disorders of bone density and structure, multiple sites: Secondary | ICD-10-CM | POA: Diagnosis not present

## 2019-05-11 DIAGNOSIS — M85851 Other specified disorders of bone density and structure, right thigh: Secondary | ICD-10-CM | POA: Diagnosis not present

## 2019-06-10 ENCOUNTER — Telehealth (INDEPENDENT_AMBULATORY_CARE_PROVIDER_SITE_OTHER): Payer: Medicare Other | Admitting: Cardiology

## 2019-06-10 ENCOUNTER — Encounter: Payer: Self-pay | Admitting: Cardiology

## 2019-06-10 VITALS — BP 160/71 | HR 64 | Ht 63.5 in | Wt 162.0 lb

## 2019-06-10 DIAGNOSIS — I480 Paroxysmal atrial fibrillation: Secondary | ICD-10-CM | POA: Diagnosis not present

## 2019-06-10 DIAGNOSIS — Z7901 Long term (current) use of anticoagulants: Secondary | ICD-10-CM | POA: Insufficient documentation

## 2019-06-10 DIAGNOSIS — N289 Disorder of kidney and ureter, unspecified: Secondary | ICD-10-CM

## 2019-06-10 DIAGNOSIS — Z9989 Dependence on other enabling machines and devices: Secondary | ICD-10-CM

## 2019-06-10 DIAGNOSIS — I248 Other forms of acute ischemic heart disease: Secondary | ICD-10-CM

## 2019-06-10 DIAGNOSIS — I10 Essential (primary) hypertension: Secondary | ICD-10-CM

## 2019-06-10 DIAGNOSIS — G4733 Obstructive sleep apnea (adult) (pediatric): Secondary | ICD-10-CM

## 2019-06-10 DIAGNOSIS — J449 Chronic obstructive pulmonary disease, unspecified: Secondary | ICD-10-CM

## 2019-06-10 HISTORY — DX: Disorder of kidney and ureter, unspecified: N28.9

## 2019-06-10 HISTORY — DX: Long term (current) use of anticoagulants: Z79.01

## 2019-06-10 NOTE — Patient Instructions (Signed)
Medication Instructions:  Your physician recommends that you continue on your current medications as directed. Please refer to the Current Medication list given to you today.  *If you need a refill on your cardiac medications before your next appointment, please call your pharmacy*   Follow-Up: At [facility], you and your health needs are our priority.  As part of our continuing mission to provide you with exceptional heart care, we have created designated Provider Care Teams.  These Care Teams include your primary Cardiologist (physician) and Advanced Practice Providers (APPs -  Physician Assistants and Nurse Practitioners) who all work together to provide you with the care you need, when you need it.  We recommend signing up for the patient portal called "MyChart".  Sign up information is provided on this After Visit Summary.  MyChart is used to connect with patients for Virtual Visits (Telemedicine).  Patients are able to view lab/test results, encounter notes, upcoming appointments, etc.  Non-urgent messages can be sent to your provider as well.   To learn more about what you can do with MyChart, go to [email or web address].    Your next appointment:   3-4 week(s)  The format for your next appointment:   In Person  Provider:   You may see Skeet Latch, MD or one of the following Advanced Practice Providers on your designated Care Team:    Kerin Ransom, PA-C  [place], [place]  Coletta Memos, [place]    Other Instructions Our office will call you to schedule this appointment.

## 2019-06-10 NOTE — Progress Notes (Signed)
Virtual Visit via Telephone Note   This visit type was conducted due to national recommendations for restrictions regarding the COVID-19 Pandemic (e.g. social distancing) in an effort to limit this patient's exposure and mitigate transmission in our community.  Due to her co-morbid illnesses, this patient is at least at moderate risk for complications without adequate follow up.  This format is felt to be most appropriate for this patient at this time.  The patient did not have access to video technology/had technical difficulties with video requiring transitioning to audio format only (telephone).  All issues noted in this document were discussed and addressed.  No physical exam could be performed with this format.  Please refer to the patient's chart for her  consent to telehealth for [facility].   The patient was identified using 2 identifiers.  Date:  06/10/2019   ID:  Dana Horton, DOB 1945/05/29, MRN [id number]  Patient Location: Home Provider Location: Home  PCP:  Townsend Roger, MD  Cardiologist:  Skeet Latch, MD  Electrophysiologist:  None   Evaluation Performed:  Follow-Up Visit  Chief Complaint:  Increased B/P, LE edema after recent medication change  History of Present Illness:    Dana Horton is a pleasnt 74 y.o. female from [place] with a history of PAF.  She was last admitted in January 2019 with AF with RVR and elevated troponin and some chest discomfort.  She was converted chemically then and her flecainide dose was increased to 100 mg twice daily.  Catheterization done that admission showed normal coronaries and normal LV function by echo.  She is on Eliquis.  Her last office visit was in August 2019.  At that time Dr. Curt Bears took her off metoprolol and put her on diltiazem secondary to fatigue.  In reviewing her medications she is no longer on diltiazem and is back on metoprolol.  Patient was contacted today for follow-up.   She tells me she is done well from a heart rhythm standpoint, she is not had tachycardia or fluttering.  She did tell me her family doctor recently took her off hydrochlorothiazide and potassium because of renal insufficiency.  Since then the patient has noted that her blood pressure is drifted up and she is also noted some lower extremity edema.  She tells me her blood pressure is 160/70, heart rate 64.   The patient does not have symptoms concerning for COVID-19 infection (fever, chills, cough, or new shortness of breath).    Past Medical History:  Diagnosis Date  . Allergy   . Arthritis, rheumatoid ([place])   . Atrial fibrillation ([place])   . Cancer ([place])    bladder  . COPD (chronic obstructive pulmonary disease) ([place])   . Essential hypertension   . Heart murmur   . Lung nodule   . Myocardial infarction [facility])    ? small with atrial fib   . Osteoporosis   . Pneumonia    hx of   . Seasonal allergies   . Sleep apnea    cpap  setting at 2.5 per patient   . Tobacco abuse    Past Surgical History:  Procedure Laterality Date  . ABDOMINAL HYSTERECTOMY    . BLADDER SURGERY    . COLONOSCOPY N/A 12/28/2016   Procedure: COLONOSCOPY;  Surgeon: Doran Stabler, MD;  Location: [facility];  Service: Gastroenterology;  Laterality: N/A;  . LEFT HEART CATH AND CORONARY ANGIOGRAPHY N/A 04/09/2017   Procedure: LEFT HEART CATH AND CORONARY ANGIOGRAPHY;  Surgeon: Jettie Booze, MD;  Location: [facility];  Service: Cardiovascular;  Laterality: N/A;  . MOUTH SURGERY    . POLYPECTOMY N/A 12/28/2016   Procedure: POLYPECTOMY with injection of Elevue;  Surgeon: Doran Stabler, MD;  Location: [facility];  Service: Gastroenterology;  Laterality: N/A;     Current Meds  Medication Sig  . atorvastatin (LIPITOR) 80 MG tablet Take 1 tablet (80 mg total) by mouth daily at 6 PM.  . ELIQUIS 5 MG TABS tablet TAKE 1 TABLET BY MOUTH TWICE DAILY  . flecainide (TAMBOCOR) 100 MG tablet Take 1  tablet (100 mg total) by mouth 2 (two) times daily. NEEDS F/U W/DR [place]  . fluticasone (FLONASE) 50 MCG/ACT nasal spray Place 2 sprays into both nostrils daily.  . furosemide (LASIX) 20 MG tablet TAKE 1 TABLET BY MOUTH DAILY IF NEEDED FOR SHORTNESS OF BREATH OR LOWER EXTEMITY EDEMA  . metoprolol succinate (TOPROL-XL) 25 MG 24 hr tablet Take 1 tablet (25 mg total) by mouth 2 (two) times daily. Take with or immediately following a meal.  . SYMBICORT 160-4.5 MCG/ACT inhaler Take 2 puffs by mouth 2 (two) times daily.     Allergies:   Albuterol and Lisinopril   Social History   Tobacco Use  . Smoking status: Former Smoker    Packs/day: 0.50    Years: 52.00    Pack years: 26.00    Quit date: 03/26/2017    Years since quitting: 2.2  . Smokeless tobacco: Never Used  . Tobacco comment: 1/2 pack a day  Substance Use Topics  . Alcohol use: No  . Drug use: No     Family Hx: The patient's family history includes Arrhythmia in her father; Bladder Cancer in her brother; CAD in her father; Cancer in her father; Heart disease in her father; Hypertension in her mother; Lung cancer in her brother; Stroke in her mother.  ROS:   Please see the history of present illness.    All other systems reviewed and are negative.   Prior CV studies:   The following studies were reviewed today: Cath Jan 2019   Labs/Other Tests and Data Reviewed:    EKG:  An ECG dated 11/05/2017 was personally reviewed today and demonstrated:  NSR-HR 66, Qtc by computer was 505 but this was overread by Dr Curt Bears as normal  Recent Labs: No results found for requested labs within last 8760 hours.   Recent Lipid Panel No results found for: CHOL, TRIG, HDL, CHOLHDL, LDLCALC, LDLDIRECT  Wt Readings from Last 3 Encounters:  06/10/19 162 lb (73.5 kg)  11/05/17 169 lb 9.6 oz (76.9 kg)  05/01/17 160 lb 3.2 oz (72.7 kg)     Objective:    Vital Signs:  BP (!) 160/71   Pulse 64   Ht 5' 3.5" (1.613 m)   Wt 162 lb (73.5  kg)   LMP  (LMP Unknown)   BMI 28.25 kg/m    VITAL SIGNS:  reviewed  ASSESSMENT & PLAN:    PAF- She apparently has held NSR on Flecainide 100 mg BID-LOV Aug 2019  Chronic anticoagulation- CHADS VASC=3, on Eliquis  HTN- Medications recently adjusted by her PCP- I do not have access to those records but will request them.  I suggested she contact her PCP and let them know her B/P has drifted up and she is having LE edema.    COPD- Asthmatic component- she uses inhalers.   Plan: I explained that she needs to be  seen yearly at minimum for an EKG on Flecainide.  I will arrange for a f/u in the office and in the meantime I will request records and lab results from her PCP.  COVID-19 Education: The signs and symptoms of COVID-19 were discussed with the patient and how to seek care for testing (follow up with PCP or arrange E-visit).  The importance of social distancing was discussed today.  Time:   Today, I have spent 20 minutes with the patient with telehealth technology discussing the above problems.     Medication Adjustments/Labs and Tests Ordered: Current medicines are reviewed at length with the patient today.  Concerns regarding medicines are outlined above.   Tests Ordered: No orders of the defined types were placed in this encounter.   Medication Changes: No orders of the defined types were placed in this encounter.   Follow Up:  In Person Dr Oval Linsey or an APP in 3-4 weeks- she will need an EKG.  Please request recent lab results and office note from her PCP.   Signed, Kerin Ransom, PA-C  06/10/2019 2:35 PM    [facility]

## 2019-06-16 DIAGNOSIS — I10 Essential (primary) hypertension: Secondary | ICD-10-CM | POA: Diagnosis not present

## 2019-06-16 DIAGNOSIS — L989 Disorder of the skin and subcutaneous tissue, unspecified: Secondary | ICD-10-CM | POA: Diagnosis not present

## 2019-06-25 DIAGNOSIS — C44622 Squamous cell carcinoma of skin of right upper limb, including shoulder: Secondary | ICD-10-CM | POA: Diagnosis not present

## 2019-06-25 DIAGNOSIS — R233 Spontaneous ecchymoses: Secondary | ICD-10-CM | POA: Diagnosis not present

## 2019-06-25 DIAGNOSIS — C44629 Squamous cell carcinoma of skin of left upper limb, including shoulder: Secondary | ICD-10-CM | POA: Diagnosis not present

## 2019-07-08 ENCOUNTER — Encounter: Payer: Self-pay | Admitting: Cardiovascular Disease

## 2019-07-08 ENCOUNTER — Ambulatory Visit (INDEPENDENT_AMBULATORY_CARE_PROVIDER_SITE_OTHER): Payer: Medicare Other | Admitting: Cardiovascular Disease

## 2019-07-08 ENCOUNTER — Other Ambulatory Visit: Payer: Self-pay

## 2019-07-08 VITALS — BP 196/92 | HR 56 | Ht 64.0 in | Wt 161.0 lb

## 2019-07-08 DIAGNOSIS — I480 Paroxysmal atrial fibrillation: Secondary | ICD-10-CM | POA: Diagnosis not present

## 2019-07-08 DIAGNOSIS — I5033 Acute on chronic diastolic (congestive) heart failure: Secondary | ICD-10-CM

## 2019-07-08 DIAGNOSIS — I10 Essential (primary) hypertension: Secondary | ICD-10-CM

## 2019-07-08 DIAGNOSIS — Z5181 Encounter for therapeutic drug level monitoring: Secondary | ICD-10-CM

## 2019-07-08 DIAGNOSIS — I248 Other forms of acute ischemic heart disease: Secondary | ICD-10-CM

## 2019-07-08 MED ORDER — VALSARTAN 160 MG PO TABS: 160.0000 mg | ORAL_TABLET | Freq: Every day | ORAL | 1 refills | Status: DC

## 2019-07-08 MED ORDER — AMLODIPINE BESYLATE 5 MG PO TABS: 5.0000 mg | ORAL_TABLET | Freq: Every day | ORAL | 3 refills | Status: DC

## 2019-07-08 NOTE — Patient Instructions (Addendum)
Medication Instructions:  STOP LOSARTAN   START VALSARTAN 160 MG DAILY   START AMLODIPINE 5 MG DAILY   *If you need a refill on your cardiac medications before your next appointment, please call your pharmacy*  Lab Work: BMET IN 1 WEEK   If you have labs (blood work) drawn today and your tests are completely normal, you will receive your results only by: Marland Kitchen MyChart Message (if you have MyChart) OR . A paper copy in the mail If you have any lab test that is abnormal or we need to change your treatment, we will call you to review the results.  Testing/Procedures: NONE  Follow-Up: At [facility], you and your health needs are our priority.  As part of our continuing mission to provide you with exceptional heart care, we have created designated Provider Care Teams.  These Care Teams include your primary Cardiologist (physician) and Advanced Practice Providers (APPs -  Physician Assistants and Nurse Practitioners) who all work together to provide you with the care you need, when you need it.  We recommend signing up for the patient portal called "MyChart".  Sign up information is provided on this After Visit Summary.  MyChart is used to connect with patients for Virtual Visits (Telemedicine).  Patients are able to view lab/test results, encounter notes, upcoming appointments, etc.  Non-urgent messages can be sent to your provider as well.   To learn more about what you can do with MyChart, go to [email or web address].    Your next appointment:   3 month(s)  You will receive a reminder letter in the mail two months in advance. If you don't receive a letter, please call our office to schedule the follow-up appointment.   The format for your next appointment:   In Person  Provider:   You may see Skeet Latch, MD or one of the following Advanced Practice Providers on your designated Care Team:    Kerin Ransom, PA-C  [place], [place]  Coletta Memos, [place]  Your physician  recommends that you schedule a follow-up appointment in: PHARM D IN 2 WEEKS FOR BLOOD PRESSURE   Other Instructions  GO TO [place]

## 2019-07-08 NOTE — Progress Notes (Signed)
Cardiology Office Note   Date:  07/08/2019   ID:  Dana Horton, DOB 08-22-1945, MRN [id number]  PCP:  Townsend Roger, MD  Cardiologist:   Skeet Latch, MD   No chief complaint on file.   History of Present Illness: Dana Horton is a 74 y.o. female with paroxysmal atrial fibrillation, hypertension, COPD and OSA who presents for follow-up.  She was initially seen 10/2016 for an evaluation of chest pain.  Dana Horton saw Dr. Vassie Loll Eyk on 6/21 and reported chest pain.  At that appointment she was noted to be in atrial fibrillation with a ventricular rate of 140 bpm.  She was referred to [facility] where she reportedly converted back to sinus rhythm when her IV was inserted.  Echo that admission revealed LVEF 60-65% with mild mitral and tricuspid regurgitation.  She was started on metoprolol and they recommended that she take a full strength aspirin. She did this but developed hematochezia. She is always has hematochezia when she tries to take a full strength aspirin or NSAIDS.  She reported fatigue and metoprolol was switched to nightly.  She was also referred to GI. She saw Dr. Loletha Carrow and had an upper endoscopy that was normal.  She also had a colonoscopy that showed 1-2 mm polyp that was removed and another 12 mm polyp that could not be resected as well as a 10 mm polyp in the sigmoid colon for which resection was not attempted.  Her bleeding was thought to be due to to intermittent benign anal bleeding.  She returned for colonoscopy 12/2016 with removal of more polyps.  She was also found to have diverticuli throughout the entire colon.  It was also noted that she had internal hemorrhoids.  It was felt to be safe for her to start anticoagulation 2 weeks following her colonoscopy.  Dana Horton was switched from dronedarone to flecainide due to cost concerns.  At that time she was admitted 03/2017 with atrial fibrillation with rapid  ventricular response.  She was started on IV amiodarone and converted back to rhythm.  Troponin was elevated.  She underwent left heart catheterization that revealed normal coronary arteries.  Flecainide was increased to 100 mg.  She has done well from a atrial fibrillation standpoint.  She saw her PCP and hydrochlorothiazide was discontinued due to acute renal failure.  Her blood pressure became elevated and she was started on losartan 25, which was subsequently increased to 50 mg.  She also noted some lower extremity edema.  She notes that her blood pressure remains elevated.  This morning it was 0000000 systolic.  The lowest it has been at home is 160.  She has been feeling jittery.  She notes that her breathing is okay she denies chest pain.  She is having intermittent headaches.  She had some swelling in her feet but this is slowly improving.  She sleeps on an incline at home.  She denies orthopnea or PND.   Past Medical History:  Diagnosis Date  . Allergy   . Arthritis, rheumatoid ([place])   . Atrial fibrillation ([place])   . Cancer ([place])    bladder  . COPD (chronic obstructive pulmonary disease) ([place])   . Essential hypertension   . Heart murmur   . Lung nodule   . Myocardial infarction [facility])    ? small with atrial fib   . Osteoporosis   . Pneumonia    hx of   . Seasonal allergies   .  Sleep apnea    cpap  setting at 2.5 per patient   . Tobacco abuse     Past Surgical History:  Procedure Laterality Date  . ABDOMINAL HYSTERECTOMY    . BLADDER SURGERY    . COLONOSCOPY N/A 12/28/2016   Procedure: COLONOSCOPY;  Surgeon: Doran Stabler, MD;  Location: [facility];  Service: Gastroenterology;  Laterality: N/A;  . LEFT HEART CATH AND CORONARY ANGIOGRAPHY N/A 04/09/2017   Procedure: LEFT HEART CATH AND CORONARY ANGIOGRAPHY;  Surgeon: Jettie Booze, MD;  Location: [facility];  Service: Cardiovascular;  Laterality: N/A;  . MOUTH SURGERY    . POLYPECTOMY N/A 12/28/2016   Procedure:  POLYPECTOMY with injection of Elevue;  Surgeon: Doran Stabler, MD;  Location: [facility];  Service: Gastroenterology;  Laterality: N/A;      Current Outpatient Medications  Medication Sig Dispense Refill  . atorvastatin (LIPITOR) 80 MG tablet Take 1 tablet (80 mg total) by mouth daily at 6 PM. 90 tablet 0  . ELIQUIS 5 MG TABS tablet TAKE 1 TABLET BY MOUTH TWICE DAILY 60 tablet 5  . flecainide (TAMBOCOR) 100 MG tablet Take 1 tablet (100 mg total) by mouth 2 (two) times daily. NEEDS F/U W/DR [place] 180 tablet 0  . fluticasone (FLONASE) 50 MCG/ACT nasal spray Place 2 sprays into both nostrils daily.    . furosemide (LASIX) 20 MG tablet TAKE 1 TABLET BY MOUTH DAILY IF NEEDED FOR SHORTNESS OF BREATH OR LOWER EXTEMITY EDEMA 90 tablet 3  . metoprolol succinate (TOPROL-XL) 25 MG 24 hr tablet Take 1 tablet (25 mg total) by mouth 2 (two) times daily. Take with or immediately following a meal. 90 tablet 3  . SYMBICORT 160-4.5 MCG/ACT inhaler Take 2 puffs by mouth 2 (two) times daily.  3  . amLODipine (NORVASC) 5 MG tablet Take 1 tablet (5 mg total) by mouth daily. 90 tablet 3  . valsartan (DIOVAN) 160 MG tablet Take 1 tablet (160 mg total) by mouth daily. 90 tablet 1   No current facility-administered medications for this visit.    Allergies:   Albuterol and Lisinopril    Social History:  The patient  reports that she quit smoking about 2 years ago. She has a 26.00 pack-year smoking history. She has never used smokeless tobacco. She reports that she does not drink alcohol or use drugs.   Family History:  The patient's family history includes Arrhythmia in her father; Bladder Cancer in her brother; CAD in her father; Cancer in her father; Heart disease in her father; Hypertension in her mother; Lung cancer in her brother; Stroke in her mother.    ROS:  Please see the history of present illness.   Otherwise, review of systems are positive for none.   All other systems are reviewed and  negative.    PHYSICAL EXAM: VS:  BP (!) 196/92   Pulse (!) 56   Ht 5\' 4"  (1.626 m)   Wt 161 lb (73 kg)   LMP  (LMP Unknown)   SpO2 95%   BMI 27.64 kg/m  , BMI Body mass index is 27.64 kg/m. GENERAL:  Well appearing HEENT: Pupils equal round and reactive, fundi not visualized, oral mucosa unremarkable NECK:  No jugular venous distention, waveform within normal limits, carotid upstroke brisk and symmetric, no bruits, no thyromegaly LYMPHATICS:  No cervical adenopathy LUNGS:  Clear to auscultation bilaterally HEART:  RRR.  PMI not displaced or sustained,S1 and S2 within normal limits, no S3,  no S4, no clicks, no rubs, III/VI holosystolic murmur at the apex with radiation anteriorly ABD:  Flat, positive bowel sounds normal in frequency in pitch, no bruits, no rebound, no guarding, no midline pulsatile mass, no hepatomegaly, no splenomegaly EXT:  2 plus pulses throughout, no edema, no cyanosis no clubbing SKIN:  No rashes no nodules NEURO:  Cranial nerves II through XII grossly intact, motor grossly intact throughout PSYCH:  Cognitively intact, oriented to person place and time   EKG:  EKG is ordered today. The ekg ordered 10/24/16 demonstrates sinus bradycardia. Rate 53 bpm. 07/08/19: Sinus bradycardia.  Rate 56 bpm.  Prior septal infarct.   Echo 09/14/16: LVEF 60-65%.  Abnormal diastolic function.  Mild mitral regurgitation and tricuspid regurgitation.  PASP 31 mmHg.  Recent Labs: No results found for requested labs within last 8760 hours.   03/14/16: WBC 8.1, hemoglobin 13.2, hematocrit 38.9, platelets 195 Sodium 143, potassium 3.6, BUN 18, creatinine 0.88 AST 14, ALT 8 Chol 178, triglycerides 199, HDL 33, LDL 102 TSH 0.83  LHC 04/09/17:  The left ventricular systolic function is normal.  LV end diastolic pressure is normal. LVEDP 16 mm Hg.  The left ventricular ejection fraction is greater than 65% by visual estimate.  There is no aortic valve stenosis.  No  angiographically apparent CAD.  Lipid Panel No results found for: CHOL, TRIG, HDL, CHOLHDL, VLDL, LDLCALC, LDLDIRECT    Wt Readings from Last 3 Encounters:  07/08/19 161 lb (73 kg)  06/10/19 162 lb (73.5 kg)  11/05/17 169 lb 9.6 oz (76.9 kg)      ASSESSMENT AND PLAN:  # Paroxysmal atrial fibrillation:  Dana Horton had recurrent atrial fibrillation in the setting of pneumonia.  Flecainide was increased.  She has been doing well and has no recurrence since discharge.  Continue flecainide, metoprolol, and Eliquis.   This patients CHA2DS2-VASc Score and unadjusted Ischemic Stroke Rate (% per year) is equal to 3.2 % stroke rate/year from a score of 3  Above score calculated as 1 point each if present [CHF, HTN, DM, Vascular=MI/PAD/Aortic Plaque, Age if 65-74, or Female] Above score calculated as 2 points each if present [Age > 75, or Stroke/TIA/TE]  # Hypertension: Blood pressure is now elevated after stopping HCTZ.  Switch losartan to valsartan 160mg .  Check BMP in 1 week.  Continue metoprolol and add amlodipine 5mg  daily.  Continue to track BP and bring to follow up.   # Tobacco abuse: Patient congratulated on cessation.   Current medicines are reviewed at length with the patient today.  The patient does not have concerns regarding medicines.  The following changes have been made: stop losartan.  Start valsartan and amlodipine  Labs/ tests ordered today include:   Orders Placed This Encounter  Procedures  . Basic metabolic panel  . EKG 12-Lead      Disposition:   FU with Avyukth Bontempo C. Oval Linsey, MD, [facility] in 6 months    This note was written with the assistance of speech recognition software.  Please excuse any transcriptional errors.  Signed, Rosely Fernandez C. Oval Linsey, MD, [facility]  07/08/2019 5:57 PM    [place]

## 2019-07-16 ENCOUNTER — Telehealth: Payer: Self-pay | Admitting: Cardiovascular Disease

## 2019-07-16 NOTE — Telephone Encounter (Signed)
   Went to chart to check who called pt

## 2019-07-18 NOTE — Progress Notes (Deleted)
Patient ID: Dana Horton                 DOB: [birth year]                      MRN: [id number]     HPI: Dana Horton is a 74 y.o. female referred by Dr. Oval Linsey to HTN clinic. PMH is significant for PAF (Flecainide 100 mg twice daily; Eliquis 5 mg twice daily - appropriate dose as of 07/18/19), HTN, demand ichemia, hx of NSTEMI, diastolic CHF, OSA, COPD, and precordial pain. Cardiac cath (03/2017) showed LVEF > 65%.  At last appt with Dr. Oval Linsey on 07/08/19 patient's BP was 196/92 mmHg and pulse 56 bpm. Patient reported the lowest systolic home BP reading was 160 mmHg. Dr. Oval Linsey changed losartan 50 mg daily to valsartan 160 mg daily. She added amlodipine 5 mg daily. Metoprolol XL 25 mg daily was continued.   Patient presents for initial appt with HTN clinic.  when to initiate entresto in diastolic HF? Can you initiate in LVEF > 65%?  Current HTN meds: valsartan 160 mg daily, amlodipine 5 mg daily, metoprolol XL 25 mg daily, Lasix 20 mg prn Previously tried: lisinopril (cough), HCTZ (acute renal failure) BP goal: <130/80 mmHg  Family History: Arrhythmia in her father; Bladder Cancer in her brother; CAD in her father; Cancer in her father; Heart disease in her father; Hypertension in her mother; Lung cancer in her brother; Stroke in her mother.  Social History: The patient  reports that she quit smoking about 2 years ago. She has a 26.00 pack-year smoking history. She has never used smokeless tobacco. She reports that she does not drink alcohol or use drugs.   Diet:   Exercise:   Home BP readings:   Wt Readings from Last 3 Encounters:  07/08/19 161 lb (73 kg)  06/10/19 162 lb (73.5 kg)  11/05/17 169 lb 9.6 oz (76.9 kg)   BP Readings from Last 3 Encounters:  07/08/19 (!) 196/92  06/10/19 (!) 160/71  11/05/17 128/72   Pulse Readings from Last 3 Encounters:  07/08/19 (!) 56  06/10/19 64  11/05/17 66    Renal function: CrCl cannot be  calculated (Patient's most recent lab result is older than the maximum 21 days allowed.).  Past Medical History:  Diagnosis Date  . Allergy   . Arthritis, rheumatoid ([place])   . Atrial fibrillation ([place])   . Cancer ([place])    bladder  . COPD (chronic obstructive pulmonary disease) ([place])   . Essential hypertension   . Heart murmur   . Lung nodule   . Myocardial infarction [facility])    ? small with atrial fib   . Osteoporosis   . Pneumonia    hx of   . Seasonal allergies   . Sleep apnea    cpap  setting at 2.5 per patient   . Tobacco abuse     Current Outpatient Medications on File Prior to Visit  Medication Sig Dispense Refill  . amLODipine (NORVASC) 5 MG tablet Take 1 tablet (5 mg total) by mouth daily. 90 tablet 3  . atorvastatin (LIPITOR) 80 MG tablet Take 1 tablet (80 mg total) by mouth daily at 6 PM. 90 tablet 0  . ELIQUIS 5 MG TABS tablet TAKE 1 TABLET BY MOUTH TWICE DAILY 60 tablet 5  . flecainide (TAMBOCOR) 100 MG tablet Take 1 tablet (100 mg total) by mouth 2 (two) times daily. NEEDS F/U W/DR [place] 180  tablet 0  . fluticasone (FLONASE) 50 MCG/ACT nasal spray Place 2 sprays into both nostrils daily.    . furosemide (LASIX) 20 MG tablet TAKE 1 TABLET BY MOUTH DAILY IF NEEDED FOR SHORTNESS OF BREATH OR LOWER EXTEMITY EDEMA 90 tablet 3  . metoprolol succinate (TOPROL-XL) 25 MG 24 hr tablet Take 1 tablet (25 mg total) by mouth 2 (two) times daily. Take with or immediately following a meal. 90 tablet 3  . SYMBICORT 160-4.5 MCG/ACT inhaler Take 2 puffs by mouth 2 (two) times daily.  3  . valsartan (DIOVAN) 160 MG tablet Take 1 tablet (160 mg total) by mouth daily. 90 tablet 1   No current facility-administered medications on file prior to visit.    Allergies  Allergen Reactions  . Albuterol Palpitations  . Lisinopril Cough     Assessment/Plan:  1. Hypertension - BP goal<130/80 mmHg, therefore, patient's BP is .  Future Considerations -Increase valsartan from 160 mg  daily to 320 mg daily -Increase amlodipine from 5 mg daily to 10 mg daily -Switch metoprolol 25 mg daily to carvedilol 3.125 mg twice daily (monitor HR) -Initiate chlorthalidone 12.5 mg daily  Thank you for involving pharmacy to assist in providing this patient's care.   Drexel Iha, PharmD PGY2 Ambulatory Care Pharmacy Resident

## 2019-07-21 ENCOUNTER — Other Ambulatory Visit: Payer: Medicare Other

## 2019-07-21 ENCOUNTER — Ambulatory Visit (INDEPENDENT_AMBULATORY_CARE_PROVIDER_SITE_OTHER): Payer: Medicare Other | Admitting: Pharmacist Clinician (PhC)/ Clinical Pharmacy Specialist

## 2019-07-21 ENCOUNTER — Other Ambulatory Visit: Payer: Self-pay

## 2019-07-21 DIAGNOSIS — Z5181 Encounter for therapeutic drug level monitoring: Secondary | ICD-10-CM | POA: Diagnosis not present

## 2019-07-21 DIAGNOSIS — I10 Essential (primary) hypertension: Secondary | ICD-10-CM | POA: Diagnosis not present

## 2019-07-21 DIAGNOSIS — I248 Other forms of acute ischemic heart disease: Secondary | ICD-10-CM

## 2019-07-21 DIAGNOSIS — I480 Paroxysmal atrial fibrillation: Secondary | ICD-10-CM | POA: Diagnosis not present

## 2019-07-21 NOTE — Patient Instructions (Addendum)
Return for a a follow up appointment June 1 at 3:30 pm  Your blood pressure today is 150/76  Check your blood pressure at home once or twice daily, and keep record of the readings.  Take your BP meds as follows:  Increase valsartan to 320 mg once daily (take 2 of the 160 mg tablets once daily until gone)  Continue with all other medications  Bring all of your meds, your BP cuff and your record of home blood pressures to your next appointment.  Exercise as you're able, try to walk approximately 30 minutes per day.  Keep salt intake to a minimum, especially watch canned and prepared boxed foods.  Eat more fresh fruits and vegetables and fewer canned items.  Avoid eating in fast food restaurants.    HOW TO TAKE YOUR BLOOD PRESSURE: . Rest 5 minutes before taking your blood pressure. .  Don't smoke or drink caffeinated beverages for at least 30 minutes before. . Take your blood pressure before (not after) you eat. . Sit comfortably with your back supported and both feet on the floor (don't cross your legs). . Elevate your arm to heart level on a table or a desk. . Use the proper sized cuff. It should fit smoothly and snugly around your bare upper arm. There should be enough room to slip a fingertip under the cuff. The bottom edge of the cuff should be 1 inch above the crease of the elbow. . Ideally, take 3 measurements at one sitting and record the average.

## 2019-07-21 NOTE — Progress Notes (Signed)
07/23/2019 Livy Mulka Goris September 06, 1945 [id number]   HPI:  Najmah Gastelum Biffle is a 74 y.o. female patient of Dr Oval Linsey, with a [place] below who presents today for hypertension clinic evaluation.  She was seen by Dr. Oval Linsey earlier in April and found to have a BP of 196/92.  Her losartan was switched to valsartan 160 mg and amlodipine 5 mg daily was also added.    She returns today for follow up.  Patient notes that she feels her BP has improved somewhat.  When it is elevated she notes feeling off balance and out of sors, with increased pressure in her head.  Her home readings have been mostly in the [id number] systolic, although she does admit to a few higher readings.  She states she has not had to use her furosemide in the past 2-3 weeks.    Past Medical History: ASCVD S/p NSTEMI  PAF On Eliquis, metoprolol, flecainide  OSA On CPAP  COPD On Symbicort bid  Renal insufficiency SCr 0.92 CrCl 63.5     Blood Pressure Goal:  130/80  Current Medications: valsartan 160 mg am, amlodipine 5 mg am  Family Hx: dad with heart issues, died at 33 cancer; mother with hypertension, died at 55 after 43rd stroke; has 8 siblings - 3 deceased, none from cardiac issues (2 cancer, 1 breathing issues); 3 children, 1 daughter with hypertension  Social Hx: half ppd smoker, no interest in quitting, no alcohol; coffee and sweet tea daily  Diet: home cooked meals, grow own beef, garden for [employer];   Exercise: just house work 16 acres, husband grows hay and gardens  Home BP readings: no readings with her today, but states mostly between [id number] systolic, with a few readings either higher or lower than those.    Intolerances: lisinopril - cough, atorvastatin - diarrhea  Labs: (today) Na 146, K 3.6, Glu 82, BUN 15, SCr 0.92  Wt Readings from Last 3 Encounters:  07/21/19 162 lb 12.8 oz (73.8 kg)  07/08/19 161 lb (73 kg)  06/10/19 162 lb (73.5 kg)   BP Readings from Last 3  Encounters:  07/21/19 (!) 158/76  07/08/19 (!) 196/92  06/10/19 (!) 160/71   Pulse Readings from Last 3 Encounters:  07/21/19 62  07/08/19 (!) 56  06/10/19 64    Current Outpatient Medications  Medication Sig Dispense Refill  . amLODipine (NORVASC) 5 MG tablet Take 1 tablet (5 mg total) by mouth daily. 90 tablet 3  . atorvastatin (LIPITOR) 80 MG tablet Take 1 tablet (80 mg total) by mouth daily at 6 PM. 90 tablet 0  . Calcium Carbonate-Vitamin D (CALTRATE 600+D PO) Take 1 tablet by mouth daily.    . cetirizine (ZYRTEC) 10 MG tablet Take 10 mg by mouth daily as needed for allergies.    Marland Kitchen ELIQUIS 5 MG TABS tablet TAKE 1 TABLET BY MOUTH TWICE DAILY 60 tablet 5  . flecainide (TAMBOCOR) 100 MG tablet Take 1 tablet (100 mg total) by mouth 2 (two) times daily. NEEDS F/U W/DR [place] 180 tablet 0  . fluticasone (FLONASE) 50 MCG/ACT nasal spray Place 2 sprays into both nostrils daily.    . furosemide (LASIX) 20 MG tablet TAKE 1 TABLET BY MOUTH DAILY IF NEEDED FOR SHORTNESS OF BREATH OR LOWER EXTEMITY EDEMA 90 tablet 3  . metoprolol succinate (TOPROL-XL) 25 MG 24 hr tablet Take 1 tablet (25 mg total) by mouth 2 (two) times daily. Take with or immediately following a meal. 90 tablet 3  .  SYMBICORT 160-4.5 MCG/ACT inhaler Take 2 puffs by mouth 2 (two) times daily.  3  . valsartan (DIOVAN) 320 MG tablet Take 1 tablet (320 mg total) by mouth daily. 90 tablet 3   No current facility-administered medications for this visit.    Allergies  Allergen Reactions  . Albuterol Palpitations  . Lisinopril Cough    Past Medical History:  Diagnosis Date  . Allergy   . Arthritis, rheumatoid ([place])   . Atrial fibrillation ([place])   . Cancer ([place])    bladder  . COPD (chronic obstructive pulmonary disease) ([place])   . Essential hypertension   . Heart murmur   . Lung nodule   . Myocardial infarction [facility])    ? small with atrial fib   . Osteoporosis   . Pneumonia    hx of   . Seasonal allergies   .  Sleep apnea    cpap  setting at 2.5 per patient   . Tobacco abuse     Blood pressure (!) 158/76, pulse 62, height 5\' 4"  (1.626 m), weight 162 lb 12.8 oz (73.8 kg).  Essential hypertension Patient with essential hypertension, now down almost 40 points systolic with the switch to valsartan and addition of amlodipine.  Today we will have her increase the valsartan to 320 mg and continue all other medications.  She is to continue with home BP monitoring and was given instructions on proper technique.  We will see her back in a month to follow up.     Tommy Medal PharmD CPP [facility] [address] [place], [place] [address] [phone]

## 2019-07-22 LAB — BASIC METABOLIC PANEL
BUN/Creatinine Ratio: 16 (ref 12–28)
BUN: 15 mg/dL (ref 8–27)
CO2: 27 mmol/L (ref 20–29)
Calcium: 9.3 mg/dL (ref 8.7–10.3)
Chloride: 106 mmol/L (ref 96–106)
Creatinine, Ser: 0.92 mg/dL (ref 0.57–1.00)
GFR calc Af Amer: 71 mL/min/{1.73_m2} (ref >59)
GFR calc non Af Amer: 62 mL/min/{1.73_m2} (ref >59)
Glucose: 82 mg/dL (ref 65–99)
Potassium: 3.6 mmol/L (ref 3.5–5.2)
Sodium: 146 mmol/L — ABNORMAL HIGH (ref 134–144)

## 2019-07-23 ENCOUNTER — Encounter: Payer: Self-pay | Admitting: Pharmacist Clinician (PhC)/ Clinical Pharmacy Specialist

## 2019-07-23 MED ORDER — VALSARTAN 320 MG PO TABS: 320.0000 mg | ORAL_TABLET | Freq: Every day | ORAL | 3 refills | Status: DC

## 2019-07-23 NOTE — Assessment & Plan Note (Signed)
Patient with essential hypertension, now down almost 40 points systolic with the switch to valsartan and addition of amlodipine.  Today we will have her increase the valsartan to 320 mg and continue all other medications.  She is to continue with home BP monitoring and was given instructions on proper technique.  We will see her back in a month to follow up.

## 2019-07-29 DIAGNOSIS — I10 Essential (primary) hypertension: Secondary | ICD-10-CM | POA: Diagnosis not present

## 2019-07-29 DIAGNOSIS — Z23 Encounter for immunization: Secondary | ICD-10-CM | POA: Diagnosis not present

## 2019-08-03 DIAGNOSIS — C44629 Squamous cell carcinoma of skin of left upper limb, including shoulder: Secondary | ICD-10-CM | POA: Diagnosis not present

## 2019-08-07 DIAGNOSIS — R7309 Other abnormal glucose: Secondary | ICD-10-CM | POA: Diagnosis not present

## 2019-08-20 DIAGNOSIS — L301 Dyshidrosis [pompholyx]: Secondary | ICD-10-CM | POA: Diagnosis not present

## 2019-08-25 ENCOUNTER — Other Ambulatory Visit: Payer: Self-pay

## 2019-08-25 ENCOUNTER — Ambulatory Visit (INDEPENDENT_AMBULATORY_CARE_PROVIDER_SITE_OTHER): Payer: Medicare Other | Admitting: Pharmacist Clinician (PhC)/ Clinical Pharmacy Specialist

## 2019-08-25 DIAGNOSIS — I248 Other forms of acute ischemic heart disease: Secondary | ICD-10-CM

## 2019-08-25 DIAGNOSIS — I10 Essential (primary) hypertension: Secondary | ICD-10-CM | POA: Diagnosis not present

## 2019-08-25 MED ORDER — AMLODIPINE BESYLATE 10 MG PO TABS: 10.0000 mg | ORAL_TABLET | Freq: Every day | ORAL | 3 refills | Status: DC

## 2019-08-25 NOTE — Patient Instructions (Addendum)
Return for a a follow up appointment July 9  Your blood pressure today is 136/64  Check your blood pressure at home daily and keep record of the readings.  Take your BP meds as follows:  Increase amlodipine to 10 mg daily (take 2 of the 5 mg tablets daily until they are gone)  Continue with all other medications  Bring all of your meds, your BP cuff and your record of home blood pressures to your next appointment.  Exercise as you're able, try to walk approximately 30 minutes per day.  Keep salt intake to a minimum, especially watch canned and prepared boxed foods.  Eat more fresh fruits and vegetables and fewer canned items.  Avoid eating in fast food restaurants.    HOW TO TAKE YOUR BLOOD PRESSURE: . Rest 5 minutes before taking your blood pressure. .  Don't smoke or drink caffeinated beverages for at least 30 minutes before. . Take your blood pressure before (not after) you eat. . Sit comfortably with your back supported and both feet on the floor (don't cross your legs). . Elevate your arm to heart level on a table or a desk. . Use the proper sized cuff. It should fit smoothly and snugly around your bare upper arm. There should be enough room to slip a fingertip under the cuff. The bottom edge of the cuff should be 1 inch above the crease of the elbow. . Ideally, take 3 measurements at one sitting and record the average.

## 2019-08-25 NOTE — Progress Notes (Signed)
08/27/2019 Cianne Usui Goeser 1945/04/11 [id number]   HPI:  Dana Horton is a 74 y.o. female patient of Dr Oval Linsey, with a [place] below who presents today for hypertension clinic evaluation.  She was seen by Dr. Oval Linsey earlier in April and found to have a BP of 196/92.  Her losartan was switched to valsartan 160 mg and amlodipine 5 mg daily was also added.  Since then she has had a significant drop in home BP readings and the valsartan was further increased to 320 mg.  She returns today for follow up.    She is doing well today, has no concerns about her BP.  No issues since increasing valsartan dose.  Labs drawn at that time showed SCr stable at 0.92.  Other than a few high readings in the days prior to having dermatology procedure, home readings have been good (see below).    Past Medical History: ASCVD S/p NSTEMI  PAF On Eliquis, metoprolol, flecainide  OSA On CPAP  COPD On Symbicort bid  Renal insufficiency SCr 0.92 CrCl 63.5     Blood Pressure Goal:  130/80  Current Medications: valsartan 320 mg am, amlodipine 5 mg am  Family Hx: dad with heart issues, died at 23 cancer; mother with hypertension, died at 71 after 80rd stroke; has 8 siblings - 3 deceased, none from cardiac issues (2 cancer, 1 breathing issues); 3 children, 1 daughter with hypertension  Social Hx: half ppd smoker, no interest in quitting, no alcohol; coffee and sweet tea daily  Diet: home cooked meals, grow own beef, garden for [employer];   Exercise: just house work 16 acres, husband grows hay and gardens  Home BP readings: has 21 morning readings over past month, average 146/63 and 14 evening readings average 134/62.    Intolerances: lisinopril - cough, atorvastatin - diarrhea  Labs: (today) Na 146, K 3.6, Glu 82, BUN 15, SCr 0.92  Wt Readings from Last 3 Encounters:  07/21/19 162 lb 12.8 oz (73.8 kg)  07/08/19 161 lb (73 kg)  06/10/19 162 lb (73.5 kg)   BP Readings from  Last 3 Encounters:  08/25/19 136/64  07/21/19 (!) 158/76  07/08/19 (!) 196/92   Pulse Readings from Last 3 Encounters:  08/25/19 64  07/21/19 62  07/08/19 (!) 56    Current Outpatient Medications  Medication Sig Dispense Refill  . Calcium Carbonate-Vitamin D (CALTRATE 600+D PO) Take 1 tablet by mouth daily.    . cetirizine (ZYRTEC) 10 MG tablet Take 10 mg by mouth daily as needed for allergies.    Marland Kitchen ELIQUIS 5 MG TABS tablet TAKE 1 TABLET BY MOUTH TWICE DAILY 60 tablet 5  . flecainide (TAMBOCOR) 100 MG tablet Take 1 tablet (100 mg total) by mouth 2 (two) times daily. NEEDS F/U W/DR [place] 180 tablet 0  . fluticasone (FLONASE) 50 MCG/ACT nasal spray Place 2 sprays into both nostrils daily.    . furosemide (LASIX) 20 MG tablet TAKE 1 TABLET BY MOUTH DAILY IF NEEDED FOR SHORTNESS OF BREATH OR LOWER EXTEMITY EDEMA 90 tablet 3  . metoprolol succinate (TOPROL-XL) 25 MG 24 hr tablet Take 1 tablet (25 mg total) by mouth 2 (two) times daily. Take with or immediately following a meal. 90 tablet 3  . SYMBICORT 160-4.5 MCG/ACT inhaler Take 2 puffs by mouth 2 (two) times daily.  3  . valsartan (DIOVAN) 320 MG tablet Take 1 tablet (320 mg total) by mouth daily. 90 tablet 3  . amLODipine (NORVASC) 10  MG tablet Take 1 tablet (10 mg total) by mouth daily. 90 tablet 3   No current facility-administered medications for this visit.    Allergies  Allergen Reactions  . Albuterol Palpitations  . Lisinopril Cough    Past Medical History:  Diagnosis Date  . Allergy   . Arthritis, rheumatoid ([place])   . Atrial fibrillation ([place])   . Cancer ([place])    bladder  . COPD (chronic obstructive pulmonary disease) ([place])   . Essential hypertension   . Heart murmur   . Lung nodule   . Myocardial infarction [facility])    ? small with atrial fib   . Osteoporosis   . Pneumonia    hx of   . Seasonal allergies   . Sleep apnea    cpap  setting at 2.5 per patient   . Tobacco abuse     Blood pressure 136/64, pulse  64.  Essential hypertension Patient with essential hypertension, much improved, but still not to goal.  Will have her increase amlodipine dose from 5 mg to 10 mg daily and continue valsartan 320 mg.  She should continue with regular home BP checks and we will see her again next month for follow up.     Tommy Medal PharmD CPP [facility] [address] [place], [place] [address] [phone]

## 2019-08-26 DIAGNOSIS — Z23 Encounter for immunization: Secondary | ICD-10-CM | POA: Diagnosis not present

## 2019-08-27 NOTE — Assessment & Plan Note (Signed)
Patient with essential hypertension, much improved, but still not to goal.  Will have her increase amlodipine dose from 5 mg to 10 mg daily and continue valsartan 320 mg.  She should continue with regular home BP checks and we will see her again next month for follow up.

## 2019-10-02 ENCOUNTER — Ambulatory Visit: Payer: Medicare Other

## 2019-10-28 ENCOUNTER — Encounter: Payer: Self-pay | Admitting: Cardiovascular Disease

## 2019-10-28 ENCOUNTER — Other Ambulatory Visit: Payer: Self-pay

## 2019-10-28 ENCOUNTER — Ambulatory Visit (INDEPENDENT_AMBULATORY_CARE_PROVIDER_SITE_OTHER): Payer: Medicare Other | Admitting: Cardiovascular Disease

## 2019-10-28 VITALS — BP 130/69 | HR 68 | Temp 96.1°F | Ht 64.0 in | Wt 163.0 lb

## 2019-10-28 DIAGNOSIS — I480 Paroxysmal atrial fibrillation: Secondary | ICD-10-CM | POA: Diagnosis not present

## 2019-10-28 DIAGNOSIS — I10 Essential (primary) hypertension: Secondary | ICD-10-CM

## 2019-10-28 DIAGNOSIS — I248 Other forms of acute ischemic heart disease: Secondary | ICD-10-CM | POA: Diagnosis not present

## 2019-10-28 NOTE — Patient Instructions (Signed)
Medication Instructions:  Your physician recommends that you continue on your current medications as directed. Please refer to the Current Medication list given to you today.  *If you need a refill on your cardiac medications before your next appointment, please call your pharmacy*  Lab Work: NONE  Testing/Procedures: NONE  Follow-Up: At [facility], you and your health needs are our priority.  As part of our continuing mission to provide you with exceptional heart care, we have created designated Provider Care Teams.  These Care Teams include your primary Cardiologist (physician) and Advanced Practice Providers (APPs -  Physician Assistants and Nurse Practitioners) who all work together to provide you with the care you need, when you need it.  We recommend signing up for the patient portal called "MyChart".  Sign up information is provided on this After Visit Summary.  MyChart is used to connect with patients for Virtual Visits (Telemedicine).  Patients are able to view lab/test results, encounter notes, upcoming appointments, etc.  Non-urgent messages can be sent to your provider as well.   To learn more about what you can do with MyChart, go to [email or web address].    Your next appointment:   4 month(s)  The format for your next appointment:   In Person  Provider:   You may see Skeet Latch, MD or one of the following Advanced Practice Providers on your designated Care Team:    Kerin Ransom, PA-C  [place], [place]  Coletta Memos, [place]  Other Instructions  WORK ON DIET AND EXERCISE

## 2019-10-28 NOTE — Progress Notes (Signed)
Cardiology Office Note   Date:  10/28/2019   ID:  Dana Horton, DOB 02/17/1946, MRN [id number]  PCP:  Townsend Roger, MD  Cardiologist:   Skeet Latch, MD   No chief complaint on file.   History of Present Illness: Dana Horton is a 74 y.o. female with paroxysmal atrial fibrillation, hypertension, COPD and OSA who presents for follow-up.  She was initially seen 10/2016 for an evaluation of chest pain.  Dana Horton saw Dr. Vassie Loll Eyk on 6/21 and reported chest pain.  At that appointment she was noted to be in atrial fibrillation with a ventricular rate of 140 bpm.  She was referred to [facility] where she reportedly converted back to sinus rhythm when her IV was inserted.  Echo that admission revealed LVEF 60-65% with mild mitral and tricuspid regurgitation.  She was started on metoprolol and they recommended that she take a full strength aspirin. She did this but developed hematochezia. She is always has hematochezia when she tries to take a full strength aspirin or NSAIDS.  She reported fatigue and metoprolol was switched to nightly.  She was also referred to GI. She saw Dr. Loletha Carrow and had an upper endoscopy that was normal.  She also had a colonoscopy that showed 1-2 mm polyp that was removed and another 12 mm polyp that could not be resected as well as a 10 mm polyp in the sigmoid colon for which resection was not attempted.  Her bleeding was thought to be due to to intermittent benign anal bleeding.  She returned for colonoscopy 12/2016 with removal of more polyps.  She was also found to have diverticuli throughout the entire colon.  It was also noted that she had internal hemorrhoids.  It was felt to be safe for her to start anticoagulation 2 weeks following her colonoscopy.  Dana Horton was switched from dronedarone to flecainide due to cost concerns.  At that time she was admitted 03/2017 with atrial fibrillation with rapid  ventricular response.  She was started on IV amiodarone and converted back to rhythm.  Troponin was elevated.  She underwent left heart catheterization that revealed normal coronary arteries.  Flecainide was increased to 100 mg.  She has done well from a atrial fibrillation standpoint.  She saw her PCP and hydrochlorothiazide was discontinued due to acute renal failure.  Her blood pressure became elevated and she was started on losartan 25, which was subsequently increased to 50 mg.  She also noted some lower extremity edema.  Amlodipine was added to her regimen.  She didn't tolerate 10 mg because it caused back pain.  She followed up with her pharmacist and her blood pressure was still elevated so valsartan was further increased.  She has been checking her BP at hoome and it ranges from the 110s-150s.  It gets higher when she has sinus congestion or a cold.  On average it is around 130s.  She is active and gardens and helps to take care of great grandchildren.  She gets tired with this activity.  She has no chest pain and her breathing is stable other than allergies.  She notices wheezing at times.  She continues to use her CPAP regularly.  She ha sno edema, orthopnea or PND.  She hasn't had any episodes of atrial fibrillation.  Her daughter purchased an Press photographer and has been tracking her rhythm.  Past Medical History:  Diagnosis Date  . Allergy   . Arthritis, rheumatoid ([place])   .  Atrial fibrillation ([place])   . Cancer ([place])    bladder  . COPD (chronic obstructive pulmonary disease) ([place])   . Essential hypertension   . Heart murmur   . Lung nodule   . Myocardial infarction [facility])    ? small with atrial fib   . Osteoporosis   . Pneumonia    hx of   . Seasonal allergies   . Sleep apnea    cpap  setting at 2.5 per patient   . Tobacco abuse     Past Surgical History:  Procedure Laterality Date  . ABDOMINAL HYSTERECTOMY    . BLADDER SURGERY    . COLONOSCOPY N/A 12/28/2016   Procedure:  COLONOSCOPY;  Surgeon: Doran Stabler, MD;  Location: [facility];  Service: Gastroenterology;  Laterality: N/A;  . LEFT HEART CATH AND CORONARY ANGIOGRAPHY N/A 04/09/2017   Procedure: LEFT HEART CATH AND CORONARY ANGIOGRAPHY;  Surgeon: Jettie Booze, MD;  Location: [facility];  Service: Cardiovascular;  Laterality: N/A;  . MOUTH SURGERY    . POLYPECTOMY N/A 12/28/2016   Procedure: POLYPECTOMY with injection of [place];  Surgeon: Doran Stabler, MD;  Location: [facility];  Service: Gastroenterology;  Laterality: N/A;      Current Outpatient Medications  Medication Sig Dispense Refill  . amLODipine (NORVASC) 10 MG tablet Take 1 tablet (10 mg total) by mouth daily. (Patient taking differently: Take 5 mg by mouth daily. ) 90 tablet 3  . Calcium Carbonate-Vitamin D (CALTRATE 600+D PO) Take 1 tablet by mouth daily.    . cetirizine (ZYRTEC) 10 MG tablet Take 10 mg by mouth daily as needed for allergies.    Marland Kitchen ELIQUIS 5 MG TABS tablet TAKE 1 TABLET BY MOUTH TWICE DAILY 60 tablet 5  . flecainide (TAMBOCOR) 100 MG tablet Take 1 tablet (100 mg total) by mouth 2 (two) times daily. NEEDS F/U W/DR [place] 180 tablet 0  . fluticasone (FLONASE) 50 MCG/ACT nasal spray Place 2 sprays into both nostrils daily.    . furosemide (LASIX) 20 MG tablet TAKE 1 TABLET BY MOUTH DAILY IF NEEDED FOR SHORTNESS OF BREATH OR LOWER EXTEMITY EDEMA 90 tablet 3  . metoprolol succinate (TOPROL-XL) 25 MG 24 hr tablet Take 1 tablet (25 mg total) by mouth 2 (two) times daily. Take with or immediately following a meal. 90 tablet 3  . SYMBICORT 160-4.5 MCG/ACT inhaler Take 2 puffs by mouth 2 (two) times daily.  3  . valsartan (DIOVAN) 320 MG tablet Take 1 tablet (320 mg total) by mouth daily. 90 tablet 3   No current facility-administered medications for this visit.    Allergies:   Albuterol and Lisinopril    Social History:  The patient  reports that she quit smoking about 2 years ago. She has a 26.00  pack-year smoking history. She has never used smokeless tobacco. She reports that she does not drink alcohol and does not use drugs.   Family History:  The patient's family history includes Arrhythmia in her father; Bladder Cancer in her brother; CAD in her father; Cancer in her father; Heart disease in her father; Hypertension in her mother; Lung cancer in her brother; Stroke in her mother.    ROS:  Please see the history of present illness.   Otherwise, review of systems are positive for none.   All other systems are reviewed and negative.    PHYSICAL EXAM: VS:  BP 130/69   Pulse 68   Temp (!) 96.1 F (35.6  C)   Ht 5\' 4"  (1.626 m)   Wt 163 lb (73.9 kg)   LMP  (LMP Unknown)   SpO2 97%   BMI 27.98 kg/m  , BMI Body mass index is 27.98 kg/m. GENERAL:  Well appearing HEENT: Pupils equal round and reactive, fundi not visualized, oral mucosa unremarkable NECK:  No jugular venous distention, waveform within normal limits, carotid upstroke brisk and symmetric, no bruits, no thyromegaly LYMPHATICS:  No cervical adenopathy LUNGS:  Clear to auscultation bilaterally HEART:  RRR.  PMI not displaced or sustained,S1 and S2 within normal limits, no S3, no S4, no clicks, no rubs, III/VI holosystolic murmur at the apex with radiation anteriorly ABD:  Flat, positive bowel sounds normal in frequency in pitch, no bruits, no rebound, no guarding, no midline pulsatile mass, no hepatomegaly, no splenomegaly EXT:  2 plus pulses throughout, no edema, no cyanosis no clubbing SKIN:  No rashes no nodules NEURO:  Cranial nerves II through XII grossly intact, motor grossly intact throughout PSYCH:  Cognitively intact, oriented to person place and time  EKG:  EKG is not ordered today. The ekg ordered 10/24/16 demonstrates sinus bradycardia. Rate 53 bpm. 07/08/19: Sinus bradycardia.  Rate 56 bpm.  Prior septal infarct.   Echo 09/14/16: LVEF 60-65%.  Abnormal diastolic function.  Mild mitral regurgitation and  tricuspid regurgitation.  PASP 31 mmHg.  Recent Labs: 07/21/2019: BUN 15; Creatinine, Ser 0.92; Potassium 3.6; Sodium 146   03/14/16: WBC 8.1, hemoglobin 13.2, hematocrit 38.9, platelets 195 Sodium 143, potassium 3.6, BUN 18, creatinine 0.88 AST 14, ALT 8 Chol 178, triglycerides 199, HDL 33, LDL 102 TSH 0.83  LHC 04/09/17:  The left ventricular systolic function is normal.  LV end diastolic pressure is normal. LVEDP 16 mm Hg.  The left ventricular ejection fraction is greater than 65% by visual estimate.  There is no aortic valve stenosis.  No angiographically apparent CAD.  Lipid Panel No results found for: CHOL, TRIG, HDL, CHOLHDL, VLDL, LDLCALC, LDLDIRECT    Wt Readings from Last 3 Encounters:  10/28/19 163 lb (73.9 kg)  07/21/19 162 lb 12.8 oz (73.8 kg)  07/08/19 161 lb (73 kg)      ASSESSMENT AND PLAN:  # Paroxysmal atrial fibrillation:  Dana Horton had recurrent atrial fibrillation in the setting of pneumonia.  Flecainide was increased.  She has been doing well and has no recurrence since discharge.  Continue flecainide, metoprolol, and Eliquis.   This patients CHA2DS2-VASc Score and unadjusted Ischemic Stroke Rate (% per year) is equal to 3.2 % stroke rate/year from a score of 3  Above score calculated as 1 point each if present [CHF, HTN, DM, Vascular=MI/PAD/Aortic Plaque, Age if 65-74, or Female] Above score calculated as 2 points each if present [Age > 75, or Stroke/TIA/TE]  # Hypertension: Blood pressure is better controlled overall and is at goal today.  At home she has had times when it has been elevated.  She is going to continue her current regimen of amlodipine, metoprolol, valsartan, and furosemide.  She will work on increasing her exercise and really limiting her sodium intake.  She will continue to track it and bring it to follow-up.  # Tobacco abuse: Patient congratulated on cessation.   Current medicines are reviewed at length with the  patient today.  The patient does not have concerns regarding medicines.  The following changes have been made: stop losartan.  Start valsartan and amlodipine  Labs/ tests ordered today include:   No orders of the defined  types were placed in this encounter.     Disposition:   FU with Kristl Morioka C. Oval Linsey, MD, [facility] in 4 months    This note was written with the assistance of speech recognition software.  Please excuse any transcriptional errors.  Signed, Gavriel Holzhauer C. Oval Linsey, MD, [facility]  10/28/2019 6:01 PM    [place]

## 2019-11-04 DIAGNOSIS — I10 Essential (primary) hypertension: Secondary | ICD-10-CM | POA: Diagnosis not present

## 2019-11-04 DIAGNOSIS — I4891 Unspecified atrial fibrillation: Secondary | ICD-10-CM | POA: Diagnosis not present

## 2019-12-02 DIAGNOSIS — M5416 Radiculopathy, lumbar region: Secondary | ICD-10-CM | POA: Diagnosis not present

## 2020-02-05 DIAGNOSIS — H6690 Otitis media, unspecified, unspecified ear: Secondary | ICD-10-CM | POA: Diagnosis not present

## 2020-02-05 DIAGNOSIS — H6090 Unspecified otitis externa, unspecified ear: Secondary | ICD-10-CM | POA: Diagnosis not present

## 2020-02-05 DIAGNOSIS — R059 Cough, unspecified: Secondary | ICD-10-CM | POA: Diagnosis not present

## 2020-02-08 DIAGNOSIS — Z23 Encounter for immunization: Secondary | ICD-10-CM | POA: Diagnosis not present

## 2020-02-08 DIAGNOSIS — I10 Essential (primary) hypertension: Secondary | ICD-10-CM | POA: Diagnosis not present

## 2020-03-07 ENCOUNTER — Telehealth: Payer: Self-pay | Admitting: Cardiovascular Disease

## 2020-03-07 NOTE — Telephone Encounter (Signed)
Preferred in person but if patient needs to be virtual ok Left message to call back

## 2020-03-07 NOTE — Telephone Encounter (Signed)
Patient is calling wanting to change her 03/10/20 appointment to a virtual visit. Please call.

## 2020-03-10 ENCOUNTER — Encounter: Payer: Self-pay | Admitting: Cardiovascular Disease

## 2020-03-10 ENCOUNTER — Telehealth (INDEPENDENT_AMBULATORY_CARE_PROVIDER_SITE_OTHER): Payer: Medicare Other | Admitting: Cardiovascular Disease

## 2020-03-10 VITALS — BP 128/74 | HR 74 | Ht 64.0 in | Wt 160.0 lb

## 2020-03-10 DIAGNOSIS — Z7901 Long term (current) use of anticoagulants: Secondary | ICD-10-CM | POA: Diagnosis not present

## 2020-03-10 DIAGNOSIS — I10 Essential (primary) hypertension: Secondary | ICD-10-CM | POA: Diagnosis not present

## 2020-03-10 DIAGNOSIS — I480 Paroxysmal atrial fibrillation: Secondary | ICD-10-CM | POA: Diagnosis not present

## 2020-03-10 NOTE — Telephone Encounter (Signed)
Patient had visit today.

## 2020-03-10 NOTE — Patient Instructions (Signed)
Medication Instructions:  Your physician recommends that you continue on your current medications as directed. Please refer to the Current Medication list given to you today.  *If you need a refill on your cardiac medications before your next appointment, please call your pharmacy*   Lab Work: NONE   Testing/Procedures: NONE  Follow-Up: At [facility], you and your health needs are our priority.  As part of our continuing mission to provide you with exceptional heart care, we have created designated Provider Care Teams.  These Care Teams include your primary Cardiologist (physician) and Advanced Practice Providers (APPs -  Physician Assistants and Nurse Practitioners) who all work together to provide you with the care you need, when you need it.  We recommend signing up for the patient portal called "MyChart".  Sign up information is provided on this After Visit Summary.  MyChart is used to connect with patients for Virtual Visits (Telemedicine).  Patients are able to view lab/test results, encounter notes, upcoming appointments, etc.  Non-urgent messages can be sent to your provider as well.   To learn more about what you can do with MyChart, go to [email or web address].    Your next appointment:   09/15/2020 AT 2:20 IN PERSON WITH DR [facility]

## 2020-03-10 NOTE — Progress Notes (Signed)
Virtual Visit via Telephone Note   This visit type was conducted due to national recommendations for restrictions regarding the COVID-19 Pandemic (e.g. social distancing) in an effort to limit this patient's exposure and mitigate transmission in our community.  Due to her co-morbid illnesses, this patient is at least at moderate risk for complications without adequate follow up.  This format is felt to be most appropriate for this patient at this time.  The patient did not have access to video technology/had technical difficulties with video requiring transitioning to audio format only (telephone).  All issues noted in this document were discussed and addressed.  No physical exam could be performed with this format.  Please refer to the patient's chart for her  consent to telehealth for [facility].   The patient was identified using 2 identifiers.  Date:  03/10/2020   ID:  Dana Horton, DOB Mar 29, 1945, MRN [id number]  Patient Location: Home Provider Location: Office/Clinic  PCP:  Townsend Roger, MD  Cardiologist:  Skeet Latch, MD  Electrophysiologist:  None   Evaluation Performed:  Follow-Up Visit  Chief Complaint:    History of Present Illness:     Date:  03/10/2020   ID:  Dana Horton, DOB July 23, 1945, MRN [id number]  PCP:  Townsend Roger, MD  Cardiologist:   Skeet Latch, MD   No chief complaint on file.   History of Present Illness: Dana Horton is a 74 y.o. female with paroxysmal atrial fibrillation, hypertension, COPD and OSA who presents for follow-up.  She was initially seen 10/2016 for an evaluation of chest pain.  Dana Horton saw Dr. Vassie Loll Eyk on 6/21 and reported chest pain.  At that appointment she was noted to be in atrial fibrillation with a ventricular rate of 140 bpm.  She was referred to [facility] where she reportedly converted back to sinus rhythm when her IV was inserted.  Echo  that admission revealed LVEF 60-65% with mild mitral and tricuspid regurgitation.  She was started on metoprolol and they recommended that she take a full strength aspirin. She did this but developed hematochezia. She is always has hematochezia when she tries to take a full strength aspirin or NSAIDS.  She reported fatigue and metoprolol was switched to nightly.  She was also referred to GI. She saw Dr. Loletha Carrow and had an upper endoscopy that was normal.  She also had a colonoscopy that showed 1-2 mm polyp that was removed and another 12 mm polyp that could not be resected as well as a 10 mm polyp in the sigmoid colon for which resection was not attempted.  Her bleeding was thought to be due to to intermittent benign anal bleeding.  She returned for colonoscopy 12/2016 with removal of more polyps.  She was also found to have diverticuli throughout the entire colon.  It was also noted that she had internal hemorrhoids.  It was felt to be safe for her to start anticoagulation 2 weeks following her colonoscopy.  Dana Horton was switched from dronedarone to flecainide due to cost concerns.  At that time she was admitted 03/2017 with atrial fibrillation with rapid ventricular response.  She was started on IV amiodarone and converted back to rhythm.  Troponin was elevated.  She underwent left heart catheterization that revealed normal coronary arteries.  Flecainide was increased to 100 mg.  She has done well from a atrial fibrillation standpoint.  She saw her PCP and hydrochlorothiazide was discontinued due to acute renal failure.  Her blood pressure became elevated and she was started on losartan 25, which was subsequently increased to 50 mg.  She also noted some lower extremity edema.  Amlodipine was added to her regimen.  She didn't tolerate 10 mg because it caused back pain.  She followed up with her pharmacist and her blood pressure was still elevated so valsartan was further increased.  She has been checking  her BP at hoome and it ranges from the 110s-150s.  It gets higher when she has sinus congestion or a cold.  On average it is around 130s.  She is active and gardens and helps to take care of great grandchildren.  She gets tired with this activity.  She has no chest pain and her breathing is stable other than allergies.  She notices wheezing at times.  She continues to use her CPAP regularly.  She ha sno edema, orthopnea or PND.  She hasn't had any episodes of atrial fibrillation.  Her daughter purchased an Press photographer and has been tracking her rhythm.  Since her last appointment sheSince her last appointment she had pinched nerve in her back.  She notes that her BP has been elevated when she is in pain but it is otherwise well-controlled.  She denies chest pain or pressure.  Her breathing is difficult when outisde but OK in the house.  She has occasional ankle swelling that responds to her diuretic.  She continues to use her CPAP reguarly and is otherwise without complaint.  Past Medical History:  Diagnosis Date  . Allergy   . Arthritis, rheumatoid ([place])   . Atrial fibrillation ([place])   . Cancer ([place])    bladder  . COPD (chronic obstructive pulmonary disease) ([place])   . Essential hypertension   . Heart murmur   . Lung nodule   . Myocardial infarction [facility])    ? small with atrial fib   . Osteoporosis   . Pneumonia    hx of   . Seasonal allergies   . Sleep apnea    cpap  setting at 2.5 per patient   . Tobacco abuse     Past Surgical History:  Procedure Laterality Date  . ABDOMINAL HYSTERECTOMY    . BLADDER SURGERY    . COLONOSCOPY N/A 12/28/2016   Procedure: COLONOSCOPY;  Surgeon: Doran Stabler, MD;  Location: [facility];  Service: Gastroenterology;  Laterality: N/A;  . LEFT HEART CATH AND CORONARY ANGIOGRAPHY N/A 04/09/2017   Procedure: LEFT HEART CATH AND CORONARY ANGIOGRAPHY;  Surgeon: Jettie Booze, MD;  Location: [facility];  Service: Cardiovascular;   Laterality: N/A;  . MOUTH SURGERY    . POLYPECTOMY N/A 12/28/2016   Procedure: POLYPECTOMY with injection of [place];  Surgeon: Doran Stabler, MD;  Location: [facility];  Service: Gastroenterology;  Laterality: N/A;      Current Outpatient Medications  Medication Sig Dispense Refill  . amLODipine (NORVASC) 10 MG tablet Take 1 tablet (10 mg total) by mouth daily. (Patient taking differently: Take 5 mg by mouth daily.) 90 tablet 3  . Calcium Carbonate-Vitamin D (CALTRATE 600+D PO) Take 1 tablet by mouth daily.    . cetirizine (ZYRTEC) 10 MG tablet Take 10 mg by mouth daily as needed for allergies.    Marland Kitchen ELIQUIS 5 MG TABS tablet TAKE 1 TABLET BY MOUTH TWICE DAILY 60 tablet 5  . flecainide (TAMBOCOR) 100 MG tablet Take 1 tablet (100 mg total) by mouth 2 (two) times daily. NEEDS F/U W/DR  [place] 180 tablet 0  . fluticasone (FLONASE) 50 MCG/ACT nasal spray Place 2 sprays into both nostrils daily.    . furosemide (LASIX) 20 MG tablet TAKE 1 TABLET BY MOUTH DAILY IF NEEDED FOR SHORTNESS OF BREATH OR LOWER EXTEMITY EDEMA 90 tablet 3  . metoprolol succinate (TOPROL-XL) 25 MG 24 hr tablet Take 1 tablet (25 mg total) by mouth 2 (two) times daily. Take with or immediately following a meal. 90 tablet 3  . SYMBICORT 160-4.5 MCG/ACT inhaler Take 2 puffs by mouth 2 (two) times daily.  3  . valsartan (DIOVAN) 320 MG tablet Take 1 tablet (320 mg total) by mouth daily. 90 tablet 3   No current facility-administered medications for this visit.    Allergies:   Albuterol and Lisinopril    Social History:  The patient  reports that she quit smoking about 2 years ago. She has a 26.00 pack-year smoking history. She has never used smokeless tobacco. She reports that she does not drink alcohol and does not use drugs.   Family History:  The patient's family history includes Arrhythmia in her father; Bladder Cancer in her brother; CAD in her father; Cancer in her father; Heart disease in her father; Hypertension  in her mother; Lung cancer in her brother; Stroke in her mother.    ROS:  Please see the history of present illness.   Otherwise, review of systems are positive for none.   All other systems are reviewed and negative.    PHYSICAL EXAM: VS:  BP 128/74   Pulse 74   Ht 5\' 4"  (1.626 m)   Wt 160 lb (72.6 kg)   LMP  (LMP Unknown)   BMI 27.46 kg/m  , BMI Body mass index is 27.46 kg/m. GENERAL:  Well appearing HEENT: Pupils equal round and reactive, fundi not visualized, oral mucosa unremarkable NECK:  No jugular venous distention, waveform within normal limits, carotid upstroke brisk and symmetric, no bruits, no thyromegaly LYMPHATICS:  No cervical adenopathy LUNGS:  Clear to auscultation bilaterally HEART:  RRR.  PMI not displaced or sustained,S1 and S2 within normal limits, no S3, no S4, no clicks, no rubs, III/VI holosystolic murmur at the apex with radiation anteriorly ABD:  Flat, positive bowel sounds normal in frequency in pitch, no bruits, no rebound, no guarding, no midline pulsatile mass, no hepatomegaly, no splenomegaly EXT:  2 plus pulses throughout, no edema, no cyanosis no clubbing SKIN:  No rashes no nodules NEURO:  Cranial nerves II through XII grossly intact, motor grossly intact throughout PSYCH:  Cognitively intact, oriented to person place and time  EKG:  EKG is not ordered today. The ekg ordered 10/24/16 demonstrates sinus bradycardia. Rate 53 bpm. 07/08/19: Sinus bradycardia.  Rate 56 bpm.  Prior septal infarct.   Echo 09/14/16: LVEF 60-65%.  Abnormal diastolic function.  Mild mitral regurgitation and tricuspid regurgitation.  PASP 31 mmHg.  Recent Labs: 07/21/2019: BUN 15; Creatinine, Ser 0.92; Potassium 3.6; Sodium 146   03/14/16: WBC 8.1, hemoglobin 13.2, hematocrit 38.9, platelets 195 Sodium 143, potassium 3.6, BUN 18, creatinine 0.88 AST 14, ALT 8 Chol 178, triglycerides 199, HDL 33, LDL 102 TSH 0.83  LHC 04/09/17:  The left ventricular systolic function is  normal.  LV end diastolic pressure is normal. LVEDP 16 mm Hg.  The left ventricular ejection fraction is greater than 65% by visual estimate.  There is no aortic valve stenosis.  No angiographically apparent CAD.  Lipid Panel No results found for: CHOL, TRIG, HDL, CHOLHDL, VLDL,  LDLCALC, LDLDIRECT    Wt Readings from Last 3 Encounters:  03/10/20 160 lb (72.6 kg)  10/28/19 163 lb (73.9 kg)  07/21/19 162 lb 12.8 oz (73.8 kg)      ASSESSMENT AND PLAN:  # Paroxysmal atrial fibrillation:  Ms. Platt had recurrent atrial fibrillation in the setting of pneumonia.  Flecainide was increased.  She has been doing well and has no recurrence since discharge.  Continue flecainide, metoprolol, and Eliquis.  She needs an EKG at follow up.  This patients CHA2DS2-VASc Score and unadjusted Ischemic Stroke Rate (% per year) is equal to 3.2 % stroke rate/year from a score of 3  Above score calculated as 1 point each if present [CHF, HTN, DM, Vascular=MI/PAD/Aortic Plaque, Age if 65-74, or Female] Above score calculated as 2 points each if present [Age > 75, or Stroke/TIA/TE]  # Hypertension: Blood pressure is better controlled overall and is at goal today.  At home she has had times when it has been elevated.  She is going to continue her current regimen of amlodipine, metoprolol, valsartan, and furosemide.  She will work on increasing her exercise and really limiting her sodium intake.  She will continue to track it and bring it to follow-up.  # Tobacco abuse: Patient congratulated on cessation.   Current medicines are reviewed at length with the patient today.  The patient does not have concerns regarding medicines.  The following changes have been made: none  Labs/ tests ordered today include:   No orders of the defined types were placed in this encounter.     COVID-19 Education: The signs and symptoms of COVID-19 were discussed with the patient and how to seek care for testing  (follow up with PCP or arrange E-visit).  The importance of social distancing was discussed today.  Time:   Today, I have spent 17 minutes with the patient with telehealth technology discussing the above problems.    Disposition:   FU with Layth Cerezo C. Oval Linsey, MD, [facility] in 4 months    This note was written with the assistance of speech recognition software.  Please excuse any transcriptional errors.  Signed, Halston Kintz C. Oval Linsey, MD, [facility]  03/10/2020 2:52 PM    [facility]

## 2020-05-03 DIAGNOSIS — I10 Essential (primary) hypertension: Secondary | ICD-10-CM | POA: Diagnosis not present

## 2020-05-03 DIAGNOSIS — N1831 Chronic kidney disease, stage 3a: Secondary | ICD-10-CM | POA: Diagnosis not present

## 2020-05-03 DIAGNOSIS — I4891 Unspecified atrial fibrillation: Secondary | ICD-10-CM | POA: Diagnosis not present

## 2020-05-03 DIAGNOSIS — J449 Chronic obstructive pulmonary disease, unspecified: Secondary | ICD-10-CM | POA: Diagnosis not present

## 2020-05-03 DIAGNOSIS — Z1339 Encounter for screening examination for other mental health and behavioral disorders: Secondary | ICD-10-CM | POA: Diagnosis not present

## 2020-05-03 DIAGNOSIS — Z6828 Body mass index (BMI) 28.0-28.9, adult: Secondary | ICD-10-CM | POA: Diagnosis not present

## 2020-05-03 DIAGNOSIS — Z1331 Encounter for screening for depression: Secondary | ICD-10-CM | POA: Diagnosis not present

## 2020-05-03 DIAGNOSIS — R911 Solitary pulmonary nodule: Secondary | ICD-10-CM | POA: Diagnosis not present

## 2020-05-03 DIAGNOSIS — M1990 Unspecified osteoarthritis, unspecified site: Secondary | ICD-10-CM | POA: Diagnosis not present

## 2020-05-03 DIAGNOSIS — E7800 Pure hypercholesterolemia, unspecified: Secondary | ICD-10-CM | POA: Diagnosis not present

## 2020-05-03 DIAGNOSIS — Z87891 Personal history of nicotine dependence: Secondary | ICD-10-CM | POA: Diagnosis not present

## 2020-05-03 DIAGNOSIS — Z0000 Encounter for general adult medical examination without abnormal findings: Secondary | ICD-10-CM | POA: Diagnosis not present

## 2020-05-06 ENCOUNTER — Telehealth: Payer: Self-pay | Admitting: Gastroenterology

## 2020-05-06 NOTE — Telephone Encounter (Signed)
Hi Dana Horton, we received a referral from pt's PCP for a repeat colon because pt is overdue. Per recall tab pt needs procedure at the hospital. Could you please help [place] with this referral? Thank you

## 2020-05-10 ENCOUNTER — Telehealth: Payer: Self-pay

## 2020-05-10 ENCOUNTER — Other Ambulatory Visit: Payer: Self-pay

## 2020-05-10 DIAGNOSIS — Z86010 Personal history of colonic polyps: Secondary | ICD-10-CM

## 2020-05-10 NOTE — Telephone Encounter (Signed)
Spoke with patient to schedule colonoscopy at [facility] with Dr. Loletha Carrow. Patient is scheduled for a virtual pre-visit on Thursday, 05/26/20 at 9 AM. COVID test is scheduled for Thursday, 06/09/20 at 8 AM. Colonoscopy is scheduled for Monday, 06/13/20 at 9:15 AM, patient will need to arrive at 7:45 AM with a care partner. Patient is on Eliquis, clearance request sent to Dr. Skeet Latch. Patient verbalized understanding of all information and had no concerns at the end of the call.

## 2020-05-10 NOTE — Telephone Encounter (Signed)
[facility] Pre-operative Risk Assessment     Request for surgical clearance:     Endoscopy Procedure  What type of surgery is being performed?   Colonoscopy  When is this surgery scheduled?     3/21/2 What type of clearance is required ?   Pharmacy  Are there any medications that need to be held prior to surgery and how long? Eliquis - 2 days  Practice name and name of physician performing surgery?      [facility]  What is your office phone and fax number?      Phone- [phone]  [phone]  Anesthesia type (None, local, MAC, general) ?       MAC

## 2020-05-11 NOTE — Telephone Encounter (Signed)
Will route to PharmD for rec's re: holding anticoagulation. Richardson Dopp, PA-C    05/11/2020 9:48 AM

## 2020-05-11 NOTE — Telephone Encounter (Signed)
Patient with diagnosis of afib on Eliquis for anticoagulation.    Procedure: Colonoscopy Date of procedure: 06/13/20  CHA2DS2-VASc Score = 4  This indicates a 4.8% annual risk of stroke. The patient's score is based upon: CHF History: Yes HTN History: Yes Diabetes History: No Stroke History: No Vascular Disease History: No Age Score: 1 Gender Score: 1     CrCl 72ml/min  Per office protocol, patient can hold Eliquis for 2 days prior to procedure.

## 2020-05-12 NOTE — Telephone Encounter (Signed)
Noted, thank you

## 2020-05-26 ENCOUNTER — Telehealth: Payer: Self-pay | Admitting: *Deleted

## 2020-05-26 NOTE — Telephone Encounter (Signed)
I called the patient several times using both phone numbers listed since 9 am today for the phone PV. No answer, left a message for the patient to return my call to reschedule the PV before 5 pm today or we have to cx procedure.

## 2020-05-26 NOTE — Telephone Encounter (Signed)
Noted  

## 2020-05-26 NOTE — Telephone Encounter (Signed)
Inbound call from patient stating she is going to have have cataract surgery and with husband being sick currently she would prefer to postpone her procedure scheduled for 06/13/20.  She said she will give [place] a call back when ready to reschedule.

## 2020-05-26 NOTE — Telephone Encounter (Signed)
Noted, procedure cancelled at [facility] on 06/13/20.

## 2020-05-29 DIAGNOSIS — M1990 Unspecified osteoarthritis, unspecified site: Secondary | ICD-10-CM | POA: Diagnosis not present

## 2020-05-29 DIAGNOSIS — H109 Unspecified conjunctivitis: Secondary | ICD-10-CM | POA: Diagnosis not present

## 2020-05-29 DIAGNOSIS — Z79899 Other long term (current) drug therapy: Secondary | ICD-10-CM | POA: Diagnosis not present

## 2020-05-29 DIAGNOSIS — Z87891 Personal history of nicotine dependence: Secondary | ICD-10-CM | POA: Diagnosis not present

## 2020-05-29 DIAGNOSIS — Z7952 Long term (current) use of systemic steroids: Secondary | ICD-10-CM | POA: Diagnosis not present

## 2020-05-29 DIAGNOSIS — I10 Essential (primary) hypertension: Secondary | ICD-10-CM | POA: Diagnosis not present

## 2020-05-29 DIAGNOSIS — I252 Old myocardial infarction: Secondary | ICD-10-CM | POA: Diagnosis not present

## 2020-05-29 DIAGNOSIS — I4891 Unspecified atrial fibrillation: Secondary | ICD-10-CM | POA: Diagnosis not present

## 2020-05-29 DIAGNOSIS — J449 Chronic obstructive pulmonary disease, unspecified: Secondary | ICD-10-CM | POA: Diagnosis not present

## 2020-05-29 DIAGNOSIS — Z7901 Long term (current) use of anticoagulants: Secondary | ICD-10-CM | POA: Diagnosis not present

## 2020-06-09 ENCOUNTER — Other Ambulatory Visit (HOSPITAL_COMMUNITY): Payer: Medicare Other

## 2020-06-10 ENCOUNTER — Other Ambulatory Visit (HOSPITAL_COMMUNITY): Payer: Medicare Other

## 2020-06-13 ENCOUNTER — Ambulatory Visit (HOSPITAL_COMMUNITY): Admit: 2020-06-13 | Payer: Medicare Other | Admitting: Gastroenterology

## 2020-06-13 ENCOUNTER — Encounter (HOSPITAL_COMMUNITY): Payer: Self-pay

## 2020-06-13 SURGERY — COLONOSCOPY WITH PROPOFOL
Anesthesia: Monitor Anesthesia Care

## 2020-07-23 DIAGNOSIS — I10 Essential (primary) hypertension: Secondary | ICD-10-CM | POA: Diagnosis not present

## 2020-07-23 DIAGNOSIS — M1990 Unspecified osteoarthritis, unspecified site: Secondary | ICD-10-CM | POA: Diagnosis not present

## 2020-08-03 DIAGNOSIS — Z87891 Personal history of nicotine dependence: Secondary | ICD-10-CM | POA: Diagnosis not present

## 2020-08-03 DIAGNOSIS — R059 Cough, unspecified: Secondary | ICD-10-CM | POA: Diagnosis not present

## 2020-08-03 DIAGNOSIS — Z720 Tobacco use: Secondary | ICD-10-CM | POA: Diagnosis not present

## 2020-08-03 DIAGNOSIS — J302 Other seasonal allergic rhinitis: Secondary | ICD-10-CM | POA: Diagnosis not present

## 2020-08-03 DIAGNOSIS — J449 Chronic obstructive pulmonary disease, unspecified: Secondary | ICD-10-CM | POA: Diagnosis not present

## 2020-08-03 DIAGNOSIS — I10 Essential (primary) hypertension: Secondary | ICD-10-CM | POA: Diagnosis not present

## 2020-08-03 DIAGNOSIS — E7800 Pure hypercholesterolemia, unspecified: Secondary | ICD-10-CM | POA: Diagnosis not present

## 2020-08-04 NOTE — Telephone Encounter (Signed)
Spoke with patient to reschedule her procedure, she states that there were "too many" appointments because she would need a pre-visit appt, pre-screening COVID appt, and the colonoscopy appt. She states that her husband is on Oxygen and she does not live in the area. Patient had no further concerns at the end of the call.

## 2020-08-04 NOTE — Telephone Encounter (Signed)
Inbound call from patient. Calling in to have her colonoscopy at [facility] scheduled. Best contact number [phone]

## 2020-08-11 DIAGNOSIS — Z1231 Encounter for screening mammogram for malignant neoplasm of breast: Secondary | ICD-10-CM | POA: Diagnosis not present

## 2020-08-19 DIAGNOSIS — L049 Acute lymphadenitis, unspecified: Secondary | ICD-10-CM | POA: Diagnosis not present

## 2020-08-19 DIAGNOSIS — R6884 Jaw pain: Secondary | ICD-10-CM | POA: Diagnosis not present

## 2020-08-23 DIAGNOSIS — I4891 Unspecified atrial fibrillation: Secondary | ICD-10-CM | POA: Diagnosis not present

## 2020-08-23 DIAGNOSIS — I10 Essential (primary) hypertension: Secondary | ICD-10-CM | POA: Diagnosis not present

## 2020-09-06 DIAGNOSIS — R059 Cough, unspecified: Secondary | ICD-10-CM | POA: Diagnosis not present

## 2020-09-13 DIAGNOSIS — H25813 Combined forms of age-related cataract, bilateral: Secondary | ICD-10-CM | POA: Diagnosis not present

## 2020-09-13 DIAGNOSIS — H353131 Nonexudative age-related macular degeneration, bilateral, early dry stage: Secondary | ICD-10-CM | POA: Diagnosis not present

## 2020-09-15 ENCOUNTER — Ambulatory Visit: Payer: Medicare Other | Admitting: Cardiovascular Disease

## 2020-09-20 DIAGNOSIS — I10 Essential (primary) hypertension: Secondary | ICD-10-CM | POA: Diagnosis not present

## 2020-09-20 DIAGNOSIS — J439 Emphysema, unspecified: Secondary | ICD-10-CM | POA: Diagnosis not present

## 2020-09-20 DIAGNOSIS — H25811 Combined forms of age-related cataract, right eye: Secondary | ICD-10-CM | POA: Diagnosis not present

## 2020-09-20 DIAGNOSIS — E785 Hyperlipidemia, unspecified: Secondary | ICD-10-CM | POA: Diagnosis not present

## 2020-09-20 DIAGNOSIS — H2511 Age-related nuclear cataract, right eye: Secondary | ICD-10-CM | POA: Diagnosis not present

## 2020-09-20 DIAGNOSIS — J449 Chronic obstructive pulmonary disease, unspecified: Secondary | ICD-10-CM | POA: Diagnosis not present

## 2020-09-20 DIAGNOSIS — H259 Unspecified age-related cataract: Secondary | ICD-10-CM | POA: Diagnosis not present

## 2020-09-20 DIAGNOSIS — Z87891 Personal history of nicotine dependence: Secondary | ICD-10-CM | POA: Diagnosis not present

## 2020-09-22 DIAGNOSIS — I4891 Unspecified atrial fibrillation: Secondary | ICD-10-CM | POA: Diagnosis not present

## 2020-09-22 DIAGNOSIS — M1990 Unspecified osteoarthritis, unspecified site: Secondary | ICD-10-CM | POA: Diagnosis not present

## 2020-10-25 DIAGNOSIS — Z01818 Encounter for other preprocedural examination: Secondary | ICD-10-CM | POA: Diagnosis not present

## 2020-10-25 DIAGNOSIS — H25812 Combined forms of age-related cataract, left eye: Secondary | ICD-10-CM | POA: Diagnosis not present

## 2020-11-01 DIAGNOSIS — Z7901 Long term (current) use of anticoagulants: Secondary | ICD-10-CM | POA: Diagnosis not present

## 2020-11-01 DIAGNOSIS — J449 Chronic obstructive pulmonary disease, unspecified: Secondary | ICD-10-CM | POA: Diagnosis not present

## 2020-11-01 DIAGNOSIS — I4891 Unspecified atrial fibrillation: Secondary | ICD-10-CM | POA: Diagnosis not present

## 2020-11-01 DIAGNOSIS — H25812 Combined forms of age-related cataract, left eye: Secondary | ICD-10-CM | POA: Diagnosis not present

## 2020-11-01 DIAGNOSIS — H259 Unspecified age-related cataract: Secondary | ICD-10-CM | POA: Diagnosis not present

## 2020-11-01 DIAGNOSIS — I10 Essential (primary) hypertension: Secondary | ICD-10-CM | POA: Diagnosis not present

## 2020-11-04 DIAGNOSIS — I10 Essential (primary) hypertension: Secondary | ICD-10-CM | POA: Diagnosis not present

## 2020-11-04 DIAGNOSIS — E7800 Pure hypercholesterolemia, unspecified: Secondary | ICD-10-CM | POA: Diagnosis not present

## 2020-11-21 DIAGNOSIS — H524 Presbyopia: Secondary | ICD-10-CM | POA: Diagnosis not present

## 2020-11-23 DIAGNOSIS — I10 Essential (primary) hypertension: Secondary | ICD-10-CM | POA: Diagnosis not present

## 2020-11-23 DIAGNOSIS — N183 Chronic kidney disease, stage 3 unspecified: Secondary | ICD-10-CM | POA: Diagnosis not present

## 2020-12-05 ENCOUNTER — Encounter (HOSPITAL_COMMUNITY): Payer: Self-pay

## 2020-12-05 ENCOUNTER — Emergency Department (HOSPITAL_COMMUNITY): Payer: Medicare Other

## 2020-12-05 ENCOUNTER — Other Ambulatory Visit: Payer: Self-pay

## 2020-12-05 ENCOUNTER — Emergency Department (HOSPITAL_COMMUNITY)
Admission: EM | Admit: 2020-12-05 | Discharge: 2020-12-06 | Disposition: A | Discharge status: Home / Self Care | Payer: Medicare Other | Attending: Emergency Medicine | Admitting: Emergency Medicine

## 2020-12-05 DIAGNOSIS — R0602 Shortness of breath: Secondary | ICD-10-CM | POA: Insufficient documentation

## 2020-12-05 DIAGNOSIS — I700 Atherosclerosis of aorta: Secondary | ICD-10-CM | POA: Diagnosis not present

## 2020-12-05 DIAGNOSIS — I110 Hypertensive heart disease with heart failure: Secondary | ICD-10-CM | POA: Diagnosis not present

## 2020-12-05 DIAGNOSIS — R062 Wheezing: Secondary | ICD-10-CM | POA: Diagnosis not present

## 2020-12-05 DIAGNOSIS — I5032 Chronic diastolic (congestive) heart failure: Secondary | ICD-10-CM | POA: Insufficient documentation

## 2020-12-05 DIAGNOSIS — R059 Cough, unspecified: Secondary | ICD-10-CM | POA: Diagnosis not present

## 2020-12-05 DIAGNOSIS — J9811 Atelectasis: Secondary | ICD-10-CM | POA: Diagnosis not present

## 2020-12-05 DIAGNOSIS — J441 Chronic obstructive pulmonary disease with (acute) exacerbation: Secondary | ICD-10-CM | POA: Diagnosis not present

## 2020-12-05 DIAGNOSIS — Z8551 Personal history of malignant neoplasm of bladder: Secondary | ICD-10-CM | POA: Insufficient documentation

## 2020-12-05 DIAGNOSIS — Z79899 Other long term (current) drug therapy: Secondary | ICD-10-CM | POA: Diagnosis not present

## 2020-12-05 DIAGNOSIS — J449 Chronic obstructive pulmonary disease, unspecified: Secondary | ICD-10-CM | POA: Insufficient documentation

## 2020-12-05 DIAGNOSIS — Z87891 Personal history of nicotine dependence: Secondary | ICD-10-CM | POA: Insufficient documentation

## 2020-12-05 DIAGNOSIS — Z20822 Contact with and (suspected) exposure to covid-19: Secondary | ICD-10-CM | POA: Insufficient documentation

## 2020-12-05 DIAGNOSIS — Z7901 Long term (current) use of anticoagulants: Secondary | ICD-10-CM | POA: Insufficient documentation

## 2020-12-05 LAB — RESP PANEL BY RT-PCR (FLU A&B, COVID) ARPGX2
Influenza A by PCR: NEGATIVE
Influenza B by PCR: NEGATIVE
SARS Coronavirus 2 by RT PCR: NEGATIVE

## 2020-12-05 NOTE — ED Triage Notes (Signed)
Pt complains of shortness of breath and cough x 4 days. Pt is on 3-4L O2 via nasal cannula at home. Pt reports waking up from her nap out of breath today.

## 2020-12-06 ENCOUNTER — Encounter (HOSPITAL_COMMUNITY): Payer: Self-pay | Admitting: Emergency Medicine

## 2020-12-06 DIAGNOSIS — R0602 Shortness of breath: Secondary | ICD-10-CM | POA: Diagnosis not present

## 2020-12-06 LAB — TROPONIN I (HIGH SENSITIVITY): Troponin I (High Sensitivity): 15 ng/L (ref <18)

## 2020-12-06 LAB — CBC WITH DIFFERENTIAL/PLATELET
Abs Immature Granulocytes: 0.02 10*3/uL (ref 0.00–0.07)
Basophils Absolute: 0 10*3/uL (ref 0.0–0.1)
Basophils Relative: 0 %
Eosinophils Absolute: 0.1 10*3/uL (ref 0.0–0.5)
Eosinophils Relative: 1 %
HCT: 36.5 % (ref 36.0–46.0)
Hemoglobin: 12 g/dL (ref 12.0–15.0)
Immature Granulocytes: 0 %
Lymphocytes Relative: 23 %
Lymphs Abs: 2.3 10*3/uL (ref 0.7–4.0)
MCH: 30.4 pg (ref 26.0–34.0)
MCHC: 32.9 g/dL (ref 30.0–36.0)
MCV: 92.4 fL (ref 80.0–100.0)
Monocytes Absolute: 0.9 10*3/uL (ref 0.1–1.0)
Monocytes Relative: 10 %
Neutro Abs: 6.3 10*3/uL (ref 1.7–7.7)
Neutrophils Relative %: 66 %
Platelets: 148 10*3/uL — ABNORMAL LOW (ref 150–400)
RBC: 3.95 MIL/uL (ref 3.87–5.11)
RDW: 15.7 % — ABNORMAL HIGH (ref 11.5–15.5)
WBC: 9.7 10*3/uL (ref 4.0–10.5)
nRBC: 0 % (ref 0.0–0.2)

## 2020-12-06 LAB — BASIC METABOLIC PANEL
Anion gap: 7 (ref 5–15)
BUN: 21 mg/dL (ref 8–23)
CO2: 21 mmol/L — ABNORMAL LOW (ref 22–32)
Calcium: 9.1 mg/dL (ref 8.9–10.3)
Chloride: 113 mmol/L — ABNORMAL HIGH (ref 98–111)
Creatinine, Ser: 0.82 mg/dL (ref 0.44–1.00)
GFR, Estimated: >60 mL/min (ref >60)
Glucose, Bld: 89 mg/dL (ref 70–99)
Potassium: 3.9 mmol/L (ref 3.5–5.1)
Sodium: 141 mmol/L (ref 135–145)

## 2020-12-06 MED ORDER — PREDNISONE 20 MG PO TABS: ORAL_TABLET | ORAL | 0 refills | Status: DC

## 2020-12-06 MED ORDER — METHYLPREDNISOLONE SODIUM SUCC 125 MG IJ SOLR
125.0000 mg | INTRAMUSCULAR | Status: AC
  Administered 2020-12-06: 125 mg via INTRAVENOUS
  Filled 2020-12-06: qty 2

## 2020-12-06 MED ORDER — IPRATROPIUM BROMIDE 0.02 % IN SOLN
0.5000 mg | Freq: Once | RESPIRATORY_TRACT | Status: AC
  Administered 2020-12-06: 0.5 mg via RESPIRATORY_TRACT
  Filled 2020-12-06: qty 2.5

## 2020-12-06 MED ORDER — LEVALBUTEROL HCL 1.25 MG/0.5ML IN NEBU
1.2500 mg | INHALATION_SOLUTION | Freq: Once | RESPIRATORY_TRACT | Status: AC
  Administered 2020-12-06: 1.25 mg via RESPIRATORY_TRACT
  Filled 2020-12-06: qty 0.5

## 2020-12-06 NOTE — ED Provider Notes (Signed)
[facility] DEPT Provider Note   CSN: [id number] Arrival date & time: 12/05/20  2045     History Chief Complaint  Patient presents with   Shortness of Breath   Cough    Dana Horton is a 75 y.o. female.  The history is provided by the patient.  Shortness of Breath Severity:  Severe Onset quality:  Gradual Duration:  4 days Timing:  Constant Progression:  Unchanged Chronicity:  Recurrent Context: not URI   Relieved by:  Nothing Worsened by:  Nothing Ineffective treatments:  None tried Associated symptoms: cough and wheezing   Associated symptoms: no abdominal pain, no chest pain, no fever, no sore throat and no sputum production   Risk factors: no hx of PE/DVT   Cough Associated symptoms: shortness of breath and wheezing   Associated symptoms: no chest pain, no fever and no sore throat   Patietn with COPD and is wheezing.  Refuses breathing treatments as they cause AFIB.  She does not use albuterol at home for this reason.      Past Medical History:  Diagnosis Date   Allergy    Arthritis, rheumatoid (HCC)    Atrial fibrillation (HCC)    Cancer (HCC)    bladder   COPD (chronic obstructive pulmonary disease) (HCC)    Essential hypertension    Heart murmur    Lung nodule    Myocardial infarction [facility])    ? small with atrial fib    Osteoporosis    Pneumonia    hx of    Seasonal allergies    Sleep apnea    cpap  setting at 2.5 per patient    Tobacco abuse     Patient Active Problem List   Diagnosis Date Noted   Chronic anticoagulation 06/10/2019   Renal insufficiency 06/10/2019   Atrial fibrillation with rapid ventricular response ([place]) 04/10/2017   HCAP (healthcare-associated pneumonia) 04/10/2017   Elevated troponin    Essential hypertension 04/08/2017   COPD mixed type ([place]) 04/08/2017   OSA on CPAP 04/08/2017   Chronic diastolic heart failure (HCC)    Non-ST elevation (NSTEMI) myocardial infarction  ([place]) 04/07/2017   Demand ischemia ([place]) 09/25/2016   PAF (paroxysmal atrial fibrillation) ([place]) 09/24/2016   Precordial pain 10/17/2015    Past Surgical History:  Procedure Laterality Date   ABDOMINAL HYSTERECTOMY     BLADDER SURGERY     COLONOSCOPY N/A 12/28/2016   Procedure: COLONOSCOPY;  Surgeon: Doran Stabler, MD;  Location: [facility];  Service: Gastroenterology;  Laterality: N/A;   LEFT HEART CATH AND CORONARY ANGIOGRAPHY N/A 04/09/2017   Procedure: LEFT HEART CATH AND CORONARY ANGIOGRAPHY;  Surgeon: Jettie Booze, MD;  Location: [facility];  Service: Cardiovascular;  Laterality: N/A;   MOUTH SURGERY     POLYPECTOMY N/A 12/28/2016   Procedure: POLYPECTOMY with injection of [place];  Surgeon: Doran Stabler, MD;  Location: [facility];  Service: Gastroenterology;  Laterality: N/A;     OB History   No obstetric history on file.     Family History  Problem Relation Age of Onset   Hypertension Mother    Stroke Mother    Cancer Father    Heart disease Father    CAD Father    Arrhythmia Father    Bladder Cancer Brother    Lung cancer Brother     Social History   Tobacco Use   Smoking status: Former    Packs/day: 0.50  Years: 52.00    Pack years: 26.00    Types: Cigarettes    Quit date: 03/26/2017    Years since quitting: 3.7   Smokeless tobacco: Never   Tobacco comments:    1/2 pack a day  Vaping Use   Vaping Use: Never used  Substance Use Topics   Alcohol use: No   Drug use: No    Home Medications Prior to Admission medications   Medication Sig Start Date End Date Taking? Authorizing Provider  amLODipine (NORVASC) 10 MG tablet Take 1 tablet (10 mg total) by mouth daily. Patient taking differently: Take 5 mg by mouth daily. 08/25/19 11/23/19  Skeet Latch, MD  Calcium Carbonate-Vitamin D (CALTRATE 600+D PO) Take 1 tablet by mouth daily.    [provider]  cetirizine (ZYRTEC) 10 MG tablet Take 10 mg by mouth daily as  needed for allergies.    [provider]  ELIQUIS 5 MG TABS tablet TAKE 1 TABLET BY MOUTH TWICE DAILY 05/15/18   Camnitz, Ocie Doyne, MD  flecainide (TAMBOCOR) 100 MG tablet Take 1 tablet (100 mg total) by mouth 2 (two) times daily. NEEDS F/U W/DR [facility] 01/29/18   Camnitz, Ocie Doyne, MD  fluticasone (FLONASE) 50 MCG/ACT nasal spray Place 2 sprays into both nostrils daily.    [provider]  furosemide (LASIX) 20 MG tablet TAKE 1 TABLET BY MOUTH DAILY IF NEEDED FOR SHORTNESS OF BREATH OR LOWER EXTEMITY EDEMA 10/28/17   Skeet Latch, MD  metoprolol succinate (TOPROL-XL) 25 MG 24 hr tablet Take 1 tablet (25 mg total) by mouth 2 (two) times daily. Take with or immediately following a meal. 12/11/17   Camnitz, Ocie Doyne, MD  SYMBICORT 160-4.5 MCG/ACT inhaler Take 2 puffs by mouth 2 (two) times daily. 10/28/17   [provider]  valsartan (DIOVAN) 320 MG tablet Take 1 tablet (320 mg total) by mouth daily. 07/23/19   Skeet Latch, MD    Allergies    Albuterol and Lisinopril  Review of Systems   Review of Systems  Constitutional:  Negative for fever.  HENT:  Negative for congestion and sore throat.   Eyes:  Negative for redness.  Respiratory:  Positive for cough, shortness of breath and wheezing. Negative for sputum production.   Cardiovascular:  Negative for chest pain, palpitations and leg swelling.  Gastrointestinal:  Negative for abdominal pain.  Genitourinary:  Negative for difficulty urinating.  Musculoskeletal:  Negative for arthralgias.  Skin:  Negative for color change.  Neurological:  Negative for facial asymmetry.  Psychiatric/Behavioral:  Negative for agitation.   All other systems reviewed and are negative.  Physical Exam Updated Vital Signs BP (!) 120/58   Pulse 82   Temp 98.6 F (37 C) (Oral)   Resp 16   LMP  (LMP Unknown)   SpO2 96%   Physical Exam Vitals and nursing note reviewed.  Constitutional:      Appearance: Normal  appearance. She is not diaphoretic.  HENT:     Head: Normocephalic and atraumatic.     Nose: Nose normal.  Eyes:     Conjunctiva/sclera: Conjunctivae normal.     Pupils: Pupils are equal, round, and reactive to light.  Cardiovascular:     Rate and Rhythm: Normal rate and regular rhythm.     Pulses: Normal pulses.     Heart sounds: Normal heart sounds.  Pulmonary:     Effort: No respiratory distress.     Breath sounds: Wheezing present.  Abdominal:     General:  Abdomen is flat. Bowel sounds are normal.     Palpations: Abdomen is soft.     Tenderness: There is no abdominal tenderness. There is no guarding.  Musculoskeletal:        General: Normal range of motion.     Cervical back: Normal range of motion and neck supple.  Skin:    General: Skin is warm and dry.     Capillary Refill: Capillary refill takes less than 2 seconds.  Neurological:     General: No focal deficit present.     Mental Status: She is alert and oriented to person, place, and time.     Deep Tendon Reflexes: Reflexes normal.  Psychiatric:        Mood and Affect: Mood normal.        Behavior: Behavior normal.    ED Results / Procedures / Treatments   Labs (all labs ordered are listed, but only abnormal results are displayed) Results for orders placed or performed during the hospital encounter of 12/05/20  Resp Panel by RT-PCR (Flu A&B, Covid) Nasopharyngeal Swab   Specimen: Nasopharyngeal Swab; Nasopharyngeal(NP) swabs in vial transport medium  Result Value Ref Range   SARS Coronavirus 2 by RT PCR NEGATIVE NEGATIVE   Influenza A by PCR NEGATIVE NEGATIVE   Influenza B by PCR NEGATIVE NEGATIVE  CBC with Differential/Platelet  Result Value Ref Range   WBC 9.7 4.0 - 10.5 K/uL   RBC 3.95 3.87 - 5.11 MIL/uL   Hemoglobin 12.0 12.0 - 15.0 g/dL   HCT 36.5 36.0 - 46.0 %   MCV 92.4 80.0 - 100.0 fL   MCH 30.4 26.0 - 34.0 pg   MCHC 32.9 30.0 - 36.0 g/dL   RDW 15.7 (H) 11.5 - 15.5 %   Platelets 148 (L) 150 - 400  K/uL   nRBC 0.0 0.0 - 0.2 %   Neutrophils Relative % 66 %   Neutro Abs 6.3 1.7 - 7.7 K/uL   Lymphocytes Relative 23 %   Lymphs Abs 2.3 0.7 - 4.0 K/uL   Monocytes Relative 10 %   Monocytes Absolute 0.9 0.1 - 1.0 K/uL   Eosinophils Relative 1 %   Eosinophils Absolute 0.1 0.0 - 0.5 K/uL   Basophils Relative 0 %   Basophils Absolute 0.0 0.0 - 0.1 K/uL   Immature Granulocytes 0 %   Abs Immature Granulocytes 0.02 0.00 - 0.07 K/uL  Basic metabolic panel  Result Value Ref Range   Sodium 141 135 - 145 mmol/L   Potassium 3.9 3.5 - 5.1 mmol/L   Chloride 113 (H) 98 - 111 mmol/L   CO2 21 (L) 22 - 32 mmol/L   Glucose, Bld 89 70 - 99 mg/dL   BUN 21 8 - 23 mg/dL   Creatinine, Ser 0.82 0.44 - 1.00 mg/dL   Calcium 9.1 8.9 - 10.3 mg/dL   GFR, Estimated >60 >60 mL/min   Anion gap 7 5 - 15  Troponin I (High Sensitivity)  Result Value Ref Range   Troponin I (High Sensitivity) 15 <18 ng/L   DG Chest 2 View  Result Date: 12/05/2020 CLINICAL DATA:  Shortness of breath for 48 hours. EXAM: CHEST - 2 VIEW COMPARISON:  None. FINDINGS: Mild, stable diffuse, chronic appearing increased interstitial lung markings are seen. Mild atelectasis is noted within the left lung base. There is no evidence of a pleural effusion or pneumothorax. The heart size and mediastinal contours are within normal limits. There is mild calcification of the aortic arch. The visualized  skeletal structures are unremarkable. IMPRESSION: Chronic appearing increased interstitial lung markings with mild left basilar atelectasis. Electronically Signed   By: Virgina Norfolk M.D.   On: 12/05/2020 23:38    EKG  EKG Interpretation  Date/Time:  Tuesday December 06 2020 00:53:23 EDT Ventricular Rate:  71 PR Interval:  178 QRS Duration: 95 QT Interval:  427 QTC Calculation: 464 R Axis:   36 Text Interpretation: Sinus rhythm Confirmed by Dory Horn) on 12/06/2020 2:37:13 AM         Radiology DG Chest 2 View  Result Date:  12/05/2020 CLINICAL DATA:  Shortness of breath for 48 hours. EXAM: CHEST - 2 VIEW COMPARISON:  None. FINDINGS: Mild, stable diffuse, chronic appearing increased interstitial lung markings are seen. Mild atelectasis is noted within the left lung base. There is no evidence of a pleural effusion or pneumothorax. The heart size and mediastinal contours are within normal limits. There is mild calcification of the aortic arch. The visualized skeletal structures are unremarkable. IMPRESSION: Chronic appearing increased interstitial lung markings with mild left basilar atelectasis. Electronically Signed   By: Virgina Norfolk M.D.   On: 12/05/2020 23:38    Procedures Procedures   Medications Ordered in ED Medications  levalbuterol (XOPENEX) nebulizer solution 1.25 mg (1.25 mg Nebulization Given 12/06/20 0115)  ipratropium (ATROVENT) nebulizer solution 0.5 mg (0.5 mg Nebulization Given 12/06/20 0100)  methylPREDNISolone sodium succinate (SOLU-MEDROL) 125 mg/2 mL injection 125 mg (125 mg Intravenous Given 12/06/20 0106)    ED Course  I have reviewed the triage vital signs and the nursing notes.  Pertinent labs & imaging results that were available during my care of the patient were reviewed by me and considered in my medical decision making (see chart for details).  Initially refused breathing treatment as it would cause AFIB.  We had a long discussion about this improving her symptoms and she has agreed.     Air movement markedly improved post medication, she feels much better.  Ruled out based on normal EKG and normal troponin in the setting of ongoing symptoms.    Dana Horton was evaluated in Emergency Department on 12/06/2020 for the symptoms described in the history of present illness. She was evaluated in the context of the global COVID-19 pandemic, which necessitated consideration that the patient might be at risk for infection with the SARS-CoV-2 virus that causes COVID-19.  Institutional protocols and algorithms that pertain to the evaluation of patients at risk for COVID-19 are in a state of rapid change based on information released by regulatory bodies including the CDC and federal and state organizations. These policies and algorithms were followed during the patient's care in the ED.  Final Clinical Impression(s) / ED Diagnoses Final diagnoses:  None  Return for intractable cough, coughing up blood, fevers > 100.4 unrelieved by medication, shortness of breath, intractable vomiting, chest pain, shortness of breath, weakness, numbness, changes in speech, facial asymmetry, abdominal pain, passing out, Inability to tolerate liquids or food, cough, altered mental status or any concerns. No signs of systemic illness or infection. The patient is nontoxic-appearing on exam and vital signs are within normal limits. I have reviewed the triage vital signs and the nursing notes. Pertinent labs & imaging results that were available during my care of the patient were reviewed by me and considered in my medical decision making (see chart for details). After history, exam, and medical workup I feel the patient has been appropriately medically screened and is safe for discharge home. Pertinent  diagnoses were discussed with the patient. Patient was given return precautions.  Rx / DC Orders ED Discharge Orders     None        Dana Soman, MD 12/06/20 [id number]

## 2020-12-19 DIAGNOSIS — J309 Allergic rhinitis, unspecified: Secondary | ICD-10-CM | POA: Diagnosis not present

## 2020-12-19 DIAGNOSIS — R031 Nonspecific low blood-pressure reading: Secondary | ICD-10-CM | POA: Diagnosis not present

## 2020-12-19 DIAGNOSIS — J329 Chronic sinusitis, unspecified: Secondary | ICD-10-CM | POA: Diagnosis not present

## 2020-12-20 ENCOUNTER — Ambulatory Visit (HOSPITAL_BASED_OUTPATIENT_CLINIC_OR_DEPARTMENT_OTHER): Payer: Medicare Other | Admitting: Cardiovascular Disease

## 2021-02-06 DIAGNOSIS — I10 Essential (primary) hypertension: Secondary | ICD-10-CM | POA: Diagnosis not present

## 2021-02-06 DIAGNOSIS — Z23 Encounter for immunization: Secondary | ICD-10-CM | POA: Diagnosis not present

## 2021-04-24 DIAGNOSIS — J209 Acute bronchitis, unspecified: Secondary | ICD-10-CM | POA: Diagnosis not present

## 2021-04-24 DIAGNOSIS — J45909 Unspecified asthma, uncomplicated: Secondary | ICD-10-CM | POA: Diagnosis not present

## 2021-05-11 DIAGNOSIS — J069 Acute upper respiratory infection, unspecified: Secondary | ICD-10-CM | POA: Diagnosis not present

## 2021-05-16 DIAGNOSIS — I4891 Unspecified atrial fibrillation: Secondary | ICD-10-CM | POA: Diagnosis not present

## 2021-05-16 DIAGNOSIS — Z1331 Encounter for screening for depression: Secondary | ICD-10-CM | POA: Diagnosis not present

## 2021-05-16 DIAGNOSIS — M8580 Other specified disorders of bone density and structure, unspecified site: Secondary | ICD-10-CM | POA: Diagnosis not present

## 2021-05-16 DIAGNOSIS — Z87891 Personal history of nicotine dependence: Secondary | ICD-10-CM | POA: Diagnosis not present

## 2021-05-16 DIAGNOSIS — R911 Solitary pulmonary nodule: Secondary | ICD-10-CM | POA: Diagnosis not present

## 2021-05-16 DIAGNOSIS — Z6827 Body mass index (BMI) 27.0-27.9, adult: Secondary | ICD-10-CM | POA: Diagnosis not present

## 2021-05-16 DIAGNOSIS — M159 Polyosteoarthritis, unspecified: Secondary | ICD-10-CM | POA: Diagnosis not present

## 2021-05-16 DIAGNOSIS — J449 Chronic obstructive pulmonary disease, unspecified: Secondary | ICD-10-CM | POA: Diagnosis not present

## 2021-05-16 DIAGNOSIS — E7800 Pure hypercholesterolemia, unspecified: Secondary | ICD-10-CM | POA: Diagnosis not present

## 2021-05-16 DIAGNOSIS — N1831 Chronic kidney disease, stage 3a: Secondary | ICD-10-CM | POA: Diagnosis not present

## 2021-05-16 DIAGNOSIS — I10 Essential (primary) hypertension: Secondary | ICD-10-CM | POA: Diagnosis not present

## 2021-05-16 DIAGNOSIS — Z720 Tobacco use: Secondary | ICD-10-CM | POA: Diagnosis not present

## 2021-05-16 DIAGNOSIS — Z0000 Encounter for general adult medical examination without abnormal findings: Secondary | ICD-10-CM | POA: Diagnosis not present

## 2021-08-23 DIAGNOSIS — E785 Hyperlipidemia, unspecified: Secondary | ICD-10-CM | POA: Diagnosis not present

## 2021-08-23 DIAGNOSIS — I10 Essential (primary) hypertension: Secondary | ICD-10-CM | POA: Diagnosis not present

## 2021-10-16 DIAGNOSIS — I10 Essential (primary) hypertension: Secondary | ICD-10-CM | POA: Diagnosis not present

## 2021-10-16 DIAGNOSIS — J309 Allergic rhinitis, unspecified: Secondary | ICD-10-CM | POA: Diagnosis not present

## 2021-10-27 ENCOUNTER — Encounter: Payer: Self-pay | Admitting: Gastroenterology

## 2021-11-17 DIAGNOSIS — H35371 Puckering of macula, right eye: Secondary | ICD-10-CM | POA: Diagnosis not present

## 2021-11-30 ENCOUNTER — Telehealth: Payer: Self-pay

## 2021-11-30 ENCOUNTER — Encounter: Payer: Self-pay | Admitting: Gastroenterology

## 2021-11-30 ENCOUNTER — Ambulatory Visit (INDEPENDENT_AMBULATORY_CARE_PROVIDER_SITE_OTHER): Payer: Medicare Other | Admitting: Gastroenterology

## 2021-11-30 VITALS — BP 134/82 | HR 56 | Wt 155.2 lb

## 2021-11-30 DIAGNOSIS — Z86010 Personal history of colonic polyps: Secondary | ICD-10-CM | POA: Diagnosis not present

## 2021-11-30 DIAGNOSIS — Z7902 Long term (current) use of antithrombotics/antiplatelets: Secondary | ICD-10-CM

## 2021-11-30 DIAGNOSIS — K625 Hemorrhage of anus and rectum: Secondary | ICD-10-CM | POA: Diagnosis not present

## 2021-11-30 MED ORDER — NA SULFATE-K SULFATE-MG SULF 17.5-3.13-1.6 GM/177ML PO SOLN: 1.0000 | Freq: Once | ORAL | 0 refills | Status: AC

## 2021-11-30 NOTE — Telephone Encounter (Signed)
Faxed request sent to Dr Nona Dell  per Dr Loletha Carrow for Eliquis clearance. Will await return documentation

## 2021-11-30 NOTE — Patient Instructions (Addendum)
If you are age 76 or older, your body mass index should be between 23-30. Your Body mass index is 26.64 kg/m. If this is out of the aforementioned range listed, please consider follow up with your Primary Care Provider.  If you are age 26 or younger, your body mass index should be between 19-25. Your Body mass index is 26.64 kg/m. If this is out of the aformentioned range listed, please consider follow up with your Primary Care Provider.   ________________________________________________________  The [facility] providers would like to encourage you to use [facility] to communicate with providers for non-urgent requests or questions.  Due to long hold times on the telephone, sending your provider a message by [facility] may be a faster and more efficient way to get a response.  Please allow 48 business hours for a response.  Please remember that this is for non-urgent requests.  _______________________________________________________   Dennis Bast have been scheduled for a colonoscopy. Please follow written instructions given to you at your visit today.  Please pick up your prep supplies at the pharmacy within the next 1-3 days. If you use inhalers (even only as needed), please bring them with you on the day of your procedure.   Due to recent changes in healthcare laws, you may see the results of your imaging and laboratory studies on MyChart before your provider has had a chance to review them.  We understand that in some cases there may be results that are confusing or concerning to you. Not all laboratory results come back in the same time frame and the provider may be waiting for multiple results in order to interpret others.  Please give [place] 48 hours in order for your provider to thoroughly review all the results before contacting the office for clarification of your results.   You will be contacted by our office prior to your procedure for directions on holding your Eliquis.  If you do not hear from our  office 1 week prior to your scheduled procedure, please call [phone] to discuss.    It was a pleasure to see you today!  Thank you for trusting me with your gastrointestinal care!

## 2021-11-30 NOTE — Progress Notes (Signed)
[facility] Consult Note:  History: Dana Horton 11/30/2021  Referring provider: Townsend Roger, MD  Reason for consult/chief complaint: Colonoscopy (Discuss colon)   Subjective  HPI: Dana Horton is here for medical evaluation prior to consideration of surveillance colonoscopy.  I saw her in 2018 for rectal bleeding described as either bright red blood or black stool.  She underwent EGD and colonoscopy in our office based endoscopy lab August 2018.  EGD with no source of bleeding.  Colon with multiple polyps, 1 of which in the right colon could not be completely resected due to some technical challenges.  She had a repeat colonoscopy in the hospital October 2018 with removal of an ascending colon SSP and sigmoid hyperplastic polyp.  She was scheduled for repeat colonoscopy in 2022 but canceled because she was caring for her sick husband.  Dana Horton says she is generally doing well from a digestive standpoint.  She recently started pravastatin and felt that it caused a flare of hemorrhoids with burning itching and bleeding, so she stopped the medication and the hemorrhoids improved.  Bowel habits are regular without straining, and she denies chronic abdominal pain.  She denies chronic upper digestive symptoms.   ROS:  Review of Systems  Constitutional:  Negative for appetite change and unexpected weight change.  HENT:  Negative for mouth sores and voice change.   Eyes:  Negative for pain and redness.  Respiratory:  Negative for cough.   Cardiovascular:  Negative for chest pain and palpitations.  Genitourinary:  Negative for dysuria and hematuria.  Musculoskeletal:  Positive for arthralgias and back pain. Negative for myalgias.  Skin:  Negative for pallor and rash.  Allergic/Immunologic: Positive for environmental allergies.  Neurological:  Negative for weakness and headaches.  Hematological:  Negative for adenopathy.     Past Medical History: Past  Medical History:  Diagnosis Date   Allergy    Arthritis, rheumatoid (HCC)    Atrial fibrillation (HCC)    Cancer (HCC)    bladder   CHF (congestive heart failure) (HCC)    CKD (chronic kidney disease) stage 3, GFR 30-59 ml/min (HCC)    COPD (chronic obstructive pulmonary disease) (HCC)    Essential hypertension    Heart murmur    Lung nodule    Myocardial infarction ([place])    ? small with atrial fib    Osteoporosis    Pneumonia    hx of    Seasonal allergies    Sleep apnea    cpap  setting at 2.5 per patient    Tobacco abuse    Last cardiology telemedicine with Dr Oval Linsey Dec 2021  Past Surgical History: Past Surgical History:  Procedure Laterality Date   ABDOMINAL HYSTERECTOMY     BLADDER SURGERY     COLONOSCOPY N/A 12/28/2016   Procedure: COLONOSCOPY;  Surgeon: Doran Stabler, MD;  Location: [facility];  Service: Gastroenterology;  Laterality: N/A;   LEFT HEART CATH AND CORONARY ANGIOGRAPHY N/A 04/09/2017   Procedure: LEFT HEART CATH AND CORONARY ANGIOGRAPHY;  Surgeon: Jettie Booze, MD;  Location: [facility];  Service: Cardiovascular;  Laterality: N/A;   MOUTH SURGERY     POLYPECTOMY N/A 12/28/2016   Procedure: POLYPECTOMY with injection of [place];  Surgeon: Doran Stabler, MD;  Location: [facility];  Service: Gastroenterology;  Laterality: N/A;     Family History: Family History  Problem Relation Age of Onset   Hypertension Mother    Stroke Mother  Cancer Father    Heart disease Father    CAD Father    Arrhythmia Father    Bladder Cancer Brother    Lung cancer Brother    Colon cancer Neg Hx    Esophageal cancer Neg Hx    Stomach cancer Neg Hx    Colon polyps Neg Hx     Social History: Social History   Socioeconomic History   Marital status: Married    Spouse name: Not on file   Number of children: 3   Years of education: Not on file   Highest education level: Not on file  Occupational History   Occupation: Retired   Tobacco Use   Smoking status: Every Day    Packs/day: 0.50    Years: 52.00    Total pack years: 26.00    Types: Cigarettes    Last attempt to quit: 03/26/2017    Years since quitting: 4.6   Smokeless tobacco: Never   Tobacco comments:    1/2 pack a day  Vaping Use   Vaping Use: Never used  Substance and Sexual Activity   Alcohol use: No   Drug use: No   Sexual activity: Not on file  Other Topics Concern   Not on file  Social History Narrative   Not on file   Social Determinants of Health   Financial Resource Strain: Not on file  Food Insecurity: Not on file  Transportation Needs: Not on file  Physical Activity: Not on file  Stress: Not on file  Social Connections: Not on file    Allergies: Allergies  Allergen Reactions   Albuterol Palpitations   Lisinopril Cough    Outpatient Meds: Current Outpatient Medications  Medication Sig Dispense Refill   ELIQUIS 5 MG TABS tablet TAKE 1 TABLET BY MOUTH TWICE DAILY 60 tablet 5   flecainide (TAMBOCOR) 100 MG tablet Take 1 tablet (100 mg total) by mouth 2 (two) times daily. NEEDS F/U W/DR [place] 180 tablet 0   hydrALAZINE (APRESOLINE) 25 MG tablet Take 25 mg by mouth 2 (two) times daily.     metoprolol succinate (TOPROL-XL) 25 MG 24 hr tablet Take 1 tablet (25 mg total) by mouth 2 (two) times daily. Take with or immediately following a meal. 90 tablet 3   Na Sulfate-K Sulfate-Mg Sulf 17.5-3.13-1.6 GM/177ML SOLN Take 1 kit by mouth once for 1 dose. 354 mL 0   pravastatin (PRAVACHOL) 80 MG tablet Take 80 mg by mouth at bedtime.     valsartan (DIOVAN) 320 MG tablet Take 1 tablet (320 mg total) by mouth daily. 90 tablet 3   amLODipine (NORVASC) 10 MG tablet Take 1 tablet (10 mg total) by mouth daily. (Patient not taking: Reported on 11/30/2021) 90 tablet 3   Calcium Carbonate-Vitamin D (CALTRATE 600+D PO) Take 1 tablet by mouth daily. (Patient not taking: Reported on 11/30/2021)     cetirizine (ZYRTEC) 10 MG tablet Take 10 mg by mouth  daily as needed for allergies. (Patient not taking: Reported on 11/30/2021)     fluticasone (FLONASE) 50 MCG/ACT nasal spray Place 2 sprays into both nostrils daily. (Patient not taking: Reported on 11/30/2021)     furosemide (LASIX) 20 MG tablet TAKE 1 TABLET BY MOUTH DAILY IF NEEDED FOR SHORTNESS OF BREATH OR LOWER EXTEMITY EDEMA (Patient not taking: Reported on 11/30/2021) 90 tablet 3   predniSONE (DELTASONE) 20 MG tablet 3 tabs po day one, then 2 po daily x 4 days (Patient not taking: Reported on 11/30/2021) 11 tablet  0   SYMBICORT 160-4.5 MCG/ACT inhaler Take 2 puffs by mouth 2 (two) times daily. (Patient not taking: Reported on 11/30/2021)  3   No current facility-administered medications for this visit.      ___________________________________________________________________ Objective   Exam:  BP 134/82   Pulse (!) 56   Wt 155 lb 3.2 oz (70.4 kg)   LMP  (LMP Unknown)   SpO2 97%   BMI 26.64 kg/m  Wt Readings from Last 3 Encounters:  11/30/21 155 lb 3.2 oz (70.4 kg)  03/10/20 160 lb (72.6 kg)  10/28/19 163 lb (73.9 kg)    General: Well-appearing.  Breathing comfortably on room air, conversational with normal vocal quality Eyes: sclera anicteric, no redness ENT: oral mucosa moist without lesions, no cervical or supraclavicular lymphadenopathy CV: Regular with a soft systolic murmur, no JVD, no peripheral edema Resp: clear to auscultation bilaterally, normal RR and effort noted GI: soft, no tenderness, with active bowel sounds. No guarding or palpable organomegaly noted. Skin; warm and dry, no rash or jaundice noted Neuro: awake, alert and oriented x 3. Normal gross motor function and fluent speech  Data:  Last echocardiogram on file from 2018 showing normal LVEF and aortic sclerosis  CBC and CMP nml with PCP Feb 2023  Assessment: Encounter Diagnoses  Name Primary?   Personal history of colonic polyps Yes   Long term (current) use of antithrombotics/antiplatelets    Rectal  bleeding   Recent rectal bleeding that sounds likely hemorrhoidal clear however if it was triggered by statin medication.  Recommended she reconsider resuming a statin medication under the direction of her cardiologist or primary care provider.  History of serrated colon polyps, due for surveillance colonoscopy.  She was agreeable after discussion of procedure and risks.  The benefits and risks of the planned procedure were described in detail with the patient or (when appropriate) their health care proxy.  Risks were outlined as including, but not limited to, bleeding, infection, perforation, adverse medication reaction leading to cardiac or pulmonary decompensation, pancreatitis (if ERCP).  The limitation of incomplete mucosal visualization was also discussed.  No guarantees or warranties were given.  Patient at increased risk for cardiopulmonary complications of procedure due to medical comorbidities.  Hold Eliquis 36 hours before procedure SCDs last dose 2 evenings prior to procedure).  She is aware of the small real risk of stroke or other clot occurring during this brief hold of the anticoagulation.  We will clear this with her primary care provider to be sure, since they have been prescribing the medication.  If they defer to her cardiologist Dr. Oval Linsey (who she last saw by telemedicine in December 2021), then we will go that route.    Thank you for the courtesy of this consult.  Please call me with any questions or concerns.  [name]  CC: Referring provider noted above

## 2021-12-14 NOTE — Telephone Encounter (Signed)
Office is closed on Thursdays, will call back on Friday

## 2021-12-15 NOTE — Telephone Encounter (Signed)
I spoke to [place] at Dr Doran Durand office. She will get a response to [place] by the end of the day

## 2021-12-21 DIAGNOSIS — H04123 Dry eye syndrome of bilateral lacrimal glands: Secondary | ICD-10-CM | POA: Diagnosis not present

## 2021-12-21 DIAGNOSIS — H35371 Puckering of macula, right eye: Secondary | ICD-10-CM | POA: Diagnosis not present

## 2021-12-21 DIAGNOSIS — H35341 Macular cyst, hole, or pseudohole, right eye: Secondary | ICD-10-CM | POA: Diagnosis not present

## 2021-12-25 DIAGNOSIS — R351 Nocturia: Secondary | ICD-10-CM | POA: Diagnosis not present

## 2021-12-25 DIAGNOSIS — C672 Malignant neoplasm of lateral wall of bladder: Secondary | ICD-10-CM | POA: Diagnosis not present

## 2021-12-28 NOTE — Telephone Encounter (Signed)
Communication from DR Shon Baton OK to hold 2 days before the procedure.  Dana Horton is having eye surgery(cleaning her lens) and debris removal. She stats they are using sedation for this and is concerned about having sedation again for her colonoscopy. Her eye procedure is on 10-10 and her colonoscopy is scheduled for 01-09-2022

## 2021-12-28 NOTE — Telephone Encounter (Signed)
I think she will be totally fine for both procedures as scheduled.  However, if she would feel more comfortable moving the colonoscopy to a later date, that is OK with me.  - HD

## 2021-12-29 NOTE — Telephone Encounter (Signed)
Patient wishes to keep her procedure as scheduled.

## 2022-01-02 DIAGNOSIS — H35371 Puckering of macula, right eye: Secondary | ICD-10-CM | POA: Diagnosis not present

## 2022-01-02 DIAGNOSIS — H35341 Macular cyst, hole, or pseudohole, right eye: Secondary | ICD-10-CM | POA: Diagnosis not present

## 2022-01-04 IMAGING — CR DG CHEST 2V
2 series · 2 of 2 positions shown · non-contrast
Comparison: None.

CLINICAL DATA: Shortness of breath for 48 hours.

EXAM:
CHEST - 2 VIEW

[w chest pa]
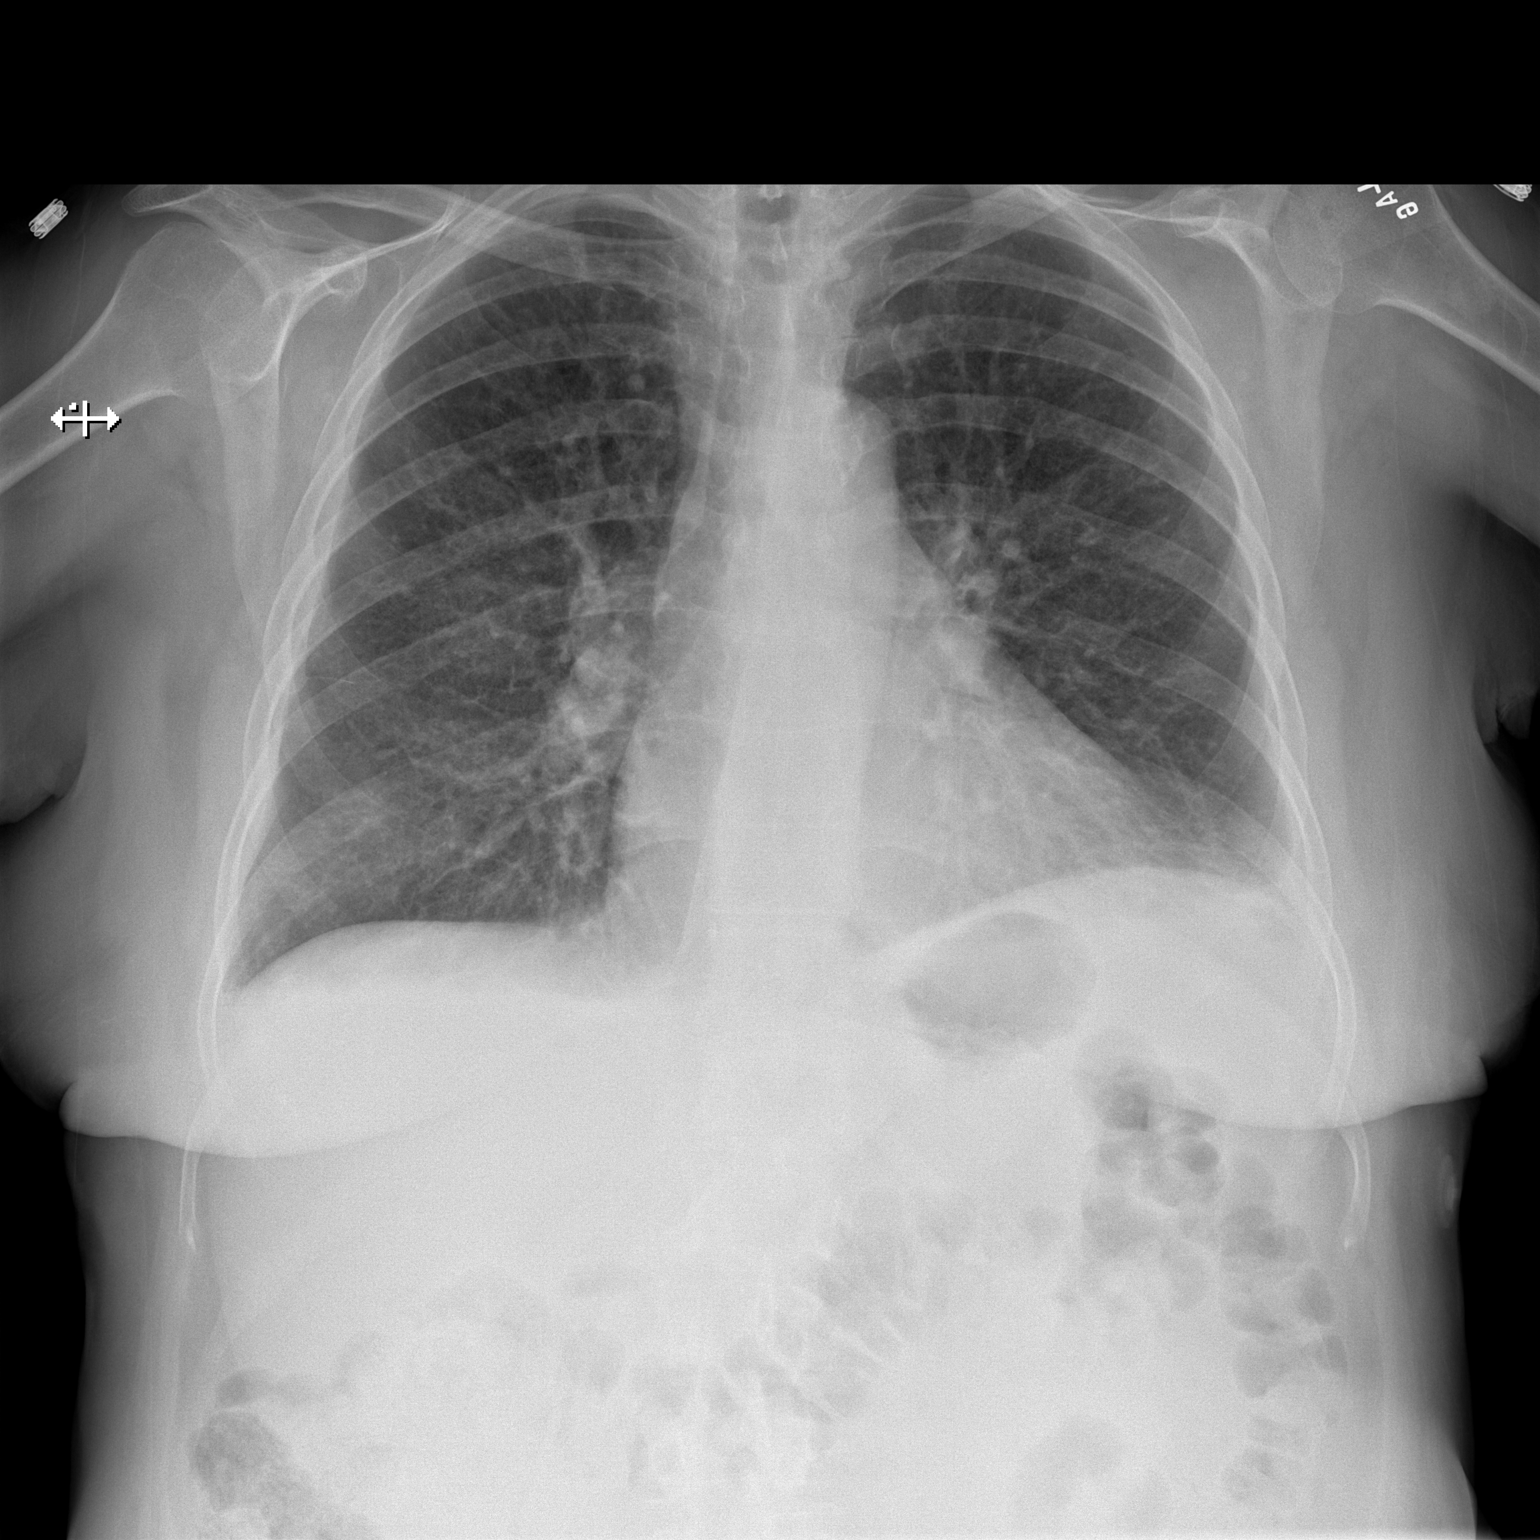

[w chest lat]
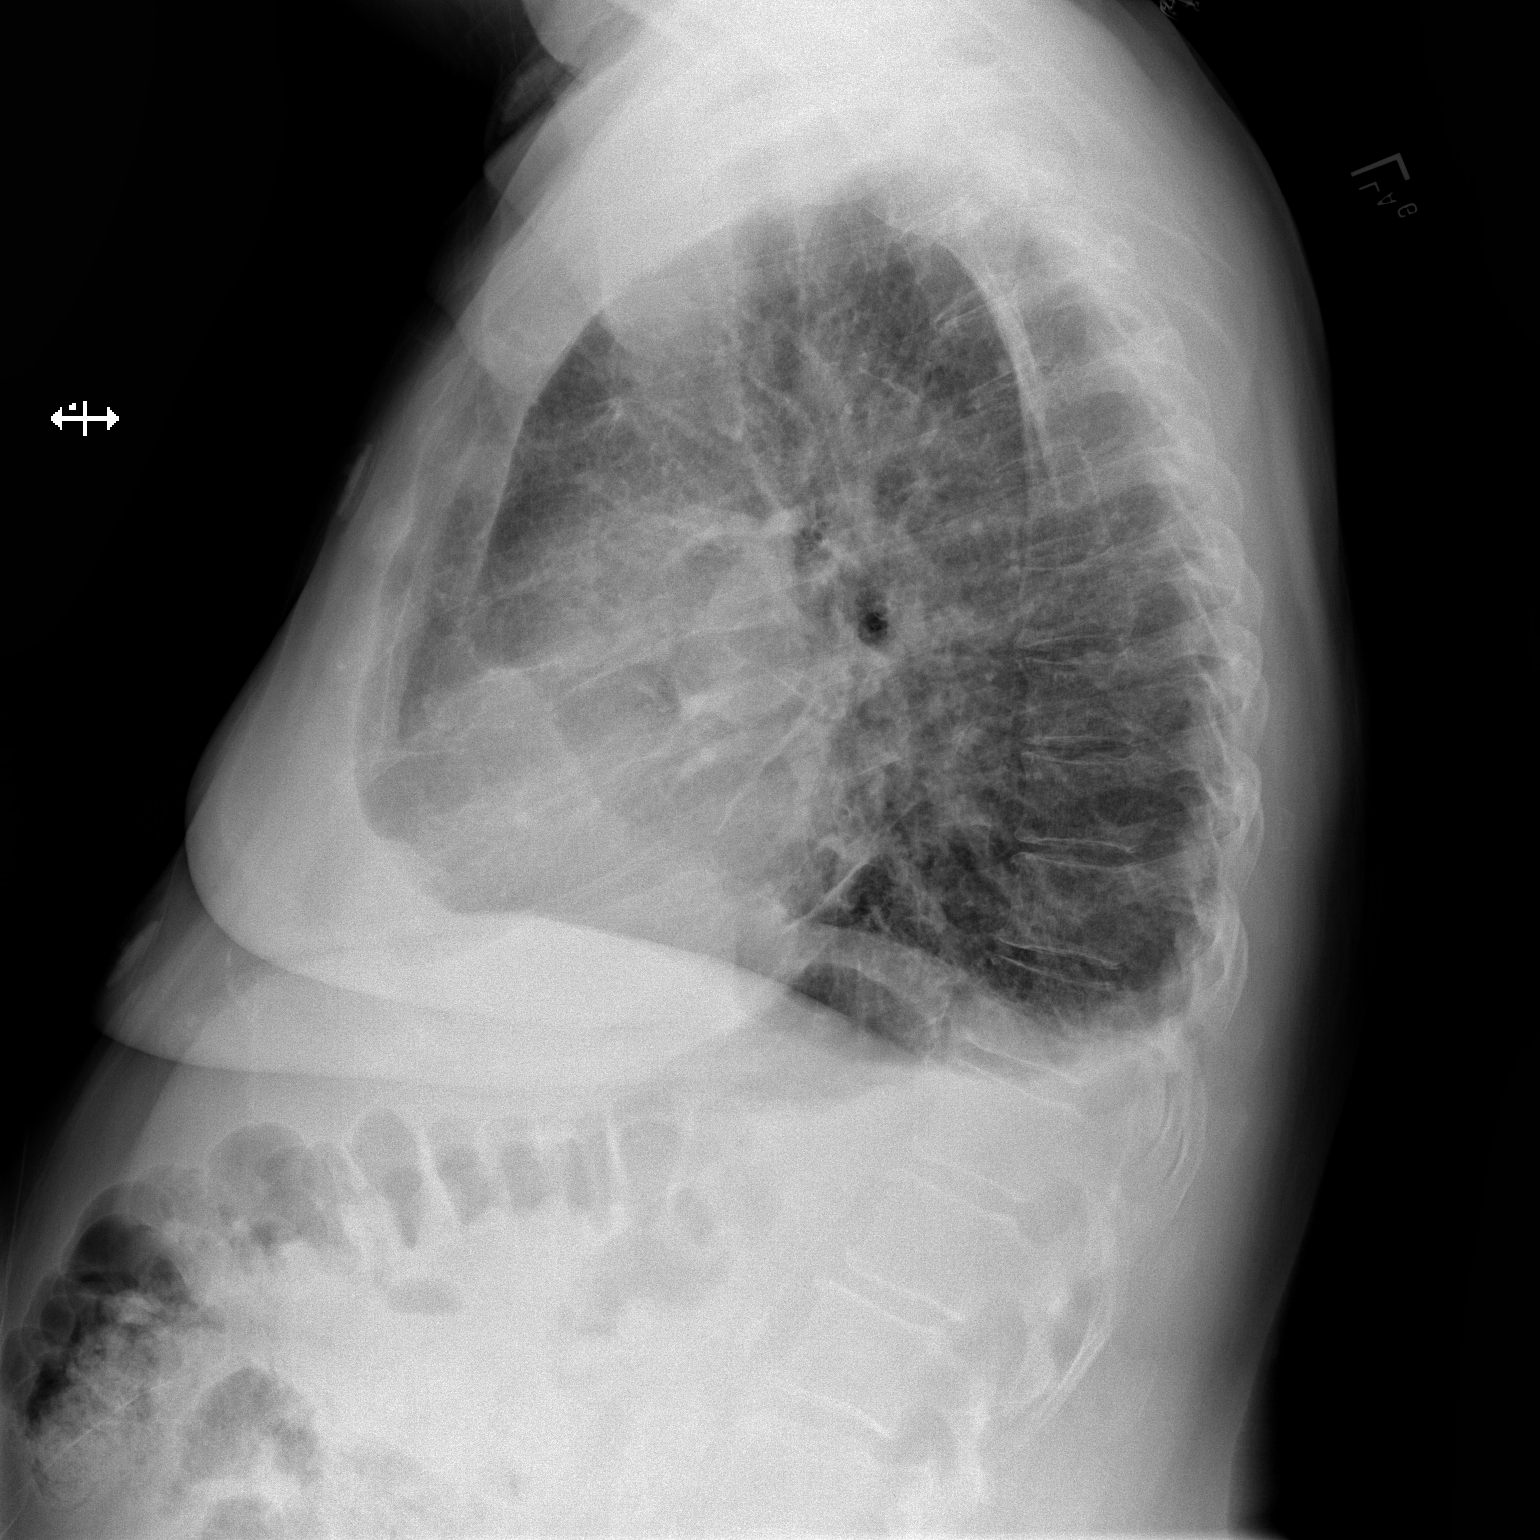

[2 of 2 positions shown; findings below may reference images not displayed]

FINDINGS: Mild, stable diffuse, chronic appearing increased interstitial lung
markings are seen. Mild atelectasis is noted within the left lung
base. There is no evidence of a pleural effusion or pneumothorax.
The heart size and mediastinal contours are within normal limits.
There is mild calcification of the aortic arch. The visualized
skeletal structures are unremarkable.
IMPRESSION: Chronic appearing increased interstitial lung markings with mild
left basilar atelectasis.

## 2022-01-09 ENCOUNTER — Encounter: Payer: Medicare Other | Admitting: Gastroenterology

## 2022-01-10 DIAGNOSIS — Z23 Encounter for immunization: Secondary | ICD-10-CM | POA: Diagnosis not present

## 2022-01-10 DIAGNOSIS — I10 Essential (primary) hypertension: Secondary | ICD-10-CM | POA: Diagnosis not present

## 2022-01-10 DIAGNOSIS — E7800 Pure hypercholesterolemia, unspecified: Secondary | ICD-10-CM | POA: Diagnosis not present

## 2022-01-12 DIAGNOSIS — U071 COVID-19: Secondary | ICD-10-CM | POA: Diagnosis not present

## 2022-02-13 ENCOUNTER — Other Ambulatory Visit: Payer: Self-pay

## 2022-02-13 MED ORDER — FLECAINIDE ACETATE 100 MG PO TABS: 100.0000 mg | ORAL_TABLET | Freq: Two times a day (BID) | ORAL | 0 refills | Status: DC

## 2022-02-13 MED ORDER — METOPROLOL SUCCINATE ER 25 MG PO TB24: 25.0000 mg | ORAL_TABLET | Freq: Two times a day (BID) | ORAL | 3 refills | Status: DC

## 2022-02-19 ENCOUNTER — Other Ambulatory Visit: Payer: Self-pay

## 2022-02-19 MED ORDER — METOPROLOL SUCCINATE ER 25 MG PO TB24: 25.0000 mg | ORAL_TABLET | Freq: Two times a day (BID) | ORAL | 1 refills | Status: DC

## 2022-02-19 MED ORDER — FLECAINIDE ACETATE 100 MG PO TABS: 100.0000 mg | ORAL_TABLET | Freq: Two times a day (BID) | ORAL | 1 refills | Status: DC

## 2022-02-22 DIAGNOSIS — H35341 Macular cyst, hole, or pseudohole, right eye: Secondary | ICD-10-CM | POA: Diagnosis not present

## 2022-02-27 ENCOUNTER — Encounter: Payer: Medicare Other | Admitting: Gastroenterology

## 2022-03-07 ENCOUNTER — Ambulatory Visit: Payer: Medicare Other | Admitting: Internal Medicine

## 2022-03-07 ENCOUNTER — Encounter: Payer: Self-pay | Admitting: Internal Medicine

## 2022-03-07 VITALS — BP 148/70 | HR 70 | Temp 98.0°F | Resp 16 | Ht 63.0 in | Wt 158.1 lb

## 2022-03-07 DIAGNOSIS — J441 Chronic obstructive pulmonary disease with (acute) exacerbation: Secondary | ICD-10-CM

## 2022-03-07 DIAGNOSIS — R059 Cough, unspecified: Secondary | ICD-10-CM

## 2022-03-07 DIAGNOSIS — J40 Bronchitis, not specified as acute or chronic: Secondary | ICD-10-CM

## 2022-03-07 HISTORY — DX: Bronchitis, not specified as acute or chronic: J40

## 2022-03-07 MED ORDER — FLUTICASONE PROPIONATE 50 MCG/ACT NA SUSP: 2.0000 | Freq: Every day | NASAL | 0 refills | Status: DC

## 2022-03-07 MED ORDER — DOXYCYCLINE MONOHYDRATE 100 MG PO CAPS: 100.0000 mg | ORAL_CAPSULE | Freq: Two times a day (BID) | ORAL | 0 refills | Status: DC

## 2022-03-07 MED ORDER — PREDNISONE 10 MG PO TABS: ORAL_TABLET | ORAL | 0 refills | Status: DC

## 2022-03-07 NOTE — Progress Notes (Signed)
Office Visit  Subjective   Patient ID: Dana Horton   DOB: [birth year]   Age: 76 y.o.   MRN: [id number]   Chief Complaint Chief Complaint  Patient presents with   Acute Visit    Cough, headache     History of Present Illness The patient is a 76 year old Caucasian/White female who presents with upper respiratory tract symptoms which began 5 days ago. She complains of sinus congestion with clear nasal discharge with chest congestion with cough productive of yellow mucus.  She is also having some headaches as well as some SOB and wheezing where she is having to sit up to sleep at night.  She did have a COVID-19 about 6 weeks ago. The patient has had the 2 original Moderna vaccines but no boosters.. She denies s nausea, vomiting, diarrhea, wheezing, and chills. Alleviating factors: robitussin and ibuprofent. The patient's past medical history is notable for a history of allergies and chronic obstructive pulmonary disease.  She has been using albuterol neb twice a day over the last few days where she was not using any nebs before that.      Past Medical History Past Medical History:  Diagnosis Date   Allergy    Arthritis, rheumatoid (HCC)    Atrial fibrillation (HCC)    Cancer (HCC)    bladder   CHF (congestive heart failure) (HCC)    CKD (chronic kidney disease) stage 3, GFR 30-59 ml/min (HCC)    COPD (chronic obstructive pulmonary disease) (HCC)    Essential hypertension    Heart murmur    Lung nodule    Myocardial infarction ([place])    ? small with atrial fib    Osteoporosis    Pneumonia    hx of    Seasonal allergies    Sleep apnea    cpap  setting at 2.5 per patient    Tobacco abuse      Allergies Allergies  Allergen Reactions   Albuterol Palpitations   Lisinopril Cough     Review of Systems Review of Systems  Constitutional:  Negative for chills and fever.  Respiratory:  Positive for cough, sputum production, shortness of breath and wheezing.    Cardiovascular:  Negative for chest pain, palpitations and leg swelling.  Gastrointestinal:  Negative for constipation, diarrhea, nausea and vomiting.  Musculoskeletal:  Negative for myalgias.  Skin:  Negative for itching and rash.  Neurological:  Positive for headaches. Negative for dizziness and weakness.       Objective:    Vitals BP (!) 148/70   Pulse 70   Temp 98 F (36.7 C)   Resp 16   Ht '5\' 3"'$  (1.6 m)   Wt 158 lb 0.8 oz (71.7 kg)   LMP  (LMP Unknown)   SpO2 91%   BMI 28.00 kg/m    Physical Examination Physical Exam Constitutional:      Appearance: Normal appearance. She is not ill-appearing.  Cardiovascular:     Rate and Rhythm: Normal rate and regular rhythm.     Pulses: Normal pulses.     Heart sounds: No murmur heard.    No friction rub. No gallop.  Pulmonary:     Effort: Pulmonary effort is normal. No respiratory distress.     Breath sounds: Wheezing present. No rhonchi or rales.  Abdominal:     General: Abdomen is flat. Bowel sounds are normal. There is no distension.     Palpations: Abdomen is soft.     Tenderness: There  is no abdominal tenderness.  Musculoskeletal:     Right lower leg: No edema.     Left lower leg: No edema.  Skin:    General: Skin is warm and dry.     Findings: No rash.  Neurological:     Mental Status: She is alert.       Assessment & Plan:   COPD exacerbation ([place]) The patient has a COPD exacerbation today.  We did a COVID and flu antigen testing which was negative.  We gave her rocephin 1g IM x 1 and solumedrol '80mg'$  IM x 1 as well as a duoneb treatment which did help with her wheezing.  I am going to start her on a prednsione taper and give her doxycycline at this time.  Continue her albuterol nebs q 6 hrs for next few days.  Continue supportive care.    No follow-ups on file.   Townsend Roger, MD

## 2022-03-07 NOTE — Assessment & Plan Note (Signed)
The patient has a COPD exacerbation today.  We did a COVID and flu antigen testing which was negative.  We gave her rocephin 1g IM x 1 and solumedrol '80mg'$  IM x 1 as well as a duoneb treatment which did help with her wheezing.  I am going to start her on a prednsione taper and give her doxycycline at this time.  Continue her albuterol nebs q 6 hrs for next few days.  Continue supportive care.

## 2022-05-15 ENCOUNTER — Other Ambulatory Visit: Payer: Self-pay | Admitting: Internal Medicine

## 2022-05-17 DIAGNOSIS — H35371 Puckering of macula, right eye: Secondary | ICD-10-CM | POA: Diagnosis not present

## 2022-05-17 DIAGNOSIS — H04123 Dry eye syndrome of bilateral lacrimal glands: Secondary | ICD-10-CM | POA: Diagnosis not present

## 2022-05-17 DIAGNOSIS — H35341 Macular cyst, hole, or pseudohole, right eye: Secondary | ICD-10-CM | POA: Diagnosis not present

## 2022-05-21 ENCOUNTER — Encounter: Payer: Self-pay | Admitting: Internal Medicine

## 2022-05-21 ENCOUNTER — Ambulatory Visit: Payer: Medicare Other | Admitting: Internal Medicine

## 2022-05-21 VITALS — BP 168/70 | HR 68 | Temp 97.5°F | Resp 16 | Ht 63.0 in | Wt 160.4 lb

## 2022-05-21 DIAGNOSIS — E7800 Pure hypercholesterolemia, unspecified: Secondary | ICD-10-CM

## 2022-05-21 DIAGNOSIS — M810 Age-related osteoporosis without current pathological fracture: Secondary | ICD-10-CM | POA: Diagnosis not present

## 2022-05-21 DIAGNOSIS — Z8551 Personal history of malignant neoplasm of bladder: Secondary | ICD-10-CM

## 2022-05-21 DIAGNOSIS — F17210 Nicotine dependence, cigarettes, uncomplicated: Secondary | ICD-10-CM

## 2022-05-21 DIAGNOSIS — I480 Paroxysmal atrial fibrillation: Secondary | ICD-10-CM

## 2022-05-21 DIAGNOSIS — Z87891 Personal history of nicotine dependence: Secondary | ICD-10-CM | POA: Diagnosis not present

## 2022-05-21 DIAGNOSIS — G4730 Sleep apnea, unspecified: Secondary | ICD-10-CM | POA: Diagnosis not present

## 2022-05-21 DIAGNOSIS — M8580 Other specified disorders of bone density and structure, unspecified site: Secondary | ICD-10-CM

## 2022-05-21 DIAGNOSIS — M159 Polyosteoarthritis, unspecified: Secondary | ICD-10-CM | POA: Diagnosis not present

## 2022-05-21 DIAGNOSIS — J302 Other seasonal allergic rhinitis: Secondary | ICD-10-CM | POA: Diagnosis not present

## 2022-05-21 DIAGNOSIS — Z0000 Encounter for general adult medical examination without abnormal findings: Secondary | ICD-10-CM | POA: Diagnosis not present

## 2022-05-21 DIAGNOSIS — J449 Chronic obstructive pulmonary disease, unspecified: Secondary | ICD-10-CM

## 2022-05-21 DIAGNOSIS — I10 Essential (primary) hypertension: Secondary | ICD-10-CM | POA: Diagnosis not present

## 2022-05-21 DIAGNOSIS — N1831 Chronic kidney disease, stage 3a: Secondary | ICD-10-CM | POA: Insufficient documentation

## 2022-05-21 DIAGNOSIS — Z1231 Encounter for screening mammogram for malignant neoplasm of breast: Secondary | ICD-10-CM

## 2022-05-21 DIAGNOSIS — Z6828 Body mass index (BMI) 28.0-28.9, adult: Secondary | ICD-10-CM | POA: Diagnosis not present

## 2022-05-21 DIAGNOSIS — K635 Polyp of colon: Secondary | ICD-10-CM

## 2022-05-21 HISTORY — DX: Chronic obstructive pulmonary disease, unspecified: J44.9

## 2022-05-21 HISTORY — DX: Chronic kidney disease, stage 3a: N18.31

## 2022-05-21 HISTORY — DX: Body mass index (BMI) 28.0-28.9, adult: Z68.28

## 2022-05-21 HISTORY — DX: Polyosteoarthritis, unspecified: M15.9

## 2022-05-21 HISTORY — DX: Other specified disorders of bone density and structure, unspecified site: M85.80

## 2022-05-21 HISTORY — DX: Pure hypercholesterolemia, unspecified: E78.00

## 2022-05-21 HISTORY — DX: Nicotine dependence, cigarettes, uncomplicated: F17.210

## 2022-05-21 MED ORDER — SYMBICORT 160-4.5 MCG/ACT IN AERO: 2.0000 | INHALATION_SPRAY | Freq: Two times a day (BID) | RESPIRATORY_TRACT | 3 refills | Status: DC

## 2022-05-21 MED ORDER — AREXVY 120 MCG/0.5ML IM SUSR: 0.5000 mL | Freq: Once | INTRAMUSCULAR | 0 refills | Status: AC

## 2022-05-21 MED ORDER — HYDRALAZINE HCL 50 MG PO TABS: 50.0000 mg | ORAL_TABLET | Freq: Two times a day (BID) | ORAL | 3 refills | Status: DC

## 2022-05-21 MED ORDER — LEVALBUTEROL TARTRATE 45 MCG/ACT IN AERO: 1.0000 | INHALATION_SPRAY | RESPIRATORY_TRACT | 12 refills | Status: DC | PRN

## 2022-05-21 MED ORDER — ROSUVASTATIN CALCIUM 5 MG PO TABS: 5.0000 mg | ORAL_TABLET | ORAL | 3 refills | Status: DC

## 2022-05-21 NOTE — Assessment & Plan Note (Signed)
We had a discussion about smoking cessation.  I want her to cut back on her smoking.

## 2022-05-21 NOTE — Assessment & Plan Note (Signed)
She has CKD where we will check her urine studes and renal function today.  She needs to avoid NSAIDS.

## 2022-05-21 NOTE — Assessment & Plan Note (Signed)
She received 3 courses of iv reclast.  We will repeat a bone density this year.

## 2022-05-21 NOTE — Assessment & Plan Note (Signed)
She can continue on tylenol as needed for arthritic pain.

## 2022-05-21 NOTE — Assessment & Plan Note (Signed)
She has an appointment coming up this year to followup with urology.

## 2022-05-21 NOTE — Assessment & Plan Note (Signed)
Continue flonase and zyrtec as needed.

## 2022-05-21 NOTE — Assessment & Plan Note (Signed)
She did not tolerate lipitor.  I am going to try her on crestor '5mg'$  every other day.  We will check her FLP today.

## 2022-05-21 NOTE — Assessment & Plan Note (Addendum)
Her BP is elevated today.  She has taken her BP meds this morning.  We will see what her BP is running on her next visit as her BP looked ok last year.  She has not seen a cardiologist since 2021 and we will get her setup.

## 2022-05-21 NOTE — Assessment & Plan Note (Signed)
She is currently rate controlled where she is on metoprolol and flecainide.  She remains anticoagulated on eliquis at this time.

## 2022-05-21 NOTE — Assessment & Plan Note (Signed)
She has REM related sleep apnea where she is compliant on CPAP at this time.

## 2022-05-21 NOTE — Assessment & Plan Note (Signed)
She is having some mild wheezing today.  She has no other symptoms.  I want her to take the symbicort regularly and I have refilled her rescue inhaler.

## 2022-05-21 NOTE — Assessment & Plan Note (Signed)
She needs to eat healthly and exercise more and lose weight.

## 2022-05-21 NOTE — Progress Notes (Signed)
Preventive Screening-Counseling & Management     Dana Horton is a 77 y.o. female who presents for Medicare Annual/Subsequent preventive examination.  Dana Horton is a 77 year old Caucasian/White female who presents for her annual wellness exam.  This patient's past medical history Allergies, Atrial Fibrillation, Chronic kidney disease, Stage III (moderate), COPD, Hypertension, Lung Nodule, Obstructive Sleep Apnea, Osteoarthritis, Osteopenia, and Tobacco Abuse.   Her last eye exam was on 05/18/2022 where she had cataract surgery in 2023.  Today, she states her vision is doing well. She had hematochezia in 2018 after starting on Eliquis and was sent to GI for workup. She had a colonoscopy and EGD performed on 10/2016 and this showed a normal EGD but she did have some polyps on colonoscopy. They want to repeat her colonoscopy in 3 years but she did not have this done in 2021.  We sent her again last year to GI whom she saw but her husband got sick and she had to cancel the colonoscopy.   She had a digital screening mammogram done on 08/11/2020 and this was normal.  I referred her for another mammogram in 2023 but she never went.   She has had a partial hystrectomy. The patient was diagnosed with bladder cancer in 2017 where they noted a mass on her bladder on CT scan. She underwent resection with TURBT and did not require chemo, radiation or BCG infusions. Urology has been following her for survelliance where she last saw urology in 12/2021 and they did a cystoscopy which was normal.  They planned to do yearly cystoscopy for surveillance. She denies any problems with urination today. She is not exercising regularly. The patient does get yearly flu vaccines. She had a pneumovax 23 vaccine 12/2016. She had the prevnar 13 vaccine and zostavax vaccine in 2016. She did have a shingrix vaccine in 04/2018. She has had 2 COVID-19 vaccines but no boosters.   She is interested in the RSV  vaccine.  There is no depression, anxiety nor any memory loss. The patient is on eliquis '5mg'$  BID  Today, she has a "rough" lesion located on her left cheek.  This site is itchy and irritated at times and is dry and flakes.  This lesion started about 1 year ago.  The patient is a 77 year old Caucasian/White female who presents for a follow-up evaluation of hypertension.  This past year, her BP was elevated and I increased her hydralazine from '25mg'$  BID to '50mg'$  BID.  This past year the patient was acutely sick and she was having low BP's with symptoms of dizziness and generalized weakness.  She stopped the hydralazine and her symptoms went away after she recovered.  The patient restarted her hydralazine '25mg'$  BID afterwards and she tolerated that well.  The patient has been checking her blood pressure at home.  She states her systolic blood pressure has been running in the 120's range.  The patient's current medications include: metoprolol succinate 25 mg BID, valsartan '320mg'$  daily, hydralazine '50mg'$  BID.  The patient has been tolerating her medications well. The patient denies any visual changes, dizziness, lightheadness, chest pain, shortness of breath, weakness/numbness, and edema. She uses Lasix as needed for leg swelling which she uses maybe a few days once or twice a month. This past year we noted that her diuretic was causing her kidney function to worsen and I asked her to stop her HCTZ. She reports there have been no other symptoms noted. She also has Stage IIIa  CKD which we have been following.   Dana Horton returns today for routine followup on her cholesterol.  I calculated her ASCVD score from this past year and despite her cholesterol looking good, her ASCVD score was 31.7%.  I therefore started her on pravastatin.  She was on Lipitor in the past but this gave her diarrhea so she quit it.  However, she states that with her pravastatin, she developed diarrhea once again and stopped it.    Overall, she states she is doing well and is without any complaints or problems at this time. She specifically denies chest pain, abdominal pain, nausea, diarrhea, and myalgias. She remains on dietary management alone.  She is fasting in anticipation for labs today.   The patient also states that several days ago she mowed her lawn.  Since then, she has had allergy symptoms with sinus congestion with clear nasal discharge with post nasal discharge with sneezing and watery itchy eyes.  She has not tried anything for her allergies.   The patient is a 77 year old Caucasian/White female who presents for a follow-up evaluation of hypertension.  This past year the patient was acutely sick and she was having low BP's with symptoms of dizziness and generalized weakness.  She stopped the hydralazine and her symptoms went away after she recovered.  The patient restarted her hydralazine '25mg'$  BID afterwards and she tolerated that well.  The patient has been checking her blood pressure at home.  She states her SBP was running in the 120-130's range.  The patient's current medications include: metoprolol succinate 25 mg BID, valsartan '320mg'$  daily, hydralazine '25mg'$  BID.  The patient has been tolerating her medications well. The patient denies any visual changes, dizziness, lightheadness, chest pain, shortness of breath, weakness/numbness, and edema. She uses Lasix as needed for leg swelling which she uses maybe a few days once or twice a month. This past year we noted that her diuretic was causing her kidney function to worsen and I asked her to stop her HCTZ. She reports there have been no other symptoms noted. She also has Stage IIIa CKD which we have been following.    Dana Horton returns today for routine followup on her cholesterol.  I calculated her ASCVD score from this past year and despite her cholesterol looking good, her ASCVD score was 31.7%.  I therefore started her on pravastatin.  She was on Lipitor  in the past but this gave her diarrhea so she quit it.  Overall, she states she is doing well and is without any complaints or problems at this time. She specifically denies chest pain, abdominal pain, nausea, diarrhea, and myalgias. She remains on dietary management and pravastatin '80mg'$  daily.  She is fasting in anticipation for labs today.     The patient also has a history of paroxysmal Atrial Fib where she last saw her cardiologist on 03/10/2020 as a telehealth visit.  She was stable in regards to her A. Fib.  This was initially diagnosed in 2018 where she came to see me on 08/2016 with chest pain where she was sent to [facility] and was admitted.   An ECHO was performed on 09/14/2016 showed a normal LVEF of 60-65% with impaired relaxation with mildly dilated LA and mild TR and MR.  She is currently on metoprolol with flecanide.  Her CHAD-VASC2 score is a 3.  In the hospital initially, she was started on an ASA but had hematochezia.  She was sent to GI where they  did an EGD which was normal and a colonoscopy that showed polyps.  They ultimately felt that her bleeding was from an anal source and felt she could go on anticoagulation.   Today, she denies any chest pain, palpitations, SOB, swelling or other problems.  She denies any BRBPR, melena, bleeding, bruising or other problems from her Eliquis.    The patient also presents with a history of COPD diagnosed in 03/2012 and presents today for a status visit.  She did have a COPD exacerbation this past year in 02/2022 which she recovered.  She also has a history of REM related sleep apnea where she uses a CPAP at night.   Dr. Gardenia Phlegm was also following her for a pulmonary nodule which was diagnosed on CT scan of the chest in 12/2012.  She tells me that Dr. Gardenia Phlegm has told her that this nodules was "ok".  The patient also has a history of allergies. She is usually affected in the fall and spring. Her symptoms include rhinorrhea, cough where she will use zyrtec, flonase  and coricidin HBP.  See PMH for summary of pulmonary history and lab reports for status of any previous PFTs: COPD, Lung Nodule, Obstructive Sleep Apnea, and Tobacco Abuse. Comorbid conditions : active smoker. She has no baseline symptoms of COPD. The patient's condition is controlled by medications as summarized in the medication list on the face sheet. She has the following modifiable risk factors: smoking. Specifically denied complaints: worsening exertional dyspnea, increased wheezing, productive cough, change in color of sputum, pleuritic chest pain, hemoptysis, and fever. Interval history: see Dr. Signa Kell notes which were reviewed. Interval studies: PFTs. She remains on symbicort which she uses occasionally.  She does use Symbicort but does not use this regularly anymore and just uses it during the allergy season.  She does not use a rescue inhaler.  The patient states she quit smoking on 03/26/2017 for four months but has restarted smoking in 07/2017 where she is currently smoking about 1/2 ppd.  There has been no change in this past year with her tobacco use.  She has smoked since she was 77 years of age.  She has tried chantix twice in the past but stopped it due to nausea and she is allergic to zyban.  The patient is a 77 year old Caucasian/White female , post-menopausal, who returns for followup of her osteopenia. She has no specific complaints related to bone loss. She does have a family history of osteoporosis in her mother. The following risk factors are noted: smoking and high caffeine intake She denies the following: diabetes mellitus, alcohol consumption of more that 7 ounces per week, daily prednisone use, hyperthyroidism, surgical resection of her bowel, and surgical resection of her stomach. She states she does not exercise routinely. A bone mineral density study was performed 12/2013 and this showed osteopenia with a t-score of -1.3.  We repeated a bone density in 03/2016 and she had a repeat  t-score of -1.3.  We also repeated another bone density in 05/11/2019 and this showed a t-score of -1 which is now normal. She has had Vit D defiency and she was started on Vit D supplementation.  The patient had her iv reclast done on 02/2014, 03/2015 and 03/2016 for 3 years and this is now discontinued.   The patient also has a history of osteoarthritis where she also tells me that her chiropractor diagnosed her with rheumatoid arthritis off xrays.  He never did any blood work but I did  blood work for [employer], ANA, ESR, etc back in 2013 and these labs were normal.  Her pain involves her bilateral DIP joints, bilateral PIP joints, bilateral wrists, bilateral MCP joints, and bilateral knees, shoulders and her neck.  Her chiropractor gave her exercises and this has helped.  The patient states that RA runs in her family.  She does use tylenol which helps some.      Are there smokers in your home (other than you)? No  Risk Factors Current exercise habits:  as above   Dietary issues discussed: none   Depression Screen (Note: if answer to either of the following is "Yes", a more complete depression screening is indicated)   Over the past two weeks, have you felt down, depressed or hopeless? No  Over the past two weeks, have you felt little interest or pleasure in doing things? No  Have you lost interest or pleasure in daily life? No  Do you often feel hopeless? No  Do you cry easily over simple problems? No  Activities of Daily Living In your present state of health, do you have any difficulty performing the following activities?:  Driving? No Managing money?  No Feeding yourself? No Getting from bed to chair? No Climbing a flight of stairs? No Preparing food and eating?: No Bathing or showering? No Getting dressed: No Getting to the toilet? No Using the toilet:No Moving around from place to place: No In the past year have you fallen or had a near fall?:No   Are you sexually active?  No  Do  you have more than one partner?  No  Hearing Difficulties: No Do you often ask people to speak up or repeat themselves? No Do you experience ringing or noises in your ears? No Do you have difficulty understanding soft or whispered voices? No   Do you feel that you have a problem with memory? No  Do you often misplace items? No  Do you feel safe at home?  Yes  Cognitive Testing  Alert? Yes  Normal Appearance?Yes  Oriented to person? Yes  Place? Yes   Time? Yes  Recall of three objects?  Yes  Can perform simple calculations? Yes  Displays appropriate judgment?Yes  Can read the correct time from a watch face?Yes  Fall Risk Prevention  Any stairs in or around the home? no If so, are there any without handrails? No  Home free of loose throw rugs in walkways, pet beds, electrical cords, etc? No  Adequate lighting in your home to reduce risk of falls? No  Use of a cane, walker or w/c? No    Time Up and Go  Was the test performed? Yes .  Length of time to ambulate 10 feet: 7 sec.   Gait steady and fast without use of assistive device    Advanced Directives have been discussed with the patient? Yes   List the Names of Other Physician/Practitioners you currently use: Patient Care Team: Nona Dell, Corene Cornea, MD as PCP - General (Internal Medicine) Skeet Latch, MD as PCP - Cardiology (Cardiology)    Past Medical History:  Diagnosis Date   Allergy    Atrial fibrillation [facility])    CHF (congestive heart failure) (HCC)    CKD (chronic kidney disease) stage 3, GFR 30-59 ml/min (HCC)    COPD (chronic obstructive pulmonary disease) ([place])    Essential hypertension    History of bladder cancer    Lung nodule    Myocardial infarction [facility])    ?  small with atrial fib    Osteoporosis    Seasonal allergies    Sleep apnea    cpap  setting at 2.5 per patient    Tobacco abuse     Past Surgical History:  Procedure Laterality Date   ABDOMINAL HYSTERECTOMY     BLADDER SURGERY      COLONOSCOPY N/A 12/28/2016   Procedure: COLONOSCOPY;  Surgeon: Doran Stabler, MD;  Location: [facility];  Service: Gastroenterology;  Laterality: N/A;   LEFT HEART CATH AND CORONARY ANGIOGRAPHY N/A 04/09/2017   Procedure: LEFT HEART CATH AND CORONARY ANGIOGRAPHY;  Surgeon: Jettie Booze, MD;  Location: [facility];  Service: Cardiovascular;  Laterality: N/A;   MOUTH SURGERY     POLYPECTOMY N/A 12/28/2016   Procedure: POLYPECTOMY with injection of [place];  Surgeon: Doran Stabler, MD;  Location: [facility];  Service: Gastroenterology;  Laterality: N/A;      Current Medications  Current Outpatient Medications  Medication Sig Dispense Refill   cetirizine (ZYRTEC) 10 MG tablet Take 10 mg by mouth daily as needed for allergies. (Patient not taking: Reported on 11/30/2021)     ELIQUIS 5 MG TABS tablet TAKE ONE TABLET BY MOUTH TWICE DAILY 180 tablet 1   flecainide (TAMBOCOR) 100 MG tablet Take 1 tablet (100 mg total) by mouth 2 (two) times daily. NEEDS F/U W/DR  180 tablet 1   fluticasone (FLONASE) 50 MCG/ACT nasal spray Place 2 sprays into both nostrils daily. 15.8 mL 0   furosemide (LASIX) 20 MG tablet TAKE 1 TABLET BY MOUTH DAILY IF NEEDED FOR SHORTNESS OF BREATH OR LOWER EXTEMITY EDEMA (Patient not taking: Reported on 11/30/2021) 90 tablet 3   hydrALAZINE (APRESOLINE) 25 MG tablet TAKE ONE TABLET BY MOUTH TWICE DAILY WITH FOOD 180 tablet 1   metoprolol succinate (TOPROL-XL) 25 MG 24 hr tablet TAKE ONE TABLET BY MOUTH TWICE DAILY WITH OR immediately following A meal 90 tablet 1   montelukast (SINGULAIR) 10 MG tablet TAKE ONE TABLET BY MOUTH EVERY EVENING 90 tablet 1   predniSONE (DELTASONE) 10 MG tablet Day 1-3:  Take 4 tabs po daily Day 4-6:  Take 3 tabs po daily Day 7-9:  Take 2 tabs po daily Day 10-12:  Take 1 tab po daily 30 tablet 0   SYMBICORT 160-4.5 MCG/ACT inhaler Take 2 puffs by mouth 2 (two) times daily. (Patient not taking: Reported on 11/30/2021)  3    valsartan (DIOVAN) 320 MG tablet TAKE ONE TABLET BY MOUTH EVERY MORNING 90 tablet 1   No current facility-administered medications for this visit.    Allergies Albuterol and Lisinopril   Social History Social History   Tobacco Use   Smoking status: Every Day    Packs/day: 0.50    Years: 52.00    Total pack years: 26.00    Types: Cigarettes    Last attempt to quit: 03/26/2017    Years since quitting: 5.1   Smokeless tobacco: Never   Tobacco comments:    1/2 pack a day  Substance Use Topics   Alcohol use: No     Review of Systems Review of Systems  Constitutional:  Negative for chills, fever, malaise/fatigue and weight loss.  HENT:  Negative for hearing loss and tinnitus.   Eyes:  Negative for blurred vision and double vision.  Respiratory:  Positive for wheezing. Negative for cough, hemoptysis and shortness of breath.   Cardiovascular:  Negative for chest pain, palpitations and leg swelling.  Gastrointestinal:  Negative  for abdominal pain, blood in stool, constipation, diarrhea, heartburn, melena, nausea and vomiting.  Genitourinary:  Negative for frequency and hematuria.  Musculoskeletal:  Negative for myalgias.  Skin:  Negative for itching and rash.  Neurological:  Negative for dizziness, weakness and headaches.  Psychiatric/Behavioral:  Negative for depression. The patient is not nervous/anxious.      Physical Exam:      Body mass index is 28.41 kg/m. BP (!) 168/70   Pulse 68   Temp (!) 97.5 F (36.4 C)   Resp 16   Ht '5\' 3"'$  (1.6 m)   Wt 160 lb 6.4 oz (72.8 kg)   LMP  (LMP Unknown)   SpO2 98%   BMI 28.41 kg/m   Physical Exam Constitutional:      Appearance: Normal appearance. She is not ill-appearing.  HENT:     Head: Normocephalic and atraumatic.     Right Ear: Tympanic membrane, ear canal and external ear normal.     Left Ear: Tympanic membrane, ear canal and external ear normal.     Nose: Nose normal. No congestion or rhinorrhea.     Mouth/Throat:      Mouth: Mucous membranes are moist.     Pharynx: Oropharynx is clear. No posterior oropharyngeal erythema.  Eyes:     General: No scleral icterus.    Conjunctiva/sclera: Conjunctivae normal.     Pupils: Pupils are equal, round, and reactive to light.  Neck:     Thyroid: No thyromegaly.     Vascular: No carotid bruit.  Cardiovascular:     Rate and Rhythm: Normal rate and regular rhythm.     Pulses: Normal pulses.     Heart sounds: Normal heart sounds. No murmur heard.    No friction rub. No gallop.  Pulmonary:     Effort: Pulmonary effort is normal. No respiratory distress.     Breath sounds: Wheezing present. No rhonchi or rales.  Abdominal:     General: Abdomen is flat. Bowel sounds are normal. There is no distension.     Palpations: Abdomen is soft.     Tenderness: There is no abdominal tenderness.  Musculoskeletal:     Cervical back: Normal range of motion. No tenderness.     Right lower leg: No edema.     Left lower leg: No edema.     Comments: No clubbing or cyanosis  Lymphadenopathy:     Cervical: No cervical adenopathy.  Skin:    General: Skin is warm and dry.     Findings: No rash.  Neurological:     General: No focal deficit present.     Mental Status: She is alert and oriented to person, place, and time.     Comments: CN II-XII grossly intact  Psychiatric:        Mood and Affect: Mood normal.        Behavior: Behavior normal.      Assessment:      PAF (paroxysmal atrial fibrillation) (HCC)  Sleep apnea, unspecified type  Chronic obstructive pulmonary disease, unspecified COPD type (HCC)  Seasonal allergies  Essential hypertension  Hypercholesterolemia  Cigarette smoker  Osteopenia, unspecified location  Stage 3a chronic kidney disease (HCC)  Primary osteoarthritis involving multiple joints  History of bladder cancer  BMI 28.0-28.9,adult    Plan:     During the course of the visit the patient was educated and counseled about  appropriate screening and preventive services including:   Pneumococcal vaccine  Influenza vaccine Screening mammography Bone densitometry screening  Colorectal cancer screening Smoking cessation counseling  Diet review for nutrition referral? Yes ____  Not Indicated __X__   Patient Instructions (the written plan) was given to the patient.  PAF (paroxysmal atrial fibrillation) ([place]) She is currently rate controlled where she is on metoprolol and flecainide.  She remains anticoagulated on eliquis at this time.  Essential hypertension Her BP is elevated today.  She has taken her BP meds this morning.  We will see what her BP is running on her next visit as her BP looked ok last year.  She has not seen a cardiologist since 2021 and we will get her setup.  Chronic obstructive pulmonary disease ([place]) She is having some mild wheezing today.  She has no other symptoms.  I want her to take the symbicort regularly and I have refilled her rescue inhaler.  Sleep apnea She has REM related sleep apnea where she is compliant on CPAP at this time.  Osteopenia She received 3 courses of iv reclast.  We will repeat a bone density this year.  Primary osteoarthritis involving multiple joints She can continue on tylenol as needed for arthritic pain.  Stage 3a chronic kidney disease ([place]) She has CKD where we will check her urine studes and renal function today.  She needs to avoid NSAIDS.  BMI 28.0-28.9,adult She needs to eat healthly and exercise more and lose weight.  Cigarette smoker We had a discussion about smoking cessation.  I want her to cut back on her smoking.  History of bladder cancer She has an appointment coming up this year to followup with urology.  Hypercholesterolemia She did not tolerate lipitor.  I am going to try her on crestor '5mg'$  every other day.  We will check her FLP today.  Seasonal allergies Continue flonase and zyrtec as needed.   Prevention Health maintenance  discussed.  We will arrange for bone density and mammogram.  We will contact GI to get her colonoscopy rescheduled.  She is interested in the RSV vaccine.  We will obtain some yearly labs.    Medicare Attestation I have personally reviewed: The patient's medical and social history Their use of alcohol, tobacco or illicit drugs Their current medications and supplements The patient's functional ability including ADLs,fall risks, home safety risks, cognitive, and hearing and visual impairment Diet and physical activities Evidence for depression or mood disorders  The patient's weight, height, and BMI have been recorded in the chart.  I have made referrals, counseling, and provided education to the patient based on review of the above and I have provided the patient with a written personalized care plan for preventive services.     Townsend Roger, MD   05/21/2022

## 2022-05-22 LAB — CMP14 + ANION GAP
ALT: 12 IU/L (ref 0–32)
AST: 14 IU/L (ref 0–40)
Albumin/Globulin Ratio: 1.6 (ref 1.2–2.2)
Albumin: 4.1 g/dL (ref 3.8–4.8)
Alkaline Phosphatase: 85 IU/L (ref 44–121)
Anion Gap: 16 mmol/L (ref 10.0–18.0)
BUN/Creatinine Ratio: 21 (ref 12–28)
BUN: 18 mg/dL (ref 8–27)
Bilirubin Total: 0.3 mg/dL (ref 0.0–1.2)
CO2: 22 mmol/L (ref 20–29)
Calcium: 9.1 mg/dL (ref 8.7–10.3)
Chloride: 107 mmol/L — ABNORMAL HIGH (ref 96–106)
Creatinine, Ser: 0.84 mg/dL (ref 0.57–1.00)
Globulin, Total: 2.6 g/dL (ref 1.5–4.5)
Glucose: 90 mg/dL (ref 70–99)
Potassium: 4.3 mmol/L (ref 3.5–5.2)
Sodium: 145 mmol/L — ABNORMAL HIGH (ref 134–144)
Total Protein: 6.7 g/dL (ref 6.0–8.5)
eGFR: 72 mL/min/{1.73_m2} (ref >59)

## 2022-05-22 LAB — CBC WITH DIFFERENTIAL/PLATELET
Basophils Absolute: 0 10*3/uL (ref 0.0–0.2)
Basos: 0 %
EOS (ABSOLUTE): 0.2 10*3/uL (ref 0.0–0.4)
Eos: 2 %
Hematocrit: 34 % (ref 34.0–46.6)
Hemoglobin: 10.9 g/dL — ABNORMAL LOW (ref 11.1–15.9)
Immature Grans (Abs): 0 10*3/uL (ref 0.0–0.1)
Immature Granulocytes: 0 %
Lymphocytes Absolute: 2.4 10*3/uL (ref 0.7–3.1)
Lymphs: 33 %
MCH: 28.5 pg (ref 26.6–33.0)
MCHC: 32.1 g/dL (ref 31.5–35.7)
MCV: 89 fL (ref 79–97)
Monocytes Absolute: 0.6 10*3/uL (ref 0.1–0.9)
Monocytes: 9 %
Neutrophils Absolute: 3.9 10*3/uL (ref 1.4–7.0)
Neutrophils: 56 %
Platelets: 194 10*3/uL (ref 150–450)
RBC: 3.82 x10E6/uL (ref 3.77–5.28)
RDW: 13.5 % (ref 11.7–15.4)
WBC: 7.1 10*3/uL (ref 3.4–10.8)

## 2022-05-22 LAB — MICROALBUMIN / CREATININE URINE RATIO
Creatinine, Urine: 30.6 mg/dL
Microalb/Creat Ratio: 61 mg/g creat — ABNORMAL HIGH (ref 0–29)
Microalbumin, Urine: 18.7 ug/mL

## 2022-05-22 LAB — LIPID PANEL
Chol/HDL Ratio: 3.9 ratio (ref 0.0–4.4)
Cholesterol, Total: 172 mg/dL (ref 100–199)
HDL: 44 mg/dL (ref >39)
LDL Chol Calc (NIH): 110 mg/dL — ABNORMAL HIGH (ref 0–99)
Triglycerides: 95 mg/dL (ref 0–149)
VLDL Cholesterol Cal: 18 mg/dL (ref 5–40)

## 2022-05-22 LAB — TSH: TSH: 1.14 u[IU]/mL (ref 0.450–4.500)

## 2022-05-28 ENCOUNTER — Other Ambulatory Visit: Payer: Self-pay

## 2022-05-28 ENCOUNTER — Telehealth: Payer: Self-pay

## 2022-05-28 MED ORDER — DAPAGLIFLOZIN PROPANEDIOL 10 MG PO TABS: 10.0000 mg | ORAL_TABLET | Freq: Every day | ORAL | 1 refills | Status: DC

## 2022-05-28 NOTE — Telephone Encounter (Signed)
Pt notified of lab results. Sent in [place] '10mg'$  to [facility] in [place] per pt.

## 2022-05-28 NOTE — Telephone Encounter (Signed)
-----   Message from Townsend Roger, MD sent at 05/25/2022  1:47 PM EST ----- She is slightly anemic- we will follow.  Her urine shows she is spilling protein.  I want to start her on farxiga '10mg'$  daily (not for diabetes) but for her kidneys.  Her other labs look good.

## 2022-06-11 ENCOUNTER — Encounter: Payer: Self-pay | Admitting: Internal Medicine

## 2022-06-11 ENCOUNTER — Ambulatory Visit: Payer: Medicare Other | Admitting: Internal Medicine

## 2022-06-11 VITALS — BP 120/58 | HR 84 | Temp 98.0°F | Resp 18 | Ht 63.5 in | Wt 154.1 lb

## 2022-06-11 DIAGNOSIS — R059 Cough, unspecified: Secondary | ICD-10-CM

## 2022-06-11 DIAGNOSIS — R509 Fever, unspecified: Secondary | ICD-10-CM

## 2022-06-11 DIAGNOSIS — J441 Chronic obstructive pulmonary disease with (acute) exacerbation: Secondary | ICD-10-CM

## 2022-06-11 LAB — POC INFLUENZA A&B (BINAX/QUICKVUE)
Influenza A, POC: NEGATIVE
Influenza B, POC: NEGATIVE

## 2022-06-11 LAB — POC COVID19 BINAXNOW: SARS Coronavirus 2 Ag: NEGATIVE

## 2022-06-11 MED ORDER — METHYLPREDNISOLONE 4 MG PO TBPK: ORAL_TABLET | ORAL | 0 refills | Status: DC

## 2022-06-11 MED ORDER — DOXYCYCLINE MONOHYDRATE 100 MG PO TABS: 100.0000 mg | ORAL_TABLET | Freq: Two times a day (BID) | ORAL | 0 refills | Status: DC

## 2022-06-11 NOTE — Progress Notes (Signed)
   Acute Office Visit  Subjective:     Patient ID: Dana Horton, female    DOB: Nov 07, 1945, 77 y.o.   MRN: [id number]  Chief Complaint  Patient presents with   Covid sympyoms    HPI Patient is in today for running nose and body pain for 7 days, she has emphysema and COPD, she is wheezing, She also says that she has fever and chills but no temperature today. Her COVID and flu test are negative.   Review of Systems  HENT:  Positive for congestion.   Respiratory:  Positive for cough and wheezing.   Cardiovascular: Negative.   Gastrointestinal: Negative.   Neurological: Negative.         Objective:    BP (!) 120/58 (BP Location: Left Arm, Patient Position: Sitting)   Pulse 84   Temp 98 F (36.7 C)   Resp 18   Ht 5' 3.5" (1.613 m)   Wt 154 lb 2 oz (69.9 kg)   LMP  (LMP Unknown)   SpO2 93%   BMI 26.87 kg/m    Physical Exam Constitutional:      Appearance: Normal appearance.  HENT:     Head: Normocephalic and atraumatic.  Eyes:     Extraocular Movements: Extraocular movements intact.     Pupils: Pupils are equal, round, and reactive to light.  Cardiovascular:     Rate and Rhythm: Normal rate and regular rhythm.     Heart sounds: Normal heart sounds.  Pulmonary:     Effort: Pulmonary effort is normal.     Breath sounds: Normal breath sounds.  Abdominal:     General: Bowel sounds are normal.     Palpations: Abdomen is soft.  Neurological:     Mental Status: She is alert.     Results for orders placed or performed in visit on 06/11/22  POC COVID-19 BinaxNow  Result Value Ref Range   SARS Coronavirus 2 Ag Negative Negative  POC Influenza A&B(BINAX/QUICKVUE)  Result Value Ref Range   Influenza A, POC Negative Negative   Influenza B, POC Negative Negative        Assessment & Plan:   Problem List Items Addressed This Visit       Respiratory   COPD exacerbation ([place])    I will start her on medrol dose pack and doxycycline, if she is  not better then will do cxray      Other Visit Diagnoses     Cough, unspecified type    -  Primary   Relevant Orders   POC COVID-19 BinaxNow (Completed)   POC Influenza A&B(BINAX/QUICKVUE) (Completed)   Fever, unspecified fever cause       Relevant Orders   POC COVID-19 BinaxNow (Completed)   POC Influenza A&B(BINAX/QUICKVUE) (Completed)       No orders of the defined types were placed in this encounter.   No follow-ups on file.  Garwin Brothers, MD

## 2022-06-11 NOTE — Assessment & Plan Note (Signed)
I will start her on medrol dose pack and doxycycline, if she is not better then will do cxray

## 2022-06-22 DIAGNOSIS — R319 Hematuria, unspecified: Secondary | ICD-10-CM | POA: Diagnosis not present

## 2022-06-22 DIAGNOSIS — K5730 Diverticulosis of large intestine without perforation or abscess without bleeding: Secondary | ICD-10-CM | POA: Diagnosis not present

## 2022-06-22 DIAGNOSIS — I700 Atherosclerosis of aorta: Secondary | ICD-10-CM | POA: Diagnosis not present

## 2022-06-22 DIAGNOSIS — Z7901 Long term (current) use of anticoagulants: Secondary | ICD-10-CM | POA: Diagnosis not present

## 2022-06-22 DIAGNOSIS — D500 Iron deficiency anemia secondary to blood loss (chronic): Secondary | ICD-10-CM | POA: Diagnosis not present

## 2022-06-22 DIAGNOSIS — D62 Acute posthemorrhagic anemia: Secondary | ICD-10-CM | POA: Diagnosis not present

## 2022-06-22 DIAGNOSIS — I10 Essential (primary) hypertension: Secondary | ICD-10-CM | POA: Diagnosis not present

## 2022-06-22 DIAGNOSIS — K922 Gastrointestinal hemorrhage, unspecified: Secondary | ICD-10-CM | POA: Diagnosis not present

## 2022-06-22 DIAGNOSIS — R531 Weakness: Secondary | ICD-10-CM | POA: Diagnosis not present

## 2022-06-22 DIAGNOSIS — R3129 Other microscopic hematuria: Secondary | ICD-10-CM | POA: Diagnosis not present

## 2022-06-22 LAB — LAB REPORT - SCANNED

## 2022-06-23 DIAGNOSIS — Z79899 Other long term (current) drug therapy: Secondary | ICD-10-CM | POA: Diagnosis not present

## 2022-06-23 DIAGNOSIS — K5751 Diverticulosis of both small and large intestine without perforation or abscess with bleeding: Secondary | ICD-10-CM | POA: Diagnosis not present

## 2022-06-23 DIAGNOSIS — D122 Benign neoplasm of ascending colon: Secondary | ICD-10-CM | POA: Diagnosis not present

## 2022-06-23 DIAGNOSIS — D509 Iron deficiency anemia, unspecified: Secondary | ICD-10-CM | POA: Diagnosis not present

## 2022-06-23 DIAGNOSIS — J069 Acute upper respiratory infection, unspecified: Secondary | ICD-10-CM | POA: Diagnosis not present

## 2022-06-23 DIAGNOSIS — Z9889 Other specified postprocedural states: Secondary | ICD-10-CM | POA: Diagnosis not present

## 2022-06-23 DIAGNOSIS — K921 Melena: Secondary | ICD-10-CM | POA: Diagnosis not present

## 2022-06-23 DIAGNOSIS — G4733 Obstructive sleep apnea (adult) (pediatric): Secondary | ICD-10-CM | POA: Diagnosis not present

## 2022-06-23 DIAGNOSIS — I129 Hypertensive chronic kidney disease with stage 1 through stage 4 chronic kidney disease, or unspecified chronic kidney disease: Secondary | ICD-10-CM | POA: Diagnosis not present

## 2022-06-23 DIAGNOSIS — D128 Benign neoplasm of rectum: Secondary | ICD-10-CM | POA: Diagnosis not present

## 2022-06-23 DIAGNOSIS — Z9849 Cataract extraction status, unspecified eye: Secondary | ICD-10-CM | POA: Diagnosis not present

## 2022-06-23 DIAGNOSIS — R1310 Dysphagia, unspecified: Secondary | ICD-10-CM | POA: Diagnosis not present

## 2022-06-23 DIAGNOSIS — F17210 Nicotine dependence, cigarettes, uncomplicated: Secondary | ICD-10-CM | POA: Diagnosis not present

## 2022-06-23 DIAGNOSIS — J441 Chronic obstructive pulmonary disease with (acute) exacerbation: Secondary | ICD-10-CM | POA: Diagnosis not present

## 2022-06-23 DIAGNOSIS — J449 Chronic obstructive pulmonary disease, unspecified: Secondary | ICD-10-CM | POA: Diagnosis not present

## 2022-06-23 DIAGNOSIS — K649 Unspecified hemorrhoids: Secondary | ICD-10-CM | POA: Diagnosis not present

## 2022-06-23 DIAGNOSIS — I517 Cardiomegaly: Secondary | ICD-10-CM | POA: Diagnosis not present

## 2022-06-23 DIAGNOSIS — R918 Other nonspecific abnormal finding of lung field: Secondary | ICD-10-CM | POA: Diagnosis not present

## 2022-06-23 DIAGNOSIS — K2980 Duodenitis without bleeding: Secondary | ICD-10-CM | POA: Diagnosis not present

## 2022-06-23 DIAGNOSIS — K317 Polyp of stomach and duodenum: Secondary | ICD-10-CM | POA: Diagnosis not present

## 2022-06-23 DIAGNOSIS — K3189 Other diseases of stomach and duodenum: Secondary | ICD-10-CM | POA: Diagnosis not present

## 2022-06-23 DIAGNOSIS — K621 Rectal polyp: Secondary | ICD-10-CM | POA: Diagnosis not present

## 2022-06-23 DIAGNOSIS — Z66 Do not resuscitate: Secondary | ICD-10-CM | POA: Diagnosis not present

## 2022-06-23 DIAGNOSIS — Z720 Tobacco use: Secondary | ICD-10-CM | POA: Diagnosis not present

## 2022-06-23 DIAGNOSIS — Z8719 Personal history of other diseases of the digestive system: Secondary | ICD-10-CM | POA: Diagnosis not present

## 2022-06-23 DIAGNOSIS — D125 Benign neoplasm of sigmoid colon: Secondary | ICD-10-CM | POA: Diagnosis not present

## 2022-06-23 DIAGNOSIS — I4891 Unspecified atrial fibrillation: Secondary | ICD-10-CM | POA: Diagnosis not present

## 2022-06-23 DIAGNOSIS — D132 Benign neoplasm of duodenum: Secondary | ICD-10-CM | POA: Diagnosis not present

## 2022-06-23 DIAGNOSIS — I959 Hypotension, unspecified: Secondary | ICD-10-CM | POA: Diagnosis not present

## 2022-06-23 DIAGNOSIS — E785 Hyperlipidemia, unspecified: Secondary | ICD-10-CM | POA: Diagnosis not present

## 2022-06-23 DIAGNOSIS — K5750 Diverticulosis of both small and large intestine without perforation or abscess without bleeding: Secondary | ICD-10-CM | POA: Diagnosis not present

## 2022-06-23 DIAGNOSIS — K644 Residual hemorrhoidal skin tags: Secondary | ICD-10-CM | POA: Diagnosis not present

## 2022-06-23 DIAGNOSIS — D3502 Benign neoplasm of left adrenal gland: Secondary | ICD-10-CM | POA: Diagnosis not present

## 2022-06-23 DIAGNOSIS — N183 Chronic kidney disease, stage 3 unspecified: Secondary | ICD-10-CM | POA: Diagnosis not present

## 2022-06-23 DIAGNOSIS — Z8551 Personal history of malignant neoplasm of bladder: Secondary | ICD-10-CM | POA: Diagnosis not present

## 2022-06-23 DIAGNOSIS — Z7901 Long term (current) use of anticoagulants: Secondary | ICD-10-CM | POA: Diagnosis not present

## 2022-06-23 DIAGNOSIS — K641 Second degree hemorrhoids: Secondary | ICD-10-CM | POA: Diagnosis not present

## 2022-06-23 DIAGNOSIS — K922 Gastrointestinal hemorrhage, unspecified: Secondary | ICD-10-CM | POA: Diagnosis not present

## 2022-06-23 DIAGNOSIS — F17200 Nicotine dependence, unspecified, uncomplicated: Secondary | ICD-10-CM | POA: Diagnosis not present

## 2022-06-23 DIAGNOSIS — R310 Gross hematuria: Secondary | ICD-10-CM | POA: Diagnosis not present

## 2022-06-23 DIAGNOSIS — K635 Polyp of colon: Secondary | ICD-10-CM | POA: Diagnosis not present

## 2022-06-26 ENCOUNTER — Ambulatory Visit: Payer: Medicare Other | Admitting: Cardiology

## 2022-06-29 ENCOUNTER — Encounter: Payer: Self-pay | Admitting: Internal Medicine

## 2022-06-29 ENCOUNTER — Ambulatory Visit: Payer: Medicare Other | Admitting: Internal Medicine

## 2022-06-29 VITALS — BP 120/60 | HR 77 | Temp 97.7°F | Resp 18 | Ht 63.0 in | Wt 156.0 lb

## 2022-06-29 DIAGNOSIS — D500 Iron deficiency anemia secondary to blood loss (chronic): Secondary | ICD-10-CM | POA: Insufficient documentation

## 2022-06-29 DIAGNOSIS — K5791 Diverticulosis of intestine, part unspecified, without perforation or abscess with bleeding: Secondary | ICD-10-CM

## 2022-06-29 DIAGNOSIS — I10 Essential (primary) hypertension: Secondary | ICD-10-CM

## 2022-06-29 HISTORY — DX: Diverticulosis of intestine, part unspecified, without perforation or abscess with bleeding: K57.91

## 2022-06-29 HISTORY — DX: Iron deficiency anemia secondary to blood loss (chronic): D50.0

## 2022-06-29 MED ORDER — FERRIC MALTOL 30 MG PO CAPS: 1.0000 | ORAL_CAPSULE | Freq: Every day | ORAL | 0 refills | Status: DC

## 2022-06-29 NOTE — Assessment & Plan Note (Signed)
She has iron deficieny anemia where her discharge hemoglobin was 9.2.  She normally has a HgB baseline of 13-14.  I am going to start her on accrufer iron pills.  We will recheck her HgB when she comes back next.

## 2022-06-29 NOTE — Assessment & Plan Note (Signed)
She had severe diverticulosis and they removed 11 polyps and did H. Pylori and other biopsies.  The pathology is not back yet.  I want her to keep her stools soft.  Her GIB was possibly a combination of chronic blood loss from her diverticulosis or polyps.  She had some duodenitis and they recommended protonix.

## 2022-06-29 NOTE — Progress Notes (Signed)
Office Visit  Subjective   Patient ID: Dana Horton   DOB: 01/30/1946   Age: 77 y.o.   MRN: [id number]   Chief Complaint Chief Complaint  Patient presents with   Hospitalization Follow-up     History of Present Illness The patient is a 77 yo female who comes in today for a hospital followup where she was admitted to [facility] from 06/23/2022 until 06/26/2022 due to GIB.  [facility] where she has a history significant for A. Fib on anticoagulation with eliquis.  RH did a CTA of her abd/pelvis which did not show any active bleed She [facility] on the above date where her HgB on transfer was 8.7.  Her hemoglobin remained stable during her hospitalization with a discharge hemoglobin of 9.2 on 06/26/2022.  She did not need a blood transfusion during her hospital stay.  They did iron studies and this showed an iron of 24 and ferritin level of 44.  GI was consulted and they performed an EGD and colonoscopy on 06/26/2022.  Her EGD showed that the esophagus and stomach appeared normal.  There was one sessile polyp measuring smaller than 5 mm in the duodenal bulb which was biopsied.  There was some mild erythematous mucosa (duodenitis) in the duodenal bulb and scalloped mucosa in the 1st part of the duodenum which was biopsied.  The 2nd part of the duodenum appeared normal.  They were continue to check H. Pylori and continued her pantoproazole and resume her eliquis.  Her colonoscopy showed no evidence of blood, no stigmata of recent bleeding, with a healthy appearing however there was extensive diverticulosis of severe severity and causing severe luminal narrowing with 3 prolapsing (grade 2) hemorrhoids and 11 subcentimeter polyps were removed.  I have reviewed her EPIC chart and I do not see her pathology reports back.  They recommend repeating a colonoscopy in 1 year.  They resumed her diet and she was discharged home.  She  was sent home on pantoproazole and they stopped her farxiga for her Stage III CKD due to "vaginal burning".  Today, she denies any more bleeding abdominal pain, dizziness, chest pain, palpitations, generalized weakness or SOB.  She states she stopped taking all her meds the night she went to the hospital.  She restarted her meds and when she took hydralazine 50mg  BID her SBP dropped to 97.  She states she got some weakness and SOB.  She has not gone back on hydralazine.  She took her metoprolol XL and valsartan this morning but again she has stopped her hydralazine.       Past Medical History Past Medical History:  Diagnosis Date   Allergy    Atrial fibrillation    CHF (congestive heart failure)    CKD (chronic kidney disease) stage 3, GFR 30-59 ml/min    COPD (chronic obstructive pulmonary disease)    Essential hypertension    History of bladder cancer    Lung nodule    Myocardial infarction    ? small with atrial fib    Osteoporosis    Seasonal allergies    Sleep apnea    cpap  setting at 2.5 per patient    Tobacco abuse      Allergies Allergies  Allergen Reactions   Albuterol Palpitations   Lisinopril Cough     Medications  Current Outpatient Medications:    pantoprazole (PROTONIX) 40 MG tablet, Take 40 mg by  mouth daily., Disp: , Rfl:    cetirizine (ZYRTEC) 10 MG tablet, Take 10 mg by mouth daily as needed for allergies., Disp: , Rfl:    ELIQUIS 5 MG TABS tablet, TAKE ONE TABLET BY MOUTH TWICE DAILY, Disp: 180 tablet, Rfl: 1   flecainide (TAMBOCOR) 100 MG tablet, Take 1 tablet (100 mg total) by mouth 2 (two) times daily. NEEDS F/U W/DR [place], Disp: 180 tablet, Rfl: 1   fluticasone (FLONASE) 50 MCG/ACT nasal spray, Place 2 sprays into both nostrils daily., Disp: 15.8 mL, Rfl: 0   furosemide (LASIX) 20 MG tablet, TAKE 1 TABLET BY MOUTH DAILY IF NEEDED FOR SHORTNESS OF BREATH OR LOWER EXTEMITY EDEMA, Disp: 90 tablet, Rfl: 3   levalbuterol (XOPENEX HFA) 45 MCG/ACT inhaler,  Inhale 1-2 puffs into the lungs every 4 (four) hours as needed for wheezing., Disp: 1 each, Rfl: 12   metoprolol succinate (TOPROL-XL) 25 MG 24 hr tablet, TAKE ONE TABLET BY MOUTH TWICE DAILY WITH OR immediately following A meal, Disp: 90 tablet, Rfl: 1   montelukast (SINGULAIR) 10 MG tablet, TAKE ONE TABLET BY MOUTH EVERY EVENING, Disp: 90 tablet, Rfl: 1   SYMBICORT 160-4.5 MCG/ACT inhaler, Inhale 2 puffs into the lungs 2 (two) times daily., Disp: 1 each, Rfl: 3   valsartan (DIOVAN) 320 MG tablet, TAKE ONE TABLET BY MOUTH EVERY MORNING, Disp: 90 tablet, Rfl: 1   Review of Systems Review of Systems  Constitutional:  Negative for chills and fever.  Eyes:  Negative for blurred vision and double vision.  Respiratory:  Negative for cough, shortness of breath and wheezing.   Cardiovascular:  Negative for chest pain, palpitations and leg swelling.  Gastrointestinal:  Negative for abdominal pain, blood in stool, constipation, diarrhea, heartburn, melena, nausea and vomiting.  Musculoskeletal:  Negative for myalgias.  Neurological:  Negative for dizziness, weakness and headaches.       Objective:    Vitals BP 120/60   Pulse 77   Temp 97.7 F (36.5 C)   Resp 18   Ht 5\' 3"  (1.6 m)   Wt 156 lb (70.8 kg)   LMP  (LMP Unknown)   SpO2 98%   BMI 27.63 kg/m    Physical Examination Physical Exam Constitutional:      Appearance: Normal appearance. She is not ill-appearing.  Cardiovascular:     Rate and Rhythm: Normal rate and regular rhythm.     Pulses: Normal pulses.     Heart sounds: No murmur heard.    No friction rub. No gallop.  Pulmonary:     Effort: Pulmonary effort is normal. No respiratory distress.     Breath sounds: No wheezing, rhonchi or rales.  Abdominal:     General: Bowel sounds are normal. There is no distension.     Palpations: Abdomen is soft.     Tenderness: There is no abdominal tenderness.  Musculoskeletal:     Right lower leg: No edema.     Left lower leg: No  edema.  Skin:    General: Skin is warm and dry.     Findings: No rash.  Neurological:     Mental Status: She is alert.        Assessment & Plan:   Essential hypertension When she takes her hydralazine with her valsartan and metoprolol succinate, her BP drops.  She was originally on hydralazine 25mg  BID in 2022-2023 but this was increased to 50mg  BID due to high BP.  I want her to stop the hydralzine for now  and continue on metoprolol sucinate and valsartan.  She is checking her BP like 10 times per day and I asked her to stop this and check it once about 3 hours after taking her BP meds.  She can take her BP if she has symptoms.  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 She had severe diverticulosis and they removed 11 polyps and did H. Pylori and other biopsies.  The pathology is not back yet.  I want her to keep her stools soft.  Her GIB was possibly a combination of chronic blood loss from her diverticulosis or polyps.  She had some duodenitis and they recommended protonix.  Iron deficiency anemia due to chronic blood loss She has iron deficieny anemia where her discharge hemoglobin was 9.2.  She normally has a HgB baseline of 13-14.  I am going to start her on accrufer iron pills.  We will recheck her HgB when she comes back next.    No follow-ups on file.   Crist Fat, MD

## 2022-06-29 NOTE — Assessment & Plan Note (Signed)
When she takes her hydralazine with her valsartan and metoprolol succinate, her BP drops.  She was originally on hydralazine 25mg  BID in 2022-2023 but this was increased to 50mg  BID due to high BP.  I want her to stop the hydralzine for now and continue on metoprolol sucinate and valsartan.  She is checking her BP like 10 times per day and I asked her to stop this and check it once about 3 hours after taking her BP meds.  She can take her BP if she has symptoms.

## 2022-07-02 ENCOUNTER — Telehealth: Payer: Self-pay

## 2022-07-02 NOTE — Patient Outreach (Signed)
  Care Coordination   Initial Visit Note   07/02/2022 Name: Dana Horton MRN: [id number] DOB: 1945/07/09  Dana Horton is a 77 y.o. year old female who sees Crist Fat, MD for primary care. I spoke with  Dana Horton by phone today.  What matters to the patients health and wellness today?  Placed call to patient to review and offer [facility] care coordination program. Patient reports that she is doing well and denies any needs. Reviewed recent hospitalization and patient has already had her follow up with PCP.  Patient denies any concerns today.       SDOH assessments and interventions completed:  No     Care Coordination Interventions:  No, not indicated   Follow up plan: No further intervention required.   Encounter Outcome:  Pt. Refused   Rowe Pavy, RN, BSN, CEN [facility] [phone]

## 2022-07-17 DIAGNOSIS — R351 Nocturia: Secondary | ICD-10-CM | POA: Diagnosis not present

## 2022-07-17 DIAGNOSIS — C672 Malignant neoplasm of lateral wall of bladder: Secondary | ICD-10-CM | POA: Diagnosis not present

## 2022-07-18 ENCOUNTER — Telehealth: Payer: Self-pay | Admitting: *Deleted

## 2022-07-18 NOTE — Telephone Encounter (Signed)
   Pre-operative Risk Assessment    Patient Name: Dana Horton  DOB: [birth year] MRN: [id number]   PT HAS NEW PT APPT 07/25/22 WITH DR. Dulce Sellar. I WILL UPDATE ALL PARTIES INVOLVED   Request for Surgical Clearance    Procedure:   CYSTOSCOPY, TURBT  Date of Surgery:  Clearance TBD                                 Surgeon:  DR. Debroah Baller Surgeon's Group or Practice Name:  [facility] Phone number:  [phone] Fax number:  [phone]   Type of Clearance Requested:   - Medical  - Pharmacy:  Hold Apixaban (Eliquis) x 3 DAYS PRIOR AND RESUME 3 DAYS POST OP   Type of Anesthesia:  Not Indicated; GENERAL ?   Additional requests/questions:    Elpidio Anis   07/18/2022, 5:30 PM

## 2022-07-24 DIAGNOSIS — R0789 Other chest pain: Secondary | ICD-10-CM | POA: Diagnosis not present

## 2022-07-24 DIAGNOSIS — M549 Dorsalgia, unspecified: Secondary | ICD-10-CM | POA: Diagnosis not present

## 2022-07-24 DIAGNOSIS — R051 Acute cough: Secondary | ICD-10-CM | POA: Diagnosis not present

## 2022-07-24 NOTE — Progress Notes (Unsigned)
Cardiology Office Note:    Date:  07/25/2022   ID:  Dana Horton, DOB 05-18-1945, MRN [id number]  PCP:  Dana Fat, MD  Cardiologist:  Dana Herrlich, MD   Referring MD: Dana Fat, MD  ASSESSMENT:    1. Preoperative cardiovascular examination   2. PAF (paroxysmal atrial fibrillation) (HCC)   3. Chronic anticoagulation   4. Essential hypertension   5. Iron deficiency anemia due to chronic blood loss   6. Hypotension, unspecified hypotension type    PLAN:    In order of problems listed above:  Cardiology perspective she is optimized for planned urologic surgery I have  adjusted cardiac medications and she can proceed with planned urologic surgery I have asked her to take her flecainide 50 mg twice daily we will check a flecainide level Enforced the need to take her Eliquis as directed to avoid stroke With symptomatic hypotension decrease her ARB to 160 mg daily I asked her to record blood pressure daily with a validated device I am concerned she has worsening anemia we will check a CBC today Event monitor regarding bradycardia  Next appointment 4 weeks   Medication Adjustments/Labs and Tests Ordered: Current medicines are reviewed at length with the patient today.  Concerns regarding medicines are outlined above.  Orders Placed This Encounter  Procedures   CBC   Basic Metabolic Panel (BMET)   Flecainide level   LONG TERM MONITOR-LIVE TELEMETRY (3-14 DAYS)   EKG 12-Lead   Meds ordered this encounter  Medications   valsartan (DIOVAN) 160 MG tablet    Sig: Take 1 tablet (160 mg total) by mouth daily.    Dispense:  90 tablet    Refill:  3   flecainide (TAMBOCOR) 50 MG tablet    Sig: Take 1 tablet (50 mg total) by mouth 2 (two) times daily.    Dispense:  180 tablet    Refill:  3   apixaban (ELIQUIS) 5 MG TABS tablet    Sig: Take 1 tablet (5 mg total) by mouth 2 (two) times daily.    Dispense:  180 tablet    Refill:  3    Preoperative  visit  History of Present Illness:    Dana Horton is a 77 y.o. female who is being seen today for operative cardiology evaluation at the request of Dana Fat, MD.   She seen at [facility] 06/22/2022 primarily of not feeling well low blood pressure and having episodes of hematuria.  Her blood pressure remained 80 127/60 she was anemic hemoglobin 9.7 platelets 275,000 potassium 3.8 creatinine 0.803 EKG was described as sinus rhythm nonspecific ST changes CT scan abdomen and pelvis showed aortic atherosclerosis diffuse diverticular disease.  ED diagnosis was blood loss anemia diverticular disease and it appears as if she was transferred to [facility].    At [facility] and her hemoglobin was stable she was seen by Horton with arrangements for endoscopy is a notation that endoscopy showed colon polyps no active bleeding and she was discharged Protonix.  She was previously seen in the [facility] office Dr. Chilton Horton with a history of paroxysmal atrial fibrillation COPD hypertension and obstructive sleep apnea.  Previous echocardiogram showed normal left ventricular ejection fraction 60 to 65% with mild mitral and tricuspid regurgitation.  At that time her antiarrhythmic drug was flecainide and anticoagulated with Eliquis  She has been seen by [facility] urology for bladder cancer with previous transurethral resection.  She was seen with  her PCP 06/29/2022 following admission [facility] with Horton bleed.  She did not require transfusion.  Hemoglobin at discharge was 9.2 while there she underwent endoscopy and colonoscopy.  Left heart catheterization 04/09/2017 showed normal coronary arteriography Procedures LEFT HEART CATH AND CORONARY ANGIOGRAPHY   Results reviewe Conclusion The left ventricular systolic function is normal. LV end diastolic pressure is normal. LVEDP 16 mm Hg. The left ventricular ejection fraction is greater  than 65% by visual estimate. There is no aortic valve stenosis. No angiographically apparent CAD.   She is seen today in preoperative visit.  TURB bladder cancer, procedures elective intermediate risk She can withdraw her anticoagulant 3 days prior to surgery generally restart 48 hours  Patient is predominantly concerned she does not feel well Ever since her hospitalization she is having weakness she is lost 20 pounds she said her blood pressure is erratic at times she gets systolics less than 90 heart rates less than 40 and she feels increasingly weak On her own she reduced flecainide to once daily Eliquis to once daily She has had no recurrent clinical bleeding No edema orthopnea chest pain palpitation or syncope Past Medical History:  Diagnosis Date   Allergy    Atrial fibrillation (HCC)    CHF (congestive heart failure) (HCC)    CKD (chronic kidney disease) stage 3, GFR 30-59 ml/min (HCC)    COPD (chronic obstructive pulmonary disease) (HCC)    Essential hypertension    History of bladder cancer    Lung nodule    Myocardial infarction (HCC)    ? small with atrial fib    Osteoporosis    Seasonal allergies    Sleep apnea    cpap  setting at 2.5 per patient    Tobacco abuse     Past Surgical History:  Procedure Laterality Date   ABDOMINAL HYSTERECTOMY     BLADDER SURGERY     COLONOSCOPY N/A 12/28/2016   Procedure: COLONOSCOPY;  Surgeon: Dana Rist, MD;  Location: [facility] ENDOSCOPY;  Service: Gastroenterology;  Laterality: N/A;   LEFT HEART CATH AND CORONARY ANGIOGRAPHY N/A 04/09/2017   Procedure: LEFT HEART CATH AND CORONARY ANGIOGRAPHY;  Surgeon: Dana Crafts, MD;  Location: [facility] INVASIVE CV LAB;  Service: Cardiovascular;  Laterality: N/A;   MOUTH SURGERY     POLYPECTOMY N/A 12/28/2016   Procedure: POLYPECTOMY with injection of [place];  Surgeon: Dana Rist, MD;  Location: [facility] ENDOSCOPY;  Service: Gastroenterology;  Laterality: N/A;    Current  Medications: Current Meds  Medication Sig   apixaban (ELIQUIS) 5 MG TABS tablet Take 1 tablet (5 mg total) by mouth 2 (two) times daily.   cetirizine (ZYRTEC) 10 MG tablet Take 10 mg by mouth daily as needed for allergies.   Ferric Maltol 30 MG CAPS Take 1 capsule (30 mg total) by mouth daily.   flecainide (TAMBOCOR) 50 MG tablet Take 1 tablet (50 mg total) by mouth 2 (two) times daily.   fluticasone (FLONASE) 50 MCG/ACT nasal spray Place 2 sprays into both nostrils daily.   levalbuterol (XOPENEX HFA) 45 MCG/ACT inhaler Inhale 1-2 puffs into the lungs every 4 (four) hours as needed for wheezing.   metoprolol succinate (TOPROL-XL) 25 MG 24 hr tablet TAKE ONE TABLET BY MOUTH TWICE DAILY WITH OR immediately following A meal (Patient taking differently: Take 25 mg by mouth daily.)   montelukast (SINGULAIR) 10 MG tablet TAKE ONE TABLET BY MOUTH EVERY EVENING   pantoprazole (PROTONIX) 40 MG tablet Take  40 mg by mouth daily.   rosuvastatin (CRESTOR) 5 MG tablet Take 5 mg by mouth every other day.   SYMBICORT 160-4.5 MCG/ACT inhaler Inhale 2 puffs into the lungs 2 (two) times daily.   valsartan (DIOVAN) 160 MG tablet Take 1 tablet (160 mg total) by mouth daily.   [DISCONTINUED] ELIQUIS 5 MG TABS tablet TAKE ONE TABLET BY MOUTH TWICE DAILY (Patient taking differently: Take 5 mg by mouth 2 (two) times daily. Taking 1 tablet daily)   [DISCONTINUED] flecainide (TAMBOCOR) 100 MG tablet Take 1 tablet (100 mg total) by mouth 2 (two) times daily. NEEDS F/U W/DR [facility] (Patient taking differently: Take 100 mg by mouth 2 (two) times daily. Taking 1 tablet daily)   [DISCONTINUED] valsartan (DIOVAN) 320 MG tablet TAKE ONE TABLET BY MOUTH EVERY MORNING     Allergies:   Albuterol and Lisinopril   Social History   Socioeconomic History   Marital status: Married    Spouse name: Not on file   Number of children: 3   Years of education: Not on file   Highest education level: Not on file  Occupational History    Occupation: Retired  Tobacco Use   Smoking status: Every Day    Packs/day: 0.50    Years: 52.00    Additional pack years: 0.00    Total pack years: 26.00    Types: Cigarettes   Smokeless tobacco: Never   Tobacco comments:    1/2 pack a day  Vaping Use   Vaping Use: Never used  Substance and Sexual Activity   Alcohol use: No   Drug use: No   Sexual activity: Not on file  Other Topics Concern   Not on file  Social History Narrative   Not on file   Social Determinants of Health   Financial Resource Strain: Not on file  Food Insecurity: Not on file  Transportation Needs: Not on file  Physical Activity: Not on file  Stress: Not on file  Social Connections: Not on file     Family History: The patient's family history includes Arrhythmia in her father; Bladder Cancer in her brother; CAD in her father; Cancer in her father; Heart disease in her father; Hypertension in her mother; Lung cancer in her brother; Stroke in her mother. There is no history of Colon cancer, Esophageal cancer, Stomach cancer, or Colon polyps.  ROS:   ROS Please see the history of present illness.     All other systems reviewed and are negative.  EKGs/Labs/Other Studies Reviewed:    The following studies were reviewed today:   Cardiac Studies & Procedures   CARDIAC CATHETERIZATION  CARDIAC CATHETERIZATION 04/09/2017  Narrative  The left ventricular systolic function is normal.  LV end diastolic pressure is normal. LVEDP 16 mm Hg.  The left ventricular ejection fraction is greater than 65% by visual estimate.  There is no aortic valve stenosis.  No angiographically apparent CAD.  Continue medical therapy.  Restart heparin gtt 8 hours after sheath pull for AFib/stroke prevention.  Findings Coronary Findings Diagnostic  Dominance: Right  Left Main Vessel was injected. Vessel is normal in caliber. Vessel is angiographically normal.  Left Anterior Descending Vessel was injected.  Vessel is normal in caliber. Vessel is angiographically normal.  Left Circumflex Vessel was injected. Vessel is normal in caliber. Vessel is angiographically normal.  Right Coronary Artery Vessel was injected. Vessel is normal in caliber. Vessel is angiographically normal.  Intervention  No interventions have been documented.   CARDIAC CATHETERIZATION  CARDIAC CATHETERIZATION 05/01/2017                EKG:  EKG is  ordered today.  The ekg ordered today is personally reviewed and demonstrates sinus rhythm and is normal no evidence of 1C antiarrhythmic drug toxicity  Recent Labs: 05/21/2022: ALT 12; BUN 18; Creatinine, Ser 0.84; Hemoglobin 10.9; Platelets 194; Potassium 4.3; Sodium 145; TSH 1.140  Recent Lipid Panel    Component Value Date/Time   CHOL 172 05/21/2022 1059   TRIG 95 05/21/2022 1059   HDL 44 05/21/2022 1059   CHOLHDL 3.9 05/21/2022 1059   LDLCALC 110 (H) 05/21/2022 1059    Physical Exam:    VS:  BP 126/60 (BP Location: Right Arm, Patient Position: Sitting, Cuff Size: Normal)   Pulse 70   Ht 5' 3.5" (1.613 m)   Wt 149 lb (67.6 kg)   LMP  (LMP Unknown)   SpO2 97%   BMI 25.98 kg/m     Wt Readings from Last 3 Encounters:  07/25/22 149 lb (67.6 kg)  06/29/22 156 lb (70.8 kg)  06/11/22 154 lb 2 oz (69.9 kg)     GEN:  well nourished, well developed in no acute distress HEENT: Normal NECK: No JVD; No carotid bruits LYMPHATICS: No lymphadenopathy CARDIAC: RRR, no murmurs, rubs, gallops RESPIRATORY:  Clear to auscultation without rales, wheezing or rhonchi  ABDOMEN: Soft, non-tender, non-distended MUSCULOSKELETAL:  No edema; No deformity  SKIN: Warm and dry NEUROLOGIC:  Alert and oriented x 3 PSYCHIATRIC:  Normal affect     Signed, Dana Herrlich, MD  07/25/2022 5:05 PM    [facility]

## 2022-07-25 ENCOUNTER — Ambulatory Visit: Payer: Medicare Other | Attending: Cardiology

## 2022-07-25 ENCOUNTER — Encounter: Payer: Self-pay | Admitting: Cardiology

## 2022-07-25 ENCOUNTER — Ambulatory Visit: Payer: Medicare Other | Attending: Cardiology | Admitting: Cardiology

## 2022-07-25 VITALS — BP 126/60 | HR 70 | Ht 63.5 in | Wt 149.0 lb

## 2022-07-25 DIAGNOSIS — Z01810 Encounter for preprocedural cardiovascular examination: Secondary | ICD-10-CM | POA: Insufficient documentation

## 2022-07-25 DIAGNOSIS — D500 Iron deficiency anemia secondary to blood loss (chronic): Secondary | ICD-10-CM | POA: Diagnosis not present

## 2022-07-25 DIAGNOSIS — I10 Essential (primary) hypertension: Secondary | ICD-10-CM | POA: Diagnosis not present

## 2022-07-25 DIAGNOSIS — I959 Hypotension, unspecified: Secondary | ICD-10-CM

## 2022-07-25 DIAGNOSIS — I480 Paroxysmal atrial fibrillation: Secondary | ICD-10-CM

## 2022-07-25 DIAGNOSIS — Z7901 Long term (current) use of anticoagulants: Secondary | ICD-10-CM | POA: Diagnosis not present

## 2022-07-25 MED ORDER — VALSARTAN 160 MG PO TABS: 160.0000 mg | ORAL_TABLET | Freq: Every day | ORAL | 3 refills | Status: DC

## 2022-07-25 MED ORDER — FLECAINIDE ACETATE 50 MG PO TABS: 50.0000 mg | ORAL_TABLET | Freq: Two times a day (BID) | ORAL | 3 refills | Status: DC

## 2022-07-25 MED ORDER — APIXABAN 5 MG PO TABS: 5.0000 mg | ORAL_TABLET | Freq: Two times a day (BID) | ORAL | 3 refills | Status: DC

## 2022-07-25 NOTE — Patient Instructions (Signed)
Medication Instructions:  Your physician has recommended you make the following change in your medication:   START: Eliquis 5 mg twice daily START: Flecanide 50 mg twice daily START: Valsartan 160 mg daily  *If you need a refill on your cardiac medications before your next appointment, please call your pharmacy*   Lab Work: Your physician recommends that you return for lab work in:   Labs today: CBC, BMP, Flecanide level  If you have labs (blood work) drawn today and your tests are completely normal, you will receive your results only by: MyChart Message (if you have MyChart) OR A paper copy in the mail If you have any lab test that is abnormal or we need to change your treatment, we will call you to review the results.   Testing/Procedures: A zio monitor was ordered today. It will remain on for 14 days. You will then return monitor and event diary in provided box. It takes 1-2 weeks for report to be downloaded and returned to [place]. We will call you with the results. If monitor falls off or has orange flashing light, please call [employer] for further instructions.     Follow-Up: At [facility], you and your health needs are our priority.  As part of our continuing mission to provide you with exceptional heart care, we have created designated Provider Care Teams.  These Care Teams include your primary Cardiologist (physician) and Advanced Practice Providers (APPs -  Physician Assistants and Nurse Practitioners) who all work together to provide you with the care you need, when you need it.  We recommend signing up for the patient portal called "MyChart".  Sign up information is provided on this After Visit Summary.  MyChart is used to connect with patients for Virtual Visits (Telemedicine).  Patients are able to view lab/test results, encounter notes, upcoming appointments, etc.  Non-urgent messages can be sent to your provider as well.   To learn more about what you can do with  MyChart, go to [email or web address].    Your next appointment:   4 week(s)  Provider:   Norman Herrlich, MD    Other Instructions Record blood pressures daily

## 2022-07-26 DIAGNOSIS — D500 Iron deficiency anemia secondary to blood loss (chronic): Secondary | ICD-10-CM | POA: Diagnosis not present

## 2022-07-26 DIAGNOSIS — I480 Paroxysmal atrial fibrillation: Secondary | ICD-10-CM | POA: Diagnosis not present

## 2022-07-26 DIAGNOSIS — I10 Essential (primary) hypertension: Secondary | ICD-10-CM

## 2022-07-26 DIAGNOSIS — K5791 Diverticulosis of intestine, part unspecified, without perforation or abscess with bleeding: Secondary | ICD-10-CM

## 2022-07-26 LAB — BASIC METABOLIC PANEL
BUN/Creatinine Ratio: 25 (ref 12–28)
BUN: 23 mg/dL (ref 8–27)
CO2: 22 mmol/L (ref 20–29)
Calcium: 9.2 mg/dL (ref 8.7–10.3)
Chloride: 103 mmol/L (ref 96–106)
Creatinine, Ser: 0.93 mg/dL (ref 0.57–1.00)
Glucose: 87 mg/dL (ref 70–99)
Potassium: 3.9 mmol/L (ref 3.5–5.2)
Sodium: 139 mmol/L (ref 134–144)
eGFR: 64 mL/min/{1.73_m2} (ref >59)

## 2022-07-26 LAB — CBC
Hematocrit: 26 % — ABNORMAL LOW (ref 34.0–46.6)
Hemoglobin: 8 g/dL — ABNORMAL LOW (ref 11.1–15.9)
MCH: 24.9 pg — ABNORMAL LOW (ref 26.6–33.0)
MCHC: 30.8 g/dL — ABNORMAL LOW (ref 31.5–35.7)
MCV: 81 fL (ref 79–97)
Platelets: 207 10*3/uL (ref 150–450)
RBC: 3.21 x10E6/uL — ABNORMAL LOW (ref 3.77–5.28)
RDW: 14.8 % (ref 11.7–15.4)
WBC: 7.8 10*3/uL (ref 3.4–10.8)

## 2022-07-30 ENCOUNTER — Telehealth: Payer: Self-pay | Admitting: Cardiology

## 2022-07-30 NOTE — Telephone Encounter (Signed)
Pt currently wearing a heart monitor. She states that monitor is not working and flashing orange. She would ike a callback regarding this matter. Please advise

## 2022-07-30 NOTE — Telephone Encounter (Signed)
Advised pt to call Zio which she states she has and they advised her that she is in an area the information is not being received. Pt states that the box is flashing orange and not the patch. Advised pt to call Zio back and make sure the monitor itself is monitoring. Pt verbalized understanding and had no additional questions.

## 2022-07-31 LAB — FLECAINIDE LEVEL: Flecainide: 0.35 ug/ml (ref 0.20–1.00)

## 2022-08-06 ENCOUNTER — Encounter (HOSPITAL_COMMUNITY): Payer: Self-pay

## 2022-08-06 ENCOUNTER — Other Ambulatory Visit: Payer: Self-pay

## 2022-08-06 ENCOUNTER — Emergency Department (HOSPITAL_COMMUNITY): Payer: Medicare Other

## 2022-08-06 ENCOUNTER — Inpatient Hospital Stay (HOSPITAL_COMMUNITY)
Admission: EM | Admit: 2022-08-06 | Discharge: 2022-08-09 | DRG: 669 | Disposition: A | Discharge status: Home / Self Care | Payer: Medicare Other | Attending: Internal Medicine | Admitting: Internal Medicine

## 2022-08-06 ENCOUNTER — Ambulatory Visit: Payer: Medicare Other | Admitting: Internal Medicine

## 2022-08-06 ENCOUNTER — Encounter: Payer: Self-pay | Admitting: Internal Medicine

## 2022-08-06 VITALS — BP 122/52 | HR 75 | Temp 98.0°F | Resp 16 | Ht 63.0 in | Wt 149.6 lb

## 2022-08-06 DIAGNOSIS — D649 Anemia, unspecified: Secondary | ICD-10-CM | POA: Diagnosis present

## 2022-08-06 DIAGNOSIS — Z79899 Other long term (current) drug therapy: Secondary | ICD-10-CM

## 2022-08-06 DIAGNOSIS — K5730 Diverticulosis of large intestine without perforation or abscess without bleeding: Secondary | ICD-10-CM | POA: Diagnosis not present

## 2022-08-06 DIAGNOSIS — I509 Heart failure, unspecified: Secondary | ICD-10-CM | POA: Diagnosis present

## 2022-08-06 DIAGNOSIS — R319 Hematuria, unspecified: Secondary | ICD-10-CM | POA: Diagnosis not present

## 2022-08-06 DIAGNOSIS — F17200 Nicotine dependence, unspecified, uncomplicated: Secondary | ICD-10-CM | POA: Diagnosis not present

## 2022-08-06 DIAGNOSIS — I959 Hypotension, unspecified: Secondary | ICD-10-CM | POA: Diagnosis present

## 2022-08-06 DIAGNOSIS — D509 Iron deficiency anemia, unspecified: Secondary | ICD-10-CM | POA: Diagnosis present

## 2022-08-06 DIAGNOSIS — Z7901 Long term (current) use of anticoagulants: Secondary | ICD-10-CM

## 2022-08-06 DIAGNOSIS — D500 Iron deficiency anemia secondary to blood loss (chronic): Secondary | ICD-10-CM | POA: Diagnosis not present

## 2022-08-06 DIAGNOSIS — C679 Malignant neoplasm of bladder, unspecified: Secondary | ICD-10-CM | POA: Diagnosis not present

## 2022-08-06 DIAGNOSIS — Z888 Allergy status to other drugs, medicaments and biological substances status: Secondary | ICD-10-CM

## 2022-08-06 DIAGNOSIS — N183 Chronic kidney disease, stage 3 unspecified: Secondary | ICD-10-CM | POA: Diagnosis present

## 2022-08-06 DIAGNOSIS — M810 Age-related osteoporosis without current pathological fracture: Secondary | ICD-10-CM | POA: Diagnosis present

## 2022-08-06 DIAGNOSIS — I4891 Unspecified atrial fibrillation: Secondary | ICD-10-CM | POA: Insufficient documentation

## 2022-08-06 DIAGNOSIS — N281 Cyst of kidney, acquired: Secondary | ICD-10-CM | POA: Diagnosis present

## 2022-08-06 DIAGNOSIS — Z801 Family history of malignant neoplasm of trachea, bronchus and lung: Secondary | ICD-10-CM

## 2022-08-06 DIAGNOSIS — I480 Paroxysmal atrial fibrillation: Secondary | ICD-10-CM | POA: Diagnosis present

## 2022-08-06 DIAGNOSIS — D3502 Benign neoplasm of left adrenal gland: Secondary | ICD-10-CM | POA: Diagnosis not present

## 2022-08-06 DIAGNOSIS — Z8052 Family history of malignant neoplasm of bladder: Secondary | ICD-10-CM

## 2022-08-06 DIAGNOSIS — Z7951 Long term (current) use of inhaled steroids: Secondary | ICD-10-CM

## 2022-08-06 DIAGNOSIS — I10 Essential (primary) hypertension: Secondary | ICD-10-CM | POA: Diagnosis present

## 2022-08-06 DIAGNOSIS — F17210 Nicotine dependence, cigarettes, uncomplicated: Secondary | ICD-10-CM | POA: Diagnosis not present

## 2022-08-06 DIAGNOSIS — J449 Chronic obstructive pulmonary disease, unspecified: Secondary | ICD-10-CM | POA: Diagnosis present

## 2022-08-06 DIAGNOSIS — E7800 Pure hypercholesterolemia, unspecified: Secondary | ICD-10-CM | POA: Diagnosis present

## 2022-08-06 DIAGNOSIS — I252 Old myocardial infarction: Secondary | ICD-10-CM | POA: Diagnosis not present

## 2022-08-06 DIAGNOSIS — Z823 Family history of stroke: Secondary | ICD-10-CM

## 2022-08-06 DIAGNOSIS — G4733 Obstructive sleep apnea (adult) (pediatric): Secondary | ICD-10-CM | POA: Diagnosis present

## 2022-08-06 DIAGNOSIS — Z90710 Acquired absence of both cervix and uterus: Secondary | ICD-10-CM

## 2022-08-06 DIAGNOSIS — D494 Neoplasm of unspecified behavior of bladder: Secondary | ICD-10-CM | POA: Insufficient documentation

## 2022-08-06 DIAGNOSIS — D630 Anemia in neoplastic disease: Secondary | ICD-10-CM | POA: Diagnosis not present

## 2022-08-06 DIAGNOSIS — D090 Carcinoma in situ of bladder: Secondary | ICD-10-CM | POA: Diagnosis not present

## 2022-08-06 DIAGNOSIS — Z8249 Family history of ischemic heart disease and other diseases of the circulatory system: Secondary | ICD-10-CM | POA: Diagnosis not present

## 2022-08-06 DIAGNOSIS — D62 Acute posthemorrhagic anemia: Secondary | ICD-10-CM | POA: Diagnosis present

## 2022-08-06 DIAGNOSIS — M542 Cervicalgia: Secondary | ICD-10-CM | POA: Diagnosis present

## 2022-08-06 DIAGNOSIS — R310 Gross hematuria: Secondary | ICD-10-CM | POA: Diagnosis not present

## 2022-08-06 DIAGNOSIS — C678 Malignant neoplasm of overlapping sites of bladder: Secondary | ICD-10-CM | POA: Diagnosis not present

## 2022-08-06 DIAGNOSIS — I130 Hypertensive heart and chronic kidney disease with heart failure and stage 1 through stage 4 chronic kidney disease, or unspecified chronic kidney disease: Secondary | ICD-10-CM | POA: Diagnosis present

## 2022-08-06 DIAGNOSIS — R109 Unspecified abdominal pain: Secondary | ICD-10-CM | POA: Diagnosis not present

## 2022-08-06 HISTORY — DX: Neoplasm of unspecified behavior of bladder: D49.4

## 2022-08-06 LAB — URINALYSIS, ROUTINE W REFLEX MICROSCOPIC
Bilirubin Urine: NEGATIVE
Glucose, UA: NEGATIVE mg/dL
Ketones, ur: NEGATIVE mg/dL
Nitrite: NEGATIVE
Protein, ur: 30 mg/dL — AB
RBC / HPF: >50 RBC/hpf (ref 0–5)
Specific Gravity, Urine: 1.023 (ref 1.005–1.030)
pH: 5 (ref 5.0–8.0)

## 2022-08-06 LAB — BASIC METABOLIC PANEL
Anion gap: 10 (ref 5–15)
BUN: 22 mg/dL (ref 8–23)
CO2: 22 mmol/L (ref 22–32)
Calcium: 8.6 mg/dL — ABNORMAL LOW (ref 8.9–10.3)
Chloride: 109 mmol/L (ref 98–111)
Creatinine, Ser: 1.09 mg/dL — ABNORMAL HIGH (ref 0.44–1.00)
GFR, Estimated: 53 mL/min — ABNORMAL LOW (ref >60)
Glucose, Bld: 134 mg/dL — ABNORMAL HIGH (ref 70–99)
Potassium: 4.2 mmol/L (ref 3.5–5.1)
Sodium: 141 mmol/L (ref 135–145)

## 2022-08-06 LAB — CBC
HCT: 21.4 % — ABNORMAL LOW (ref 36.0–46.0)
Hemoglobin: 6.4 g/dL — CL (ref 12.0–15.0)
MCH: 25.2 pg — ABNORMAL LOW (ref 26.0–34.0)
MCHC: 29.9 g/dL — ABNORMAL LOW (ref 30.0–36.0)
MCV: 84.3 fL (ref 80.0–100.0)
Platelets: 175 10*3/uL (ref 150–400)
RBC: 2.54 MIL/uL — ABNORMAL LOW (ref 3.87–5.11)
RDW: 17.2 % — ABNORMAL HIGH (ref 11.5–15.5)
WBC: 8.6 10*3/uL (ref 4.0–10.5)
nRBC: 0 % (ref 0.0–0.2)

## 2022-08-06 MED ORDER — SODIUM CHLORIDE 0.9% IV SOLUTION: Freq: Once | INTRAVENOUS | Status: AC

## 2022-08-06 MED ORDER — MONTELUKAST SODIUM 10 MG PO TABS: 10.0000 mg | ORAL_TABLET | Freq: Every evening | ORAL | 1 refills | Status: DC

## 2022-08-06 MED ORDER — SODIUM CHLORIDE 0.9 % IV BOLUS (SEPSIS)
500.0000 mL | Freq: Once | INTRAVENOUS | Status: AC
  Administered 2022-08-06: 500 mL via INTRAVENOUS

## 2022-08-06 MED ORDER — ONDANSETRON HCL 4 MG/2ML IJ SOLN
4.0000 mg | Freq: Once | INTRAMUSCULAR | Status: AC
  Administered 2022-08-07: 4 mg via INTRAVENOUS
  Filled 2022-08-06: qty 2

## 2022-08-06 MED ORDER — MORPHINE SULFATE (PF) 2 MG/ML IV SOLN
2.0000 mg | Freq: Once | INTRAVENOUS | Status: AC
  Administered 2022-08-07: 2 mg via INTRAVENOUS
  Filled 2022-08-06: qty 1

## 2022-08-06 MED ORDER — METOPROLOL SUCCINATE ER 25 MG PO TB24: 25.0000 mg | ORAL_TABLET | Freq: Two times a day (BID) | ORAL | 5 refills | Status: DC

## 2022-08-06 NOTE — ED Triage Notes (Signed)
Patient arrives with blood in her urine that started today, with flank pain on the right and left side. Patient also states she is having neck pain. History of afib, bladder tumor, anemia, and pinched nerve. Family reports the patient has been losing weight and is more pale than normal. Patient is also wearing an external heart monitor placed by her cardiologist on 5/3 for hypotension. Patient takes eliquis, flecainide, and metoprolol for afib. BP is 106/59 (67) in triage.

## 2022-08-06 NOTE — Assessment & Plan Note (Signed)
Her BP is improved where she was having hypotension.  Dr. Dulce Sellar cut her valsartan downt to 160mg  daily.  She remains on metoprolol succinate 25mg  BID.

## 2022-08-06 NOTE — ED Provider Notes (Signed)
[place] EMERGENCY DEPARTMENT AT [facility] Provider Note   CSN: [id number] Arrival date & time: 08/06/22  2157     History  Chief Complaint  Patient presents with   Flank Pain   Hematuria    Dana Horton is a 77 y.o. female.  With PMH A-fib on Eliquis, diverticulosis, CKD, remote bladder cancer status post TURBT, COPD, recent admission and discharged June 26, 2022 for suspected GI bleed presents with hematuria and flank pain.  Patient here with complaints of fatigue and bleeding.  She notes she has had intermittent blood in hematuria since Easter time trying to get help and thinks she has bleeding from a recurrent bladder tumor.  This was found on cystoscopy reportedly by Dr. Delrae Sawyers urology where she is supposed to have further workup.  She denies any melena or bright red blood per rectum.  She was just recently admitted for suspected GI bleed early April however there is no evidence of active bleeding on endoscopy or colonoscopy.  She has been complaining of bilateral flank pain and fatigue and lightheadedness however no syncopal episodes and no chest pain.  No vomiting no diarrhea.  No pain with urination or burning with urination.  She noted that bleeding worsened today with bright red blood hematuria.   Flank Pain  Hematuria       Home Medications Prior to Admission medications   Medication Sig Start Date End Date Taking? Authorizing Provider  apixaban (ELIQUIS) 5 MG TABS tablet Take 1 tablet (5 mg total) by mouth 2 (two) times daily. 07/25/22   Baldo Daub, MD  cetirizine (ZYRTEC) 10 MG tablet Take 10 mg by mouth daily as needed for allergies.    [provider]  Ferric Maltol 30 MG CAPS Take 1 capsule (30 mg total) by mouth daily. 06/29/22   Crist Fat, MD  flecainide (TAMBOCOR) 50 MG tablet Take 1 tablet (50 mg total) by mouth 2 (two) times daily. 07/25/22   Baldo Daub, MD  fluticasone (FLONASE) 50 MCG/ACT nasal spray  Place 2 sprays into both nostrils daily. 03/07/22   Crist Fat, MD  furosemide (LASIX) 20 MG tablet TAKE 1 TABLET BY MOUTH DAILY IF NEEDED FOR SHORTNESS OF BREATH OR LOWER EXTEMITY EDEMA Patient not taking: Reported on 07/25/2022 10/28/17   Chilton Si, MD  levalbuterol [facility] HFA) 45 MCG/ACT inhaler Inhale 1-2 puffs into the lungs every 4 (four) hours as needed for wheezing. 05/21/22   Crist Fat, MD  metoprolol succinate (TOPROL-XL) 25 MG 24 hr tablet Take 1 tablet (25 mg total) by mouth in the morning and at bedtime. 08/06/22   Crist Fat, MD  montelukast (SINGULAIR) 10 MG tablet Take 1 tablet (10 mg total) by mouth every evening. 08/06/22   Crist Fat, MD  pantoprazole (PROTONIX) 40 MG tablet Take 40 mg by mouth daily. 06/26/22 09/24/22  [provider]  rosuvastatin (CRESTOR) 5 MG tablet Take 5 mg by mouth every other day.    [provider]  SYMBICORT 160-4.5 MCG/ACT inhaler Inhale 2 puffs into the lungs 2 (two) times daily. 05/21/22   Crist Fat, MD  valsartan (DIOVAN) 160 MG tablet Take 1 tablet (160 mg total) by mouth daily. 07/25/22   Baldo Daub, MD      Allergies    Albuterol and Lisinopril    Review of Systems   Review of Systems  Genitourinary:  Positive for flank pain and hematuria.    Physical  Exam Updated Vital Signs BP (!) 106/59 (BP Location: Left Arm)   Pulse 74   Temp 98.6 F (37 C) (Oral)   Resp 18   Ht 5' 3.5" (1.613 m)   Wt 67.6 kg   LMP  (LMP Unknown)   SpO2 100%   BMI 25.98 kg/m  Physical Exam Constitutional: Pale appearing woman.  Alert and oriented.  Chronically ill-appearing but in no acute distress, ANO x 4, GCS 15 Eyes: Conjunctivae are normal. ENT      Head: Normocephalic and atraumatic. Cardiovascular: Regular rate, warm well-perfused Respiratory: Normal respiratory effort.  O2 sat 100 on RA Gastrointestinal: Soft and nondistended with mild bilateral flank tenderness worse on the left, no rebound or  guarding Musculoskeletal: Normal range of motion in all extremities. 1+ equal nontender pitting edema of bilateral shins equal nonerythematous Neurologic: Normal speech and language.  Moving all 4 extremities.  Sensation grossly intact.   Skin: Skin is warm, dry and pale Psychiatric: Mood and affect are normal. Speech and behavior are normal.  ED Results / Procedures / Treatments   Labs (all labs ordered are listed, but only abnormal results are displayed) Labs Reviewed  URINALYSIS, ROUTINE W REFLEX MICROSCOPIC - Abnormal; Notable for the following components:      Result Value   APPearance HAZY (*)    Hgb urine dipstick LARGE (*)    Protein, ur 30 (*)    Leukocytes,Ua TRACE (*)    Bacteria, UA FEW (*)    Non Squamous Epithelial 0-5 (*)    All other components within normal limits  BASIC METABOLIC PANEL - Abnormal; Notable for the following components:   Glucose, Bld 134 (*)    Creatinine, Ser 1.09 (*)    Calcium 8.6 (*)    GFR, Estimated 53 (*)    All other components within normal limits  CBC - Abnormal; Notable for the following components:   RBC 2.54 (*)    Hemoglobin 6.4 (*)    HCT 21.4 (*)    MCH 25.2 (*)    MCHC 29.9 (*)    RDW 17.2 (*)    All other components within normal limits    EKG None  Radiology No results found.  Procedures Procedures    Medications Ordered in ED Medications - No data to display  ED Course/ Medical Decision Making/ A&P                             Medical Decision Making  Dana Horton is a 77 y.o. female.  With PMH A-fib on Eliquis, diverticulosis, CKD, remote bladder cancer status post TURBT, COPD, recent admission and discharged June 26, 2022 for suspected GI bleed presents with hematuria and flank pain.   Reportedly patient has been having ongoing hematuria worsening in nature over the past couple of months.  I am concerned for bleed from possible tumor/mass lesion as patient reportedly had new recurring  tumor per cystoscopy performed outpatient by Dr. Saddie Benders.  Recently had admission with endoscopy colonoscopy for suspected GI bleed which were generally unremarkable without any active bleeding, hemorrhoids noted, polyps noted, H. pylori positive.  Rectal exam generally unrevealing and unremarkable today. ***Less likely GI bleed.  History of hysterectomy.   CT renal scan obtained to further evaluate for tumor lesion, stones among multiple other etiologies which showed no bladder lesions or acute findings. No stone, personally reviewed, evidence of mildly hyperdense lesions in the left kidney possible proteinaceous cyst.  Colonic diverticulosis present without diverticulitis.  Labs notable for new anemia hemoglobin 6.4 down from baseline of approximately 10/11.  Ordered for transfusion today.  Eliquis will be held but not emergently reversed as she is not hemodynamically unstable.  UA noted to have large hemoglobin, protein urea, trace leukocyte Estrace greater than 50 RBCs 11-20 WBCs and few bacteria.  She is without any dysuria, vomiting, fever, will hold off from treatment of UTI and allow for culture to result. Cr slightly elevated from baseline 1.09.   Amount and/or Complexity of Data Reviewed Labs: ordered. Radiology: ordered.  Risk Prescription drug management.      Final Clinical Impression(s) / ED Diagnoses Final diagnoses:  None    Rx / DC Orders ED Discharge Orders     None

## 2022-08-06 NOTE — Assessment & Plan Note (Signed)
She tells me she has a new bladder tumor.  We need to see what her anemia is doing before she goes for surgery.

## 2022-08-06 NOTE — Assessment & Plan Note (Signed)
We will check her CBC and obtain iron studies today.  She remains on accufer iron pills.

## 2022-08-06 NOTE — Assessment & Plan Note (Signed)
Her HR is rate controlled.  She is to continue on eliquis at this time.

## 2022-08-06 NOTE — ED Notes (Signed)
Blue top save tube sent to lab with other bloodwork 

## 2022-08-06 NOTE — Progress Notes (Signed)
Office Visit  Subjective   Patient ID: Heela Frydman Grissinger   DOB: December 23, 1945   Age: 77 y.o.   MRN: [id number]   Chief Complaint Chief Complaint  Patient presents with   Follow-up    From Cardiology-per Patient     History of Present Illness Mrs. Kuipers is a 77 yo female who comes in today to discuss her bladder tumor.  The patient was diagnosed with bladder cancer in 2017 where they noted a mass on her bladder on CT scan. She underwent resection with TURBT and did not require chemo, radiation or BCG infusions. Urology has been following her for survelliance where she last saw urology in 12/2021 and they did a cystoscopy which was normal.  However, she tells me she went back for a surveillance cystoscopy in 06/2022 and they found another bladder tumor (I do not have his notes).  Dr. Saddie Benders plans to do an elective operative procedure for this tumor.  I did see her last month on 4/5 for a hospital followup where she was admitted to [facility] from 06/23/2022 until 06/26/2022 due to GIB.  She presented to [facility] with melena that changed to BRBPR where she has a history significant for A. Fib on anticoagulation with eliquis.  RH did a CTA of her abd/pelvis which did not show any active bleed She was transferred to [facility] on the above date where her HgB on transfer was 8.7.  Her hemoglobin remained stable during her hospitalization with a discharge hemoglobin of 9.2 on 06/26/2022.  She did not need a blood transfusion during her hospital stay.  They did iron studies and this showed an iron of 24 and ferritin level of 44.  GI was consulted and they performed an EGD and colonoscopy on 06/26/2022.  Her EGD showed that the esophagus and stomach appeared normal.  There was one sessile polyp measuring smaller than 5 mm in the duodenal bulb which was biopsied.  There was some mild erythematous mucosa (duodenitis) in the duodenal bulb and scalloped mucosa in the 1st part of the duodenum which  was biopsied.  The 2nd part of the duodenum appeared normal.  They were continue to check H. Pylori and continued her pantoproazole and resume her eliquis.  Her colonoscopy showed no evidence of blood, no stigmata of recent bleeding, with a healthy appearing however there was extensive diverticulosis of severe severity and causing severe luminal narrowing with 3 prolapsing (grade 2) hemorrhoids and 11 subcentimeter polyps were removed.  I have reviewed her EPIC chart and I do not see her pathology reports back.  They recommend repeating a colonoscopy in 1 year.  They resumed her diet and she was discharged home.  She was sent home on pantoproazole and they stopped her farxiga for her Stage III CKD due to "vaginal burning".  Today, she denies any more bleeding abdominal pain, dizziness, chest pain, palpitations, generalized weakness or SOB.  She states she stopped taking all her meds the night she went to the hospital.  She restarted her meds and when she took hydralazine 50mg  BID her SBP dropped to 97.  She states she got some weakness and SOB.  I asked her on 4/5 to stop the hydralazine and continue on metoprolol sucinate and valsartan.  She is checking her BP like 10 times per day and I asked her to stop this and check it once about 3 hours after taking her BP meds.  She can take her BP if she has symptoms.  Over the interim, she did see Dr. Dulce Sellar on 07/26/2022 for preoperative evaluation for her bladder tumor as described above.  She was having intermittent hypotension and weakness where he felt that we should hold on elective urologic surgery followed in this note and direct back to her PCP she may require hematology consultation and perhaps IV iron.  He adjustted her flecainide level for her A. Fib and decreased her valsartan to 160mg  daily.  They have placed an event monitor to monitor for bradycardia and this will be completed tomorrow per the patient.  Dr. Dulce Sellar wanted her to be seen by myself in regards to  repeat blood counts/anemia.  I felt on her visit in 06/29/2022 that her GIB was possibly a combination of chronic blood loss from her diverticulosis or polyps.  She had some duodenitis and remained protonix.  Her discharge hemoglobin was 9.2.  She normally has a HgB baseline of 13-14.  I started her on accrufer iron pills.  Dr. Dulce Sellar rechecked her HgB on 07/26/2022 and it was 8.  Today, she denies any hematuria, BRBPR, but she is having melena colored stools (she is on iron).  She presents to me a BP diary today where her systolic BP has been running 100-110's.       Past Medical History Past Medical History:  Diagnosis Date   Allergy    Atrial fibrillation (HCC)    CHF (congestive heart failure) (HCC)    CKD (chronic kidney disease) stage 3, GFR 30-59 ml/min (HCC)    COPD (chronic obstructive pulmonary disease) (HCC)    Essential hypertension    History of bladder cancer    Lung nodule    Myocardial infarction (HCC)    ? small with atrial fib    Osteoporosis    Seasonal allergies    Sleep apnea    cpap  setting at 2.5 per patient    Tobacco abuse      Allergies Allergies  Allergen Reactions   Albuterol Palpitations   Lisinopril Cough     Medications  Current Outpatient Medications:    apixaban (ELIQUIS) 5 MG TABS tablet, Take 1 tablet (5 mg total) by mouth 2 (two) times daily., Disp: 180 tablet, Rfl: 3   cetirizine (ZYRTEC) 10 MG tablet, Take 10 mg by mouth daily as needed for allergies., Disp: , Rfl:    Ferric Maltol 30 MG CAPS, Take 1 capsule (30 mg total) by mouth daily., Disp: 90 capsule, Rfl: 0   flecainide (TAMBOCOR) 50 MG tablet, Take 1 tablet (50 mg total) by mouth 2 (two) times daily., Disp: 180 tablet, Rfl: 3   fluticasone (FLONASE) 50 MCG/ACT nasal spray, Place 2 sprays into both nostrils daily., Disp: 15.8 mL, Rfl: 0   furosemide (LASIX) 20 MG tablet, TAKE 1 TABLET BY MOUTH DAILY IF NEEDED FOR SHORTNESS OF BREATH OR LOWER EXTEMITY EDEMA (Patient not taking: Reported  on 07/25/2022), Disp: 90 tablet, Rfl: 3   levalbuterol (XOPENEX HFA) 45 MCG/ACT inhaler, Inhale 1-2 puffs into the lungs every 4 (four) hours as needed for wheezing., Disp: 1 each, Rfl: 12   montelukast (SINGULAIR) 10 MG tablet, TAKE ONE TABLET BY MOUTH EVERY EVENING, Disp: 90 tablet, Rfl: 1   pantoprazole (PROTONIX) 40 MG tablet, Take 40 mg by mouth daily., Disp: , Rfl:    rosuvastatin (CRESTOR) 5 MG tablet, Take 5 mg by mouth every other day., Disp: , Rfl:    SYMBICORT 160-4.5 MCG/ACT inhaler, Inhale 2 puffs into the lungs 2 (two) times daily.,  Disp: 1 each, Rfl: 3   valsartan (DIOVAN) 160 MG tablet, Take 1 tablet (160 mg total) by mouth daily., Disp: 90 tablet, Rfl: 3   Review of Systems Review of Systems  Constitutional:  Positive for malaise/fatigue. Negative for chills and fever.  Eyes:  Negative for blurred vision and double vision.  Respiratory:  Positive for shortness of breath. Negative for cough, hemoptysis and wheezing.   Cardiovascular:  Negative for chest pain, palpitations and leg swelling.  Gastrointestinal:  Positive for melena. Negative for abdominal pain, blood in stool, constipation, diarrhea, nausea and vomiting.  Genitourinary:  Negative for frequency and hematuria.  Neurological:  Positive for weakness. Negative for dizziness and headaches.       Objective:    Vitals BP (!) 122/52   Pulse 75   Temp 98 F (36.7 C)   Resp 16   Ht 5\' 3"  (1.6 m)   Wt 149 lb 9.6 oz (67.9 kg)   LMP  (LMP Unknown)   SpO2 97%   BMI 26.50 kg/m    Physical Examination Physical Exam Constitutional:      Appearance: Normal appearance. She is not ill-appearing.  Cardiovascular:     Rate and Rhythm: Normal rate and regular rhythm.     Pulses: Normal pulses.     Heart sounds: No murmur heard.    No friction rub. No gallop.  Pulmonary:     Effort: Pulmonary effort is normal. No respiratory distress.     Breath sounds: No wheezing, rhonchi or rales.  Abdominal:     General: Bowel  sounds are normal. There is no distension.     Palpations: Abdomen is soft.     Tenderness: There is no abdominal tenderness.  Musculoskeletal:     Right lower leg: No edema.     Left lower leg: No edema.  Skin:    General: Skin is warm and dry.     Findings: No rash.  Neurological:     General: No focal deficit present.     Mental Status: She is alert and oriented to person, place, and time.  Psychiatric:        Mood and Affect: Mood normal.        Behavior: Behavior normal.        Assessment & Plan:   Atrial fibrillation (HCC) Her HR is rate controlled.  She is to continue on eliquis at this time.  Essential hypertension Her BP is improved where she was having hypotension.  Dr. Dulce Sellar cut her valsartan downt to 160mg  daily.  She remains on metoprolol succinate 25mg  BID.  Iron deficiency anemia due to chronic blood loss We will check her CBC and obtain iron studies today.  She remains on accufer iron pills.  Bladder tumor She tells me she has a new bladder tumor.  We need to see what her anemia is doing before she goes for surgery.    Return in about 4 weeks (around 09/03/2022).   Crist Fat, MD

## 2022-08-07 DIAGNOSIS — Z7901 Long term (current) use of anticoagulants: Secondary | ICD-10-CM | POA: Diagnosis not present

## 2022-08-07 DIAGNOSIS — Z8249 Family history of ischemic heart disease and other diseases of the circulatory system: Secondary | ICD-10-CM | POA: Diagnosis not present

## 2022-08-07 DIAGNOSIS — K5730 Diverticulosis of large intestine without perforation or abscess without bleeding: Secondary | ICD-10-CM | POA: Diagnosis present

## 2022-08-07 DIAGNOSIS — I10 Essential (primary) hypertension: Secondary | ICD-10-CM | POA: Diagnosis not present

## 2022-08-07 DIAGNOSIS — Z8052 Family history of malignant neoplasm of bladder: Secondary | ICD-10-CM | POA: Diagnosis not present

## 2022-08-07 DIAGNOSIS — D649 Anemia, unspecified: Secondary | ICD-10-CM | POA: Diagnosis present

## 2022-08-07 DIAGNOSIS — D62 Acute posthemorrhagic anemia: Secondary | ICD-10-CM | POA: Diagnosis present

## 2022-08-07 DIAGNOSIS — Z7951 Long term (current) use of inhaled steroids: Secondary | ICD-10-CM | POA: Diagnosis not present

## 2022-08-07 DIAGNOSIS — R310 Gross hematuria: Secondary | ICD-10-CM | POA: Diagnosis present

## 2022-08-07 DIAGNOSIS — D3502 Benign neoplasm of left adrenal gland: Secondary | ICD-10-CM | POA: Diagnosis present

## 2022-08-07 DIAGNOSIS — Z801 Family history of malignant neoplasm of trachea, bronchus and lung: Secondary | ICD-10-CM | POA: Diagnosis not present

## 2022-08-07 DIAGNOSIS — D090 Carcinoma in situ of bladder: Secondary | ICD-10-CM | POA: Diagnosis not present

## 2022-08-07 DIAGNOSIS — D494 Neoplasm of unspecified behavior of bladder: Secondary | ICD-10-CM | POA: Diagnosis not present

## 2022-08-07 DIAGNOSIS — F17210 Nicotine dependence, cigarettes, uncomplicated: Secondary | ICD-10-CM | POA: Diagnosis present

## 2022-08-07 DIAGNOSIS — F17200 Nicotine dependence, unspecified, uncomplicated: Secondary | ICD-10-CM | POA: Diagnosis not present

## 2022-08-07 DIAGNOSIS — E7800 Pure hypercholesterolemia, unspecified: Secondary | ICD-10-CM | POA: Diagnosis present

## 2022-08-07 DIAGNOSIS — I509 Heart failure, unspecified: Secondary | ICD-10-CM | POA: Diagnosis present

## 2022-08-07 DIAGNOSIS — M810 Age-related osteoporosis without current pathological fracture: Secondary | ICD-10-CM | POA: Diagnosis present

## 2022-08-07 DIAGNOSIS — J449 Chronic obstructive pulmonary disease, unspecified: Secondary | ICD-10-CM | POA: Diagnosis present

## 2022-08-07 DIAGNOSIS — I130 Hypertensive heart and chronic kidney disease with heart failure and stage 1 through stage 4 chronic kidney disease, or unspecified chronic kidney disease: Secondary | ICD-10-CM | POA: Diagnosis present

## 2022-08-07 DIAGNOSIS — C679 Malignant neoplasm of bladder, unspecified: Secondary | ICD-10-CM | POA: Diagnosis present

## 2022-08-07 DIAGNOSIS — N281 Cyst of kidney, acquired: Secondary | ICD-10-CM | POA: Diagnosis present

## 2022-08-07 DIAGNOSIS — N183 Chronic kidney disease, stage 3 unspecified: Secondary | ICD-10-CM | POA: Diagnosis present

## 2022-08-07 DIAGNOSIS — I252 Old myocardial infarction: Secondary | ICD-10-CM | POA: Diagnosis not present

## 2022-08-07 DIAGNOSIS — I480 Paroxysmal atrial fibrillation: Secondary | ICD-10-CM | POA: Diagnosis present

## 2022-08-07 DIAGNOSIS — Z79899 Other long term (current) drug therapy: Secondary | ICD-10-CM | POA: Diagnosis not present

## 2022-08-07 DIAGNOSIS — I959 Hypotension, unspecified: Secondary | ICD-10-CM | POA: Diagnosis present

## 2022-08-07 DIAGNOSIS — D509 Iron deficiency anemia, unspecified: Secondary | ICD-10-CM | POA: Diagnosis present

## 2022-08-07 DIAGNOSIS — G4733 Obstructive sleep apnea (adult) (pediatric): Secondary | ICD-10-CM | POA: Diagnosis present

## 2022-08-07 DIAGNOSIS — D630 Anemia in neoplastic disease: Secondary | ICD-10-CM | POA: Diagnosis not present

## 2022-08-07 DIAGNOSIS — C678 Malignant neoplasm of overlapping sites of bladder: Secondary | ICD-10-CM | POA: Diagnosis not present

## 2022-08-07 HISTORY — DX: Anemia, unspecified: D64.9

## 2022-08-07 LAB — CBC WITH DIFFERENTIAL/PLATELET
Basophils Absolute: 0 10*3/uL (ref 0.0–0.2)
Basos: 0 %
EOS (ABSOLUTE): 0.1 10*3/uL (ref 0.0–0.4)
Eos: 1 %
Hematocrit: 20.8 % — ABNORMAL LOW (ref 34.0–46.6)
Hemoglobin: 6.3 g/dL — CL (ref 11.1–15.9)
Immature Grans (Abs): 0.1 10*3/uL (ref 0.0–0.1)
Immature Granulocytes: 1 %
Lymphocytes Absolute: 1.5 10*3/uL (ref 0.7–3.1)
Lymphs: 17 %
MCH: 24.5 pg — ABNORMAL LOW (ref 26.6–33.0)
MCHC: 30.3 g/dL — ABNORMAL LOW (ref 31.5–35.7)
MCV: 81 fL (ref 79–97)
Monocytes Absolute: 0.8 10*3/uL (ref 0.1–0.9)
Monocytes: 9 %
Neutrophils Absolute: 6.3 10*3/uL (ref 1.4–7.0)
Neutrophils: 72 %
Platelets: 186 10*3/uL (ref 150–450)
RBC: 2.57 x10E6/uL — CL (ref 3.77–5.28)
RDW: 15.5 % — ABNORMAL HIGH (ref 11.7–15.4)
WBC: 8.7 10*3/uL (ref 3.4–10.8)

## 2022-08-07 LAB — BASIC METABOLIC PANEL
Anion gap: 7 (ref 5–15)
BUN: 17 mg/dL (ref 8–23)
CO2: 23 mmol/L (ref 22–32)
Calcium: 7.6 mg/dL — ABNORMAL LOW (ref 8.9–10.3)
Chloride: 109 mmol/L (ref 98–111)
Creatinine, Ser: 0.8 mg/dL (ref 0.44–1.00)
GFR, Estimated: >60 mL/min (ref >60)
Glucose, Bld: 107 mg/dL — ABNORMAL HIGH (ref 70–99)
Potassium: 3.7 mmol/L (ref 3.5–5.1)
Sodium: 139 mmol/L (ref 135–145)

## 2022-08-07 LAB — PREPARE RBC (CROSSMATCH)

## 2022-08-07 LAB — IRON AND TIBC
Iron Saturation: 7 % — CL (ref 15–55)
Iron: 17 ug/dL — ABNORMAL LOW (ref 27–139)
Total Iron Binding Capacity: 232 ug/dL — ABNORMAL LOW (ref 250–450)
UIBC: 215 ug/dL (ref 118–369)

## 2022-08-07 LAB — BPAM RBC: ISSUE DATE / TIME: 202405140044

## 2022-08-07 LAB — ABO/RH: ABO/RH(D): O POS

## 2022-08-07 LAB — CBC
HCT: 22.3 % — ABNORMAL LOW (ref 36.0–46.0)
Hemoglobin: 6.7 g/dL — CL (ref 12.0–15.0)
MCH: 25.4 pg — ABNORMAL LOW (ref 26.0–34.0)
MCHC: 30 g/dL (ref 30.0–36.0)
MCV: 84.5 fL (ref 80.0–100.0)
Platelets: 148 10*3/uL — ABNORMAL LOW (ref 150–400)
RBC: 2.64 MIL/uL — ABNORMAL LOW (ref 3.87–5.11)
RDW: 16.8 % — ABNORMAL HIGH (ref 11.5–15.5)
WBC: 8.1 10*3/uL (ref 4.0–10.5)
nRBC: 0 % (ref 0.0–0.2)

## 2022-08-07 LAB — PROTIME-INR
INR: 1.6 — ABNORMAL HIGH (ref 0.8–1.2)
Prothrombin Time: 19.1 seconds — ABNORMAL HIGH (ref 11.4–15.2)

## 2022-08-07 LAB — HEMOGLOBIN AND HEMATOCRIT, BLOOD
HCT: 23.5 % — ABNORMAL LOW (ref 36.0–46.0)
HCT: 24.7 % — ABNORMAL LOW (ref 36.0–46.0)
Hemoglobin: 7.2 g/dL — ABNORMAL LOW (ref 12.0–15.0)
Hemoglobin: 7.5 g/dL — ABNORMAL LOW (ref 12.0–15.0)

## 2022-08-07 LAB — VITAMIN B12: Vitamin B-12: 900 pg/mL (ref 232–1245)

## 2022-08-07 LAB — FERRITIN: Ferritin: 119 ng/mL (ref 15–150)

## 2022-08-07 LAB — TYPE AND SCREEN

## 2022-08-07 LAB — FOLATE RBC

## 2022-08-07 LAB — RETICULOCYTES: Retic Ct Pct: 2.6 % (ref 0.6–2.6)

## 2022-08-07 LAB — APTT: aPTT: 48 seconds — ABNORMAL HIGH (ref 24–36)

## 2022-08-07 MED ORDER — MONTELUKAST SODIUM 10 MG PO TABS
10.0000 mg | ORAL_TABLET | Freq: Every evening | ORAL | Status: DC
  Administered 2022-08-07 – 2022-08-08 (×2): 10 mg via ORAL
  Filled 2022-08-07 (×2): qty 1

## 2022-08-07 MED ORDER — LEVALBUTEROL TARTRATE 45 MCG/ACT IN AERO: 1.0000 | INHALATION_SPRAY | RESPIRATORY_TRACT | Status: DC | PRN

## 2022-08-07 MED ORDER — SODIUM CHLORIDE 0.9 % IV SOLN
1.0000 g | Freq: Once | INTRAVENOUS | Status: AC
  Administered 2022-08-07: 1 g via INTRAVENOUS
  Filled 2022-08-07: qty 10

## 2022-08-07 MED ORDER — LEVALBUTEROL HCL 0.63 MG/3ML IN NEBU: 0.6300 mg | INHALATION_SOLUTION | Freq: Four times a day (QID) | RESPIRATORY_TRACT | Status: DC | PRN

## 2022-08-07 MED ORDER — SODIUM CHLORIDE 0.9% IV SOLUTION: Freq: Once | INTRAVENOUS | Status: DC

## 2022-08-07 MED ORDER — ACETAMINOPHEN 325 MG PO TABS
650.0000 mg | ORAL_TABLET | Freq: Four times a day (QID) | ORAL | Status: DC | PRN
  Administered 2022-08-07 – 2022-08-08 (×2): 650 mg via ORAL
  Filled 2022-08-07 (×2): qty 2

## 2022-08-07 MED ORDER — METHOCARBAMOL 500 MG PO TABS
250.0000 mg | ORAL_TABLET | Freq: Once | ORAL | Status: AC
  Administered 2022-08-07: 250 mg via ORAL
  Filled 2022-08-07: qty 1

## 2022-08-07 MED ORDER — FLUTICASONE PROPIONATE 50 MCG/ACT NA SUSP
2.0000 | Freq: Every day | NASAL | Status: DC
  Administered 2022-08-07 – 2022-08-09 (×3): 2 via NASAL
  Filled 2022-08-07: qty 16

## 2022-08-07 MED ORDER — UMECLIDINIUM BROMIDE 62.5 MCG/ACT IN AEPB
1.0000 | INHALATION_SPRAY | Freq: Every day | RESPIRATORY_TRACT | Status: DC
  Administered 2022-08-07 – 2022-08-09 (×3): 1 via RESPIRATORY_TRACT
  Filled 2022-08-07: qty 7

## 2022-08-07 MED ORDER — METHOCARBAMOL 500 MG PO TABS
500.0000 mg | ORAL_TABLET | Freq: Three times a day (TID) | ORAL | Status: DC
  Administered 2022-08-07 – 2022-08-09 (×8): 500 mg via ORAL
  Filled 2022-08-07 (×8): qty 1

## 2022-08-07 MED ORDER — FLECAINIDE ACETATE 50 MG PO TABS
50.0000 mg | ORAL_TABLET | Freq: Two times a day (BID) | ORAL | Status: DC
  Administered 2022-08-07 – 2022-08-09 (×5): 50 mg via ORAL
  Filled 2022-08-07 (×6): qty 1

## 2022-08-07 MED ORDER — PANTOPRAZOLE SODIUM 40 MG PO TBEC
40.0000 mg | DELAYED_RELEASE_TABLET | Freq: Every day | ORAL | Status: DC
  Administered 2022-08-07 – 2022-08-09 (×3): 40 mg via ORAL
  Filled 2022-08-07 (×3): qty 1

## 2022-08-07 MED ORDER — MOMETASONE FURO-FORMOTEROL FUM 200-5 MCG/ACT IN AERO
2.0000 | INHALATION_SPRAY | Freq: Two times a day (BID) | RESPIRATORY_TRACT | Status: DC
  Administered 2022-08-07 – 2022-08-09 (×5): 2 via RESPIRATORY_TRACT
  Filled 2022-08-07: qty 8.8

## 2022-08-07 MED ORDER — ROSUVASTATIN CALCIUM 5 MG PO TABS
5.0000 mg | ORAL_TABLET | ORAL | Status: DC
  Administered 2022-08-07 – 2022-08-09 (×2): 5 mg via ORAL
  Filled 2022-08-07 (×2): qty 1

## 2022-08-07 MED ORDER — PANTOPRAZOLE SODIUM 40 MG IV SOLR
40.0000 mg | Freq: Once | INTRAVENOUS | Status: AC
  Administered 2022-08-07: 40 mg via INTRAVENOUS
  Filled 2022-08-07: qty 10

## 2022-08-07 MED ORDER — SODIUM CHLORIDE 0.9 % IV SOLN: INTRAVENOUS | Status: AC

## 2022-08-07 MED ORDER — ACETAMINOPHEN 650 MG RE SUPP: 650.0000 mg | Freq: Four times a day (QID) | RECTAL | Status: DC | PRN

## 2022-08-07 MED ORDER — ONDANSETRON HCL 4 MG/2ML IJ SOLN: 4.0000 mg | Freq: Four times a day (QID) | INTRAMUSCULAR | Status: DC | PRN

## 2022-08-07 MED ORDER — METOPROLOL SUCCINATE ER 25 MG PO TB24
25.0000 mg | ORAL_TABLET | Freq: Every day | ORAL | Status: DC
  Administered 2022-08-07 – 2022-08-09 (×3): 25 mg via ORAL
  Filled 2022-08-07 (×3): qty 1

## 2022-08-07 MED ORDER — ONDANSETRON HCL 4 MG PO TABS: 4.0000 mg | ORAL_TABLET | Freq: Four times a day (QID) | ORAL | Status: DC | PRN

## 2022-08-07 MED ORDER — TRAMADOL HCL 50 MG PO TABS
50.0000 mg | ORAL_TABLET | Freq: Four times a day (QID) | ORAL | Status: DC | PRN
  Administered 2022-08-07 – 2022-08-08 (×4): 50 mg via ORAL
  Filled 2022-08-07 (×4): qty 1

## 2022-08-07 NOTE — Consult Note (Signed)
Urology Consult  Referring physician: Dr. Quincy Sheehan Reason for referral: Hematuria  Chief Complaint: Hematuria and likely recurrence of bladder cancer  History of Present Illness: Dana Horton is a 77 year old female presenting to the emergency department with complaints of blood in urine and right-sided flank pain.  PMH significant for A-fib on Eliquis, diverticulosis, CKD stage III, hypertension, bladder cancer-s/p TURBT, COPD, OSA, and tobacco abuse.  She has been having hematuria on and off since Easter per her report. She has been seen at [facility] for hematuria which resulted in transfer to [facility] and admission from 3/30 through 4/2.  At that time bleeding was undifferentiated and she was worked up for GI bleed.  She reports that her urologist retired and she was without a provider for some time.  She eventually was seen by Dr. Theodoro Grist in [place] who did find recurrence of bladder tumor during cystoscopy.  The plan was to resect her tumor once her Holter monitor was off.  Unfortunately she has had significant acute blood loss anemia since then and presented to the hospital with Hgb of 6.3, requiring multiple transfusions.  [facility] was consulted to consider TURBT.  Past Medical History:  Diagnosis Date   Allergy    Atrial fibrillation (HCC)    CHF (congestive heart failure) (HCC)    CKD (chronic kidney disease) stage 3, GFR 30-59 ml/min (HCC)    COPD (chronic obstructive pulmonary disease) (HCC)    Essential hypertension    History of bladder cancer    Lung nodule    Myocardial infarction (HCC)    ? small with atrial fib    Osteoporosis    Seasonal allergies    Sleep apnea    cpap  setting at 2.5 per patient    Tobacco abuse    Past Surgical History:  Procedure Laterality Date   ABDOMINAL HYSTERECTOMY     BLADDER SURGERY     COLONOSCOPY N/A 12/28/2016   Procedure: COLONOSCOPY;  Surgeon: Sherrilyn Rist, MD;  Location: [facility];  Service:  Gastroenterology;  Laterality: N/A;   LEFT HEART CATH AND CORONARY ANGIOGRAPHY N/A 04/09/2017   Procedure: LEFT HEART CATH AND CORONARY ANGIOGRAPHY;  Surgeon: Corky Crafts, MD;  [facility];  Service: Cardiovascular;  Laterality: N/A;   MOUTH SURGERY     POLYPECTOMY N/A 12/28/2016   Procedure: POLYPECTOMY with injection of Elevue;  Surgeon: Sherrilyn Rist, MD;  Location: [facility];  Service: Gastroenterology;  Laterality: N/A;    Allergies:  Allergies  Allergen Reactions   Albuterol Palpitations   Lisinopril Cough    Family History  Problem Relation Age of Onset   Hypertension Mother    Stroke Mother    Cancer Father    Heart disease Father    CAD Father    Arrhythmia Father    Bladder Cancer Brother    Lung cancer Brother    Colon cancer Neg Hx    Esophageal cancer Neg Hx    Stomach cancer Neg Hx    Colon polyps Neg Hx    Social History:  reports that she has been smoking cigarettes. She has a 26.00 pack-year smoking history. She has never used smokeless tobacco. She reports that she does not drink alcohol and does not use drugs.  ROS: All systems are reviewed and negative except as noted. Bilateral flank pain  Physical Exam:  Vital signs in last 24 hours: Temp:  [98 F (36.7 C)-98.6 F (37 C)] 98.1 F (  36.7 C) (05/14 0619) Pulse Rate:  [74-86] 75 (05/14 0619) Resp:  [16-21] 18 (05/14 0619) BP: (96-126)/(45-59) 116/53 (05/14 0619) SpO2:  [96 %-100 %] 98 % (05/14 0857) Weight:  [67.6 kg-67.9 kg] 67.6 kg (05/13 2217)  Cardiovascular: Skin warm; not flushed Respiratory: Breaths quiet; no shortness of breath Abdomen: No masses, soft, no guarding or rebound Neurological: Normal sensation to touch Musculoskeletal: Normal motor function arms and legs Skin: No rashes Genitourinary: Reports hematuria.  No sample available. Foley catheter not in place  Laboratory Data:  Results for orders placed or performed during the hospital encounter of  08/06/22 (from the past 72 hour(s))  Urinalysis, Routine w reflex microscopic -Urine, Clean Catch     Status: Abnormal   Collection Time: 08/06/22 10:19 PM  Result Value Ref Range   Color, Urine YELLOW YELLOW   APPearance HAZY (A) CLEAR   Specific Gravity, Urine 1.023 1.005 - 1.030   pH 5.0 5.0 - 8.0   Glucose, UA NEGATIVE NEGATIVE mg/dL   Hgb urine dipstick LARGE (A) NEGATIVE   Bilirubin Urine NEGATIVE NEGATIVE   Ketones, ur NEGATIVE NEGATIVE mg/dL   Protein, ur 30 (A) NEGATIVE mg/dL   Nitrite NEGATIVE NEGATIVE   Leukocytes,Ua TRACE (A) NEGATIVE   RBC / HPF >50 0 - 5 RBC/hpf   WBC, UA 11-20 0 - 5 WBC/hpf   Bacteria, UA FEW (A) NONE SEEN   Squamous Epithelial / HPF 0-5 0 - 5 /HPF   WBC Clumps PRESENT    Mucus PRESENT    Budding Yeast PRESENT    Hyaline Casts, UA PRESENT    Non Squamous Epithelial 0-5 (A) NONE SEEN    Comment: Performed at [facility], [address] [place], [place] [address]  Basic metabolic panel     Status: Abnormal   Collection Time: 08/06/22 10:37 PM  Result Value Ref Range   Sodium 141 135 - 145 mmol/L   Potassium 4.2 3.5 - 5.1 mmol/L   Chloride 109 98 - 111 mmol/L   CO2 22 22 - 32 mmol/L   Glucose, Bld 134 (H) 70 - 99 mg/dL    Comment: Glucose reference range applies only to samples taken after fasting for at least 8 hours.   BUN 22 8 - 23 mg/dL   Creatinine, Ser 6.04 (H) 0.44 - 1.00 mg/dL   Calcium 8.6 (L) 8.9 - 10.3 mg/dL   GFR, Estimated 53 (L) >60 mL/min    Comment: (NOTE) Calculated using the CKD-EPI Creatinine Equation (2021)    Anion gap 10 5 - 15    Comment: Performed at [facility], [address], [place], [place] [address]  CBC     Status: Abnormal   Collection Time: 08/06/22 10:37 PM  Result Value Ref Range   WBC 8.6 4.0 - 10.5 K/uL   RBC 2.54 (L) 3.87 - 5.11 MIL/uL   Hemoglobin 6.4 (LL) 12.0 - 15.0 g/dL    Comment: REPEATED TO VERIFY THIS CRITICAL RESULT HAS VERIFIED AND BEEN CALLED TO S.  CARTER,RN BY ATCHISON,MARY ON 05 13 2024 AT 2305, AND HAS BEEN READ BACK.     HCT 21.4 (L) 36.0 - 46.0 %   MCV 84.3 80.0 - 100.0 fL   MCH 25.2 (L) 26.0 - 34.0 pg   MCHC 29.9 (L) 30.0 - 36.0 g/dL   RDW 11.9 (H) 14.7 - 82.9 %   Platelets 175 150 - 400 K/uL   nRBC 0.0 0.0 - 0.2 %    Comment:  Performed at [facility], [address] [place], [place] [address]  Protime-INR     Status: Abnormal   Collection Time: 08/06/22 10:37 PM  Result Value Ref Range   Prothrombin Time 19.1 (H) 11.4 - 15.2 seconds   INR 1.6 (H) 0.8 - 1.2    Comment: (NOTE) INR goal varies based on device and disease states. Performed at [facility], [address], [place], [place] [address]   APTT     Status: Abnormal   Collection Time: 08/06/22 10:37 PM  Result Value Ref Range   aPTT 48 (H) 24 - 36 seconds    Comment:        IF BASELINE aPTT IS ELEVATED, SUGGEST PATIENT RISK ASSESSMENT BE USED TO DETERMINE APPROPRIATE ANTICOAGULANT THERAPY. Performed at [facility], [address] [place], [place] [address]   ABO/Rh     Status: None   Collection Time: 08/06/22 10:37 PM  Result Value Ref Range   ABO/RH(D)      O POS Performed at [facility], [address], [place], [place] [address]   Type and screen [facility]     Status: None (Preliminary result)   Collection Time: 08/06/22 11:28 PM  Result Value Ref Range   ABO/RH(D) O POS    Antibody Screen NEG    Sample Expiration 08/09/2022,2359    Unit Number [id number]    Blood Component Type RED CELLS,LR    Unit division 00    Status of Unit ISSUED    Transfusion Status OK TO TRANSFUSE    Crossmatch Result      Compatible Performed at [facility], [address] [place], [place] [address]    Unit Number [id number]    Blood Component Type RED CELLS,LR    Unit division 00    Status of Unit ALLOCATED    Transfusion Status OK TO  TRANSFUSE    Crossmatch Result Compatible   Prepare RBC (crossmatch)     Status: None   Collection Time: 08/06/22 11:28 PM  Result Value Ref Range   Order Confirmation      ORDER PROCESSED BY BLOOD BANK Performed at [facility], [address], [place], [place] [address]   Basic metabolic panel     Status: Abnormal   Collection Time: 08/07/22  5:33 AM  Result Value Ref Range   Sodium 139 135 - 145 mmol/L   Potassium 3.7 3.5 - 5.1 mmol/L   Chloride 109 98 - 111 mmol/L   CO2 23 22 - 32 mmol/L   Glucose, Bld 107 (H) 70 - 99 mg/dL    Comment: Glucose reference range applies only to samples taken after fasting for at least 8 hours.   BUN 17 8 - 23 mg/dL   Creatinine, Ser 6.64 0.44 - 1.00 mg/dL   Calcium 7.6 (L) 8.9 - 10.3 mg/dL   GFR, Estimated >40 >34 mL/min    Comment: (NOTE) Calculated using the CKD-EPI Creatinine Equation (2021)    Anion gap 7 5 - 15    Comment: Performed at [facility], [address], [place], [place] [address]  CBC     Status: Abnormal   Collection Time: 08/07/22  5:33 AM  Result Value Ref Range   WBC 8.1 4.0 - 10.5 K/uL   RBC 2.64 (L) 3.87 - 5.11 MIL/uL   Hemoglobin 6.7 (LL) 12.0 - 15.0 g/dL    Comment: CRITICAL VALUE NOTED.  VALUE IS CONSISTENT WITH PREVIOUSLY REPORTED AND CALLED VALUE. REPEATED TO VERIFY    HCT 22.3 (L) 36.0 - 46.0 %   MCV 84.5 80.0 - 100.0 fL   MCH 25.4 (L) 26.0 - 34.0 pg   MCHC 30.0 30.0 - 36.0 g/dL   RDW 16.1 (H) 09.6 - 04.5 %   Platelets 148 (L) 150 - 400 K/uL   nRBC 0.0 0.0 - 0.2 %    Comment: Performed at [facility], [address] [place], [place] [address]  Hemoglobin and hematocrit, blood     Status: Abnormal   Collection Time: 08/07/22  9:25 AM  Result Value Ref Range   Hemoglobin 7.2 (L) 12.0 - 15.0 g/dL   HCT 19.1 (L) 47.8 - 29.5 %    Comment: Performed at [facility], [address], [place], [place] [address]   No results found for  this or any previous visit (from the past 240 hour(s)). Creatinine: Recent Labs    08/06/22 2237 08/07/22 0533  CREATININE 1.09* 0.80    Imaging: See report/chart   Assessment/Plan:  Last dose of Eliquis was 7 AM on 5/13.  Will require another 24 hours of washout prior to being eligible for surgical intervention. Her hemoglobin came up slightly with transfusion but is still below 7.  She will require more PRBC today. N.p.o. at midnight.  Pending her stability she will go back for tumor resection with Dr. Berneice Heinrich at his first availability tomorrow afternoon.  Scherrie Bateman Dajanay Northrup 08/07/2022, 10:32 AM  Pager: [phone]

## 2022-08-07 NOTE — Hospital Course (Addendum)
77 year old female with multiple medical problems including atrial fibrillation on Eliquis, hypertension, anemia, bladder tumor, anemia presents to the ED with blood in the urine and flank pain on right and left side also having neck pain. She is having hematuria on and off since ester.  She reports she went to [place] [facility] due to her hematuria and ended up being transferred to [facility] and admitted 3/30-4/2 they did all sorts of tests she states. On chart review she had undergone extensive workup with CT of abdomen pelvis no acute finding subsequent underwent EGD and colonoscopy did not need blood transfusion.  Recently seen by pcp cardio for hypotension following her discharge seen by Dr. Dulce Sellar and placed on Holter monitoring on 5/3, also seen by PCP recently last 1 being on 5/13- adjusted her flecainide level for her A. Fib and decreased her valsartan to 160mg  daily. Rectal exam unremarkable in ED, no BRBPR. She reports recently in  early May she had cystoscopy done by Dr Theodoro Grist in [place] and was told  she has recurrence of bladder tumor and was planning for tumor resection once her heart monitor is off.   In the ED vitals stable with BP 106/59, no hypoxia or tachycardia.  Labs showed hemoglobin at 6.4 g previously 8.0 on 5/1, BUN 22 creatinine 1.0.  UA with RBC more than 50 WBC 11-20.  CT renal study shows no acute finding abdomen pelvis colonic diverticulosis without evidence of diverticulitis, stable left adrenal nodule, stable mildly hyperdense lesion in the left kidney findings represent a proteinaceous cyst. Patient was given PPI IV, normal saline, ceftriaxone and 1 unit PRBC ordered and admission was requested for further management.

## 2022-08-07 NOTE — H&P (Signed)
History and Physical    Dana Horton Thang ZOX:[id number] DOB: 10-02-1945 DOA: 08/06/2022  PCP: Crist Fat, MD   Patient coming from: home Chief Complaint  Patient presents with   Flank Pain   Hematuria     HPI: 77 year old female with multiple medical problems including atrial fibrillation on Eliquis, hypertension, anemia, bladder tumor, anemia presents to the ED with blood in the urine and flank pain on right and left side also having neck pain. She is having hematuria on and off since ester.  She reports she went to [place] [facility] due to her hematuria and ended up being transferred to [facility] and admitted 3/30-4/2 they did all sorts of tests she states. On chart review she had undergone extensive workup with CT of abdomen pelvis no acute finding subsequent underwent EGD and colonoscopy did not need blood transfusion.  Recently seen by pcp cardio for hypotension following her discharge seen by Dr. Dulce Sellar and placed on Holter monitoring on 5/3, also seen by PCP recently last 1 being on 5/13- adjusted her flecainide level for her A. Fib and decreased her valsartan to 160mg  daily. Rectal exam unremarkable in ED, no BRBPR. She reports recently in  early May she had cystoscopy done by Dr Theodoro Grist in [place] and was told  she has recurrence of bladder tumor and was planning for tumor resection once her heart monitor is off.   In the ED vitals stable with BP 106/59, no hypoxia or tachycardia.  Labs showed hemoglobin at 6.4 g previously 8.0 on 5/1, BUN 22 creatinine 1.0.  UA with RBC more than 50 WBC 11-20.  CT renal study shows no acute finding abdomen pelvis colonic diverticulosis without evidence of diverticulitis, stable left adrenal nodule, stable mildly hyperdense lesion in the left kidney findings represent a proteinaceous cyst. Patient was given PPI IV, normal saline, ceftriaxone and 1 unit PRBC ordered and admission was requested for further management.  Patient otherwise denies  any nausea, vomiting, chest pain, shortness of breath, fever, chills, headache, focal weakness, numbness tingling, speech difficulties  She does have leg swelling and takes lasix.  Assessment/Plan  Symptomatic anemia Hematuria History of bladder tumor: Patient complains of on and off hematuria since Easter and had cystoscopy recently in [place] showing recurrence of her bladder tumor.  CT renal study is unremarkable unable to void in the ED.  Transfuse monitor PRBC check H&H posttransfusion, will need urology consult in the morning, keep n.p.o. p for now avoid anticoagulation.  Will hold Eliquis for now- she is agreeable.  History of ?GI bleeding: She has had extensive workup including CT angio, [facility]. Reports she has brown and dark stool- and attributes to oral iron  Essential hypertension: BP soft hold valsartan, hold lasix for now.   OSA on CPAP COPD: Continue supplemental oxygen as needed, nebulizer, Singulair.  PAF: Monitoring telemetry, hold Eliquis.  Resume her flecainide, metoprolol.   Hypercholesterolemia: Continue statin.  Body mass index is 25.98 kg/m.   Severity of Illness: The appropriate patient status for this patient is INPATIENT. Inpatient status is judged to be reasonable and necessary in order to provide the required intensity of service to ensure the patient's safety. The patient's presenting symptoms, physical exam findings, and initial radiographic and laboratory data in the context of their chronic comorbidities is felt to place them at high risk for further clinical deterioration. Furthermore, it is not anticipated that the patient will be medically stable for discharge from the hospital within 2  midnights of admission.   * I certify that at the point of admission it is my clinical judgment that the patient will require inpatient hospital care spanning beyond 2 midnights from the point of admission due to high intensity of service,  high risk for further deterioration and high frequency of surveillance required.*   DVT prophylaxis: SCDs Start: 08/07/22 0041 Code Status:   Code Status: Full Code lived with her disabled husband Family Communication: Admission, patients condition and plan of care including tests being ordered have been discussed with the patient who indicate understanding and agree with the plan and Code Status.  Consults called:  none  Review of Systems: All systems were reviewed and were negative except as mentioned in HPI above. Negative for fever Negative for chest pain Negative for shortness of breath  Past Medical History:  Diagnosis Date   Allergy    Atrial fibrillation (HCC)    CHF (congestive heart failure) (HCC)    CKD (chronic kidney disease) stage 3, GFR 30-59 ml/min (HCC)    COPD (chronic obstructive pulmonary disease) (HCC)    Essential hypertension    History of bladder cancer    Lung nodule    Myocardial infarction (HCC)    ? small with atrial fib    Osteoporosis    Seasonal allergies    Sleep apnea    cpap  setting at 2.5 per patient    Tobacco abuse     Past Surgical History:  Procedure Laterality Date   ABDOMINAL HYSTERECTOMY     BLADDER SURGERY     COLONOSCOPY N/A 12/28/2016   Procedure: COLONOSCOPY;  Surgeon: Sherrilyn Rist, MD;  Location: [facility];  Service: Gastroenterology;  Laterality: N/A;   LEFT HEART CATH AND CORONARY ANGIOGRAPHY N/A 04/09/2017   Procedure: LEFT HEART CATH AND CORONARY ANGIOGRAPHY;  Surgeon: Corky Crafts, MD;  Location: [facility];  Service: Cardiovascular;  Laterality: N/A;   MOUTH SURGERY     POLYPECTOMY N/A 12/28/2016   Procedure: POLYPECTOMY with injection of Elevue;  Surgeon: Sherrilyn Rist, MD;  Location: [facility];  Service: Gastroenterology;  Laterality: N/A;     reports that she has been smoking cigarettes. She has a 26.00 pack-year smoking history. She has never used smokeless tobacco. She reports that  she does not drink alcohol and does not use drugs.  Allergies  Allergen Reactions   Albuterol Palpitations   Lisinopril Cough    Family History  Problem Relation Age of Onset   Hypertension Mother    Stroke Mother    Cancer Father    Heart disease Father    CAD Father    Arrhythmia Father    Bladder Cancer Brother    Lung cancer Brother    Colon cancer Neg Hx    Esophageal cancer Neg Hx    Stomach cancer Neg Hx    Colon polyps Neg Hx      Prior to Admission medications   Medication Sig Start Date End Date Taking? Authorizing Provider  apixaban (ELIQUIS) 5 MG TABS tablet Take 1 tablet (5 mg total) by mouth 2 (two) times daily. 07/25/22   Baldo Daub, MD  cetirizine (ZYRTEC) 10 MG tablet Take 10 mg by mouth daily as needed for allergies.    [provider]  Ferric Maltol 30 MG CAPS Take 1 capsule (30 mg total) by mouth daily. 06/29/22   Crist Fat, MD  flecainide (TAMBOCOR) 50 MG tablet Take 1 tablet (50 mg  total) by mouth 2 (two) times daily. 07/25/22   Baldo Daub, MD  fluticasone (FLONASE) 50 MCG/ACT nasal spray Place 2 sprays into both nostrils daily. 03/07/22   Crist Fat, MD  furosemide (LASIX) 20 MG tablet TAKE 1 TABLET BY MOUTH DAILY IF NEEDED FOR SHORTNESS OF BREATH OR LOWER EXTEMITY EDEMA Patient not taking: Reported on 07/25/2022 10/28/17   Chilton Si, MD  levalbuterol [facility] HFA) 45 MCG/ACT inhaler Inhale 1-2 puffs into the lungs every 4 (four) hours as needed for wheezing. 05/21/22   Crist Fat, MD  metoprolol succinate (TOPROL-XL) 25 MG 24 hr tablet Take 1 tablet (25 mg total) by mouth in the morning and at bedtime. 08/06/22   Crist Fat, MD  montelukast (SINGULAIR) 10 MG tablet Take 1 tablet (10 mg total) by mouth every evening. 08/06/22   Crist Fat, MD  pantoprazole (PROTONIX) 40 MG tablet Take 40 mg by mouth daily. 06/26/22 09/24/22  [provider]  rosuvastatin (CRESTOR) 5 MG tablet Take 5 mg by mouth every other day.     [provider]  SYMBICORT 160-4.5 MCG/ACT inhaler Inhale 2 puffs into the lungs 2 (two) times daily. 05/21/22   Crist Fat, MD  valsartan (DIOVAN) 160 MG tablet Take 1 tablet (160 mg total) by mouth daily. 07/25/22   Baldo Daub, MD    Physical Exam: Vitals:   08/06/22 2216 08/06/22 2217  BP: (!) 106/59   Pulse: 74   Resp: 18   Temp: 98.6 F (37 C)   TempSrc: Oral   SpO2: 100%   Weight:  67.6 kg  Height:  5' 3.5" (1.613 m)    General exam: AAO x3, NAD, weak appearing. HEENT:Oral mucosa moist, Ear/Nose WNL grossly, dentition normal. Respiratory system: bilaterally clear,no wheezing or crackles,no use of accessory muscle Cardiovascular system: S1 & S2 +, No JVD,. Gastrointestinal system: Abdomen soft, NT,ND, BS+ Nervous System:Alert, awake, moving extremities and grossly nonfocal Extremities: mild pitting ankle edema, distal peripheral pulses palpable.  Skin: No rashes,no icterus. MSK: Normal muscle bulk,tone, power   Labs on Admission: I have personally reviewed following labs and imaging studies  CBC: Recent Labs  Lab 08/06/22 2237  WBC 8.6  HGB 6.4*  HCT 21.4*  MCV 84.3  PLT 175   Basic Metabolic Panel: Recent Labs  Lab 08/06/22 2237  NA 141  K 4.2  CL 109  CO2 22  GLUCOSE 134*  BUN 22  CREATININE 1.09*  CALCIUM 8.6*   Recent Labs  Lab 08/06/22 2237  INR 1.6*      Component Value Date/Time   COLORURINE YELLOW 08/06/2022 2219   APPEARANCEUR HAZY (A) 08/06/2022 2219   LABSPEC 1.023 08/06/2022 2219   PHURINE 5.0 08/06/2022 2219   GLUCOSEU NEGATIVE 08/06/2022 2219   HGBUR LARGE (A) 08/06/2022 2219   BILIRUBINUR NEGATIVE 08/06/2022 2219   KETONESUR NEGATIVE 08/06/2022 2219   PROTEINUR 30 (A) 08/06/2022 2219   NITRITE NEGATIVE 08/06/2022 2219   LEUKOCYTESUR TRACE (A) 08/06/2022 2219    Radiological Exams on Admission: CT Renal Stone Study  Result Date: 08/06/2022 CLINICAL DATA:  Concern for bladder hemorrhage. Flank pain. History  of bladder cancer. EXAM: CT ABDOMEN AND PELVIS WITHOUT CONTRAST TECHNIQUE: Multidetector CT imaging of the abdomen and pelvis was performed following the standard protocol without IV contrast. RADIATION DOSE REDUCTION: This exam was performed according to the departmental dose-optimization program which includes automated exposure control, adjustment of the mA and/or kV according to patient size and/or use  of iterative reconstruction technique. COMPARISON:  CTA abdomen and pelvis 06/22/2022. CT abdomen and pelvis 05/27/2015. 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 Hepatobiliary: 1 cm rounded hypodensity in the left lobe is unchanged, possibly a cyst or hemangioma. No new liver lesions are seen allowing for lack of intravenous contrast. Gallbladder and bile ducts are within normal limits. Pancreas: Unremarkable. No pancreatic ductal dilatation or surrounding inflammatory changes. Spleen: Normal in size without focal abnormality. Adrenals/Urinary Tract: 3.6 by 3.3 cm left adrenal nodule is unchanged. Right adrenal gland is within normal limits. There is no hydronephrosis or perinephric fat stranding. Bilateral renal cortical cysts are again noted. Mildly hyperdense areas in the left kidney measuring 2.8 cm image 2/32 and 1.9 cm image 2/25 appear unchanged from the prior examination. No urinary tract calculi are seen. The bladder is under distended unremarkable. Stomach/Bowel: Stomach is within normal limits. Appendix appears normal. No evidence of bowel wall thickening, distention, or inflammatory changes. There is diffuse colonic diverticulosis. Vascular/Lymphatic: Aortic atherosclerosis. No enlarged abdominal or pelvic lymph nodes. Reproductive: Status post hysterectomy. No adnexal masses. Other: No abdominal wall hernia or abnormality. No abdominopelvic ascites. Musculoskeletal: No acute or significant osseous findings. IMPRESSION: 1. No acute process in the abdomen or pelvis. 2.  Colonic diverticulosis without evidence for diverticulitis. 3. 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 4. Stable mildly hyperdense lesions in the left kidney, findings may represent a proteinaceous cyst. Aortic Atherosclerosis (ICD10-I70.0). Electronically Signed   By: Darliss Cheney M.D.   On: 08/06/2022 23:54    Lanae Boast MD [facility]  If 7PM-7AM, please contact night-coverage [email or web address]  08/07/2022, 12:44 AM

## 2022-08-07 NOTE — Progress Notes (Signed)
Progress Note   Patient: Dana Horton VHQ:[id number] DOB: June 01, 1945 DOA: 08/06/2022     0 DOS: the patient was seen and examined on 08/07/2022   Brief hospital course: 77 year old female with multiple medical problems including atrial fibrillation on Eliquis, hypertension, anemia, bladder tumor, anemia presents to the ED with blood in the urine and flank pain on right and left side also having neck pain. She is having hematuria on and off since ester.  She reports she went to [place] [facility] due to her hematuria and ended up being transferred to [facility] and admitted 3/30-4/2 they did all sorts of tests she states. On chart review she had undergone extensive workup with CT of abdomen pelvis no acute finding subsequent underwent EGD and colonoscopy did not need blood transfusion.  Recently seen by pcp cardio for hypotension following her discharge seen by Dr. Dulce Sellar and placed on Holter monitoring on 5/3, also seen by PCP recently last 1 being on 5/13- adjusted her flecainide level for her A. Fib and decreased her valsartan to 160mg  daily. Rectal exam unremarkable in ED, no BRBPR. She reports recently in  early May she had cystoscopy done by Dr Theodoro Grist in [place] and was told  she has recurrence of bladder tumor and was planning for tumor resection once her heart monitor is off.  In the emergency room patient's hemoglobin was 6.4, CT renal study did not show any acute findings.  Patient was transfused with 1 unit of packed red blood cell.  Patient was also given IV PPI.  Patient had hemoglobin this morning is 6.7 even after transfusion.  Patient continues to complain of neck pain in the left side.  Difficulty with turning the head to the left side.     Assessment and Plan:  Symptomatic anemia Hematuria History of bladder tumor: Patient complains of on and off hematuria since Easter and had cystoscopy recently in [place] showing recurrence of her bladder tumor.   CT renal study is  unremarkable unable to void in the ED.  Status post transfusion overnight of packed red blood cell. Hemoglobin posttransfusion still 6.7.  Ordered 1 more unit for transfusion. Urology is consulted, holding Eliquis plan for cystoscopy tomorrow N.p.o. at midnight.    History of ?GI bleeding: She has had extensive workup including CT angio, [facility]. Reports she has brown and dark stool- and attributes to oral iron.   Essential hypertension: BP soft hold valsartan, hold lasix for now.   OSA on CPAP COPD: Continue supplemental oxygen as needed, nebulizer, Singulair.   PAF: Monitoring telemetry, hold Eliquis.  Resume her flecainide, metoprolol.    Hypercholesterolemia: Continue statin.   Body mass index is 25.98 kg/m.      Subjective: Patient was seen and examined bedside today.  Patient is complaining of neck pain on the left lateral side difficulty with ending the head to the left.  Patient is not sure if she is still continuing to have hematuria. Physical Exam: Vitals:   08/07/22 0345 08/07/22 0619 08/07/22 0857 08/07/22 1054  BP: (!) 117/48 (!) 116/53  (!) 128/48  Pulse: 80 75  71  Resp: 18 18  18   Temp: 98.2 F (36.8 C) 98.1 F (36.7 C)  97.9 F (36.6 C)  TempSrc: Oral Oral  Oral  SpO2: 100% 98% 98% 93%  Weight:      Height:       General exam: AAO x3, NAD, weak appearing. HEENT:Oral mucosa moist, Ear/Nose WNL grossly, dentition normal.  Respiratory system: bilaterally clear,no wheezing or crackles,no use of accessory muscle Cardiovascular system: S1 & S2 +, No JVD,. Gastrointestinal system: Abdomen soft, NT,ND, BS+ Nervous System:Alert, awake, moving extremities and grossly nonfocal Extremities: mild pitting ankle edema, distal peripheral pulses palpable.  Skin: No rashes,no icterus. MSK: Normal muscle bulk,tone, power  Data Reviewed:  Reviewed labs, CBC shows hemoglobin of 6.7.  Family Communication: Patient alert oriented, no family  member at bedside  Disposition: Status is: Inpatient Remains inpatient appropriate because: Anemia, hematuria, significant neck pain  Planned Discharge Destination: Home with Home Health    Time spent: 35 minutes  Author: Harold Hedge, MD 08/07/2022 1:57 PM  For on call review [email or web address].

## 2022-08-08 ENCOUNTER — Other Ambulatory Visit: Payer: Self-pay | Admitting: Internal Medicine

## 2022-08-08 DIAGNOSIS — D649 Anemia, unspecified: Secondary | ICD-10-CM | POA: Diagnosis not present

## 2022-08-08 LAB — TYPE AND SCREEN
ABO/RH(D): O POS
Antibody Screen: NEGATIVE

## 2022-08-08 LAB — BPAM RBC
Blood Product Expiration Date: 202406132359
Unit Type and Rh: 5100

## 2022-08-08 MED ORDER — MELATONIN 3 MG PO TABS
6.0000 mg | ORAL_TABLET | Freq: Every evening | ORAL | Status: DC | PRN
  Administered 2022-08-08: 6 mg via ORAL
  Filled 2022-08-08: qty 2

## 2022-08-08 MED ORDER — FERROUS SULFATE 300 (60 FE) MG/5ML PO SOLN
300.0000 mg | Freq: Every day | ORAL | Status: DC
  Administered 2022-08-08 – 2022-08-09 (×2): 300 mg via ORAL
  Filled 2022-08-08 (×2): qty 5

## 2022-08-08 MED ORDER — PREGABALIN 50 MG PO CAPS
50.0000 mg | ORAL_CAPSULE | Freq: Two times a day (BID) | ORAL | Status: DC
  Administered 2022-08-08 – 2022-08-09 (×3): 50 mg via ORAL
  Filled 2022-08-08 (×3): qty 1

## 2022-08-08 MED ORDER — FERROUS SULFATE NICU 15 MG (ELEMENTAL IRON)/ML: 1.0000 mg/kg | Freq: Every day | ORAL | Status: DC

## 2022-08-08 NOTE — Progress Notes (Signed)
PROGRESS NOTE    Dana Horton  ZOX:[id number] DOB: 15-Oct-1945 DOA: 08/06/2022 PCP: Crist Fat, MD   Brief Narrative:  77 year old female with multiple medical problems including atrial fibrillation on Eliquis, hypertension, anemia, bladder tumor, anemia presents to the ED with blood in the urine and flank pain on right and left side also having neck pain. She is having hematuria on and off over the past month. Recently admitted to [facility] with upper/lower endoscopy without any findings - no need for transfusion at that time. Placed on ambulatory holter monitor for ongoing hypotension by cardiology. Outpatient cystoscopy in [place] (Dr Theodoro Grist) with concern for recurrent bladder tumor.   Assessment & Plan:   Principal Problem:   Symptomatic anemia Active Problems:   Essential hypertension   OSA on CPAP   PAF (paroxysmal atrial fibrillation) (HCC)   Chronic obstructive pulmonary disease (HCC)   Hypercholesterolemia   Bladder tumor  Symptomatic acute blood loss anemia complicated by iron deficiency anemia(questionably chronic)POA Hematuria History of bladder tumor: Patient complains of on and off hematuria since Easter and had cystoscopy recently in [place] showing recurrence of her bladder tumor.   CT renal study is unremarkable however unable to void in the ED.  Status post 2 unit PRBC since admission, repeat hemoglobin 7.5. Urology is consulted, holding Eliquis plan for TURB 5/16th afternoon N.p.o. at midnight.      History of ?GI bleeding resolved:  She has had extensive workup including CT angio, EGD colonoscopy in [facility] at [facility]. Reports she has brown and dark stool- and attributes to oral iron.   Essential hypertension: BP soft hold valsartan, hold lasix for now.   OSA on CPAP COPD: Continue supplemental oxygen as needed, nebulizer, Singulair.   PAF: Monitoring telemetry, hold Eliquis.  Resume her flecainide, metoprolol.    Hypercholesterolemia:  Continue statin.   DVT prophylaxis: Holding in setting of above Code Status: Full Family Communication: None present  Status is: Inpatient  Dispo: The patient is from: Home              Anticipated d/c is to: Same              Anticipated d/c date is:  48 to 72 hours              Patient currently not medically stable for discharge  Consultants:  Urology  Procedures:  Tentative TURB 08/09/2022  Antimicrobials:  Operative per urology  Subjective: No acute issues or events overnight denies nausea vomiting diarrhea constipation headache fevers chills or chest pain  Objective: Vitals:   08/08/22 0038 08/08/22 0310 08/08/22 0549 08/08/22 0745  BP: (!) 141/68 (!) 109/95 (!) 139/126   Pulse: 82 74 72   Resp: 20 16 16    Temp:  (!) 97.5 F (36.4 C) (!) 97.5 F (36.4 C)   TempSrc:  Oral Oral   SpO2: 94% 95% 95% 95%  Weight:      Height:        Intake/Output Summary (Last 24 hours) at 08/08/2022 0751 Last data filed at 08/08/2022 0305 Gross per 24 hour  Intake 344 ml  Output 400 ml  Net -56 ml   Filed Weights   08/06/22 2217  Weight: 67.6 kg    Examination:  General:  Pleasantly resting in bed, No acute distress. HEENT:  Normocephalic atraumatic.  Sclerae nonicteric, noninjected.  Extraocular movements intact bilaterally. Neck:  Without mass or deformity.  Trachea is midline. Lungs:  Clear to auscultate bilaterally without rhonchi, wheeze, or  rales. Heart:  Regular rate and rhythm.  Without murmurs, rubs, or gallops. Abdomen:  Soft, nontender, nondistended.  Without guarding or rebound. Extremities: Without cyanosis, clubbing, edema, or obvious deformity. Vascular:  Dorsalis pedis and posterior tibial pulses palpable bilaterally. Skin:  Warm and dry, no erythema  Data Reviewed: I have personally reviewed following labs and imaging studies  CBC: Recent Labs  Lab 08/06/22 1556 08/06/22 2237 08/07/22 0533 08/07/22 0925 08/07/22 1624  WBC 8.7 8.6 8.1  --   --    NEUTROABS 6.3  --   --   --   --   HGB 6.3* 6.4* 6.7* 7.2* 7.5*  HCT 20.8* 21.4* 22.3* 23.5* 24.7*  MCV 81 84.3 84.5  --   --   PLT 186 175 148*  --   --    Basic Metabolic Panel: Recent Labs  Lab 08/06/22 2237 08/07/22 0533  NA 141 139  K 4.2 3.7  CL 109 109  CO2 22 23  GLUCOSE 134* 107*  BUN 22 17  CREATININE 1.09* 0.80  CALCIUM 8.6* 7.6*   GFR: Estimated Creatinine Clearance: 55.9 mL/min (by C-G formula based on SCr of 0.8 mg/dL). Liver Function Tests: No results for input(s): "AST", "ALT", "ALKPHOS", "BILITOT", "PROT", "ALBUMIN" in the last 168 hours. No results for input(s): "LIPASE", "AMYLASE" in the last 168 hours. No results for input(s): "AMMONIA" in the last 168 hours. Coagulation Profile: Recent Labs  Lab 08/06/22 2237  INR 1.6*   Cardiac Enzymes: No results for input(s): "CKTOTAL", "CKMB", "CKMBINDEX", "TROPONINI" in the last 168 hours. BNP (last 3 results) No results for input(s): "PROBNP" in the last 8760 hours. HbA1C: No results for input(s): "HGBA1C" in the last 72 hours. CBG: No results for input(s): "GLUCAP" in the last 168 hours. Lipid Profile: No results for input(s): "CHOL", "HDL", "LDLCALC", "TRIG", "CHOLHDL", "LDLDIRECT" in the last 72 hours. Thyroid Function Tests: No results for input(s): "TSH", "T4TOTAL", "FREET4", "T3FREE", "THYROIDAB" in the last 72 hours. Anemia Panel: Recent Labs    08/06/22 1556  VITAMINB12 900  FERRITIN 119  TIBC 232*  IRON 17*  RETICCTPCT 2.6   Sepsis Labs: No results for input(s): "PROCALCITON", "LATICACIDVEN" in the last 168 hours.  No results found for this or any previous visit (from the past 240 hour(s)).       Radiology Studies: CT Renal Stone Study  Result Date: 08/06/2022 CLINICAL DATA:  Concern for bladder hemorrhage. Flank pain. History of bladder cancer. EXAM: CT ABDOMEN AND PELVIS WITHOUT CONTRAST TECHNIQUE: Multidetector CT imaging of the abdomen and pelvis was performed following the  standard protocol without IV contrast. RADIATION DOSE REDUCTION: This exam was performed according to the departmental dose-optimization program which includes automated exposure control, adjustment of the mA and/or kV according to patient size and/or use of iterative reconstruction technique. COMPARISON:  CTA abdomen and pelvis 06/22/2022. CT abdomen and pelvis 05/27/2015. 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 Hepatobiliary: 1 cm rounded hypodensity in the left lobe is unchanged, possibly a cyst or hemangioma. No new liver lesions are seen allowing for lack of intravenous contrast. Gallbladder and bile ducts are within normal limits. Pancreas: Unremarkable. No pancreatic ductal dilatation or surrounding inflammatory changes. Spleen: Normal in size without focal abnormality. Adrenals/Urinary Tract: 3.6 by 3.3 cm left adrenal nodule is unchanged. Right adrenal gland is within normal limits. There is no hydronephrosis or perinephric fat stranding. Bilateral renal cortical cysts are again noted. Mildly hyperdense areas in the left kidney measuring 2.8 cm  image 2/32 and 1.9 cm image 2/25 appear unchanged from the prior examination. No urinary tract calculi are seen. The bladder is under distended unremarkable. Stomach/Bowel: Stomach is within normal limits. Appendix appears normal. No evidence of bowel wall thickening, distention, or inflammatory changes. There is diffuse colonic diverticulosis. Vascular/Lymphatic: Aortic atherosclerosis. No enlarged abdominal or pelvic lymph nodes. Reproductive: Status post hysterectomy. No adnexal masses. Other: No abdominal wall hernia or abnormality. No abdominopelvic ascites. Musculoskeletal: No acute or significant osseous findings. IMPRESSION: 1. No acute process in the abdomen or pelvis. 2. Colonic diverticulosis without evidence for diverticulitis. 3. 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 4. Stable mildly  hyperdense lesions in the left kidney, findings may represent a proteinaceous cyst. Aortic Atherosclerosis (ICD10-I70.0). Electronically Signed   By: Darliss Cheney M.D.   On: 08/06/2022 23:54        Scheduled Meds:  sodium chloride   Intravenous Once   flecainide  50 mg Oral BID   fluticasone  2 spray Each Nare Daily   methocarbamol  500 mg Oral TID   metoprolol succinate  25 mg Oral Daily   mometasone-formoterol  2 puff Inhalation BID   montelukast  10 mg Oral QPM   pantoprazole  40 mg Oral Daily   rosuvastatin  5 mg Oral QODAY   umeclidinium bromide  1 puff Inhalation Daily   Continuous Infusions:   LOS: 1 day   Time spent:  Azucena Fallen, DO [facility]  If 7PM-7AM, please contact night-coverage [email or web address]  08/08/2022, 7:51 AM

## 2022-08-08 NOTE — Progress Notes (Signed)
Subjective: 1 Recurrent Bladder Cancer - s/p TURBT 2017 by MD that has retired. New gross hematuria prompted office cysto by [facility] in [place] 2024 which confimred multifocal recurrence. CT 07/2022 w/o overt metastatic disease.    2 - Bilateral Renal Cysts / Hyperdense Cysts - L>R scattered renal cysts, some hyperdense on CT 07/2022. No enhancment on CT 05/2022.      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 NO h/o refracotory HTN or hypokalemia.   5/15: Looks well on rounds today. No hematuria in collected sample.   Objective: Vital signs in last 24 hours: Temp:  [97.5 F (36.4 C)-98.9 F (37.2 C)] 97.5 F (36.4 C) (05/15 0549) Pulse Rate:  [71-82] 72 (05/15 0549) Resp:  [16-20] 16 (05/15 0549) BP: (109-141)/(48-126) 139/126 (05/15 0549) SpO2:  [90 %-95 %] 95 % (05/15 0745) FiO2 (%):  [21 %] 21 % (05/14 1921)  Intake/Output from previous day: 05/14 0701 - 05/15 0700 In: 344 [Blood:344] Out: 400 [Urine:400]  Intake/Output this shift: Total I/O In: 120 [P.O.:120] Out: -   Physical Exam:  General: Alert and oriented CV: No cyanosis Lungs: equal chest rise Abdomen: Soft, NTND, no rebound or guarding Skin: Gu: clear yellow urine  Lab Results: Recent Labs    08/07/22 0533 08/07/22 0925 08/07/22 1624  HGB 6.7* 7.2* 7.5*  HCT 22.3* 23.5* 24.7*   BMET Recent Labs    08/06/22 2237 08/07/22 0533  NA 141 139  K 4.2 3.7  CL 109 109  CO2 22 23  GLUCOSE 134* 107*  BUN 22 17  CREATININE 1.09* 0.80  CALCIUM 8.6* 7.6*     Studies/Results: CT Renal Stone Study  Result Date: 08/06/2022 CLINICAL DATA:  Concern for bladder hemorrhage. Flank pain. History of bladder cancer. EXAM: CT ABDOMEN AND PELVIS WITHOUT CONTRAST TECHNIQUE: Multidetector CT imaging of the abdomen and pelvis was performed following the standard protocol without IV contrast. RADIATION DOSE REDUCTION: This exam was performed  according to the departmental dose-optimization program which includes automated exposure control, adjustment of the mA and/or kV according to patient size and/or use of iterative reconstruction technique. COMPARISON:  CTA abdomen and pelvis 06/22/2022. CT abdomen and pelvis 05/27/2015. 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 Hepatobiliary: 1 cm rounded hypodensity in the left lobe is unchanged, possibly a cyst or hemangioma. No new liver lesions are seen allowing for lack of intravenous contrast. Gallbladder and bile ducts are within normal limits. Pancreas: Unremarkable. No pancreatic ductal dilatation or surrounding inflammatory changes. Spleen: Normal in size without focal abnormality. Adrenals/Urinary Tract: 3.6 by 3.3 cm left adrenal nodule is unchanged. Right adrenal gland is within normal limits. There is no hydronephrosis or perinephric fat stranding. Bilateral renal cortical cysts are again noted. Mildly hyperdense areas in the left kidney measuring 2.8 cm image 2/32 and 1.9 cm image 2/25 appear unchanged from the prior examination. No urinary tract calculi are seen. The bladder is under distended unremarkable. Stomach/Bowel: Stomach is within normal limits. Appendix appears normal. No evidence of bowel wall thickening, distention, or inflammatory changes. There is diffuse colonic diverticulosis. Vascular/Lymphatic: Aortic atherosclerosis. No enlarged abdominal or pelvic lymph nodes. Reproductive: Status post hysterectomy. No adnexal masses. Other: No abdominal wall hernia or abnormality. No abdominopelvic ascites. Musculoskeletal: No acute or significant osseous findings. IMPRESSION: 1. No acute process in the abdomen or pelvis. 2. Colonic diverticulosis without evidence for diverticulitis. 3. Stable left adrenal nodule.  This is previously characterized as adenoma. 4. Stable mildly hyperdense lesions in the left kidney, findings may represent a proteinaceous cyst. Aortic  Atherosclerosis (ICD10-I70.0). Electronically Signed   By: Darliss Cheney M.D.   On: 08/06/2022 23:54    Assessment/Plan: Hematuria resolved. Transfused 2u PRBC with returned Hgb of 7.5.  Repeat labs from this morning not available at this time. Continue holding Eliquis.  Patient states she has been in normal sinus rhythm for 4 to 5 years.  Suggested ongoing discussion with her cardiologist to review risk versus benefit. Plan for TURBT with Dr. Kathrynn Running on the afternoon of 5/16.  N.p.o. at midnight.   LOS: 1 day   Elmon Kirschner, NP [facility] Pager: [phone]  08/08/2022, 10:33 AM

## 2022-08-09 ENCOUNTER — Inpatient Hospital Stay (HOSPITAL_COMMUNITY): Payer: Medicare Other

## 2022-08-09 ENCOUNTER — Inpatient Hospital Stay (HOSPITAL_COMMUNITY): Payer: Medicare Other | Admitting: Registered Nurse

## 2022-08-09 ENCOUNTER — Encounter (HOSPITAL_COMMUNITY)
Admission: EM | Disposition: A | Discharge status: Home / Self Care | Payer: Self-pay | Source: Home / Self Care | Attending: Internal Medicine

## 2022-08-09 DIAGNOSIS — D649 Anemia, unspecified: Secondary | ICD-10-CM | POA: Diagnosis not present

## 2022-08-09 DIAGNOSIS — D630 Anemia in neoplastic disease: Secondary | ICD-10-CM

## 2022-08-09 DIAGNOSIS — I10 Essential (primary) hypertension: Secondary | ICD-10-CM

## 2022-08-09 DIAGNOSIS — F17210 Nicotine dependence, cigarettes, uncomplicated: Secondary | ICD-10-CM

## 2022-08-09 DIAGNOSIS — D494 Neoplasm of unspecified behavior of bladder: Secondary | ICD-10-CM

## 2022-08-09 HISTORY — PX: TRANSURETHRAL RESECTION OF BLADDER TUMOR: SHX2575

## 2022-08-09 LAB — BPAM RBC
Blood Product Expiration Date: 202406132359
ISSUE DATE / TIME: 202405150008
Unit Type and Rh: 5100

## 2022-08-09 LAB — TYPE AND SCREEN
Unit division: 0
Unit division: 0

## 2022-08-09 LAB — CBC
HCT: 27.2 % — ABNORMAL LOW (ref 36.0–46.0)
Hemoglobin: 8.6 g/dL — ABNORMAL LOW (ref 12.0–15.0)
MCH: 26.7 pg (ref 26.0–34.0)
MCHC: 31.6 g/dL (ref 30.0–36.0)
MCV: 84.5 fL (ref 80.0–100.0)
Platelets: 180 10*3/uL (ref 150–400)
RBC: 3.22 MIL/uL — ABNORMAL LOW (ref 3.87–5.11)
RDW: 16.8 % — ABNORMAL HIGH (ref 11.5–15.5)
WBC: 7.5 10*3/uL (ref 4.0–10.5)
nRBC: 0 % (ref 0.0–0.2)

## 2022-08-09 LAB — BASIC METABOLIC PANEL
Anion gap: 7 (ref 5–15)
BUN: 10 mg/dL (ref 8–23)
CO2: 24 mmol/L (ref 22–32)
Calcium: 8.3 mg/dL — ABNORMAL LOW (ref 8.9–10.3)
Chloride: 105 mmol/L (ref 98–111)
Creatinine, Ser: 0.72 mg/dL (ref 0.44–1.00)
GFR, Estimated: >60 mL/min (ref >60)
Glucose, Bld: 97 mg/dL (ref 70–99)
Potassium: 3.7 mmol/L (ref 3.5–5.1)
Sodium: 136 mmol/L (ref 135–145)

## 2022-08-09 SURGERY — TURBT (TRANSURETHRAL RESECTION OF BLADDER TUMOR)
Anesthesia: General

## 2022-08-09 MED ORDER — PROPOFOL 10 MG/ML IV BOLUS
INTRAVENOUS | Status: AC
  Filled 2022-08-09: qty 20

## 2022-08-09 MED ORDER — ONDANSETRON HCL 4 MG/2ML IJ SOLN
INTRAMUSCULAR | Status: AC
  Filled 2022-08-09: qty 2

## 2022-08-09 MED ORDER — LACTATED RINGERS IV SOLN: INTRAVENOUS | Status: DC | PRN

## 2022-08-09 MED ORDER — APIXABAN 5 MG PO TABS: 5.0000 mg | ORAL_TABLET | Freq: Two times a day (BID) | ORAL | 3 refills | Status: DC

## 2022-08-09 MED ORDER — FENTANYL CITRATE (PF) 100 MCG/2ML IJ SOLN
INTRAMUSCULAR | Status: DC | PRN
  Administered 2022-08-09: 50 ug via INTRAVENOUS

## 2022-08-09 MED ORDER — OXYCODONE HCL 5 MG PO TABS: 5.0000 mg | ORAL_TABLET | Freq: Once | ORAL | Status: DC | PRN

## 2022-08-09 MED ORDER — IOHEXOL 300 MG/ML  SOLN
INTRAMUSCULAR | Status: DC | PRN
  Administered 2022-08-09: 19 mL

## 2022-08-09 MED ORDER — SODIUM CHLORIDE 0.9 % IR SOLN
Status: DC | PRN
  Administered 2022-08-09 (×3): 3000 mL via INTRAVESICAL

## 2022-08-09 MED ORDER — FENTANYL CITRATE (PF) 100 MCG/2ML IJ SOLN
INTRAMUSCULAR | Status: AC
  Filled 2022-08-09: qty 2

## 2022-08-09 MED ORDER — PHENYLEPHRINE HCL-NACL 20-0.9 MG/250ML-% IV SOLN
INTRAVENOUS | Status: DC | PRN
  Administered 2022-08-09: 25 ug/min via INTRAVENOUS

## 2022-08-09 MED ORDER — ONDANSETRON HCL 4 MG/2ML IJ SOLN: 4.0000 mg | Freq: Four times a day (QID) | INTRAMUSCULAR | Status: DC | PRN

## 2022-08-09 MED ORDER — DEXAMETHASONE SODIUM PHOSPHATE 10 MG/ML IJ SOLN
INTRAMUSCULAR | Status: AC
  Filled 2022-08-09: qty 1

## 2022-08-09 MED ORDER — LIDOCAINE 2% (20 MG/ML) 5 ML SYRINGE
INTRAMUSCULAR | Status: DC | PRN
  Administered 2022-08-09: 60 mg via INTRAVENOUS

## 2022-08-09 MED ORDER — DEXAMETHASONE SODIUM PHOSPHATE 10 MG/ML IJ SOLN
INTRAMUSCULAR | Status: DC | PRN
  Administered 2022-08-09: 8 mg via INTRAVENOUS

## 2022-08-09 MED ORDER — OXYCODONE HCL 5 MG/5ML PO SOLN: 5.0000 mg | Freq: Once | ORAL | Status: DC | PRN

## 2022-08-09 MED ORDER — FENTANYL CITRATE PF 50 MCG/ML IJ SOSY: 25.0000 ug | PREFILLED_SYRINGE | INTRAMUSCULAR | Status: DC | PRN

## 2022-08-09 MED ORDER — CEFAZOLIN SODIUM-DEXTROSE 2-4 GM/100ML-% IV SOLN
2.0000 g | Freq: Once | INTRAVENOUS | Status: AC
  Administered 2022-08-09: 2 g via INTRAVENOUS
  Filled 2022-08-09: qty 100

## 2022-08-09 MED ORDER — LIDOCAINE HCL (PF) 2 % IJ SOLN
INTRAMUSCULAR | Status: AC
  Filled 2022-08-09: qty 5

## 2022-08-09 MED ORDER — PROPOFOL 10 MG/ML IV BOLUS
INTRAVENOUS | Status: DC | PRN
  Administered 2022-08-09: 100 mg via INTRAVENOUS

## 2022-08-09 MED ORDER — ONDANSETRON HCL 4 MG/2ML IJ SOLN
INTRAMUSCULAR | Status: DC | PRN
  Administered 2022-08-09: 4 mg via INTRAVENOUS

## 2022-08-09 SURGICAL SUPPLY — 17 items
BAG DRN RND TRDRP ANRFLXCHMBR (UROLOGICAL SUPPLIES)
BAG URINE DRAIN 2000ML AR STRL (UROLOGICAL SUPPLIES) IMPLANT
BAG URO CATCHER STRL LF (MISCELLANEOUS) ×2 IMPLANT
DRAPE FOOT SWITCH (DRAPES) ×2 IMPLANT
ELECT REM PT RETURN 15FT ADLT (MISCELLANEOUS) ×2 IMPLANT
EVACUATOR MICROVAS BLADDER (UROLOGICAL SUPPLIES) IMPLANT
GLOVE SURG LX STRL 7.5 STRW (GLOVE) ×2 IMPLANT
GOWN SRG XL LVL 4 BRTHBL STRL (GOWNS) ×2 IMPLANT
GOWN STRL NON-REIN XL LVL4 (GOWNS) ×1
KIT TURNOVER KIT A (KITS) IMPLANT
LOOP CUT BIPOLAR 24F LRG (ELECTROSURGICAL) IMPLANT
MANIFOLD NEPTUNE II (INSTRUMENTS) ×2 IMPLANT
PACK CYSTO (CUSTOM PROCEDURE TRAY) ×2 IMPLANT
SYR TOOMEY IRRIG 70ML (MISCELLANEOUS)
SYRINGE TOOMEY IRRIG 70ML (MISCELLANEOUS) IMPLANT
TUBING CONNECTING 10 (TUBING) ×2 IMPLANT
TUBING UROLOGY SET (TUBING) ×2 IMPLANT

## 2022-08-09 NOTE — Anesthesia Procedure Notes (Signed)
Procedure Name: LMA Insertion Date/Time: 08/09/2022 1:05 PM  Performed by: Elisabeth Cara, CRNAPre-anesthesia Checklist: Patient identified, Emergency Drugs available, Suction available, Patient being monitored and Timeout performed Patient Re-evaluated:Patient Re-evaluated prior to induction Preoxygenation: Pre-oxygenation with 100% oxygen Induction Type: IV induction Ventilation: Mask ventilation without difficulty LMA: LMA with gastric port inserted LMA Size: 4.0 Number of attempts: 1 Placement Confirmation: positive ETCO2 and breath sounds checked- equal and bilateral Tube secured with: Tape Dental Injury: Teeth and Oropharynx as per pre-operative assessment

## 2022-08-09 NOTE — Progress Notes (Signed)
Discharge instructions reviewed with pt and questions answered to her satisfaction.  IV's removed and pt taken to discharge area via wheelchair.

## 2022-08-09 NOTE — TOC Initial Note (Signed)
Transition of Care [facility]) - Initial/Assessment Note    Patient Details  Name: Dana Horton MRN: [id number] Date of Birth: 1945-04-26  [facility]) CM/SW Contact:    Durenda Guthrie, RN Phone Number: 08/09/2022, 9:25 AM  Clinical Narrative:                 Transition of Care [facility]) Department has reviewed patient and no TOC needs have been identified at this time. We will continue to monitor patient advancement through Interdisciplinary progressions and if new patient needs arise, please place a consult.  Expected Discharge Plan: Home/Self Care Barriers to Discharge: Continued Medical Work up   Patient Goals and CMS Choice            Expected Discharge Plan and Services                                              Prior Living Arrangements/Services                       Activities of Daily Living Home Assistive Devices/Equipment: Other (Comment) (zio patch) ADL Screening (condition at time of admission) Patient's cognitive ability adequate to safely complete daily activities?: Yes Is the patient deaf or have difficulty hearing?: No Does the patient have difficulty seeing, even when wearing glasses/contacts?: No Does the patient have difficulty concentrating, remembering, or making decisions?: No Patient able to express need for assistance with ADLs?: Yes Does the patient have difficulty dressing or bathing?: No Independently performs ADLs?: Yes (appropriate for developmental age) Does the patient have difficulty walking or climbing stairs?: No Weakness of Legs: None Weakness of Arms/Hands: None  Permission Sought/Granted                  Emotional Assessment              Admission diagnosis:  Symptomatic anemia [D64.9] Anemia, unspecified type [D64.9] Hematuria, unspecified type [R31.9] Patient Active Problem List   Diagnosis Date Noted   Symptomatic anemia 08/07/2022   Atrial fibrillation (HCC)  08/06/2022   Bladder tumor 08/06/2022   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 06/29/2022   Iron deficiency anemia due to chronic blood loss 06/29/2022   Sleep apnea 05/21/2022   Chronic obstructive pulmonary disease (HCC) 05/21/2022   Seasonal allergies 05/21/2022   Hypercholesterolemia 05/21/2022   Cigarette smoker 05/21/2022   Osteopenia 05/21/2022   Stage 3a chronic kidney disease (HCC) 05/21/2022   Primary osteoarthritis involving multiple joints 05/21/2022   History of bladder cancer 05/21/2022   BMI 28.0-28.9,adult 05/21/2022   Bronchitis 03/07/2022   Chronic anticoagulation 06/10/2019   Renal insufficiency 06/10/2019   Atrial fibrillation with rapid ventricular response (HCC) 04/10/2017   HCAP (healthcare-associated pneumonia) 04/10/2017   Elevated troponin    Essential hypertension 04/08/2017   COPD exacerbation (HCC) 04/08/2017   OSA on CPAP 04/08/2017   Chronic diastolic heart failure (HCC)    Non-ST elevation (NSTEMI) myocardial infarction (HCC) 04/07/2017   Demand ischemia 09/25/2016   PAF (paroxysmal atrial fibrillation) (HCC) 09/24/2016   Precordial pain 10/17/2015   PCP:  Crist Fat, MD Pharmacy:   [facility] - [address] - [place], [place] - [address] [place] [address] Phone: [phone] Fax: [phone]  [facility] [phone] - [place], [place] - [address] AT [address] [place] [address] Phone: [phone] Fax: [phone]  [facility] - [place], [place] - [address] [place] [address] Phone: [phone] Fax: [phone]  [facility] - [place], [place] - [address] [place] [address] Phone: [phone] Fax: [phone]     Social Determinants of Health (SDOH) Social History: SDOH Screenings   Food Insecurity: No Food  Insecurity (08/07/2022)  Housing: Low Risk  (08/07/2022)  Transportation Needs: No Transportation Needs (08/07/2022)  Utilities: Not At Risk (08/07/2022)  Tobacco Use: High Risk (08/06/2022)   SDOH Interventions:     Readmission Risk Interventions     No data to display

## 2022-08-09 NOTE — Discharge Instructions (Addendum)
1 - You may have urinary urgency (bladder spasms) and bloody urine on / off for up to 2 weeks. This is normal.  2 - OK to restart blood thinners on 5/18 as long as no clots in urine.   3 - Call MD or go to ER for fever >102, severe pain / nausea / vomiting not relieved by medications, or acute change in medical status

## 2022-08-09 NOTE — Op Note (Signed)
NAME: Dana Horton, Dana Horton MEDICAL RECORD NO: [id number] ACCOUNT NO: [id number] DATE OF BIRTH: [birth year] FACILITY: [facility] LOCATION: [facility] PHYSICIAN: Sebastian Ache, MD  Operative Report   DATE OF PROCEDURE: 08/09/2022  PREOPERATIVE DIAGNOSIS:  Recurrent bladder cancer with hematuria.  PROCEDURE: 1.  Transurethral resection of bladder tumor, volume medium. 2.  Bilateral retrograde pyelograms interpretation.  SPECIMENS:   1.  Bladder tumor. 2.  Base of bladder tumor.  FINDINGS:   1.  Approximately 4 cm2 total surface area of papillary bladder tumor, mostly lateral to the right ureteral orifice separate foci and right superior, separate foci down. 2.  Unremarkable bilateral retrograde pyelograms.  INDICATIONS:  The patient is a 77 year old lady who has recurrent bladder cancer per report. She has been followed in [place] for many years.  She was found on workup of gross hematuria to have recurrent bladder cancer per referring cystoscopy.  She  reports she is hemodynamically significant hematuria requiring transfusions and multiple hospitalizations at other facilities.  She was hospitalized here recently with similar picture of gross hematuria.  Hemodynamically significant related to her  recurrent bladder cancer.  Staging CT is clinically localized.  Options were discussed including recommended path of transfusion.  Hold blood thinners for at least 48 hours and proceed with transurethral resection bladder tumor.  She has met the  criteria.  Informed consent was obtained and placed in medical record.  PROCEDURE IN DETAIL:  The patient being identified and verified. Procedure being transurethral resection of bladder tumor, retrogrades confirmed.  Procedure timeout was performed.  Intravenous antibiotics were administered.  General LMA anesthesia was  induced.  The patient was placed into a low lithotomy position.  Sterile field was created, prepped and draped the patient's vagina,  introitus, and proximal thighs using iodine.  Cystourethroscopy was performed using 21-French rigid cystoscope with  offset lens.  Inspection of urinary bladder revealed multifocal papillary tumor, mostly lateral to the right ureteral orifice, separate foci right dome, separate foci midline down.  There was an old resection site that appeared to be lateral to the left  ureteral orifice without papillary tumor in this location.  Attention was directed at retrograde pyelograms.  The left ureteral orifice was cannulated with a 6-French end-hole catheter, and a left retrograde pyelogram was obtained.  Left retrograde pyelogram demonstrated single left ureter, single system left kidney.  No filling defects or narrowing noted.  Right retrograde pyelogram was obtained.  Right retrograde pyelogram demonstrated a single right ureter, single system right kidney.  No filling defects or narrowing noted.  The cystoscope was exchanged for a 26-French resectoscope sheath and using medium size loop, very careful resection was performed of each focus of tumor down to what appeared to be the superficial fibromuscular stroma of the urinary bladder.  Exquisite  care was taken to avoid injury near the orifices, which did not occur.  Bladder tumor fragments were irrigated set aside for permanent pathology.  The dominant area of tumor, which was lateral to the right ureteral orifice.  This area was sampled at the  level of the base using cold cup biopsy forceps what appeared to be a seromuscular area set aside, labeled as base of bladder tumor.  Next, the loop was once again used to provide coagulation current fulguration to each focus of tumor with additional rim  approximately 1-2 cm in each direction beyond the area of fibrous tumor. We achieved the goals of the resection today.  There was no obvious perforation.  It was not felt that  catheterization would be warranted.  Ureteral orifices remained visibly  patent and  uninjured.  Bladder was emptied per cystoscope.  Procedure was then terminated.  The patient tolerated the procedure well, no immediate periprocedural complications.  The patient was taken to postanesthesia care unit in stable condition.  Plan  for continued inpatient admission, likely discharge home soon.   PUS D: 08/09/2022 1:43:03 pm T: 08/09/2022 2:23:00 pm  JOB: [id number]/ [id number]

## 2022-08-09 NOTE — Anesthesia Preprocedure Evaluation (Signed)
Anesthesia Evaluation  Patient identified by MRN, date of birth, ID band Patient awake    Reviewed: Allergy & Precautions, H&P , NPO status , Patient's Chart, lab work & pertinent test results  Airway Mallampati: II   Neck ROM: full    Dental   Pulmonary sleep apnea , COPD, Current Smoker   breath sounds clear to auscultation       Cardiovascular hypertension, + dysrhythmias Atrial Fibrillation  Rhythm:irregular Rate:Normal     Neuro/Psych    GI/Hepatic   Endo/Other    Renal/GU      Musculoskeletal  (+) Arthritis ,    Abdominal   Peds  Hematology  (+) Blood dyscrasia, anemia Hemoglobin 8.6   Anesthesia Other Findings   Reproductive/Obstetrics                             Anesthesia Physical Anesthesia Plan  ASA: 3  Anesthesia Plan: General   Post-op Pain Management:    Induction: Intravenous  PONV Risk Score and Plan: 2 and Ondansetron, Dexamethasone and Treatment may vary due to age or medical condition  Airway Management Planned: LMA  Additional Equipment:   Intra-op Plan:   Post-operative Plan: Extubation in OR  Informed Consent: I have reviewed the patients History and Physical, chart, labs and discussed the procedure including the risks, benefits and alternatives for the proposed anesthesia with the patient or authorized representative who has indicated his/her understanding and acceptance.     Dental advisory given  Plan Discussed with: CRNA, Anesthesiologist and Surgeon  Anesthesia Plan Comments:        Anesthesia Quick Evaluation

## 2022-08-09 NOTE — Progress Notes (Signed)
Day of Surgery   Subjective/Chief Complaint:  1 Recurrent Bladder Cancer - s/p TURBT 2017 by MD that has retired. New gross hematuria prompted office cysto by [facility] in [place] 2024 which confimred multifocal recurrence. CT 07/2022 w/o overt metastatic disease.    2 - Bilateral Renal Cysts / Hyperdense Cysts - L>R scattered renal cysts, some hyperdense on CT 07/2022. No enhancment on CT 05/2022.      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 NO h/o refracotory HTN or hypokalemia.    PMH sig for AFib/Eliquus (no h/o CVA, has come off for procedures), Benign Hyst, Cystocele repair, COPD with PRN O2 (still smokes some). LIves independatnly with husband who has very severe COPD, daughter Britta Mccreedy (works [employer] peri-op) lives close.    Today "Dana Horton" is seen ready for cysto and bladder tumor resection. Hgb now 8s, no fevers.   Objective: Vital signs in last 24 hours: Temp:  [98 F (36.7 C)-98.4 F (36.9 C)] 98.4 F (36.9 C) (05/16 0532) Pulse Rate:  [74-77] 74 (05/16 0532) Resp:  [16-20] 16 (05/16 0532) BP: (133-159)/(56-66) 133/66 (05/16 0532) SpO2:  [87 %-95 %] 87 % (05/16 0910) FiO2 (%):  [21 %] 21 % (05/16 0000) Last BM Date : 08/06/22  Intake/Output from previous day: 05/15 0701 - 05/16 0700 In: 240 [P.O.:240] Out: -  Intake/Output this shift: No intake/output data recorded.  EXAM: NAD, Pleasant, daughter at bedside Non-labored breathing on RA SNTND No c/c/e  Lab Results:  Recent Labs    08/07/22 0533 08/07/22 0925 08/07/22 1624 08/09/22 0523  WBC 8.1  --   --  7.5  HGB 6.7*   < > 7.5* 8.6*  HCT 22.3*   < > 24.7* 27.2*  PLT 148*  --   --  180   < > = values in this interval not displayed.   BMET Recent Labs    08/07/22 0533 08/09/22 0523  NA 139 136  K 3.7 3.7  CL 109 105  CO2 23 24  GLUCOSE 107* 97  BUN 17 10  CREATININE 0.80 0.72  CALCIUM 7.6* 8.3*   PT/INR Recent Labs     08/06/22 2237  LABPROT 19.1*  INR 1.6*   ABG No results for input(s): "PHART", "HCO3" in the last 72 hours.  Invalid input(s): "PCO2", "PO2"  Studies/Results: No results found.  Anti-infectives: Anti-infectives (From admission, onward)    Start     Dose/Rate Route Frequency Ordered Stop   08/07/22 0015  cefTRIAXone (ROCEPHIN) 1 g in sodium chloride 0.9 % 100 mL IVPB        1 g 200 mL/hr over 30 Minutes Intravenous  Once 08/07/22 0002 08/07/22 0056       Assessment/Plan:  Proceed as planned with cysto, bilateral retrogrades, TURBT for likely recurrent bladder cancer. Goals are resect visible tumor, help with hematuira, and obtain tissue for restaging. Risks, benefits, alternatives, expected peri-op course discussed.   Loletta Parish. 08/09/2022

## 2022-08-09 NOTE — Discharge Summary (Signed)
Physician Discharge Summary  Dana Horton WUJ:[id number] DOB: 19-Nov-1945 DOA: 08/06/2022  PCP: Crist Fat, MD  Admit date: 08/06/2022 Discharge date: 08/09/2022  Admitted From: Home Disposition:  Home  Recommendations for Outpatient Follow-up:  Follow up with PCP in 1-2 weeks Follow-up with urology as scheduled  Home Health: None Equipment/Devices: None  Discharge Condition: Stable CODE STATUS: Full Diet recommendation: Low-salt low fat diet  Brief/Interim Summary: 77 year old female with multiple medical problems including atrial fibrillation on Eliquis, hypertension, anemia, bladder tumor, anemia presents to the ED with blood in the urine and flank pain on right and left side also having neck pain. She is having hematuria on and off over the past month. Recently admitted to [facility] with upper/lower endoscopy without any findings - no need for transfusion at that time. Placed on ambulatory holter monitor for ongoing hypotension by cardiology. Outpatient cystoscopy in [place] (Dr Theodoro Grist) with concern for recurrent bladder tumor.    Patient admitted as above with acute symptomatic anemia, likely blood loss complicated by iron deficiency in the setting of recurrent bladder tumor.  Patient had intervention today on the 16th with urology.  Tolerated procedure well, resection without complication.  Given resolution of site of bleeding and patient's labs remaining within normal limits patient is otherwise stable and agreeable for discharge home with outpatient follow-up.  Hold any anticoagulation or antiplatelets through 5/19, okay to resume on 5/20.  Medication changes otherwise as below  Discharge Diagnoses:  Principal Problem:   Symptomatic anemia Active Problems:   Essential hypertension   OSA on CPAP   PAF (paroxysmal atrial fibrillation) (HCC)   Chronic obstructive pulmonary disease (HCC)   Hypercholesterolemia   Bladder tumor  Symptomatic acute blood loss anemia  complicated by iron deficiency anemia(questionably chronic)POA Hematuria Recurrence of bladder tumor: Hold Eliquis, okay to resume 08/13/2022.      History of ?GI bleeding resolved:  She has had extensive workup including CT angio, EGD colonoscopy in April at [facility]. Reports she has brown and dark stool- and attributes to oral iron.   Essential hypertension: Blood pressure somewhat low, discontinue furosemide   OSA on CPAP COPD: Continue supplemental oxygen as needed, nebulizer, Singulair.   PAF: Hold Eliquis as above.  Continue flecainide, metoprolol.    Hypercholesterolemia: Continue statin.  Discharge Instructions  Discharge Instructions     Ambulatory Referral for Lung Cancer Scre   Complete by: As directed    Call MD for:   Complete by: As directed    Worsening bleeding   Call MD for:  persistant nausea and vomiting   Complete by: As directed    Call MD for:  severe uncontrolled pain   Complete by: As directed    Call MD for:  temperature >100.4   Complete by: As directed    Diet - low sodium heart healthy   Complete by: As directed    Discharge instructions   Complete by: As directed    Hold eliquis until Sunday the 19th - you can resume in the morning. Call urology if worsening bleeding/pain.   Increase activity slowly   Complete by: As directed       Allergies as of 08/09/2022       Reactions   Albuterol Palpitations   Lisinopril Cough        Medication List     STOP taking these medications    furosemide 20 MG tablet Commonly known as: LASIX       TAKE these medications    acetaminophen  500 MG tablet Commonly known as: TYLENOL Take 500 mg by mouth every 6 (six) hours as needed for moderate pain or headache.   apixaban 5 MG Tabs tablet Commonly known as: ELIQUIS Take 1 tablet (5 mg total) by mouth 2 (two) times daily. Start taking on: Aug 12, 2022 What changed: These instructions start on Aug 12, 2022. If you are unsure what to do until  then, ask your doctor or other care provider.   cetirizine 10 MG tablet Commonly known as: ZYRTEC Take 10 mg by mouth at bedtime.   Ferric Maltol 30 MG Caps Take 1 capsule (30 mg total) by mouth daily.   flecainide 50 MG tablet Commonly known as: TAMBOCOR Take 1 tablet (50 mg total) by mouth 2 (two) times daily.   fluticasone 50 MCG/ACT nasal spray Commonly known as: FLONASE Place 2 sprays into both nostrils daily.   LAXATIVE PO Take 1 tablet by mouth daily as needed (constipation).   levalbuterol 45 MCG/ACT inhaler Commonly known as: XOPENEX HFA Inhale 1-2 puffs into the lungs every 4 (four) hours as needed for wheezing.   metoprolol succinate 25 MG 24 hr tablet Commonly known as: TOPROL-XL Take 1 tablet (25 mg total) by mouth in the morning and at bedtime.   montelukast 10 MG tablet Commonly known as: SINGULAIR Take 1 tablet (10 mg total) by mouth every evening.   naproxen 500 MG tablet Commonly known as: NAPROSYN Take 500 mg by mouth 2 (two) times daily.   pantoprazole 40 MG tablet Commonly known as: PROTONIX Take 40 mg by mouth daily.   rosuvastatin 5 MG tablet Commonly known as: CRESTOR Take 5 mg by mouth every other day.   Symbicort 160-4.5 MCG/ACT inhaler Generic drug: budesonide-formoterol Inhale 2 puffs into the lungs 2 (two) times daily.   valsartan 160 MG tablet Commonly known as: Diovan Take 1 tablet (160 mg total) by mouth daily.        Follow-up Information     Manny, Delbert Phenix., MD Follow up.   Specialty: Urology Why: Please call to arrange post-op visit with Dr. Berneice Heinrich. Contact information: [address] [place] [address] [phone]                Allergies  Allergen Reactions   Albuterol Palpitations   Lisinopril Cough    Consultations: Urology  Procedures/Studies: DG C-Arm 1-60 Min-No Report  Result Date: 08/09/2022 Fluoroscopy was utilized by the requesting physician.  No radiographic interpretation.    CT Renal Stone Study  Result Date: 08/06/2022 CLINICAL DATA:  Concern for bladder hemorrhage. Flank pain. History of bladder cancer. EXAM: CT ABDOMEN AND PELVIS WITHOUT CONTRAST TECHNIQUE: Multidetector CT imaging of the abdomen and pelvis was performed following the standard protocol without IV contrast. RADIATION DOSE REDUCTION: This exam was performed according to the departmental dose-optimization program which includes automated exposure control, adjustment of the mA and/or kV according to patient size and/or use of iterative reconstruction technique. COMPARISON:  CTA abdomen and pelvis 06/22/2022. CT abdomen and pelvis 05/27/2015. 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 Hepatobiliary: 1 cm rounded hypodensity in the left lobe is unchanged, possibly a cyst or hemangioma. No new liver lesions are seen allowing for lack of intravenous contrast. Gallbladder and bile ducts are within normal limits. Pancreas: Unremarkable. No pancreatic ductal dilatation or surrounding inflammatory changes. Spleen: Normal in size without focal abnormality. Adrenals/Urinary Tract: 3.6 by 3.3 cm left adrenal nodule is unchanged. Right adrenal gland is within  normal limits. There is no hydronephrosis or perinephric fat stranding. Bilateral renal cortical cysts are again noted. Mildly hyperdense areas in the left kidney measuring 2.8 cm image 2/32 and 1.9 cm image 2/25 appear unchanged from the prior examination. No urinary tract calculi are seen. The bladder is under distended unremarkable. Stomach/Bowel: Stomach is within normal limits. Appendix appears normal. No evidence of bowel wall thickening, distention, or inflammatory changes. There is diffuse colonic diverticulosis. Vascular/Lymphatic: Aortic atherosclerosis. No enlarged abdominal or pelvic lymph nodes. Reproductive: Status post hysterectomy. No adnexal masses. Other: No abdominal wall hernia or abnormality. No abdominopelvic ascites.  Musculoskeletal: No acute or significant osseous findings. IMPRESSION: 1. No acute process in the abdomen or pelvis. 2. Colonic diverticulosis without evidence for diverticulitis. 3. 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 4. Stable mildly hyperdense lesions in the left kidney, findings may represent a proteinaceous cyst. Aortic Atherosclerosis (ICD10-I70.0). Electronically Signed   By: Darliss Cheney M.D.   On: 08/06/2022 23:54     Subjective: No acute issues or events overnight, tolerated procedure well otherwise stable for discharge as above   Discharge Exam: Vitals:   08/09/22 1345 08/09/22 1400  BP: (!) 151/66 (!) 155/68  Pulse: 68 71  Resp: 15 20  Temp: 98.8 F (37.1 C)   SpO2: 100% 96%   Vitals:   08/09/22 0532 08/09/22 0910 08/09/22 1345 08/09/22 1400  BP: 133/66  (!) 151/66 (!) 155/68  Pulse: 74  68 71  Resp: 16  15 20   Temp: 98.4 F (36.9 C)  98.8 F (37.1 C)   TempSrc: Oral     SpO2: (!) 88% (!) 87% 100% 96%  Weight:      Height:        General: Pt is alert, awake, not in acute distress Cardiovascular: RRR, S1/S2 +, no rubs, no gallops Respiratory: CTA bilaterally, no wheezing, no rhonchi Abdominal: Soft, NT, ND, bowel sounds + Extremities: no edema, no cyanosis  The results of significant diagnostics from this hospitalization (including imaging, microbiology, ancillary and laboratory) are listed below for reference.     Microbiology: No results found for this or any previous visit (from the past 240 hour(s)).   Labs: BNP (last 3 results) No results for input(s): "BNP" in the last 8760 hours. Basic Metabolic Panel: Recent Labs  Lab 08/06/22 2237 08/07/22 0533 08/09/22 0523  NA 141 139 136  K 4.2 3.7 3.7  CL 109 109 105  CO2 22 23 24   GLUCOSE 134* 107* 97  BUN 22 17 10   CREATININE 1.09* 0.80 0.72  CALCIUM 8.6* 7.6* 8.3*   Liver Function Tests: No results for input(s): "AST", "ALT", "ALKPHOS", "BILITOT", "PROT",  "ALBUMIN" in the last 168 hours. No results for input(s): "LIPASE", "AMYLASE" in the last 168 hours. No results for input(s): "AMMONIA" in the last 168 hours. CBC: Recent Labs  Lab 08/06/22 1556 08/06/22 2237 08/07/22 0533 08/07/22 0925 08/07/22 1624 08/09/22 0523  WBC 8.7 8.6 8.1  --   --  7.5  NEUTROABS 6.3  --   --   --   --   --   HGB 6.3* 6.4* 6.7* 7.2* 7.5* 8.6*  HCT 20.8* 21.4* 22.3* 23.5* 24.7* 27.2*  MCV 81 84.3 84.5  --   --  84.5  PLT 186 175 148*  --   --  180   Anemia work up Recent Labs    08/06/22 1556  VITAMINB12 900  FERRITIN 119  TIBC 232*  IRON 17*  RETICCTPCT 2.6   Urinalysis    Component Value Date/Time   COLORURINE YELLOW 08/06/2022 2219   APPEARANCEUR HAZY (A) 08/06/2022 2219   LABSPEC 1.023 08/06/2022 2219   PHURINE 5.0 08/06/2022 2219   GLUCOSEU NEGATIVE 08/06/2022 2219   HGBUR LARGE (A) 08/06/2022 2219   BILIRUBINUR NEGATIVE 08/06/2022 2219   KETONESUR NEGATIVE 08/06/2022 2219   PROTEINUR 30 (A) 08/06/2022 2219   NITRITE NEGATIVE 08/06/2022 2219   LEUKOCYTESUR TRACE (A) 08/06/2022 2219   Sepsis Labs Recent Labs  Lab 08/06/22 1556 08/06/22 2237 08/07/22 0533 08/09/22 0523  WBC 8.7 8.6 8.1 7.5   Microbiology No results found for this or any previous visit (from the past 240 hour(s)).   Time coordinating discharge: Over 30 minutes  SIGNED:   Azucena Fallen, DO [facility] 08/09/2022, 2:04 PM Pager   If 7PM-7AM, please contact night-coverage [email or web address]

## 2022-08-09 NOTE — Plan of Care (Signed)
Stopped in to check on patient this morning.  She was resting in bed with no specific complaints.  Hematuria has resolved for the time being.  Hemoglobin has improved to 8.6 and renal indices look good.  She is tentatively scheduled for her TURBT at 1230 today.

## 2022-08-09 NOTE — Progress Notes (Signed)
   08/09/22 0000  BiPAP/CPAP/SIPAP  BiPAP/CPAP/SIPAP Pt Type Adult  BiPAP/CPAP/SIPAP Resmed  Mask Type Full face mask  EPAP 10 cmH2O  FiO2 (%) 21 %  Patient Home Equipment Yes  Auto Titrate No

## 2022-08-09 NOTE — Transfer of Care (Signed)
Immediate Anesthesia Transfer of Care Note  Patient: Dana Horton  Procedure(s) Performed: TRANSURETHRAL RESECTION OF BLADDER TUMOR (TURBT) BILATERAL RETROGRADES  Patient Location: PACU  Anesthesia Type:General  Level of Consciousness: awake, alert , oriented, and patient cooperative  Airway & Oxygen Therapy: Patient Spontanous Breathing and Patient connected to face mask oxygen  Post-op Assessment: Report given to RN, Post -op Vital signs reviewed and stable, and Patient moving all extremities  Post vital signs: Reviewed and stable  Last Vitals:  Vitals Value Taken Time  BP 151/66 08/09/22 1345  Temp    Pulse 69 08/09/22 1347  Resp 17 08/09/22 1347  SpO2 99 % 08/09/22 1347  Vitals shown include unvalidated device data.  Last Pain:  Vitals:   08/09/22 0813  TempSrc:   PainSc: 3       Patients Stated Pain Goal: 3 (08/07/22 2211)  Complications: No notable events documented.

## 2022-08-09 NOTE — Progress Notes (Signed)
Initial Nutrition Assessment  INTERVENTION:   Once diet advanced: -Ensure Plus High Protein po BID, each supplement provides 350 kcal and 20 grams of protein.   NUTRITION DIAGNOSIS:   Increased nutrient needs related to chronic illness as evidenced by estimated needs.  GOAL:   Patient will meet greater than or equal to 90% of their needs  MONITOR:   PO intake, Supplement acceptance, Weight trends, I & O's, Labs  REASON FOR ASSESSMENT:   Malnutrition Screening Tool    ASSESSMENT:   77 year old female with multiple medical problems including atrial fibrillation on Eliquis, hypertension, anemia, bladder tumor, anemia presents to the ED with blood in the urine and flank pain on right and left side also having neck pain. Admitted for symptomatic anemia and recurrent bladder tumor.  Patient unavailable at this time. NPO for planned TURBT today.  When on diet yesterday, consumed ~50-95% of meals.  Per family reports, pt has had poor appetite and weight loss. Will order Ensure supplements following procedure.  Per weight records, pt has lost 5 lbs since September 2023, insignificant for time frame.   Medications: Ferrous sulfate  Labs reviewed.  NUTRITION - FOCUSED PHYSICAL EXAM:  Unable to complete at this time.  Diet Order:   Diet Order             Diet NPO time specified  Diet effective midnight                   EDUCATION NEEDS:   Not appropriate for education at this time  Skin:  Skin Assessment: Reviewed RN Assessment  Last BM:  5/13  Height:   Ht Readings from Last 1 Encounters:  08/06/22 5' 3.5" (1.613 m)    Weight:   Wt Readings from Last 1 Encounters:  08/06/22 67.6 kg    BMI:  Body mass index is 25.98 kg/m.  Estimated Nutritional Needs:   Kcal:  1700-1900  Protein:  80-90g  Fluid:  1.9L/day   Tilda Franco, MS, RD, LDN Inpatient Clinical Dietitian Contact information available via Amion

## 2022-08-09 NOTE — Brief Op Note (Signed)
08/06/2022 - 08/09/2022  1:37 PM  PATIENT:  Dana Horton  77 y.o. female  PRE-OPERATIVE DIAGNOSIS:  bladder tumor  POST-OPERATIVE DIAGNOSIS:  bladder tumor  PROCEDURE:  Procedure(s): TRANSURETHRAL RESECTION OF BLADDER TUMOR (TURBT) BILATERAL RETROGRADES (N/A)  SURGEON:  Surgeon(s) and Role:    * Kaeo Jacome, Delbert Phenix., MD - Primary  PHYSICIAN ASSISTANT:   ASSISTANTS: none   ANESTHESIA:   general  EBL:  minimal   BLOOD ADMINISTERED:none  DRAINS: none   LOCAL MEDICATIONS USED:  NONE  SPECIMEN:  Source of Specimen:  1 -bladder tumor; 2 - base of bladder tumor  DISPOSITION OF SPECIMEN:  PATHOLOGY  COUNTS:  YES  TOURNIQUET:  * No tourniquets in log *  DICTATION: .Other Dictation: Dictation Number [id number]  PLAN OF CARE: Admit to inpatient   PATIENT DISPOSITION:  PACU - hemodynamically stable.   Delay start of Pharmacological VTE agent (>24hrs) due to surgical blood loss or risk of bleeding: yes

## 2022-08-10 ENCOUNTER — Encounter (HOSPITAL_COMMUNITY): Payer: Self-pay | Admitting: Urology

## 2022-08-10 LAB — SURGICAL PATHOLOGY

## 2022-08-10 NOTE — Anesthesia Postprocedure Evaluation (Signed)
Anesthesia Post Note  Patient: Tiffin Goodrick Boomhower  Procedure(s) Performed: TRANSURETHRAL RESECTION OF BLADDER TUMOR (TURBT) BILATERAL RETROGRADES     Patient location during evaluation: PACU Anesthesia Type: General Level of consciousness: awake and alert Pain management: pain level controlled Vital Signs Assessment: post-procedure vital signs reviewed and stable Respiratory status: spontaneous breathing, nonlabored ventilation, respiratory function stable and patient connected to nasal cannula oxygen Cardiovascular status: blood pressure returned to baseline and stable Postop Assessment: no apparent nausea or vomiting Anesthetic complications: no   No notable events documented.  Last Vitals:  Vitals:   08/09/22 1430 08/09/22 1453  BP: (!) 155/70 (!) 154/64  Pulse: 67 72  Resp: 15 20  Temp: 36.7 C 36.8 C  SpO2: 94% (!) 86%    Last Pain:  Vitals:   08/09/22 1453  TempSrc: Oral  PainSc:                  Rocky Gladden S

## 2022-08-12 ENCOUNTER — Other Ambulatory Visit: Payer: Self-pay | Admitting: Internal Medicine

## 2022-08-12 MED ORDER — PREGABALIN 50 MG PO CAPS: 50.0000 mg | ORAL_CAPSULE | Freq: Two times a day (BID) | ORAL | 0 refills | Status: DC

## 2022-08-12 NOTE — Progress Notes (Signed)
The patient call about pain in her neck.  She was getting lyrica while she was in the hospital which helped but she did not get a script for it at discharge.  I will give her enough lyrica until she can some see me this week.

## 2022-08-15 ENCOUNTER — Ambulatory Visit: Payer: Medicare Other | Admitting: Internal Medicine

## 2022-08-16 ENCOUNTER — Ambulatory Visit: Payer: Medicare Other | Admitting: Internal Medicine

## 2022-08-16 ENCOUNTER — Encounter: Payer: Self-pay | Admitting: Internal Medicine

## 2022-08-16 VITALS — BP 126/64 | HR 73 | Temp 98.2°F | Resp 20 | Ht 63.0 in | Wt 143.8 lb

## 2022-08-16 DIAGNOSIS — J40 Bronchitis, not specified as acute or chronic: Secondary | ICD-10-CM | POA: Diagnosis not present

## 2022-08-16 DIAGNOSIS — D494 Neoplasm of unspecified behavior of bladder: Secondary | ICD-10-CM

## 2022-08-16 DIAGNOSIS — T148XXA Other injury of unspecified body region, initial encounter: Secondary | ICD-10-CM | POA: Insufficient documentation

## 2022-08-16 DIAGNOSIS — M542 Cervicalgia: Secondary | ICD-10-CM | POA: Insufficient documentation

## 2022-08-16 MED ORDER — DOXYCYCLINE MONOHYDRATE 100 MG PO CAPS: 100.0000 mg | ORAL_CAPSULE | Freq: Two times a day (BID) | ORAL | 0 refills | Status: DC

## 2022-08-16 MED ORDER — METHYLPREDNISOLONE 4 MG PO TBPK: ORAL_TABLET | ORAL | 0 refills | Status: DC

## 2022-08-16 MED ORDER — TIZANIDINE HCL 2 MG PO CAPS: 2.0000 mg | ORAL_CAPSULE | Freq: Three times a day (TID) | ORAL | 0 refills | Status: DC | PRN

## 2022-08-16 MED ORDER — PREGABALIN 50 MG PO CAPS: 50.0000 mg | ORAL_CAPSULE | Freq: Two times a day (BID) | ORAL | 0 refills | Status: DC

## 2022-08-16 NOTE — Assessment & Plan Note (Signed)
Plan as above.  

## 2022-08-16 NOTE — Assessment & Plan Note (Signed)
She is having cough of yellow sputum and states she is wheezing. I  will give her doxycycline.

## 2022-08-16 NOTE — Progress Notes (Signed)
Office Visit  Subjective   Patient ID: Dana Horton   DOB: 1945-12-29   Age: 77 y.o.   MRN: [id number]   Chief Complaint Chief Complaint  Patient presents with   Neck Pain    Nerve pain and knot in back      History of Present Illness The patient is a 77 yo female who comes in today with acute worsening of neck pain.  She was recently admitted to [facility] from 08/06/2022 until 08/09/2022 for symptomatic anemia.   I had seen her earlier that day where they had told me that the month before she was diagnosed with a new bladder tumor.  I checked a HgB later that day but unforunately she developed new acute hematuria which prompted her to go to the ER.  She does have a PMHx of atrial fibrillation on Eliquis.  She presented to the ER with blood in the urine and flank pain on right and left side also having neck pain. She was having hematuria on and off over the past month. Recently admitted to [facility] with upper/lower endoscopy without any findings - no need for transfusion at that time. Placed on ambulatory holter monitor for ongoing hypotension by cardiology. Outpatient cystoscopy in [place] (Dr Theodoro Grist) with concern for recurrent bladder tumor.  The patient was admitted as above with acute symptomatic anemia, likely blood loss complicated by iron deficiency in the setting of recurrent bladder tumor.  Patient had a TURBT onn 08/09/2022 with urology which she tolerated well. Her discharge hemoglobin was 8.6  They wanted her to resume her eliquis on 5/20.    The patient states she has had chronic problems with neck pain for years and this worsened when she was hospitalized.  She states that they placed her on lyrica which did help.  She states she has bilateral neck pain and pain in her left buttock.  She states that the pain in her neck is worse than her buttock.  This pain in her neck is sharp when she moves her head.  Otherwise it throbs most of the time.  Lyrica helps the pain.  She has  voltaren gel in the past and alleve which does not really help.  She also complains of dull aching pain that is continuous.  She was given morphine and ultram for both pains in the hospital but this did not help.       Dana Horton is a 77 yo female who comes in today to discuss her bladder tumor.  The patient was diagnosed with bladder cancer in 2017 where they noted a mass on her bladder on CT scan. She underwent resection with TURBT and did not require chemo, radiation or BCG infusions. Urology has been following her for survelliance where she last saw urology in 12/2021 and they did a cystoscopy which was normal.  However, she tells me she went back for a surveillance cystoscopy in 06/2022 and they found another bladder tumor (I do not have his notes).  Dr. Saddie Benders plans to do an elective operative procedure for this tumor.   I did see her last month on 4/5 for a hospital followup where she was admitted to [facility] from 06/23/2022 until 06/26/2022 due to GIB.  She presented to [facility] with melena that changed to BRBPR where she has a history significant for A. Fib on anticoagulation with eliquis.  RH did a CTA of her abd/pelvis which did not show any active bleed She was transferred to  [facility] on the above date where her HgB on transfer was 8.7.  Her hemoglobin remained stable during her hospitalization with a discharge hemoglobin of 9.2 on 06/26/2022.  She did not need a blood transfusion during her hospital stay.  They did iron studies and this showed an iron of 24 and ferritin level of 44.  GI was consulted and they performed an EGD and colonoscopy on 06/26/2022.  Her EGD showed that the esophagus and stomach appeared normal.  There was one sessile polyp measuring smaller than 5 mm in the duodenal bulb which was biopsied.  There was some mild erythematous mucosa (duodenitis) in the duodenal bulb and scalloped mucosa in the 1st part of the duodenum which was biopsied.  The 2nd part of the duodenum  appeared normal.  They were continue to check H. Pylori and continued her pantoproazole and resume her eliquis.  Her colonoscopy showed no evidence of blood, no stigmata of recent bleeding, with a healthy appearing however there was extensive diverticulosis of severe severity and causing severe luminal narrowing with 3 prolapsing (grade 2) hemorrhoids and 11 subcentimeter polyps were removed.  I have reviewed her EPIC chart and I do not see her pathology reports back.  They recommend repeating a colonoscopy in 1 year.  They resumed her diet and she was discharged home.  She was sent home on pantoproazole and they stopped her farxiga for her Stage III CKD due to "vaginal burning".  Today, she denies any more bleeding abdominal pain, dizziness, chest pain, palpitations, generalized weakness or SOB.  She states she stopped taking all her meds the night she went to the hospital.  She restarted her meds and when she took hydralazine 50mg  BID her SBP dropped to 97.  She states she got some weakness and SOB.  I asked her on 4/5 to stop the hydralazine and continue on metoprolol sucinate and valsartan.  She is checking her BP like 10 times per day and I asked her to stop this and check it once about 3 hours after taking her BP meds.  She can take her BP if she has symptoms.  Over the interim, she did see Dr. Dulce Sellar on 07/26/2022 for preoperative evaluation for her bladder tumor as described above.  She was having intermittent hypotension and weakness where he felt that we should hold on elective urologic surgery followed in this note and direct back to her PCP she may require hematology consultation and perhaps IV iron.  He adjustted her flecainide level for her A. Fib and decreased her valsartan to 160mg  daily.  They have placed an event monitor to monitor for bradycardia and this will be completed tomorrow per the patient.  Dr. Dulce Sellar wanted her to be seen by myself in regards to repeat blood counts/anemia.  I felt on her  visit in 06/29/2022 that her GIB was possibly a combination of chronic blood loss from her diverticulosis or polyps.  She had some duodenitis and remained protonix.  Her discharge hemoglobin was 9.2.  She normally has a HgB baseline of 13-14.  I started her on accrufer iron pills.  Dr. Dulce Sellar rechecked her HgB on 07/26/2022 and it was 8.  Today, she denies any hematuria, BRBPR, but she is having melena colored stools (she is on iron).  She presents to me a BP diary today where her systolic BP has been running 100-110's.        Past Medical History Past Medical History:  Diagnosis Date   Allergy  Atrial fibrillation (HCC)    CHF (congestive heart failure) (HCC)    CKD (chronic kidney disease) stage 3, GFR 30-59 ml/min (HCC)    COPD (chronic obstructive pulmonary disease) (HCC)    Essential hypertension    History of bladder cancer    Lung nodule    Myocardial infarction [facility])    ? small with atrial fib    Osteoporosis    Seasonal allergies    Sleep apnea    cpap  setting at 2.5 per patient    Tobacco abuse      Allergies Allergies  Allergen Reactions   Albuterol Palpitations   Lisinopril Cough     Medications  Current Outpatient Medications:    acetaminophen (TYLENOL) 500 MG tablet, Take 500 mg by mouth every 6 (six) hours as needed for moderate pain or headache., Disp: , Rfl:    apixaban (ELIQUIS) 5 MG TABS tablet, Take 1 tablet (5 mg total) by mouth 2 (two) times daily., Disp: 180 tablet, Rfl: 3   Bisacodyl (LAXATIVE PO), Take 1 tablet by mouth daily as needed (constipation)., Disp: , Rfl:    cetirizine (ZYRTEC) 10 MG tablet, Take 10 mg by mouth at bedtime., Disp: , Rfl:    Ferric Maltol 30 MG CAPS, Take 1 capsule (30 mg total) by mouth daily., Disp: 90 capsule, Rfl: 0   flecainide (TAMBOCOR) 50 MG tablet, Take 1 tablet (50 mg total) by mouth 2 (two) times daily., Disp: 180 tablet, Rfl: 3   fluticasone (FLONASE) 50 MCG/ACT nasal spray, Place 2 sprays into both nostrils  daily., Disp: 15.8 mL, Rfl: 0   levalbuterol (XOPENEX HFA) 45 MCG/ACT inhaler, Inhale 1-2 puffs into the lungs every 4 (four) hours as needed for wheezing., Disp: 1 each, Rfl: 12   metoprolol succinate (TOPROL-XL) 25 MG 24 hr tablet, TAKE ONE TABLET BY MOUTH TWICE DAILY WITH OR immediately following A meal, Disp: 90 tablet, Rfl: 1   montelukast (SINGULAIR) 10 MG tablet, Take 1 tablet (10 mg total) by mouth every evening., Disp: 90 tablet, Rfl: 1   naproxen (NAPROSYN) 500 MG tablet, Take 500 mg by mouth 2 (two) times daily., Disp: , Rfl:    pantoprazole (PROTONIX) 40 MG tablet, Take 40 mg by mouth daily., Disp: , Rfl:    pregabalin (LYRICA) 50 MG capsule, Take 1 capsule (50 mg total) by mouth 2 (two) times daily., Disp: 10 capsule, Rfl: 0   rosuvastatin (CRESTOR) 5 MG tablet, Take 5 mg by mouth every other day., Disp: , Rfl:    SYMBICORT 160-4.5 MCG/ACT inhaler, Inhale 2 puffs into the lungs 2 (two) times daily., Disp: 1 each, Rfl: 3   valsartan (DIOVAN) 160 MG tablet, Take 1 tablet (160 mg total) by mouth daily., Disp: 90 tablet, Rfl: 3   Review of Systems Review of Systems  Constitutional:  Negative for chills and fever.  Respiratory:  Positive for cough, sputum production and wheezing. Negative for shortness of breath.   Cardiovascular:  Negative for chest pain, palpitations and leg swelling.  Gastrointestinal:  Negative for abdominal pain, constipation, diarrhea, nausea and vomiting.  Genitourinary:  Negative for hematuria.  Musculoskeletal:  Positive for back pain and neck pain.  Neurological:  Negative for dizziness, weakness and headaches.       Objective:    Vitals BP 126/64 (BP Location: Right Arm, Patient Position: Sitting, Cuff Size: Normal)   Pulse 73   Temp 98.2 F (36.8 C) (Temporal)   Resp 20   Ht 5\' 3"  (1.6 m)  Wt 143 lb 12.8 oz (65.2 kg)   LMP  (LMP Unknown)   SpO2 95%   BMI 25.47 kg/m    Physical Examination Physical Exam Constitutional:      Appearance:  Normal appearance. She is not ill-appearing.  Cardiovascular:     Rate and Rhythm: Normal rate and regular rhythm.     Pulses: Normal pulses.     Heart sounds: No murmur heard.    No friction rub. No gallop.  Pulmonary:     Effort: Pulmonary effort is normal. No respiratory distress.     Breath sounds: No wheezing, rhonchi or rales.  Abdominal:     General: Bowel sounds are normal. There is no distension.     Palpations: Abdomen is soft.     Tenderness: There is no abdominal tenderness.  Musculoskeletal:     Right lower leg: No edema.     Left lower leg: No edema.     Comments: She has pain on the right buttock right beside her sacrum.  This is not over her SI joint and seems to be the tendon attachments to the sacrum.  She also has pain bilaterally in her neck in her bilateral paraspinous muscles.    Skin:    General: Skin is warm and dry.     Findings: No rash.  Neurological:     Mental Status: She is alert.        Assessment & Plan:   Bronchitis She is having cough of yellow sputum and states she is wheezing. I  will give her doxycycline.   Muscle strain She has muscle strain of both her neck and her buttocks.  This is MSK on exam with palpation.  We will give her a medrol dose pak and a muscle relaxant.  She also needs to probably seen massage therapy.  Bladder tumor She had a TURBT in the hospital on 08/09/2022.  She will followup with urology as directed.  Neck pain Plan as above.    No follow-ups on file.   Crist Fat, MD

## 2022-08-16 NOTE — Assessment & Plan Note (Signed)
She had a TURBT in the hospital on 08/09/2022.  She will followup with urology as directed.

## 2022-08-16 NOTE — Assessment & Plan Note (Signed)
She has muscle strain of both her neck and her buttocks.  This is MSK on exam with palpation.  We will give her a medrol dose pak and a muscle relaxant.  She also needs to probably seen massage therapy.

## 2022-08-21 ENCOUNTER — Other Ambulatory Visit: Payer: Self-pay | Admitting: Internal Medicine

## 2022-08-21 DIAGNOSIS — T7840XA Allergy, unspecified, initial encounter: Secondary | ICD-10-CM | POA: Insufficient documentation

## 2022-08-21 DIAGNOSIS — M810 Age-related osteoporosis without current pathological fracture: Secondary | ICD-10-CM | POA: Insufficient documentation

## 2022-08-21 DIAGNOSIS — Z720 Tobacco use: Secondary | ICD-10-CM | POA: Insufficient documentation

## 2022-08-21 DIAGNOSIS — R911 Solitary pulmonary nodule: Secondary | ICD-10-CM | POA: Insufficient documentation

## 2022-08-21 DIAGNOSIS — I509 Heart failure, unspecified: Secondary | ICD-10-CM | POA: Insufficient documentation

## 2022-08-21 DIAGNOSIS — N183 Chronic kidney disease, stage 3 unspecified: Secondary | ICD-10-CM | POA: Insufficient documentation

## 2022-08-21 DIAGNOSIS — I219 Acute myocardial infarction, unspecified: Secondary | ICD-10-CM | POA: Insufficient documentation

## 2022-08-22 NOTE — Progress Notes (Unsigned)
Cardiology Office Note:    Date:  08/23/2022   ID:  Dana Horton, DOB 11/13/1945, MRN [id number]  PCP:  Crist Fat, MD  Cardiologist:  Norman Herrlich, MD    Referring MD: Crist Fat, MD    ASSESSMENT:    1. PAF (paroxysmal atrial fibrillation) (HCC)   2. High risk medication use   3. Chronic anticoagulation   4. Essential hypertension   5. Iron deficiency anemia due to chronic blood loss    PLAN:    In order of problems listed above:  Doing well maintaining sinus rhythm continue flecainide continue anticoagulant will discuss next visit if they can show me surveillance at home with no recurrence consider withdrawing anticoagulant Stable hypertension continue current treatment ARB Continue iron she is remains anemic last hemoglobin 8.6 I renewed her doxycycline prescription at the request of her daughter   Next appointment: 6 months   Medication Adjustments/Labs and Tests Ordered: Current medicines are reviewed at length with the patient today.  Concerns regarding medicines are outlined above.  No orders of the defined types were placed in this encounter.  No orders of the defined types were placed in this encounter.   No chief complaint on file.   History of Present Illness:    Dana Horton is a 77 y.o. female with a hx of paroxysmal atrial fibrillation on flecainide maintaining sinus rhythm chronic anticoagulation hypertension iron deficiency anemia due to blood loss hypotension and bladder cancer last seen 07/25/2022. He has Compliance with diet, lifestyle and medications: Yes  She had bladder surgery, TURB transfused on iron and feels better On doxycycline respiratory infection Palpitation edema shortness of breath chest pain or syncope They inquired about stopping anticoagulant I asked them to check her heart rhythm daily her nurse daughter RN record with the mobile [facility] we reviewed next visit She is not having  hematuria  Past Medical History:  Diagnosis Date   Allergy    Atrial fibrillation (HCC)    Atrial fibrillation with rapid ventricular response (HCC) 04/10/2017   Bladder tumor 08/06/2022   BMI 28.0-28.9,adult 05/21/2022   Bronchitis 03/07/2022   CHF (congestive heart failure) (HCC)    Chronic anticoagulation 06/10/2019   Eliquis   Chronic diastolic heart failure (HCC)    Chronic obstructive pulmonary disease (HCC) 05/21/2022   Cigarette smoker 05/21/2022   CKD (chronic kidney disease) stage 3, GFR 30-59 ml/min (HCC)    COPD exacerbation (HCC) 04/08/2017   Demand ischemia 09/25/2016   Normal coronaries Jan 2019 when admitted with AF with RVR and chest pain   Elevated troponin    Essential hypertension    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 06/29/2022   HCAP (healthcare-associated pneumonia) 04/10/2017   History of bladder cancer    Hypercholesterolemia 05/21/2022   Iron deficiency anemia due to chronic blood loss 06/29/2022   Lung nodule    Muscle strain 08/16/2022   Myocardial infarction [facility])    ? 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/21/2022   Renal insufficiency 06/10/2019   New- medications adjusted by her PCP   Seasonal allergies    Sleep apnea    cpap  setting at  2.5 per patient    Stage 3a chronic kidney disease (HCC) 05/21/2022   Symptomatic anemia 08/07/2022   Tobacco abuse     Past Surgical History:  Procedure Laterality Date   ABDOMINAL HYSTERECTOMY     BLADDER SURGERY     COLONOSCOPY N/A 12/28/2016   Procedure: COLONOSCOPY;  Surgeon: Sherrilyn Rist, MD;  Location: [facility] ENDOSCOPY;   Service: Gastroenterology;  Laterality: N/A;   LEFT HEART CATH AND CORONARY ANGIOGRAPHY N/A 04/09/2017   Procedure: LEFT HEART CATH AND CORONARY ANGIOGRAPHY;  Surgeon: Corky Crafts, MD;  Location: [facility] INVASIVE CV LAB;  Service: Cardiovascular;  Laterality: N/A;   MOUTH SURGERY     POLYPECTOMY N/A 12/28/2016   Procedure: POLYPECTOMY with injection of [place];  Surgeon: Sherrilyn Rist, MD;  Location: [facility] ENDOSCOPY;  Service: Gastroenterology;  Laterality: N/A;   TRANSURETHRAL RESECTION OF BLADDER TUMOR N/A 08/09/2022   Procedure: TRANSURETHRAL RESECTION OF BLADDER TUMOR (TURBT) BILATERAL RETROGRADES;  Surgeon: Loletta Parish., MD;  Location: [facility] ORS;  Service: Urology;  Laterality: N/A;    Current Medications: Current Meds  Medication Sig   acetaminophen (TYLENOL) 500 MG tablet Take 500 mg by mouth every 6 (six) hours as needed for moderate pain or headache.   apixaban (ELIQUIS) 5 MG TABS tablet Take 1 tablet (5 mg total) by mouth 2 (two) times daily.   Bisacodyl (LAXATIVE PO) Take 1 tablet by mouth daily as needed (constipation).   cetirizine (ZYRTEC) 10 MG tablet Take 10 mg by mouth at bedtime.   doxycycline (MONODOX) 100 MG capsule Take 1 capsule (100 mg total) by mouth 2 (two) times daily.   Ferric Maltol 30 MG CAPS Take 1 capsule (30 mg total) by mouth daily.   flecainide (TAMBOCOR) 50 MG tablet Take 1 tablet (50 mg total) by mouth 2 (two) times daily.   fluticasone (FLONASE) 50 MCG/ACT nasal spray Place 2 sprays into both nostrils daily.   levalbuterol (XOPENEX HFA) 45 MCG/ACT inhaler Inhale 1-2 puffs into the lungs every 4 (four) hours as needed for wheezing.   methylPREDNISolone (MEDROL DOSEPAK) 4 MG TBPK tablet Take as directed   metoprolol succinate (TOPROL-XL) 25 MG 24 hr tablet TAKE ONE TABLET BY MOUTH TWICE DAILY WITH OR immediately following A meal   montelukast (SINGULAIR) 10 MG tablet Take 1 tablet (10 mg total) by mouth every evening.   naproxen (NAPROSYN) 500 MG  tablet Take 500 mg by mouth 2 (two) times daily.   pantoprazole (PROTONIX) 40 MG tablet Take 40 mg by mouth daily.   pregabalin (LYRICA) 50 MG capsule Take 1 capsule (50 mg total) by mouth 2 (two) times daily.   rosuvastatin (CRESTOR) 5 MG tablet Take 5 mg by mouth every other day.   SYMBICORT 160-4.5 MCG/ACT inhaler Inhale 2 puffs into the lungs 2 (two) times daily.   tiZANidine (ZANAFLEX) 2 MG tablet TAKE ONE TABLET BY MOUTH THREE TIMES DAILY AS NEEDED FOR muscle SPASMS.   valsartan (DIOVAN) 160 MG tablet Take 1 tablet (160 mg total) by mouth daily.     Allergies:   Albuterol and Lisinopril   Social History   Socioeconomic History   Marital status: Married    Spouse name: Not on file   Number of children: 3   Years of education: Not on file   Highest education level: Not on file  Occupational History   Occupation: Retired  Tobacco Use   Smoking status: Every Day    Packs/day: 0.50    Years:  52.00    Additional pack years: 0.00    Total pack years: 26.00    Types: Cigarettes   Smokeless tobacco: Never   Tobacco comments:    1/2 pack a day  Vaping Use   Vaping Use: Never used  Substance and Sexual Activity   Alcohol use: No   Drug use: No   Sexual activity: Not on file  Other Topics Concern   Not on file  Social History Narrative   Not on file   Social Determinants of Health   Financial Resource Strain: Not on file  Food Insecurity: No Food Insecurity (08/07/2022)   Hunger Vital Sign    Worried About Running Out of Food in the Last Year: Never true    Ran Out of Food in the Last Year: Never true  Transportation Needs: No Transportation Needs (08/07/2022)   PRAPARE - Administrator, Civil Service (Medical): No    Lack of Transportation (Non-Medical): No  Physical Activity: Not on file  Stress: Not on file  Social Connections: Not on file     Family History: The patient's family history includes Arrhythmia in her father; Bladder Cancer in her brother;  CAD in her father; Cancer in her father; Heart disease in her father; Hypertension in her mother; Lung cancer in her brother; Stroke in her mother. There is no history of Colon cancer, Esophageal cancer, Stomach cancer, or Colon polyps. ROS:   Please see the history of present illness.    All other systems reviewed and are negative.  EKGs/Labs/Other Studies Reviewed:    The following studies were reviewed today:  Cardiac Studies & Procedures   CARDIAC CATHETERIZATION  CARDIAC CATHETERIZATION 04/09/2017  Narrative  The left ventricular systolic function is normal.  LV end diastolic pressure is normal. LVEDP 16 mm Hg.  The left ventricular ejection fraction is greater than 65% by visual estimate.  There is no aortic valve stenosis.  No angiographically apparent CAD.  Continue medical therapy.  Restart heparin gtt 8 hours after sheath pull for AFib/stroke prevention.  Findings Coronary Findings Diagnostic  Dominance: Right  Left Main Vessel was injected. Vessel is normal in caliber. Vessel is angiographically normal.  Left Anterior Descending Vessel was injected. Vessel is normal in caliber. Vessel is angiographically normal.  Left Circumflex Vessel was injected. Vessel is normal in caliber. Vessel is angiographically normal.  Right Coronary Artery Vessel was injected. Vessel is normal in caliber. Vessel is angiographically normal.  Intervention  No interventions have been documented.   CARDIAC CATHETERIZATION  CARDIAC CATHETERIZATION 05/01/2017                EKG:  EKG ordered today and personally reviewed.  The ekg ordered today demonstrates sinus rhythm normal EKG no 1C antiarrhythmic drug toxicity  Recent Labs: 05/21/2022: ALT 12; TSH 1.140 08/09/2022: BUN 10; Creatinine, Ser 0.72; Hemoglobin 8.6; Platelets 180; Potassium 3.7; Sodium 136  Recent Lipid Panel    Component Value Date/Time   CHOL 172 05/21/2022 1059   TRIG 95 05/21/2022 1059   HDL 44  05/21/2022 1059   CHOLHDL 3.9 05/21/2022 1059   LDLCALC 110 (H) 05/21/2022 1059    Physical Exam:    VS:  BP 118/70   Pulse 80   Ht 5' 3.6" (1.615 m)   Wt 145 lb 9.6 oz (66 kg)   LMP  (LMP Unknown)   SpO2 95%   BMI 25.31 kg/m     Wt Readings from Last 3 Encounters:  08/23/22  145 lb 9.6 oz (66 kg)  08/16/22 143 lb 12.8 oz (65.2 kg)  08/06/22 149 lb (67.6 kg)     GEN: Looks pale well nourished, well developed in no acute distress HEENT: Normal NECK: No JVD; No carotid bruits LYMPHATICS: No lymphadenopathy CARDIAC: RRR, no murmurs, rubs, gallops RESPIRATORY:  Clear to auscultation without rales, wheezing or rhonchi  ABDOMEN: Soft, non-tender, non-distended MUSCULOSKELETAL:  No edema; No deformity  SKIN: Warm and dry NEUROLOGIC:  Alert and oriented x 3 PSYCHIATRIC:  Normal affect    Signed, Norman Herrlich, MD  08/23/2022 3:26 PM    [facility]

## 2022-08-23 ENCOUNTER — Encounter: Payer: Self-pay | Admitting: Cardiology

## 2022-08-23 ENCOUNTER — Ambulatory Visit: Payer: Medicare Other | Attending: Cardiology | Admitting: Cardiology

## 2022-08-23 VITALS — BP 118/70 | HR 80 | Ht 63.6 in | Wt 145.6 lb

## 2022-08-23 DIAGNOSIS — Z7901 Long term (current) use of anticoagulants: Secondary | ICD-10-CM | POA: Diagnosis not present

## 2022-08-23 DIAGNOSIS — D500 Iron deficiency anemia secondary to blood loss (chronic): Secondary | ICD-10-CM | POA: Insufficient documentation

## 2022-08-23 DIAGNOSIS — I480 Paroxysmal atrial fibrillation: Secondary | ICD-10-CM | POA: Diagnosis not present

## 2022-08-23 DIAGNOSIS — Z79899 Other long term (current) drug therapy: Secondary | ICD-10-CM | POA: Insufficient documentation

## 2022-08-23 DIAGNOSIS — I10 Essential (primary) hypertension: Secondary | ICD-10-CM | POA: Insufficient documentation

## 2022-08-23 MED ORDER — DOXYCYCLINE MONOHYDRATE 100 MG PO CAPS: 100.0000 mg | ORAL_CAPSULE | Freq: Two times a day (BID) | ORAL | 0 refills | Status: DC

## 2022-08-23 NOTE — Patient Instructions (Signed)
Medication Instructions:  Your physician recommends that you continue on your current medications as directed. Please refer to the Current Medication list given to you today.  *If you need a refill on your cardiac medications before your next appointment, please call your pharmacy*   Lab Work: None If you have labs (blood work) drawn today and your tests are completely normal, you will receive your results only by: MyChart Message (if you have MyChart) OR A paper copy in the mail If you have any lab test that is abnormal or we need to change your treatment, we will call you to review the results.   Testing/Procedures: None   Follow-Up: At [facility], you and your health needs are our priority.  As part of our continuing mission to provide you with exceptional heart care, we have created designated Provider Care Teams.  These Care Teams include your primary Cardiologist (physician) and Advanced Practice Providers (APPs -  Physician Assistants and Nurse Practitioners) who all work together to provide you with the care you need, when you need it.  We recommend signing up for the patient portal called "MyChart".  Sign up information is provided on this After Visit Summary.  MyChart is used to connect with patients for Virtual Visits (Telemedicine).  Patients are able to view lab/test results, encounter notes, upcoming appointments, etc.  Non-urgent messages can be sent to your provider as well.   To learn more about what you can do with MyChart, go to [email or web address].    Your next appointment:   6 month(s)  Provider:   Brian Munley, MD    Other Instructions None  

## 2022-08-27 ENCOUNTER — Telehealth: Payer: Self-pay | Admitting: Cardiology

## 2022-08-27 NOTE — Telephone Encounter (Signed)
Calling to report critical results for pt. Call transferred

## 2022-08-29 NOTE — Telephone Encounter (Signed)
Received a call from [employer] with an iRhythm alert for this patient. [employer] stated that the patient had a run of A-fib at 9:32 pm for 1 minute and 7 seconds. The average heart rate was 118 bpm. The zio heart monitor report was reviewed by Dr. Dulce Sellar and there were no new orders because she was known to have A-fib. Patient was informed of Dr. Hulen Shouts recommendation and had no further questions at this time.

## 2022-08-31 ENCOUNTER — Telehealth: Payer: Self-pay

## 2022-08-31 NOTE — Telephone Encounter (Signed)
-----   Message from Baldo Daub, MD sent at 08/30/2022  5:45 PM EDT ----- There was 1 episode of brief atrial fibrillation  I would continue her current medication including flecainide  At this time I would not advise discontinuing anticoagulation with recurrence documented

## 2022-08-31 NOTE — Telephone Encounter (Signed)
Patient notified of results and recommendations and agreed with plan.  

## 2022-09-03 ENCOUNTER — Encounter: Payer: Self-pay | Admitting: Internal Medicine

## 2022-09-03 ENCOUNTER — Ambulatory Visit: Payer: Medicare Other | Admitting: Internal Medicine

## 2022-09-03 VITALS — BP 116/58 | HR 96 | Temp 98.1°F | Resp 18 | Ht 63.0 in | Wt 144.4 lb

## 2022-09-03 DIAGNOSIS — J441 Chronic obstructive pulmonary disease with (acute) exacerbation: Secondary | ICD-10-CM

## 2022-09-03 DIAGNOSIS — D500 Iron deficiency anemia secondary to blood loss (chronic): Secondary | ICD-10-CM | POA: Diagnosis not present

## 2022-09-03 MED ORDER — METHYLPREDNISOLONE SODIUM SUCC 40 MG IJ SOLR
80.0000 mg | Freq: Once | INTRAMUSCULAR | Status: AC
Start: 2022-09-03 — End: 2022-09-03
  Administered 2022-09-03: 80 mg via INTRAMUSCULAR

## 2022-09-03 MED ORDER — AMOXICILLIN-POT CLAVULANATE 875-125 MG PO TABS: 1.0000 | ORAL_TABLET | Freq: Two times a day (BID) | ORAL | 0 refills | Status: DC

## 2022-09-03 MED ORDER — IPRATROPIUM-ALBUTEROL 0.5-2.5 (3) MG/3ML IN SOLN
3.0000 mL | Freq: Once | RESPIRATORY_TRACT | Status: DC
Start: 2022-09-03 — End: 2023-02-09

## 2022-09-03 MED ORDER — AZITHROMYCIN 250 MG PO TABS: ORAL_TABLET | ORAL | 0 refills | Status: DC

## 2022-09-03 NOTE — Assessment & Plan Note (Signed)
She has had 2 etiologies for her anemia including GIB and history of hematuria from a bladder tumor.  We will check her CBC and iron studies today.

## 2022-09-03 NOTE — Assessment & Plan Note (Signed)
She is having a COPD exacerbation.  She has had problems with bronchitis and has received 2 rounds of antibiotics per the patient.  I will get a CXR today.  We will give her solumedrol 80mg  IM x 1 and a duoneb here in the office.  She will go home on medrol dose pak as well as augmentin and azithromycin.  She is to take her nebs scheduled at home for the next 3 days.

## 2022-09-03 NOTE — Progress Notes (Signed)
Office Visit  Subjective   Patient ID: Dana Horton   DOB: [birth year]   Age: 77 y.o.   MRN: [id number]   Chief Complaint Chief Complaint  Patient presents with   Follow-up    Still not feeling better     History of Present Illness Dana Horton is a 77 yo female who returns today for followup of her anemia.   She was recently admitted to [facility] from 08/06/2022 until 08/09/2022 for symptomatic anemia.   I had seen her earlier that day where they had told me that the month before she was diagnosed with a new bladder tumor.  I checked a HgB later that day but unforunately she developed new acute hematuria which prompted her to go to the ER.  She does have a PMHx of atrial fibrillation on Eliquis.  She presented to the ER with blood in the urine and flank pain on right and left side also having neck pain. She was having hematuria on and off over the past month. She was recently admitted to [facility] with upper/lower endoscopy without any findings.  They did not transfuse at that time. Outpatient cystoscopy in [place] (Dr Saddie Benders) with concern for recurrent bladder tumor.  The patient was admitted as above with acute symptomatic anemia, likely blood loss complicated by iron deficiency in the setting of recurrent bladder tumor.  Patient had a TURBT onn 08/09/2022 with urology which she tolerated well. Her discharge hemoglobin was 8.6  They wanted her to resume her eliquis on 5/20.  She remains on eliquis at this time and she denies any hematuria today.  The patient was diagnosed with bladder cancer in 2017 where they noted a mass on her bladder on CT scan. She underwent resection with TURBT and did not require chemo, radiation or BCG infusions. Urology has been following her for survelliance where she last saw urology in 12/2021 and they did a cystoscopy which was normal.  However, she tells me she went back for a surveillance cystoscopy in 06/2022 and they found another bladder tumor.     I also her a few months ago where she was admitted to [facility] from 06/23/2022 until 06/26/2022 due to GIB.  She presented to [facility] with melena that changed to BRBPR where she has a history significant for A. Fib on anticoagulation with eliquis.  RH did a CTA of her abd/pelvis which did not show any active bleed She was transferred to [facility] on the above date where her HgB on transfer was 8.7.  Her hemoglobin remained stable during her hospitalization with a discharge hemoglobin of 9.2 on 06/26/2022.  She did not need a blood transfusion during her hospital stay.  They did iron studies and this showed an iron of 24 and ferritin level of 44.  GI was consulted and they performed an EGD and colonoscopy on 06/26/2022.  Her EGD showed that the esophagus and stomach appeared normal.  There was one sessile polyp measuring smaller than 5 mm in the duodenal bulb which was biopsied.  There was some mild erythematous mucosa (duodenitis) in the duodenal bulb and scalloped mucosa in the 1st part of the duodenum which was biopsied.  The 2nd part of the duodenum appeared normal.  They were continue to check H. Pylori and continued her pantoproazole and resume her eliquis.  Her colonoscopy showed no evidence of blood, no stigmata of recent bleeding, with a healthy appearing however there was extensive diverticulosis of severe severity and causing severe  luminal narrowing with 3 prolapsing (grade 2) hemorrhoids and 11 subcentimeter polyps were removed.  I have reviewed her EPIC chart and I do not see her pathology reports back.  They recommend repeating a colonoscopy in 1 year.  They resumed her diet and she was discharged home.  She was sent home on pantoproazole and they stopped her farxiga for her Stage III CKD due to "vaginal burning".  Today, she denies any more bleeding abdominal pain, dizziness, chest pain, palpitations, generalized weakness or SOB.  She states she stopped taking all her meds the night she went to  the hospital.  She restarted her meds and when she took hydralazine 50mg  BID her SBP dropped to 97.  She states she got some weakness and SOB.  I asked her on 4/5 to stop the hydralazine and continue on metoprolol sucinate and valsartan.  She is checking her BP like 10 times per day and I asked her to stop this and check it once about 3 hours after taking her BP meds.  She can take her BP if she has symptoms.  Over the interim, she did see Dr. Dulce Sellar on 07/26/2022 for preoperative evaluation for her bladder tumor as described above.  She was having intermittent hypotension and weakness where he felt that we should hold on elective urologic surgery followed in this note and direct back to her PCP she may require hematology consultation and perhaps IV iron.  He adjustted her flecainide level for her A. Fib and decreased her valsartan to 160mg  daily.  They have placed an event monitor to monitor for bradycardia and this will be completed tomorrow per the patient.  Dr. Dulce Sellar wanted her to be seen by myself in regards to repeat blood counts/anemia.  I felt on her visit in 06/29/2022 that her GIB was possibly a combination of chronic blood loss from her diverticulosis or polyps.  She had some duodenitis and remained protonix.  Her discharge hemoglobin was 9.2.  She normally has a HgB baseline of 13-14.  I started her on accrufer iron pills.  Dr. Dulce Sellar rechecked her HgB on 07/26/2022 and it was 8.  Today, she denies any hematuria, BRBPR, but she is having melena colored stools (she is on iron).  She still has some generalized weakness/fatigue and occasional palpitations.  The patient began having a fever on Saturday 2 days ago.  She has right sided congestion with clear nasal discharge.  She is having cough productive of yellow sputum.  She also states she has wheezing.   She otherwise denies SOB, nausea, vomiting, diarrhea, myalagias or other problems.       Past Medical History Past Medical History:  Diagnosis Date    Allergy    Atrial fibrillation (HCC)    Atrial fibrillation with rapid ventricular response (HCC) 04/10/2017   Bladder tumor 08/06/2022   BMI 28.0-28.9,adult 05/21/2022   Bronchitis 03/07/2022   CHF (congestive heart failure) (HCC)    Chronic anticoagulation 06/10/2019   Eliquis   Chronic diastolic heart failure (HCC)    Chronic obstructive pulmonary disease (HCC) 05/21/2022   Cigarette smoker 05/21/2022   CKD (chronic kidney disease) stage 3, GFR 30-59 ml/min (HCC)    COPD exacerbation (HCC) 04/08/2017   Demand ischemia 09/25/2016   Normal coronaries Jan 2019 when admitted with AF with RVR and chest pain   Elevated troponin    Essential hypertension    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 06/29/2022   HCAP (healthcare-associated pneumonia) 04/10/2017   History of  bladder cancer    Hypercholesterolemia 05/21/2022   Iron deficiency anemia due to chronic blood loss 06/29/2022   Lung nodule    Muscle strain 08/16/2022   Myocardial infarction [facility])    ? 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/21/2022   Renal insufficiency 06/10/2019   New- medications adjusted by her PCP   Seasonal allergies    Sleep apnea    cpap  setting at 2.5 per patient    Stage 3a chronic kidney disease (HCC) 05/21/2022   Symptomatic anemia 08/07/2022   Tobacco abuse      Allergies Allergies  Allergen Reactions   Albuterol Palpitations   Lisinopril Cough     Medications  Current Outpatient Medications:    acetaminophen (TYLENOL) 500 MG tablet, Take 500 mg by mouth every 6 (six) hours as needed for moderate pain or headache., Disp:  , Rfl:    apixaban (ELIQUIS) 5 MG TABS tablet, Take 1 tablet (5 mg total) by mouth 2 (two) times daily., Disp: 180 tablet, Rfl: 3   Bisacodyl (LAXATIVE PO), Take 1 tablet by mouth daily as needed (constipation)., Disp: , Rfl:    cetirizine (ZYRTEC) 10 MG tablet, Take 10 mg by mouth at bedtime., Disp: , Rfl:    doxycycline (MONODOX) 100 MG capsule, Take 1 capsule (100 mg total) by mouth 2 (two) times daily., Disp: 20 capsule, Rfl: 0   Ferric Maltol 30 MG CAPS, Take 1 capsule (30 mg total) by mouth daily., Disp: 90 capsule, Rfl: 0   flecainide (TAMBOCOR) 50 MG tablet, Take 1 tablet (50 mg total) by mouth 2 (two) times daily., Disp: 180 tablet, Rfl: 3   fluticasone (FLONASE) 50 MCG/ACT nasal spray, Place 2 sprays into both nostrils daily., Disp: 15.8 mL, Rfl: 0   levalbuterol (XOPENEX HFA) 45 MCG/ACT inhaler, Inhale 1-2 puffs into the lungs every 4 (four) hours as needed for wheezing., Disp: 1 each, Rfl: 12   metoprolol succinate (TOPROL-XL) 25 MG 24 hr tablet, TAKE ONE TABLET BY MOUTH TWICE DAILY WITH OR immediately following A meal, Disp: 90 tablet, Rfl: 1   montelukast (SINGULAIR) 10 MG tablet, Take 1 tablet (10 mg total) by mouth every evening., Disp: 90 tablet, Rfl: 1   naproxen (NAPROSYN) 500 MG tablet, Take 500 mg by mouth 2 (two) times daily., Disp: , Rfl:    pantoprazole (PROTONIX) 40 MG tablet, Take 40 mg by mouth daily., Disp: , Rfl:    pregabalin (LYRICA) 50 MG capsule, Take 1 capsule (50 mg total) by mouth 2 (two) times daily., Disp: 60 capsule, Rfl: 0   rosuvastatin (CRESTOR) 5 MG tablet, Take 5 mg by mouth every other day., Disp: , Rfl:    SYMBICORT 160-4.5 MCG/ACT inhaler, Inhale 2 puffs into the lungs 2 (two) times daily., Disp: 1 each, Rfl: 3   tiZANidine (ZANAFLEX) 2 MG tablet, TAKE ONE TABLET BY MOUTH THREE TIMES DAILY AS NEEDED FOR muscle SPASMS., Disp: 12 tablet, Rfl: 0   valsartan (DIOVAN) 160 MG tablet, Take 1 tablet (160 mg total) by mouth daily., Disp: 90 tablet, Rfl: 3    Review of Systems Review of Systems  Constitutional:  Negative for chills and fever.  Eyes:  Negative for blurred vision and double vision.  Respiratory:  Positive for sputum production. Negative for shortness of breath.   Cardiovascular:  Positive for palpitations. Negative for chest pain and leg swelling.  Gastrointestinal:  Positive for melena. Negative for abdominal pain, blood in stool, constipation, diarrhea, nausea and vomiting.  Genitourinary:  Negative for frequency and hematuria.  Neurological:  Negative for dizziness, weakness and headaches.       Objective:    Vitals BP (!) 116/58 (BP Location: Left Arm, Patient Position: Sitting, Cuff Size: Normal)   Pulse 96   Temp 98.1 F (36.7 C) (Temporal)   Resp 18   Ht 5\' 3"  (1.6 m)   Wt 144 lb 6.4 oz (65.5 kg)   LMP  (LMP Unknown)   SpO2 96%   BMI 25.58 kg/m    Physical Examination Physical Exam Constitutional:      Appearance: Normal appearance. She is not ill-appearing.  Cardiovascular:     Rate and Rhythm: Normal rate and regular rhythm.     Pulses: Normal pulses.     Heart sounds: No murmur heard.    No friction rub. No gallop.  Pulmonary:     Effort: Pulmonary effort is normal. No respiratory distress.     Breath sounds: Wheezing present. No rhonchi or rales.  Abdominal:     General: Bowel sounds are normal. There is no distension.     Palpations: Abdomen is soft.     Tenderness: There is no abdominal tenderness.  Musculoskeletal:     Right lower leg: No edema.     Left lower leg: No edema.  Skin:    General: Skin is warm and dry.     Findings: No rash.  Neurological:     Mental Status: She is alert.        Assessment & Plan:   COPD exacerbation (HCC) She is having a COPD exacerbation.  She has had problems with bronchitis and has received 2 rounds of antibiotics per the patient.  I will get a CXR today.  We will give her solumedrol 80mg  IM x 1 and a duoneb here in the office.  She will go home on  medrol dose pak as well as augmentin and azithromycin.  She is to take her nebs scheduled at home for the next 3 days.  Iron deficiency anemia due to chronic blood loss She has had 2 etiologies for her anemia including GIB and history of hematuria from a bladder tumor.  We will check her CBC and iron studies today.    Return in about 3 months (around 12/04/2022).   Crist Fat, MD

## 2022-09-04 DIAGNOSIS — C678 Malignant neoplasm of overlapping sites of bladder: Secondary | ICD-10-CM | POA: Diagnosis not present

## 2022-09-04 DIAGNOSIS — N281 Cyst of kidney, acquired: Secondary | ICD-10-CM | POA: Diagnosis not present

## 2022-09-04 DIAGNOSIS — D3502 Benign neoplasm of left adrenal gland: Secondary | ICD-10-CM | POA: Diagnosis not present

## 2022-09-04 LAB — IRON AND TIBC
Iron Saturation: 7 % — CL (ref 15–55)
Iron: 13 ug/dL — ABNORMAL LOW (ref 27–139)
Total Iron Binding Capacity: 178 ug/dL — ABNORMAL LOW (ref 250–450)
UIBC: 165 ug/dL (ref 118–369)

## 2022-09-04 LAB — CBC WITH DIFFERENTIAL/PLATELET
Basophils Absolute: 0 10*3/uL (ref 0.0–0.2)
Basos: 0 %
EOS (ABSOLUTE): 0.1 10*3/uL (ref 0.0–0.4)
Eos: 1 %
Hematocrit: 27.4 % — ABNORMAL LOW (ref 34.0–46.6)
Hemoglobin: 8.4 g/dL — ABNORMAL LOW (ref 11.1–15.9)
Immature Grans (Abs): 0 10*3/uL (ref 0.0–0.1)
Immature Granulocytes: 0 %
Lymphocytes Absolute: 1.3 10*3/uL (ref 0.7–3.1)
Lymphs: 21 %
MCH: 25.5 pg — ABNORMAL LOW (ref 26.6–33.0)
MCHC: 30.7 g/dL — ABNORMAL LOW (ref 31.5–35.7)
MCV: 83 fL (ref 79–97)
Monocytes Absolute: 0.6 10*3/uL (ref 0.1–0.9)
Monocytes: 9 %
Neutrophils Absolute: 4.3 10*3/uL (ref 1.4–7.0)
Neutrophils: 69 %
Platelets: 157 10*3/uL (ref 150–450)
RBC: 3.3 x10E6/uL — ABNORMAL LOW (ref 3.77–5.28)
RDW: 17 % — ABNORMAL HIGH (ref 11.7–15.4)
WBC: 6.3 10*3/uL (ref 3.4–10.8)

## 2022-09-04 LAB — FOLATE RBC

## 2022-09-04 LAB — RETICULOCYTES: Retic Ct Pct: 1.5 % (ref 0.6–2.6)

## 2022-09-04 LAB — FERRITIN: Ferritin: 139 ng/mL (ref 15–150)

## 2022-09-04 LAB — VITAMIN B12: Vitamin B-12: 815 pg/mL (ref 232–1245)

## 2022-09-05 ENCOUNTER — Encounter: Payer: Self-pay | Admitting: Cardiology

## 2022-09-05 DIAGNOSIS — R059 Cough, unspecified: Secondary | ICD-10-CM | POA: Diagnosis not present

## 2022-09-05 DIAGNOSIS — J449 Chronic obstructive pulmonary disease, unspecified: Secondary | ICD-10-CM | POA: Diagnosis not present

## 2022-09-07 ENCOUNTER — Other Ambulatory Visit: Payer: Self-pay | Admitting: Urology

## 2022-09-19 DIAGNOSIS — D509 Iron deficiency anemia, unspecified: Secondary | ICD-10-CM | POA: Diagnosis not present

## 2022-09-25 NOTE — Patient Instructions (Addendum)
SURGICAL WAITING ROOM VISITATION  Patients having surgery or a procedure may have no more than 2 support people in the waiting area - these visitors may rotate.    Children under the age of 62 must have an adult with them who is not the patient.  If the patient needs to stay at the hospital during part of their recovery, the visitor guidelines for inpatient rooms apply. Pre-op nurse will coordinate an appropriate time for 1 support person to accompany patient in pre-op.  This support person may not rotate.    Please refer to the [facility] website for the visitor guidelines for Inpatients (after your surgery is over and you are in a regular room).       Your procedure is scheduled on: 10-05-22   Report to [facility] Main Entrance    Report to admitting at        0845  AM   Call this number if you have problems the morning of surgery [phone]   Do not eat food   or drink liquids  :After Midnight. Except sips of water with meds                 If you have questions, please contact your surgeon's office.   FOLLOW  ANY ADDITIONAL PRE OP INSTRUCTIONS YOU RECEIVED FROM YOUR SURGEON'S OFFICE!!!     Oral Hygiene is also important to reduce your risk of infection.                                    Remember - BRUSH YOUR TEETH THE MORNING OF SURGERY WITH YOUR REGULAR TOOTHPASTE  DENTURES WILL BE REMOVED PRIOR TO SURGERY PLEASE DO NOT APPLY "Poly grip" OR ADHESIVES!!!   Do NOT smoke after Midnight   Take these medicines the morning of surgery with A SIP OF WATER: INHALERS, nebulizer if needed, pantoprazole, pregabalin, metoprolol, flecainide, tylenol if needed    Bring CPAP mask and tubing day of surgery.                              You may not have any metal on your body including hair pins, jewelry, and body piercing             Do not wear make-up, lotions, powders, perfumes/cologne, or deodorant  Do not wear nail polish including gel and S&S,  artificial/acrylic nails, or any other type of covering on natural nails including finger and toenails. If you have artificial nails, gel coating, etc. that needs to be removed by a nail salon please have this removed prior to surgery or surgery may need to be canceled/ delayed if the surgeon/ anesthesia feels like they are unable to be safely monitored.   Do not shave  48 hours prior to surgery.               Do not bring valuables to the hospital. [facility] IS NOT             RESPONSIBLE   FOR VALUABLES.   Contacts, glasses, dentures or bridgework may not be worn into surgery.   Bring small overnight bag day of surgery.   DO NOT BRING YOUR HOME MEDICATIONS TO THE HOSPITAL. PHARMACY WILL DISPENSE MEDICATIONS LISTED ON YOUR MEDICATION LIST TO YOU DURING YOUR ADMISSION IN THE HOSPITAL!    Patients discharged on  the day of surgery will not be allowed to drive home.  Someone NEEDS to stay with you for the first 24 hours after anesthesia.   Special Instructions: Bring a copy of your healthcare power of attorney and living will documents the day of surgery if you haven't scanned them before.              Please read over the following fact sheets you were given: IF YOU HAVE QUESTIONS ABOUT YOUR PRE-OP INSTRUCTIONS PLEASE CALL [phone]   I If you test positive for Covid or have been in contact with anyone that has tested positive in the last 10 days please notify you surgeon.    [place] - Preparing for Surgery Before surgery, you can play an important role.  Because skin is not sterile, your skin needs to be as free of germs as possible.  You can reduce the number of germs on your skin by washing with CHG (chlorahexidine gluconate) soap before surgery.  CHG is an antiseptic cleaner which kills germs and bonds with the skin to continue killing germs even after washing. Please DO NOT use if you have an allergy to CHG or antibacterial soaps.  If your skin becomes reddened/irritated stop  using the CHG and inform your nurse when you arrive at Short Stay. Do not shave (including legs and underarms) for at least 48 hours prior to the first CHG shower.  You may shave your face/neck. Please follow these instructions carefully:  1.  Shower with CHG Soap the night before surgery and the  morning of Surgery.  2.  If you choose to wash your hair, wash your hair first as usual with your  normal  shampoo.  3.  After you shampoo, rinse your hair and body thoroughly to remove the  shampoo.                           4.  Use CHG as you would any other liquid soap.  You can apply chg directly  to the skin and wash                       Gently with a scrungie or clean washcloth.  5.  Apply the CHG Soap to your body ONLY FROM THE NECK DOWN.   Do not use on face/ open                           Wound or open sores. Avoid contact with eyes, ears mouth and genitals (private parts).                       Wash face,  Genitals (private parts) with your normal soap.             6.  Wash thoroughly, paying special attention to the area where your surgery  will be performed.  7.  Thoroughly rinse your body with warm water from the neck down.  8.  DO NOT shower/wash with your normal soap after using and rinsing off  the CHG Soap.                9.  Pat yourself dry with a clean towel.            10.  Wear clean pajamas.            11.  Place clean  sheets on your bed the night of your first shower and do not  sleep with pets. Day of Surgery : Do not apply any lotions/deodorants the morning of surgery.  Please wear clean clothes to the hospital/surgery center.  FAILURE TO FOLLOW THESE INSTRUCTIONS MAY RESULT IN THE CANCELLATION OF YOUR SURGERY PATIENT SIGNATURE_________________________________  NURSE SIGNATURE__________________________________  ________________________________________________________________________

## 2022-09-25 NOTE — Progress Notes (Addendum)
PCP - Crist Fat, MD L[facility] 09-03-22 epic Cardiologist - Norman Herrlich [facility] 08-23-22 epic   preop eval 07-25-22 epic  PPM/ICD -  Device Orders -  Rep Notified -   Chest x-ray -  EKG - 07-25-22 epic Stress Test -  ECHO - 2018 Cardiac Cath - 2019 epic  Sleep Study -  CPAP - yes  Fasting Blood Sugar -  Checks Blood Sugar _____ times a day  Blood Thinner Instructions: Eliquis  stopped 09-17-22 Aspirin Instructions:  ERAS Protcol - PRE-SURGERY Ensure or G2-    Activity--Able to complete ADL's without SOB or CP Anesthesia review: HTN, Afib, CHF, MI , COPD, OSA, CKD, murmur Jessica Ward PA-C aware and listened to murmur  at preop. Hgb 9.7  Patient denies shortness of breath, fever, cough and chest pain at PAT appointment   All instructions explained to the patient, with a verbal understanding of the material. Patient agrees to go over the instructions while at home for a better understanding. Patient also instructed to self quarantine after being tested for COVID-19. The opportunity to ask questions was provided.

## 2022-09-26 DIAGNOSIS — D509 Iron deficiency anemia, unspecified: Secondary | ICD-10-CM | POA: Diagnosis not present

## 2022-09-28 ENCOUNTER — Encounter (HOSPITAL_COMMUNITY): Payer: Self-pay

## 2022-09-28 ENCOUNTER — Encounter (HOSPITAL_COMMUNITY)
Admission: RE | Admit: 2022-09-28 | Discharge: 2022-09-28 | Disposition: A | Discharge status: Home / Self Care | Payer: Medicare Other | Source: Ambulatory Visit | Attending: Urology | Admitting: Urology

## 2022-09-28 ENCOUNTER — Other Ambulatory Visit: Payer: Self-pay

## 2022-09-28 VITALS — BP 140/50 | HR 75 | Temp 98.4°F | Resp 16 | Ht 63.5 in | Wt 142.0 lb

## 2022-09-28 DIAGNOSIS — I482 Chronic atrial fibrillation, unspecified: Secondary | ICD-10-CM | POA: Insufficient documentation

## 2022-09-28 DIAGNOSIS — Z01818 Encounter for other preprocedural examination: Secondary | ICD-10-CM | POA: Insufficient documentation

## 2022-09-28 HISTORY — DX: Cardiac arrhythmia, unspecified: I49.9

## 2022-09-28 HISTORY — DX: Gastro-esophageal reflux disease without esophagitis: K21.9

## 2022-09-28 LAB — CBC
HCT: 32.4 % — ABNORMAL LOW (ref 36.0–46.0)
Hemoglobin: 9.7 g/dL — ABNORMAL LOW (ref 12.0–15.0)
MCH: 27.6 pg (ref 26.0–34.0)
MCHC: 29.9 g/dL — ABNORMAL LOW (ref 30.0–36.0)
MCV: 92.3 fL (ref 80.0–100.0)
Platelets: 177 10*3/uL (ref 150–400)
RBC: 3.51 MIL/uL — ABNORMAL LOW (ref 3.87–5.11)
RDW: 22.3 % — ABNORMAL HIGH (ref 11.5–15.5)
WBC: 7.9 10*3/uL (ref 4.0–10.5)
nRBC: 0 % (ref 0.0–0.2)

## 2022-09-28 LAB — BASIC METABOLIC PANEL
Anion gap: 7 (ref 5–15)
BUN: 19 mg/dL (ref 8–23)
CO2: 24 mmol/L (ref 22–32)
Calcium: 9 mg/dL (ref 8.9–10.3)
Chloride: 108 mmol/L (ref 98–111)
Creatinine, Ser: 0.9 mg/dL (ref 0.44–1.00)
GFR, Estimated: >60 mL/min (ref >60)
Glucose, Bld: 127 mg/dL — ABNORMAL HIGH (ref 70–99)
Potassium: 4.1 mmol/L (ref 3.5–5.1)
Sodium: 139 mmol/L (ref 135–145)

## 2022-10-03 NOTE — Progress Notes (Signed)
Anesthesia Chart Review   Case: [id number] Date/Time: 10/05/22 1045   Procedure: RESTAGING TRANSURETHRAL RESECTION OF BLADDER TUMOR (TURBT) - 1 HR   Anesthesia type: General   Pre-op diagnosis: BLADDER CANCER   Location: [facility]OM 03 / [facility]   Surgeons: Loletta Parish., MD       DISCUSSION:76 y.o. smoker with h/o HTN, sleep apnea on CPAP, CAD, paroxysmal atrial fibrillation, CKD Stage III, COPD, bladder cancer scheduled for above procedure 10/05/2022 with Dr. Sebastian Ache.   Pt last seen by cardiology 08/23/2022. At this visit maintaining sinus rhythm.  Per previous cardio note on 07/25/22, "Cardiology perspective she is optimized for planned urologic surgery I have adjusted cardiac medications and she can proceed with planned urologic surgery"  S/p TURBT with no anesthesia complications.  VS: BP (!) 140/50   Pulse 75   Temp 36.9 C (Oral)   Resp 16   Ht 5' 3.5" (1.613 m)   Wt 64.4 kg   LMP  (LMP Unknown)   SpO2 100%   BMI 24.76 kg/m   PROVIDERS: Crist Fat, MD is PCP   Norman Herrlich, MD is Cardiologist  LABS: Labs reviewed: Acceptable for surgery. (all labs ordered are listed, but only abnormal results are displayed)  Labs Reviewed  BASIC METABOLIC PANEL - Abnormal; Notable for the following components:      Result Value   Glucose, Bld 127 (*)    All other components within normal limits  CBC - Abnormal; Notable for the following components:   RBC 3.51 (*)    Hemoglobin 9.7 (*)    HCT 32.4 (*)    MCHC 29.9 (*)    RDW 22.3 (*)    All other components within normal limits     IMAGES:   EKG:   CV: Cardiac Cath 04/09/2017  The left ventricular systolic function is normal. LV end diastolic pressure is normal. LVEDP 16 mm Hg. The left ventricular ejection fraction is greater than 65% by visual estimate. There is no aortic valve stenosis. No angiographically apparent CAD.   Continue medical therapy.   Restart heparin gtt 8 hours after sheath pull for  AFib/stroke prevention.  Echo 09/14/2016 The left ventricular size is normal. Left ventricular wall thickness if normal  Overall left ventricular systolic function is normal with an EF between 60-65% The diastolic filling pattern indicates impaired relaxation The left atrium is mildly dilated Mild mitral regurgitation is present Mild tricuspid regurgitation present Past Medical History:  Diagnosis Date   Allergy    Atrial fibrillation (HCC)    Atrial fibrillation with rapid ventricular response (HCC) 04/10/2017   Bladder tumor 08/06/2022   BMI 28.0-28.9,adult 05/21/2022   Bronchitis 03/07/2022   CHF (congestive heart failure) (HCC)    Chronic anticoagulation 06/10/2019   Eliquis   Chronic diastolic heart failure (HCC)    Chronic obstructive pulmonary disease (HCC) 05/21/2022   Cigarette smoker 05/21/2022   CKD (chronic kidney disease) stage 3, GFR 30-59 ml/min (HCC)    COPD exacerbation (HCC) 04/08/2017   Demand ischemia 09/25/2016   Normal coronaries Jan 2019 when admitted with AF with RVR and chest pain   Dysrhythmia    a-fib   Elevated troponin    Essential hypertension    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 06/29/2022   GERD (gastroesophageal reflux disease)    HCAP (healthcare-associated pneumonia) 04/10/2017   Heart murmur    History of bladder cancer    Hypercholesterolemia 05/21/2022   Iron deficiency anemia due to  chronic blood loss 06/29/2022   Lung nodule    Muscle strain 08/16/2022   Myocardial infarction [facility])    ? 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/21/2022   Renal insufficiency  06/10/2019   New- medications adjusted by her PCP   Seasonal allergies    Sleep apnea    cpap  setting at 2.5 per patient    Stage 3a chronic kidney disease (HCC) 05/21/2022   Symptomatic anemia 08/07/2022   Tobacco abuse     Past Surgical History:  Procedure Laterality Date   ABDOMINAL HYSTERECTOMY     BLADDER SURGERY     COLONOSCOPY N/A 12/28/2016   Procedure: COLONOSCOPY;  Surgeon: Sherrilyn Rist, MD;  Location: [facility];  Service: Gastroenterology;  Laterality: N/A;   LEFT HEART CATH AND CORONARY ANGIOGRAPHY N/A 04/09/2017   Procedure: LEFT HEART CATH AND CORONARY ANGIOGRAPHY;  Surgeon: Corky Crafts, MD;  Location: [facility] INVASIVE CV LAB;  Service: Cardiovascular;  Laterality: N/A;   MOUTH SURGERY     POLYPECTOMY N/A 12/28/2016   Procedure: POLYPECTOMY with injection of Elevue;  Surgeon: Sherrilyn Rist, MD;  [facility];  Service: Gastroenterology;  Laterality: N/A;   TRANSURETHRAL RESECTION OF BLADDER TUMOR N/A 08/09/2022   Procedure: TRANSURETHRAL RESECTION OF BLADDER TUMOR (TURBT) BILATERAL RETROGRADES;  Surgeon: Loletta Parish., MD;  Location[facility];  Service: Urology;  Laterality: N/A;    MEDICATIONS:  acetaminophen (TYLENOL) 500 MG tablet   amoxicillin-clavulanate (AUGMENTIN) 875-125 MG tablet   apixaban (ELIQUIS) 5 MG TABS tablet   docusate sodium (COLACE) 100 MG capsule   Ferric Maltol 30 MG CAPS   flecainide (TAMBOCOR) 50 MG tablet   fluticasone (FLONASE) 50 MCG/ACT nasal spray   levalbuterol (XOPENEX HFA) 45 MCG/ACT inhaler   metoprolol succinate (TOPROL-XL) 25 MG 24 hr tablet   montelukast (SINGULAIR) 10 MG tablet   pantoprazole (PROTONIX) 40 MG tablet   pregabalin (LYRICA) 50 MG capsule   Psyllium (VEGETABLE LAXATIVE PO)   rosuvastatin (CRESTOR) 5 MG tablet   SYMBICORT 160-4.5 MCG/ACT inhaler   tiZANidine (ZANAFLEX) 2 MG tablet   valsartan (DIOVAN) 160 MG tablet    ipratropium-albuterol (DUONEB) 0.5-2.5 (3) MG/3ML nebulizer  solution 3 mL    [facility], PA-C [facility]Surgical Testing [phone]

## 2022-10-05 ENCOUNTER — Ambulatory Visit (HOSPITAL_BASED_OUTPATIENT_CLINIC_OR_DEPARTMENT_OTHER): Payer: Medicare Other | Admitting: Anesthesiology

## 2022-10-05 ENCOUNTER — Encounter (HOSPITAL_COMMUNITY)
Admission: RE | Disposition: A | Discharge status: Home / Self Care | Payer: Self-pay | Source: Home / Self Care | Attending: Urology

## 2022-10-05 ENCOUNTER — Ambulatory Visit (HOSPITAL_COMMUNITY): Payer: Medicare Other

## 2022-10-05 ENCOUNTER — Encounter (HOSPITAL_COMMUNITY): Payer: Self-pay | Admitting: Urology

## 2022-10-05 ENCOUNTER — Ambulatory Visit (HOSPITAL_COMMUNITY)
Admission: RE | Admit: 2022-10-05 | Discharge: 2022-10-05 | Disposition: A | Discharge status: Home / Self Care | Payer: Medicare Other | Attending: Urology | Admitting: Urology

## 2022-10-05 ENCOUNTER — Other Ambulatory Visit: Payer: Self-pay

## 2022-10-05 ENCOUNTER — Ambulatory Visit (HOSPITAL_COMMUNITY): Payer: Medicare Other | Admitting: Physician Assistant

## 2022-10-05 DIAGNOSIS — I130 Hypertensive heart and chronic kidney disease with heart failure and stage 1 through stage 4 chronic kidney disease, or unspecified chronic kidney disease: Secondary | ICD-10-CM | POA: Insufficient documentation

## 2022-10-05 DIAGNOSIS — I252 Old myocardial infarction: Secondary | ICD-10-CM | POA: Diagnosis not present

## 2022-10-05 DIAGNOSIS — K219 Gastro-esophageal reflux disease without esophagitis: Secondary | ICD-10-CM | POA: Insufficient documentation

## 2022-10-05 DIAGNOSIS — J449 Chronic obstructive pulmonary disease, unspecified: Secondary | ICD-10-CM | POA: Diagnosis not present

## 2022-10-05 DIAGNOSIS — N1831 Chronic kidney disease, stage 3a: Secondary | ICD-10-CM | POA: Insufficient documentation

## 2022-10-05 DIAGNOSIS — I480 Paroxysmal atrial fibrillation: Secondary | ICD-10-CM | POA: Insufficient documentation

## 2022-10-05 DIAGNOSIS — G709 Myoneural disorder, unspecified: Secondary | ICD-10-CM | POA: Insufficient documentation

## 2022-10-05 DIAGNOSIS — D3502 Benign neoplasm of left adrenal gland: Secondary | ICD-10-CM | POA: Insufficient documentation

## 2022-10-05 DIAGNOSIS — E7800 Pure hypercholesterolemia, unspecified: Secondary | ICD-10-CM | POA: Insufficient documentation

## 2022-10-05 DIAGNOSIS — G4733 Obstructive sleep apnea (adult) (pediatric): Secondary | ICD-10-CM | POA: Diagnosis not present

## 2022-10-05 DIAGNOSIS — C676 Malignant neoplasm of ureteric orifice: Secondary | ICD-10-CM | POA: Insufficient documentation

## 2022-10-05 DIAGNOSIS — D090 Carcinoma in situ of bladder: Secondary | ICD-10-CM | POA: Diagnosis not present

## 2022-10-05 DIAGNOSIS — N3289 Other specified disorders of bladder: Secondary | ICD-10-CM | POA: Diagnosis not present

## 2022-10-05 DIAGNOSIS — I5032 Chronic diastolic (congestive) heart failure: Secondary | ICD-10-CM | POA: Insufficient documentation

## 2022-10-05 DIAGNOSIS — C679 Malignant neoplasm of bladder, unspecified: Secondary | ICD-10-CM

## 2022-10-05 DIAGNOSIS — N183 Chronic kidney disease, stage 3 unspecified: Secondary | ICD-10-CM

## 2022-10-05 DIAGNOSIS — F17210 Nicotine dependence, cigarettes, uncomplicated: Secondary | ICD-10-CM | POA: Insufficient documentation

## 2022-10-05 DIAGNOSIS — Z7901 Long term (current) use of anticoagulants: Secondary | ICD-10-CM | POA: Diagnosis not present

## 2022-10-05 DIAGNOSIS — M1990 Unspecified osteoarthritis, unspecified site: Secondary | ICD-10-CM | POA: Diagnosis not present

## 2022-10-05 DIAGNOSIS — C678 Malignant neoplasm of overlapping sites of bladder: Secondary | ICD-10-CM | POA: Diagnosis not present

## 2022-10-05 HISTORY — PX: TRANSURETHRAL RESECTION OF BLADDER TUMOR: SHX2575

## 2022-10-05 SURGERY — TURBT (TRANSURETHRAL RESECTION OF BLADDER TUMOR)
Anesthesia: General | Site: Bladder

## 2022-10-05 MED ORDER — OXYCODONE HCL 5 MG/5ML PO SOLN: 5.0000 mg | Freq: Once | ORAL | Status: DC | PRN

## 2022-10-05 MED ORDER — IOHEXOL 300 MG/ML  SOLN
INTRAMUSCULAR | Status: DC | PRN
  Administered 2022-10-05: 15 mL

## 2022-10-05 MED ORDER — MIDAZOLAM HCL 2 MG/2ML IJ SOLN
INTRAMUSCULAR | Status: AC
  Filled 2022-10-05: qty 2

## 2022-10-05 MED ORDER — PHENYLEPHRINE HCL (PRESSORS) 10 MG/ML IV SOLN
INTRAVENOUS | Status: DC | PRN
  Administered 2022-10-05: 200 ug via INTRAVENOUS

## 2022-10-05 MED ORDER — FENTANYL CITRATE (PF) 100 MCG/2ML IJ SOLN
INTRAMUSCULAR | Status: DC | PRN
  Administered 2022-10-05 (×2): 50 ug via INTRAVENOUS

## 2022-10-05 MED ORDER — ACETAMINOPHEN 10 MG/ML IV SOLN: 1000.0000 mg | Freq: Once | INTRAVENOUS | Status: DC | PRN

## 2022-10-05 MED ORDER — OXYCODONE HCL 5 MG PO TABS: 5.0000 mg | ORAL_TABLET | Freq: Once | ORAL | Status: DC | PRN

## 2022-10-05 MED ORDER — PROPOFOL 10 MG/ML IV BOLUS
INTRAVENOUS | Status: DC | PRN
Start: 2022-10-05 — End: 2022-10-05
  Administered 2022-10-05: 100 mg via INTRAVENOUS

## 2022-10-05 MED ORDER — SODIUM CHLORIDE 0.9 % IR SOLN
Status: DC | PRN
  Administered 2022-10-05: 3000 mL

## 2022-10-05 MED ORDER — ORAL CARE MOUTH RINSE: 15.0000 mL | Freq: Once | OROMUCOSAL | Status: AC

## 2022-10-05 MED ORDER — PROPOFOL 10 MG/ML IV BOLUS
INTRAVENOUS | Status: AC
  Filled 2022-10-05: qty 20

## 2022-10-05 MED ORDER — ACETAMINOPHEN 160 MG/5ML PO SOLN: 325.0000 mg | ORAL | Status: DC | PRN

## 2022-10-05 MED ORDER — ACETAMINOPHEN 500 MG PO TABS
1000.0000 mg | ORAL_TABLET | Freq: Once | ORAL | Status: AC
  Administered 2022-10-05: 1000 mg via ORAL
  Filled 2022-10-05: qty 2

## 2022-10-05 MED ORDER — ONDANSETRON HCL 4 MG/2ML IJ SOLN
INTRAMUSCULAR | Status: DC | PRN
  Administered 2022-10-05: 4 mg via INTRAVENOUS

## 2022-10-05 MED ORDER — EPHEDRINE SULFATE (PRESSORS) 50 MG/ML IJ SOLN
INTRAMUSCULAR | Status: DC | PRN
  Administered 2022-10-05 (×2): 5 mg via INTRAVENOUS

## 2022-10-05 MED ORDER — DEXAMETHASONE SODIUM PHOSPHATE 10 MG/ML IJ SOLN
INTRAMUSCULAR | Status: DC | PRN
  Administered 2022-10-05: 4 mg via INTRAVENOUS

## 2022-10-05 MED ORDER — AMISULPRIDE (ANTIEMETIC) 5 MG/2ML IV SOLN: 10.0000 mg | Freq: Once | INTRAVENOUS | Status: DC | PRN

## 2022-10-05 MED ORDER — FENTANYL CITRATE (PF) 100 MCG/2ML IJ SOLN
INTRAMUSCULAR | Status: AC
  Filled 2022-10-05: qty 2

## 2022-10-05 MED ORDER — LACTATED RINGERS IV SOLN: INTRAVENOUS | Status: DC

## 2022-10-05 MED ORDER — TRAMADOL HCL 50 MG PO TABS: 50.0000 mg | ORAL_TABLET | Freq: Four times a day (QID) | ORAL | 0 refills | Status: DC | PRN

## 2022-10-05 MED ORDER — ACETAMINOPHEN 325 MG PO TABS: 325.0000 mg | ORAL_TABLET | ORAL | Status: DC | PRN

## 2022-10-05 MED ORDER — FENTANYL CITRATE PF 50 MCG/ML IJ SOSY: 25.0000 ug | PREFILLED_SYRINGE | INTRAMUSCULAR | Status: DC | PRN

## 2022-10-05 MED ORDER — PROMETHAZINE HCL 25 MG/ML IJ SOLN: 6.2500 mg | INTRAMUSCULAR | Status: DC | PRN

## 2022-10-05 MED ORDER — LIDOCAINE HCL (CARDIAC) PF 100 MG/5ML IV SOSY
PREFILLED_SYRINGE | INTRAVENOUS | Status: DC | PRN
  Administered 2022-10-05: 40 mg via INTRAVENOUS

## 2022-10-05 MED ORDER — 0.9 % SODIUM CHLORIDE (POUR BTL) OPTIME
TOPICAL | Status: DC | PRN
  Administered 2022-10-05: 1000 mL

## 2022-10-05 MED ORDER — GENTAMICIN SULFATE 40 MG/ML IJ SOLN
5.0000 mg/kg | INTRAVENOUS | Status: AC
  Administered 2022-10-05: 327.6 mg via INTRAVENOUS
  Filled 2022-10-05: qty 8.25

## 2022-10-05 MED ORDER — CHLORHEXIDINE GLUCONATE 0.12 % MT SOLN
15.0000 mL | Freq: Once | OROMUCOSAL | Status: AC
  Administered 2022-10-05: 15 mL via OROMUCOSAL

## 2022-10-05 SURGICAL SUPPLY — 18 items
BAG DRN RND TRDRP ANRFLXCHMBR (UROLOGICAL SUPPLIES)
BAG URINE DRAIN 2000ML AR STRL (UROLOGICAL SUPPLIES) IMPLANT
BAG URO CATCHER STRL LF (MISCELLANEOUS) ×2 IMPLANT
CATH URETL OPEN END 6FR 70 (CATHETERS) IMPLANT
DRAPE FOOT SWITCH (DRAPES) ×2 IMPLANT
ELECT REM PT RETURN 15FT ADLT (MISCELLANEOUS) ×2 IMPLANT
EVACUATOR MICROVAS BLADDER (UROLOGICAL SUPPLIES) IMPLANT
GLOVE SURG LX STRL 7.5 STRW (GLOVE) ×2 IMPLANT
GOWN STRL REUS W/ TWL XL LVL3 (GOWN DISPOSABLE) ×2 IMPLANT
GOWN STRL REUS W/TWL XL LVL3 (GOWN DISPOSABLE) ×1
KIT TURNOVER KIT A (KITS) IMPLANT
LOOP CUT BIPOLAR 24F LRG (ELECTROSURGICAL) IMPLANT
MANIFOLD NEPTUNE II (INSTRUMENTS) ×2 IMPLANT
PACK CYSTO (CUSTOM PROCEDURE TRAY) ×2 IMPLANT
SYR TOOMEY IRRIG 70ML (MISCELLANEOUS)
SYRINGE TOOMEY IRRIG 70ML (MISCELLANEOUS) IMPLANT
TUBING CONNECTING 10 (TUBING) ×2 IMPLANT
TUBING UROLOGY SET (TUBING) ×2 IMPLANT

## 2022-10-05 NOTE — Discharge Instructions (Signed)
1 - You may have urinary urgency (bladder spasms) and bloody urine on / off for up to 2 weeks. This is normal. ° °2 - Call MD or go to ER for fever >102, severe pain / nausea / vomiting not relieved by medications, or acute change in medical status ° °

## 2022-10-05 NOTE — Op Note (Signed)
NAME: Dana Horton, GREANEY MEDICAL RECORD NO: [id number] ACCOUNT NO: [id number] DATE OF BIRTH: [birth year] FACILITY: [facility] LOCATION: [facility] PHYSICIAN: Sebastian Ache, MD  Operative Report   DATE OF PROCEDURE: 10/05/2022  PREOPERATIVE DIAGNOSIS:  History of stage I, bladder cancer.  PROCEDURE: 1.  Restaging transurethral resection of bladder tumor, volume medium. 2.  Bilateral retrograde pyelograms interpretation.  ESTIMATED BLOOD LOSS:  Nil.  COMPLICATIONS:  None.  SPECIMENS:   1.  Old resection site. 2.  Basal resection set aside for permanent pathology.  FINDINGS:   1.  Approximately 3 cm2 area of old resection site.  No evidence of residual or grossly recurrent papillary tumor. 2.  Unremarkable bilateral retrograde pyelograms.  INDICATIONS:  The patient is a pleasant 77 year old lady with history of remote bladder cancer who developed a recurrence recently on evaluation of hematuria.  She underwent transurethral resection recently, which revealed stage I high-grade disease, but  fortunately, no muscle invasion.  Options were discussed including recommended path of restaging then possibly reinduction BCG following this and she wished to proceed.  Informed consent was obtained and placed in medical record.  PROCEDURE IN DETAIL:  The patient being identified and verified, procedure being transurethral resection of bladder tumor with retrogrades was confirmed.  Procedure timeout was performed.  Intravenous antibiotics were administered.  General LMA  anesthesia induced.  The patient was placed into a low lithotomy position.  Sterile field was created, prepped and draped the patient's vagina, introitus, and proximal thighs. Cystourethroscopy was performed using 21-French rigid cystoscope with offset  lens.  Inspection of urinary bladder revealed no diverticula, calcifications or papillary lesions.  There was an old resection site lateral to the right ureteral orifice with some  fibrinous tissue on this.  Total surface area approximately 3 cm.   Attention was directed at retrograde pyelograms.  The left ureteral orifice was cannulated with 6-French end-hole catheter and a left retrograde pyelogram was obtained.  Left retrograde pyelogram demonstrated a single left ureter, single system left kidney.  No filling defects or narrowing noted.  Next, right retrograde pyelogram was obtained.  Right retrograde pyelogram demonstrated single right ureter, single system right kidney.  No filling defects or narrowing noted.  Cystoscope was then exchanged to a 26-French resectoscope sheath with visual obturator and using resectoscope loop, very  careful resection was performed down to superficial fibromuscular stroma of the urinary bladder, encompassing the old resection site, again total surface area approximately 3 cm2.  These tissue fragments were irrigated and set aside labeled as old  resection site.  Next, cold cup biopsy forceps were used to obtain representative deep seromuscular samples.  These were set aside labeled as basal resection site. Loop was then used to cauterize the base and edges of this.  Hemostasis was excellent.  No  evidence of any obvious perforation.  Ureteral orifices remained patent bilaterally.  It was not felt that catheterization would be needed.  Bladder was emptied per cystoscope.  Procedure was then terminated.  The patient tolerated procedure well, no  immediate periprocedural complications.  The patient was taken to postanesthesia care unit in stable condition.  Plan for discharge home.   PUS D: 10/05/2022 11:44:15 am T: 10/05/2022 12:04:00 pm  JOB: [id number]/ [id number]

## 2022-10-05 NOTE — Brief Op Note (Signed)
10/05/2022  11:39 AM  PATIENT:  Dana Horton  77 y.o. female  PRE-OPERATIVE DIAGNOSIS:  BLADDER CANCER  POST-OPERATIVE DIAGNOSIS:  BLADDER CANCER  PROCEDURE:  Procedure(s) with comments: RESTAGING TRANSURETHRAL RESECTION OF BLADDER TUMOR (TURBT), CYSTOSCOPY, BILATERAL RETROGRADE PYELOGRAM (N/A) - 1 HR  SURGEON:  Surgeons and Role:    * Madelina Sanda, Delbert Phenix., MD - Primary  PHYSICIAN ASSISTANT:   ASSISTANTS: none   ANESTHESIA:  general  EBL:  minimal   BLOOD ADMINISTERED:none  DRAINS: none   LOCAL MEDICATIONS USED:  NONE  SPECIMEN:  Source of Specimen:  1 - old resection site; 2- base of old resection site  DISPOSITION OF SPECIMEN:  PATHOLOGY  COUNTS:  YES  TOURNIQUET:  * No tourniquets in log *  DICTATION: .Other Dictation: Dictation Number [id number]  PLAN OF CARE: Discharge to home after PACU  PATIENT DISPOSITION:  PACU - hemodynamically stable.   Delay start of Pharmacological VTE agent (>24hrs) due to surgical blood loss or risk of bleeding: yes

## 2022-10-05 NOTE — Transfer of Care (Signed)
Immediate Anesthesia Transfer of Care Note  Patient: Taresa Sherbert Nham  Procedure(s) Performed: RESTAGING TRANSURETHRAL RESECTION OF BLADDER TUMOR (TURBT), CYSTOSCOPY, BILATERAL RETROGRADE PYELOGRAM (Bladder)  Patient Location: PACU  Anesthesia Type:General  Level of Consciousness: awake, alert , oriented, and patient cooperative  Airway & Oxygen Therapy: Patient Spontanous Breathing and Patient connected to face mask oxygen  Post-op Assessment: Report given to RN, Post -op Vital signs reviewed and stable, and Patient moving all extremities X 4  Post vital signs: Reviewed and stable  Last Vitals:  Vitals Value Taken Time  BP 107/47   Temp    Pulse 77 10/05/22 1148  Resp 18 10/05/22 1148  SpO2 98 % 10/05/22 1148  Vitals shown include unfiled device data.  Last Pain:  Vitals:   10/05/22 0929  TempSrc:   PainSc: 2          Complications: No notable events documented.

## 2022-10-05 NOTE — Anesthesia Postprocedure Evaluation (Signed)
Anesthesia Post Note  Patient: Dana Horton  Procedure(s) Performed: RESTAGING TRANSURETHRAL RESECTION OF BLADDER TUMOR (TURBT), CYSTOSCOPY, BILATERAL RETROGRADE PYELOGRAM (Bladder)     Patient location during evaluation: PACU Anesthesia Type: General Level of consciousness: awake and alert Pain management: pain level controlled Vital Signs Assessment: post-procedure vital signs reviewed and stable Respiratory status: spontaneous breathing, nonlabored ventilation, respiratory function stable and patient connected to nasal cannula oxygen Cardiovascular status: blood pressure returned to baseline and stable Postop Assessment: no apparent nausea or vomiting Anesthetic complications: no  No notable events documented.  Last Vitals:  Vitals:   10/05/22 1215 10/05/22 1234  BP: (!) 106/42 (!) 106/46  Pulse: 73 70  Resp: 16 18  Temp:  36.5 C  SpO2: 93% 94%    Last Pain:  Vitals:   10/05/22 1234  TempSrc: Oral  PainSc: 2                  Shelton Silvas

## 2022-10-05 NOTE — H&P (Signed)
Dana Horton is an 77 y.o. female.    Chief Complaint: Pre-OP Restaging Transurethral Resection of Bladder Tumor / Bilateral Retrogrades  HPI:    1- Recurrent Bladder Cancer - s/p TURBT 2017 by MD that has retired (unknown grade/stage).  07/2022 - T1G3 with negative muscle, CT localized.   2 - Bilateral Renal Cysts / Hyperdense Cysts - L>R scattered renal cysts, some hyperdense on CT 07/2022. No enhancment on CT 05/2022.   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 NO h/o refracotory HTN or hypokalemia.   PMH sig for AFib/Eliquus (no h/o CVA, has come off for procedures), Benign Hyst, Cystocele repair, COPD with PRN O2 (still smokes some). LIves independatnly with husband who has very severe COPD, daughter Dana Horton (works [employer] peri-op) lives close.   Today "Dana Horton" is seen to proceed with restaging TURBT. No interval fevers. She has held Eliquus as directed.    Past Medical History:  Diagnosis Date   Allergy    Atrial fibrillation (HCC)    Atrial fibrillation with rapid ventricular response (HCC) 04/10/2017   Bladder tumor 08/06/2022   BMI 28.0-28.9,adult 05/21/2022   Bronchitis 03/07/2022   CHF (congestive heart failure) (HCC)    Chronic anticoagulation 06/10/2019   Eliquis   Chronic diastolic heart failure (HCC)    Chronic obstructive pulmonary disease (HCC) 05/21/2022   Cigarette smoker 05/21/2022   CKD (chronic kidney disease) stage 3, GFR 30-59 ml/min (HCC)    COPD exacerbation (HCC) 04/08/2017   Demand ischemia 09/25/2016   Normal coronaries Jan 2019 when admitted with AF with RVR and chest pain   Dysrhythmia    a-fib   Elevated troponin    Essential hypertension    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 06/29/2022   GERD (gastroesophageal reflux disease)    HCAP (healthcare-associated pneumonia) 04/10/2017   Heart murmur    History of bladder  cancer    Hypercholesterolemia 05/21/2022   Iron deficiency anemia due to chronic blood loss 06/29/2022   Lung nodule    Muscle strain 08/16/2022   Myocardial infarction [facility])    ? 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/21/2022   Renal insufficiency 06/10/2019   New- medications adjusted by her PCP   Seasonal allergies    Sleep apnea    cpap  setting at 2.5 per patient    Stage 3a chronic kidney disease (HCC) 05/21/2022   Symptomatic anemia 08/07/2022   Tobacco abuse     Past Surgical History:  Procedure Laterality Date   ABDOMINAL HYSTERECTOMY     BLADDER SURGERY     COLONOSCOPY N/A 12/28/2016   Procedure: COLONOSCOPY;  Surgeon: Sherrilyn Rist, MD;  Location[facility];  Service: Gastroenterology;  Laterality: N/A;   LEFT HEART CATH AND CORONARY ANGIOGRAPHY N/A 04/09/2017   Procedure: LEFT HEART CATH AND CORONARY ANGIOGRAPHY;  Surgeon: Corky Crafts, MD;  Location: [facility] INVASIVE CV LAB;  Service: Cardiovascular;  Laterality: N/A;   MOUTH SURGERY     POLYPECTOMY N/A 12/28/2016   Procedure: POLYPECTOMY with injection of Elevue;  Surgeon: Charlie Pitter III,  MD;  Location: [facility];  Service: Gastroenterology;  Laterality: N/A;   TRANSURETHRAL RESECTION OF BLADDER TUMOR N/A 08/09/2022   Procedure: TRANSURETHRAL RESECTION OF BLADDER TUMOR (TURBT) BILATERAL RETROGRADES;  Surgeon: Loletta Parish., MD;  Location: [facility];  Service: Urology;  Laterality: N/A;    Family History  Problem Relation Age of Onset   Hypertension Mother    Stroke Mother    Cancer Father    Heart disease Father    CAD Father    Arrhythmia Father    Bladder  Cancer Brother    Lung cancer Brother    Colon cancer Neg Hx    Esophageal cancer Neg Hx    Stomach cancer Neg Hx    Colon polyps Neg Hx    Social History:  reports that she has been smoking cigarettes. She has a 26 pack-year smoking history. She has never used smokeless tobacco. She reports that she does not drink alcohol and does not use drugs.  Allergies:  Allergies  Allergen Reactions   Albuterol Palpitations   Lisinopril Cough    No medications prior to admission.    No results found for this or any previous visit (from the past 48 hour(s)). No results found.  Review of Systems  Constitutional:  Negative for chills and fever.  All other systems reviewed and are negative.   There were no vitals taken for this visit. Physical Exam Vitals reviewed.  HENT:     Head: Normocephalic.  Eyes:     Pupils: Pupils are equal, round, and reactive to light.  Cardiovascular:     Rate and Rhythm: Normal rate.  Abdominal:     General: Abdomen is flat.  Genitourinary:    Comments: No CVAT at present Musculoskeletal:        General: Normal range of motion.     Cervical back: Normal range of motion.  Skin:    General: Skin is warm.  Neurological:     General: No focal deficit present.     Mental Status: She is alert.  Psychiatric:        Mood and Affect: Mood normal.      Assessment/Plan  Proceed as planned with restaging TURBT / retrogrades to verify no rapidly recurrent or residual bladder cancer. Risks, benefits, alternatives, expected peri-op course discussed previously and reiterated today.   Loletta Parish., MD 10/05/2022, 6:54 AM

## 2022-10-05 NOTE — Anesthesia Preprocedure Evaluation (Addendum)
Anesthesia Evaluation  Patient identified by MRN, date of birth, ID band Patient awake    Reviewed: Allergy & Precautions, NPO status , Patient's Chart, lab work & pertinent test results  Airway Mallampati: I  TM Distance: >3 FB Neck ROM: Full    Dental  (+) Upper Dentures, Lower Dentures   Pulmonary sleep apnea and Continuous Positive Airway Pressure Ventilation , COPD, Current Smoker   breath sounds clear to auscultation       Cardiovascular hypertension, Pt. on home beta blockers + Past MI and +CHF  + dysrhythmias Atrial Fibrillation  Rhythm:Regular Rate:Normal     Neuro/Psych  Neuromuscular disease  negative psych ROS   GI/Hepatic Neg liver ROS,GERD  Medicated,,  Endo/Other  negative endocrine ROS    Renal/GU Renal disease     Musculoskeletal  (+) Arthritis ,    Abdominal   Peds  Hematology negative hematology ROS (+)   Anesthesia Other Findings   Reproductive/Obstetrics                             Anesthesia Physical Anesthesia Plan  ASA: 3  Anesthesia Plan: General   Post-op Pain Management: Tylenol PO (pre-op)*   Induction: Intravenous  PONV Risk Score and Plan: 3 and Ondansetron and Treatment may vary due to age or medical condition  Airway Management Planned: LMA and Oral ETT  Additional Equipment: None  Intra-op Plan:   Post-operative Plan: Extubation in OR  Informed Consent: I have reviewed the patients History and Physical, chart, labs and discussed the procedure including the risks, benefits and alternatives for the proposed anesthesia with the patient or authorized representative who has indicated his/her understanding and acceptance.       Plan Discussed with: CRNA  Anesthesia Plan Comments:        Anesthesia Quick Evaluation

## 2022-10-05 NOTE — Anesthesia Procedure Notes (Signed)
Procedure Name: LMA Insertion Date/Time: 10/05/2022 11:18 AM  Performed by: Shanon Payor, CRNAPre-anesthesia Checklist: Patient identified, Emergency Drugs available, Suction available, Patient being monitored and Timeout performed Patient Re-evaluated:Patient Re-evaluated prior to induction Oxygen Delivery Method: Circle system utilized Preoxygenation: Pre-oxygenation with 100% oxygen Induction Type: IV induction LMA: LMA inserted LMA Size: 4.0 Number of attempts: 1 Placement Confirmation: positive ETCO2, CO2 detector and breath sounds checked- equal and bilateral Tube secured with: Tape Dental Injury: Teeth and Oropharynx as per pre-operative assessment

## 2022-10-08 ENCOUNTER — Other Ambulatory Visit: Payer: Self-pay

## 2022-10-08 ENCOUNTER — Encounter (HOSPITAL_COMMUNITY): Payer: Self-pay | Admitting: Urology

## 2022-10-08 LAB — SURGICAL PATHOLOGY

## 2022-10-08 MED ORDER — PANTOPRAZOLE SODIUM 40 MG PO TBEC: 40.0000 mg | DELAYED_RELEASE_TABLET | Freq: Every day | ORAL | 0 refills | Status: DC

## 2022-10-29 DIAGNOSIS — C678 Malignant neoplasm of overlapping sites of bladder: Secondary | ICD-10-CM | POA: Diagnosis not present

## 2022-10-29 DIAGNOSIS — R8271 Bacteriuria: Secondary | ICD-10-CM | POA: Diagnosis not present

## 2022-10-30 ENCOUNTER — Other Ambulatory Visit: Payer: Self-pay

## 2022-10-30 MED ORDER — FERRIC MALTOL 30 MG PO CAPS: 1.0000 | ORAL_CAPSULE | Freq: Every day | ORAL | 1 refills | Status: DC

## 2022-10-31 ENCOUNTER — Other Ambulatory Visit: Payer: Self-pay

## 2022-10-31 MED ORDER — FERRIC MALTOL 30 MG PO CAPS: 1.0000 | ORAL_CAPSULE | Freq: Every day | ORAL | 1 refills | Status: DC

## 2022-11-24 DIAGNOSIS — J324 Chronic pansinusitis: Secondary | ICD-10-CM | POA: Diagnosis not present

## 2022-11-24 DIAGNOSIS — R0981 Nasal congestion: Secondary | ICD-10-CM | POA: Diagnosis not present

## 2022-11-30 DIAGNOSIS — C678 Malignant neoplasm of overlapping sites of bladder: Secondary | ICD-10-CM | POA: Diagnosis not present

## 2022-11-30 DIAGNOSIS — Z5111 Encounter for antineoplastic chemotherapy: Secondary | ICD-10-CM | POA: Diagnosis not present

## 2022-12-03 ENCOUNTER — Encounter: Payer: Self-pay | Admitting: Internal Medicine

## 2022-12-03 ENCOUNTER — Ambulatory Visit: Payer: Medicare Other | Admitting: Internal Medicine

## 2022-12-03 VITALS — BP 118/76 | HR 80 | Temp 98.2°F | Resp 17 | Ht 63.0 in | Wt 132.8 lb

## 2022-12-03 DIAGNOSIS — N1831 Chronic kidney disease, stage 3a: Secondary | ICD-10-CM | POA: Diagnosis not present

## 2022-12-03 DIAGNOSIS — I10 Essential (primary) hypertension: Secondary | ICD-10-CM

## 2022-12-03 DIAGNOSIS — I480 Paroxysmal atrial fibrillation: Secondary | ICD-10-CM | POA: Diagnosis not present

## 2022-12-03 DIAGNOSIS — C679 Malignant neoplasm of bladder, unspecified: Secondary | ICD-10-CM | POA: Insufficient documentation

## 2022-12-03 DIAGNOSIS — D500 Iron deficiency anemia secondary to blood loss (chronic): Secondary | ICD-10-CM | POA: Diagnosis not present

## 2022-12-03 NOTE — Assessment & Plan Note (Signed)
Her A. Fib is currently controlled.  We will continue with flecainide and metoprolol.  She had a discussion with Dr. Dulce Sellar about possibly stopping her eliquis.  He is going to discuss this with her when he sees her back in Nov.

## 2022-12-03 NOTE — Assessment & Plan Note (Signed)
She is now off hydralazine and her BP is currently stable.  WE will continue on valsartan and metoprolol.

## 2022-12-03 NOTE — Assessment & Plan Note (Signed)
She will followup with Dr. Berneice Heinrich in Urology as directed.

## 2022-12-03 NOTE — Progress Notes (Signed)
Office Visit  Subjective   Patient ID: Dana Horton   DOB: 03-Feb-1946   Age: 77 y.o.   MRN: [id number]   Chief Complaint Chief Complaint  Patient presents with   Follow-up     History of Present Illness Dana Horton is a 77 yo female who returns today for followup of her anemia.  She was admitted to [facility] from 07/2022 for symptomatic anemia.   She was recently diagnosed at that time with recurrent bladder cancer.  She does have a PMHx of atrial fibrillation on Eliquis.  She presented to the ER with blood in the urine and flank pain on right and left side also having neck pain. She was having hematuria on and off over the prior month. She was recently admitted to [facility] with upper/lower endoscopy without any findings.  They did not transfuse at that time. She had an outpatient cystoscopy in [place] with Dr Saddie Benders who had concern for recurrent bladder tumor.  The patient was admitted as above with acute symptomatic anemia, likely blood loss complicated by iron deficiency in the setting of recurrent bladder tumor.  Patient had a TURBT on 08/09/2022 with urology which she tolerated well. Her discharge hemoglobin was 8.6  They wanted her to resume her eliquis on 5/20.  She remains on eliquis at this time and she denies any hematuria today.  The patient was diagnosed with bladder cancer in 2017 where they noted a mass on her bladder on CT scan. She underwent resection with TURBT and did not require chemo, radiation or BCG infusions. Urology has been following her for survelliance where in 12/2021, they did a cystoscopy which was normal.  However, she tells me she went back for a surveillance cystoscopy in 06/2022 and they found another bladder tumor.  Over the interim, she had another restaging TURBT done in 09/2022 and urology also started her on BCG infusions.  I did see her on 09/03/2022 where we noted her HgB was 8.4.  I did iron studies at that time and she had an iron level of  13 and a ferritin level of 139.  We set her up for a ferreheme infusion which she completed and placed her on oral iron.  Today, she says she had a trace of blood a few weeks ago but this has not recurred.  She denies any hemoptysis, BRBPR or melena.  She states she still has some generalized weakness, but no chest pain, SOB, DOE, fatigue or other problems.     She was also admitted to [facility] in 05/2022 due to GIB.  She presented to [facility] with melena that changed to BRBPR where she has a history significant for A. Fib on anticoagulation with eliquis.  RH did a CTA of her abd/pelvis which did not show any active bleed She was transferred to [facility] on the above date where her HgB on transfer was 8.7.  Her hemoglobin remained stable during her hospitalization with a discharge hemoglobin of 9.2 on 06/26/2022.  She did not need a blood transfusion during her hospital stay.  They did iron studies and this showed an iron of 24 and ferritin level of 44.  GI was consulted and they performed an EGD and colonoscopy on 06/26/2022.  Her EGD showed that the esophagus and stomach appeared normal.  There was one sessile polyp measuring smaller than 5 mm in the duodenal bulb which was biopsied.  There was some mild erythematous mucosa (duodenitis) in the duodenal bulb and  scalloped mucosa in the 1st part of the duodenum which was biopsied.  The 2nd part of the duodenum appeared normal.  They were continue to check H. Pylori and continued her pantoproazole and resume her eliquis.  Her colonoscopy showed no evidence of blood, no stigmata of recent bleeding, with a healthy appearing however there was extensive diverticulosis of severe severity and causing severe luminal narrowing with 3 prolapsing (grade 2) hemorrhoids and 11 subcentimeter polyps were removed.  I have reviewed her EPIC chart and I do not see her pathology reports back.  They recommend repeating a colonoscopy in 1 year.  They resumed her diet and she was  discharged home.  She was sent home on pantoproazole and they stopped her farxiga for her Stage III CKD due to "vaginal burning".  She states she stopped taking all her meds the night she went to the hospital.  She restarted her meds and when she took hydralazine 50mg  BID her SBP dropped to 97.  She states she got some weakness and SOB.  I asked her on 4/5 to stop the hydralazine and continue on metoprolol sucinate and valsartan.  She is checking her BP like 10 times per day and I asked her to stop this and check it once about 3 hours after taking her BP meds.  She can take her BP if she has symptoms.  Over the interim, she did see Dr. Dulce Sellar on 07/26/2022 for preoperative evaluation for her bladder tumor as described above.  She was having intermittent hypotension and weakness where he felt that we should hold on elective urologic surgery followed in this note and direct back to her PCP she may require hematology consultation and perhaps IV iron.  He adjustted her flecainide level for her A. Fib and decreased her valsartan to 160mg  daily.  They have placed an event monitor to monitor for bradycardia and this will be completed tomorrow per the patient.  Dr. Dulce Sellar wanted her to be seen by myself in regards to repeat blood counts/anemia.  I felt on her visit in 06/29/2022 that her GIB was possibly a combination of chronic blood loss from her diverticulosis or polyps.  She had some duodenitis and remained protonix.  Her discharge hemoglobin was 9.2 on 06/26/2022.  She normally has a HgB baseline of 13-14.  Dr. Dulce Sellar rechecked her HgB on 07/26/2022 and it was 8.    The patient is a 77 year old Caucasian/White female who presents for a follow-up evaluation of hypertension.  This past year, her BP was elevated and I increased her hydralazine from 25mg  BID to 50mg  BID.  They did stop her hydralazine in the hospital in 06/2022.  The patient has been checking her blood pressure at home.  She states her systolic blood pressure has  been running in the 100-120 range.  The patient's current medications include: metoprolol succinate 25 mg BID and valsartan 320mg  daily.  The patient has been tolerating her medications well. The patient denies any visual changes, dizziness, lightheadness, chest pain, shortness of breath, weakness/numbness, and edema. She uses Lasix as needed for leg swelling which she uses maybe a few days once or twice a month. This past year we noted that her diuretic was causing her kidney function to worsen and I asked her to stop her HCTZ. She reports there have been no other symptoms noted.   She also has Stage IIIa CKD which is probably related to her Hypertension.  We currently have her on valsartan and farxiga  for her kidney disease.  She avoids NSAIDS at this time.    The patient also has a history of paroxysmal Atrial Fib where I have referred her back to cardiology as she had not them since 2021.  She saw Dr. Dulce Sellar on 08/23/2022 where he felt her A. Fib was stable.   He wanted her to continue on flecainide and her anticoagulant.  Her A. Fib was initially diagnosed in 2018 where she came to see me on 08/2016 with chest pain where she was sent to [facility] and was admitted.   An ECHO was performed on 09/14/2016 showed a normal LVEF of 60-65% with impaired relaxation with mildly dilated LA and mild TR and MR.  She is currently on metoprolol with flecanide.  Her CHAD-VASC2 score is a 3.  In the hospital initially, she was started on an ASA but had hematochezia.  She was sent to GI where they did an EGD which was normal and a colonoscopy that showed polyps.  They ultimately felt that her bleeding was from an anal source and felt she could go on anticoagulation.   Today, she denies any chest pain, palpitations, SOB, swelling or other problems.  She denies any BRBPR, melena, bleeding, bruising or other problems from her Eliquis.   She is on eliquis 5mg  BID.     Past Medical History Past Medical History:  Diagnosis Date    Allergy    Atrial fibrillation (HCC)    Atrial fibrillation with rapid ventricular response (HCC) 04/10/2017   Bladder tumor 08/06/2022   BMI 28.0-28.9,adult 05/21/2022   Bronchitis 03/07/2022   CHF (congestive heart failure) (HCC)    Chronic anticoagulation 06/10/2019   Eliquis   Chronic diastolic heart failure (HCC)    Chronic obstructive pulmonary disease (HCC) 05/21/2022   Cigarette smoker 05/21/2022   CKD (chronic kidney disease) stage 3, GFR 30-59 ml/min (HCC)    COPD exacerbation (HCC) 04/08/2017   Demand ischemia 09/25/2016   Normal coronaries Jan 2019 when admitted with AF with RVR and chest pain   Dysrhythmia    a-fib   Elevated troponin    Essential hypertension    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 06/29/2022   GERD (gastroesophageal reflux disease)    HCAP (healthcare-associated pneumonia) 04/10/2017   Heart murmur    History of bladder cancer    Hypercholesterolemia 05/21/2022   Iron deficiency anemia due to chronic blood loss 06/29/2022   Lung nodule    Muscle strain 08/16/2022   Myocardial infarction [facility])    ? 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/21/2022   Renal insufficiency 06/10/2019   New- medications adjusted by her PCP   Seasonal allergies    Sleep apnea    cpap  setting at 2.5 per patient    Stage 3a chronic kidney disease (HCC) 05/21/2022   Symptomatic anemia 08/07/2022   Tobacco abuse      Allergies Allergies  Allergen Reactions   Albuterol Palpitations   Lisinopril Cough     Medications  Current Outpatient Medications:    acetaminophen (TYLENOL) 500 MG tablet, Take  500 mg by mouth every 6 (six) hours as needed for moderate pain or  headache., Disp: , Rfl:    apixaban (ELIQUIS) 5 MG TABS tablet, Take 1 tablet (5 mg total) by mouth 2 (two) times daily., Disp: 180 tablet, Rfl: 3   docusate sodium (COLACE) 100 MG capsule, Take 100 mg by mouth daily., Disp: , Rfl:    Ferric Maltol 30 MG CAPS, Take 1 capsule (30 mg total) by mouth daily., Disp: 90 capsule, Rfl: 1   flecainide (TAMBOCOR) 50 MG tablet, Take 1 tablet (50 mg total) by mouth 2 (two) times daily., Disp: 180 tablet, Rfl: 3   fluticasone (FLONASE) 50 MCG/ACT nasal spray, Place 2 sprays into both nostrils daily., Disp: 15.8 mL, Rfl: 0   levalbuterol (XOPENEX HFA) 45 MCG/ACT inhaler, Inhale 1-2 puffs into the lungs every 4 (four) hours as needed for wheezing. (Patient taking differently: Inhale 1-2 puffs into the lungs 2 (two) times daily.), Disp: 1 each, Rfl: 12   metoprolol succinate (TOPROL-XL) 25 MG 24 hr tablet, TAKE ONE TABLET BY MOUTH TWICE DAILY WITH OR immediately following A meal, Disp: 90 tablet, Rfl: 1   montelukast (SINGULAIR) 10 MG tablet, Take 1 tablet (10 mg total) by mouth every evening., Disp: 90 tablet, Rfl: 1   pantoprazole (PROTONIX) 40 MG tablet, Take 1 tablet (40 mg total) by mouth daily., Disp: 90 tablet, Rfl: 0   pregabalin (LYRICA) 50 MG capsule, Take 1 capsule (50 mg total) by mouth 2 (two) times daily., Disp: 60 capsule, Rfl: 0   Psyllium (VEGETABLE LAXATIVE PO), Take 1 tablet by mouth daily., Disp: , Rfl:    rosuvastatin (CRESTOR) 5 MG tablet, Take 5 mg by mouth every other day. In the morning, Disp: , Rfl:    SYMBICORT 160-4.5 MCG/ACT inhaler, Inhale 2 puffs into the lungs 2 (two) times daily., Disp: 1 each, Rfl: 3   valsartan (DIOVAN) 160 MG tablet, Take 1 tablet (160 mg total) by mouth daily., Disp: 90 tablet, Rfl: 3  Current Facility-Administered Medications:    ipratropium-albuterol (DUONEB) 0.5-2.5 (3) MG/3ML nebulizer solution 3 mL, 3 mL, Nebulization, Once, Crist Fat,  MD   Review of Systems Review of Systems  Constitutional:  Negative for chills and fever.  Eyes:  Negative for blurred vision and double vision.  Respiratory:  Negative for cough, hemoptysis and shortness of breath.   Cardiovascular:  Negative for chest pain, palpitations and leg swelling.  Gastrointestinal:  Negative for abdominal pain, blood in stool, constipation, diarrhea, melena, nausea and vomiting.  Genitourinary:  Negative for frequency and hematuria.  Musculoskeletal:  Negative for myalgias.  Skin:  Negative for itching and rash.  Neurological:  Negative for dizziness, weakness and headaches.  Endo/Heme/Allergies:  Negative for polydipsia.       Objective:    Vitals BP 118/76   Pulse 80   Temp 98.2 F (36.8 C)   Resp 17   Ht 5\' 3"  (1.6 m)   Wt 132 lb 12.8 oz (60.2 kg)   LMP  (LMP Unknown)   SpO2 97%   BMI 23.52 kg/m    Physical Examination Physical Exam Constitutional:      Appearance: Normal appearance. She is not ill-appearing.  Cardiovascular:     Rate and Rhythm: Normal rate and regular rhythm.     Pulses: Normal pulses.     Heart sounds: No murmur heard.    No friction rub. No gallop.  Pulmonary:     Effort: Pulmonary effort is normal. No respiratory distress.     Breath sounds: No wheezing, rhonchi or rales.  Abdominal:  General: Bowel sounds are normal. There is no distension.     Palpations: Abdomen is soft.     Tenderness: There is no abdominal tenderness.  Musculoskeletal:     Right lower leg: No edema.     Left lower leg: No edema.  Skin:    General: Skin is warm and dry.     Findings: No rash.  Neurological:     General: No focal deficit present.     Mental Status: She is alert and oriented to person, place, and time.  Psychiatric:        Mood and Affect: Mood normal.        Behavior: Behavior normal.        Assessment & Plan:   PAF (paroxysmal atrial fibrillation) (HCC) Her A. Fib is currently controlled.  We will continue  with flecainide and metoprolol.  She had a discussion with Dr. Dulce Sellar about possibly stopping her eliquis.  He is going to discuss this with her when he sees her back in Nov.  Essential hypertension She is now off hydralazine and her BP is currently stable.  WE will continue on valsartan and metoprolol.  Stage 3a chronic kidney disease (HCC) We noted in 04/2022 that she was having proteinuria with her CKD.  I started her back on farxiga at that time.  I want her to continue on avoidance of NSAIDS.  Malignant neoplasm of urinary bladder [facility]) She will followup with Dr. Berneice Heinrich in Urology as directed.  Iron deficiency anemia due to chronic blood loss She received ferraheme a few months ago and remains on oral iron.  We will recheck her iron studies and blood counts today.    Return in about 3 months (around 03/04/2023).   Crist Fat, MD

## 2022-12-03 NOTE — Assessment & Plan Note (Signed)
We noted in 04/2022 that she was having proteinuria with her CKD.  I started her back on farxiga at that time.  I want her to continue on avoidance of NSAIDS.

## 2022-12-03 NOTE — Assessment & Plan Note (Signed)
She received ferraheme a few months ago and remains on oral iron.  We will recheck her iron studies and blood counts today.

## 2022-12-04 LAB — CBC WITH DIFFERENTIAL/PLATELET
Basophils Absolute: 0 10*3/uL (ref 0.0–0.2)
Basos: 0 %
EOS (ABSOLUTE): 0 10*3/uL (ref 0.0–0.4)
Eos: 0 %
Hematocrit: 31.8 % — ABNORMAL LOW (ref 34.0–46.6)
Hemoglobin: 9.7 g/dL — ABNORMAL LOW (ref 11.1–15.9)
Immature Grans (Abs): 0 10*3/uL (ref 0.0–0.1)
Immature Granulocytes: 0 %
Lymphocytes Absolute: 1.6 10*3/uL (ref 0.7–3.1)
Lymphs: 17 %
MCH: 27.8 pg (ref 26.6–33.0)
MCHC: 30.5 g/dL — ABNORMAL LOW (ref 31.5–35.7)
MCV: 91 fL (ref 79–97)
Monocytes Absolute: 0.7 10*3/uL (ref 0.1–0.9)
Monocytes: 7 %
Neutrophils Absolute: 7.2 10*3/uL — ABNORMAL HIGH (ref 1.4–7.0)
Neutrophils: 76 %
Platelets: 143 10*3/uL — ABNORMAL LOW (ref 150–450)
RBC: 3.49 x10E6/uL — ABNORMAL LOW (ref 3.77–5.28)
RDW: 16.8 % — ABNORMAL HIGH (ref 11.7–15.4)
WBC: 9.6 10*3/uL (ref 3.4–10.8)

## 2022-12-04 LAB — FERRITIN: Ferritin: 424 ng/mL — ABNORMAL HIGH (ref 15–150)

## 2022-12-04 LAB — IRON: Iron: 19 ug/dL — ABNORMAL LOW (ref 27–139)

## 2022-12-05 ENCOUNTER — Other Ambulatory Visit: Payer: Self-pay

## 2022-12-05 DIAGNOSIS — D500 Iron deficiency anemia secondary to blood loss (chronic): Secondary | ICD-10-CM

## 2022-12-05 MED ORDER — FERRIC MALTOL 30 MG PO CAPS
1.0000 | ORAL_CAPSULE | Freq: Every day | ORAL | 1 refills | Status: AC
Start: 2022-12-05 — End: ?

## 2022-12-05 NOTE — Progress Notes (Signed)
RX Refill 

## 2022-12-05 NOTE — Progress Notes (Signed)
Continue on oral iron. She needs another round of ferraheme at [facility] hospital. Please arrange.   Patient aware of results, and feraheme order (faxed Today), Also sent refill for Iron med to pharmacy

## 2022-12-07 DIAGNOSIS — C678 Malignant neoplasm of overlapping sites of bladder: Secondary | ICD-10-CM | POA: Diagnosis not present

## 2022-12-07 DIAGNOSIS — Z5111 Encounter for antineoplastic chemotherapy: Secondary | ICD-10-CM | POA: Diagnosis not present

## 2022-12-12 DIAGNOSIS — D509 Iron deficiency anemia, unspecified: Secondary | ICD-10-CM | POA: Diagnosis not present

## 2022-12-14 DIAGNOSIS — C678 Malignant neoplasm of overlapping sites of bladder: Secondary | ICD-10-CM | POA: Diagnosis not present

## 2022-12-14 DIAGNOSIS — Z5111 Encounter for antineoplastic chemotherapy: Secondary | ICD-10-CM | POA: Diagnosis not present

## 2022-12-20 ENCOUNTER — Encounter: Payer: Self-pay | Admitting: Internal Medicine

## 2022-12-21 DIAGNOSIS — Z5111 Encounter for antineoplastic chemotherapy: Secondary | ICD-10-CM | POA: Diagnosis not present

## 2022-12-21 DIAGNOSIS — C678 Malignant neoplasm of overlapping sites of bladder: Secondary | ICD-10-CM | POA: Diagnosis not present

## 2022-12-24 ENCOUNTER — Other Ambulatory Visit: Payer: Self-pay

## 2022-12-24 ENCOUNTER — Encounter (HOSPITAL_COMMUNITY): Payer: Self-pay

## 2022-12-24 ENCOUNTER — Inpatient Hospital Stay (HOSPITAL_COMMUNITY)
Admission: EM | Admit: 2022-12-24 | Discharge: 2023-01-06 | DRG: 286 | Disposition: A | Discharge status: Home / Self Care | Payer: Medicare Other | Attending: Internal Medicine | Admitting: Internal Medicine

## 2022-12-24 ENCOUNTER — Emergency Department (HOSPITAL_COMMUNITY): Payer: Medicare Other

## 2022-12-24 ENCOUNTER — Ambulatory Visit: Payer: Medicare Other | Admitting: Internal Medicine

## 2022-12-24 ENCOUNTER — Encounter: Payer: Self-pay | Admitting: Internal Medicine

## 2022-12-24 VITALS — BP 110/60 | HR 167 | Temp 97.9°F | Resp 20 | Ht 63.0 in | Wt 129.6 lb

## 2022-12-24 DIAGNOSIS — R911 Solitary pulmonary nodule: Secondary | ICD-10-CM | POA: Diagnosis present

## 2022-12-24 DIAGNOSIS — I3139 Other pericardial effusion (noninflammatory): Secondary | ICD-10-CM | POA: Diagnosis present

## 2022-12-24 DIAGNOSIS — I959 Hypotension, unspecified: Secondary | ICD-10-CM | POA: Diagnosis not present

## 2022-12-24 DIAGNOSIS — I5033 Acute on chronic diastolic (congestive) heart failure: Secondary | ICD-10-CM | POA: Diagnosis not present

## 2022-12-24 DIAGNOSIS — I4891 Unspecified atrial fibrillation: Secondary | ICD-10-CM | POA: Diagnosis not present

## 2022-12-24 DIAGNOSIS — R0602 Shortness of breath: Secondary | ICD-10-CM | POA: Diagnosis not present

## 2022-12-24 DIAGNOSIS — J449 Chronic obstructive pulmonary disease, unspecified: Secondary | ICD-10-CM | POA: Diagnosis not present

## 2022-12-24 DIAGNOSIS — Z79899 Other long term (current) drug therapy: Secondary | ICD-10-CM

## 2022-12-24 DIAGNOSIS — I160 Hypertensive urgency: Secondary | ICD-10-CM | POA: Diagnosis present

## 2022-12-24 DIAGNOSIS — D649 Anemia, unspecified: Secondary | ICD-10-CM | POA: Diagnosis not present

## 2022-12-24 DIAGNOSIS — I2510 Atherosclerotic heart disease of native coronary artery without angina pectoris: Secondary | ICD-10-CM | POA: Diagnosis present

## 2022-12-24 DIAGNOSIS — I083 Combined rheumatic disorders of mitral, aortic and tricuspid valves: Secondary | ICD-10-CM | POA: Diagnosis present

## 2022-12-24 DIAGNOSIS — N1831 Chronic kidney disease, stage 3a: Secondary | ICD-10-CM | POA: Diagnosis present

## 2022-12-24 DIAGNOSIS — Z823 Family history of stroke: Secondary | ICD-10-CM

## 2022-12-24 DIAGNOSIS — Z8052 Family history of malignant neoplasm of bladder: Secondary | ICD-10-CM

## 2022-12-24 DIAGNOSIS — R918 Other nonspecific abnormal finding of lung field: Secondary | ICD-10-CM | POA: Diagnosis not present

## 2022-12-24 DIAGNOSIS — I471 Supraventricular tachycardia, unspecified: Secondary | ICD-10-CM | POA: Diagnosis not present

## 2022-12-24 DIAGNOSIS — Z8249 Family history of ischemic heart disease and other diseases of the circulatory system: Secondary | ICD-10-CM

## 2022-12-24 DIAGNOSIS — G4733 Obstructive sleep apnea (adult) (pediatric): Secondary | ICD-10-CM

## 2022-12-24 DIAGNOSIS — I351 Nonrheumatic aortic (valve) insufficiency: Secondary | ICD-10-CM

## 2022-12-24 DIAGNOSIS — G629 Polyneuropathy, unspecified: Secondary | ICD-10-CM | POA: Diagnosis not present

## 2022-12-24 DIAGNOSIS — J9601 Acute respiratory failure with hypoxia: Secondary | ICD-10-CM | POA: Diagnosis not present

## 2022-12-24 DIAGNOSIS — Z7901 Long term (current) use of anticoagulants: Secondary | ICD-10-CM

## 2022-12-24 DIAGNOSIS — I2729 Other secondary pulmonary hypertension: Secondary | ICD-10-CM | POA: Diagnosis present

## 2022-12-24 DIAGNOSIS — Z66 Do not resuscitate: Secondary | ICD-10-CM | POA: Diagnosis present

## 2022-12-24 DIAGNOSIS — J9600 Acute respiratory failure, unspecified whether with hypoxia or hypercapnia: Secondary | ICD-10-CM | POA: Diagnosis not present

## 2022-12-24 DIAGNOSIS — N179 Acute kidney failure, unspecified: Secondary | ICD-10-CM | POA: Diagnosis not present

## 2022-12-24 DIAGNOSIS — Z8551 Personal history of malignant neoplasm of bladder: Secondary | ICD-10-CM

## 2022-12-24 DIAGNOSIS — E7800 Pure hypercholesterolemia, unspecified: Secondary | ICD-10-CM | POA: Diagnosis present

## 2022-12-24 DIAGNOSIS — Z801 Family history of malignant neoplasm of trachea, bronchus and lung: Secondary | ICD-10-CM

## 2022-12-24 DIAGNOSIS — I739 Peripheral vascular disease, unspecified: Secondary | ICD-10-CM | POA: Diagnosis present

## 2022-12-24 DIAGNOSIS — J441 Chronic obstructive pulmonary disease with (acute) exacerbation: Secondary | ICD-10-CM | POA: Diagnosis not present

## 2022-12-24 DIAGNOSIS — M810 Age-related osteoporosis without current pathological fracture: Secondary | ICD-10-CM | POA: Diagnosis not present

## 2022-12-24 DIAGNOSIS — D509 Iron deficiency anemia, unspecified: Secondary | ICD-10-CM | POA: Diagnosis present

## 2022-12-24 DIAGNOSIS — I252 Old myocardial infarction: Secondary | ICD-10-CM

## 2022-12-24 DIAGNOSIS — I700 Atherosclerosis of aorta: Secondary | ICD-10-CM | POA: Diagnosis present

## 2022-12-24 DIAGNOSIS — I4892 Unspecified atrial flutter: Secondary | ICD-10-CM | POA: Diagnosis present

## 2022-12-24 DIAGNOSIS — R0603 Acute respiratory distress: Secondary | ICD-10-CM | POA: Diagnosis not present

## 2022-12-24 DIAGNOSIS — K219 Gastro-esophageal reflux disease without esophagitis: Secondary | ICD-10-CM | POA: Diagnosis not present

## 2022-12-24 DIAGNOSIS — R001 Bradycardia, unspecified: Secondary | ICD-10-CM | POA: Diagnosis not present

## 2022-12-24 DIAGNOSIS — I110 Hypertensive heart disease with heart failure: Principal | ICD-10-CM | POA: Diagnosis present

## 2022-12-24 DIAGNOSIS — I361 Nonrheumatic tricuspid (valve) insufficiency: Secondary | ICD-10-CM | POA: Diagnosis not present

## 2022-12-24 DIAGNOSIS — I480 Paroxysmal atrial fibrillation: Secondary | ICD-10-CM | POA: Diagnosis present

## 2022-12-24 DIAGNOSIS — C679 Malignant neoplasm of bladder, unspecified: Secondary | ICD-10-CM

## 2022-12-24 DIAGNOSIS — J90 Pleural effusion, not elsewhere classified: Secondary | ICD-10-CM | POA: Diagnosis not present

## 2022-12-24 DIAGNOSIS — J432 Centrilobular emphysema: Secondary | ICD-10-CM | POA: Diagnosis not present

## 2022-12-24 DIAGNOSIS — Z888 Allergy status to other drugs, medicaments and biological substances status: Secondary | ICD-10-CM

## 2022-12-24 DIAGNOSIS — I5031 Acute diastolic (congestive) heart failure: Secondary | ICD-10-CM | POA: Diagnosis not present

## 2022-12-24 DIAGNOSIS — Z7984 Long term (current) use of oral hypoglycemic drugs: Secondary | ICD-10-CM

## 2022-12-24 DIAGNOSIS — D500 Iron deficiency anemia secondary to blood loss (chronic): Secondary | ICD-10-CM

## 2022-12-24 DIAGNOSIS — J810 Acute pulmonary edema: Secondary | ICD-10-CM | POA: Diagnosis not present

## 2022-12-24 DIAGNOSIS — I340 Nonrheumatic mitral (valve) insufficiency: Secondary | ICD-10-CM | POA: Diagnosis not present

## 2022-12-24 DIAGNOSIS — I10 Essential (primary) hypertension: Secondary | ICD-10-CM | POA: Diagnosis not present

## 2022-12-24 DIAGNOSIS — F17210 Nicotine dependence, cigarettes, uncomplicated: Secondary | ICD-10-CM | POA: Diagnosis present

## 2022-12-24 DIAGNOSIS — E876 Hypokalemia: Secondary | ICD-10-CM | POA: Diagnosis present

## 2022-12-24 DIAGNOSIS — Z7951 Long term (current) use of inhaled steroids: Secondary | ICD-10-CM

## 2022-12-24 LAB — CBC WITH DIFFERENTIAL/PLATELET
Abs Immature Granulocytes: 0.04 10*3/uL (ref 0.00–0.07)
Basophils Absolute: 0 10*3/uL (ref 0.0–0.1)
Basophils Relative: 0 %
Eosinophils Absolute: 0 10*3/uL (ref 0.0–0.5)
Eosinophils Relative: 0 %
HCT: 27.8 % — ABNORMAL LOW (ref 36.0–46.0)
Hemoglobin: 8.4 g/dL — ABNORMAL LOW (ref 12.0–15.0)
Immature Granulocytes: 1 %
Lymphocytes Relative: 20 %
Lymphs Abs: 1.6 10*3/uL (ref 0.7–4.0)
MCH: 29.2 pg (ref 26.0–34.0)
MCHC: 30.2 g/dL (ref 30.0–36.0)
MCV: 96.5 fL (ref 80.0–100.0)
Monocytes Absolute: 0.5 10*3/uL (ref 0.1–1.0)
Monocytes Relative: 6 %
Neutro Abs: 5.8 10*3/uL (ref 1.7–7.7)
Neutrophils Relative %: 73 %
Platelets: 157 10*3/uL (ref 150–400)
RBC: 2.88 MIL/uL — ABNORMAL LOW (ref 3.87–5.11)
RDW: 19.9 % — ABNORMAL HIGH (ref 11.5–15.5)
WBC: 7.9 10*3/uL (ref 4.0–10.5)
nRBC: 0 % (ref 0.0–0.2)

## 2022-12-24 LAB — COMPREHENSIVE METABOLIC PANEL
ALT: 11 U/L (ref 0–44)
AST: 13 U/L — ABNORMAL LOW (ref 15–41)
Albumin: 2.9 g/dL — ABNORMAL LOW (ref 3.5–5.0)
Alkaline Phosphatase: 70 U/L (ref 38–126)
Anion gap: 9 (ref 5–15)
BUN: 17 mg/dL (ref 8–23)
CO2: 25 mmol/L (ref 22–32)
Calcium: 8.8 mg/dL — ABNORMAL LOW (ref 8.9–10.3)
Chloride: 105 mmol/L (ref 98–111)
Creatinine, Ser: 0.87 mg/dL (ref 0.44–1.00)
GFR, Estimated: >60 mL/min (ref >60)
Glucose, Bld: 98 mg/dL (ref 70–99)
Potassium: 3.5 mmol/L (ref 3.5–5.1)
Sodium: 139 mmol/L (ref 135–145)
Total Bilirubin: 0.7 mg/dL (ref 0.3–1.2)
Total Protein: 7 g/dL (ref 6.5–8.1)

## 2022-12-24 LAB — TROPONIN I (HIGH SENSITIVITY)
Troponin I (High Sensitivity): 11 ng/L (ref <18)
Troponin I (High Sensitivity): 13 ng/L (ref <18)

## 2022-12-24 LAB — MAGNESIUM: Magnesium: 1.9 mg/dL (ref 1.7–2.4)

## 2022-12-24 LAB — BRAIN NATRIURETIC PEPTIDE: B Natriuretic Peptide: 1180.3 pg/mL — ABNORMAL HIGH (ref 0.0–100.0)

## 2022-12-24 MED ORDER — PREGABALIN 25 MG PO CAPS
50.0000 mg | ORAL_CAPSULE | Freq: Two times a day (BID) | ORAL | Status: DC
  Administered 2022-12-25 – 2023-01-06 (×25): 50 mg via ORAL
  Filled 2022-12-24 (×26): qty 2

## 2022-12-24 MED ORDER — IRBESARTAN 150 MG PO TABS
150.0000 mg | ORAL_TABLET | Freq: Every day | ORAL | Status: DC
  Filled 2022-12-24: qty 1

## 2022-12-24 MED ORDER — SODIUM CHLORIDE 0.9 % IV BOLUS
500.0000 mL | Freq: Once | INTRAVENOUS | Status: AC
  Administered 2022-12-24: 500 mL via INTRAVENOUS

## 2022-12-24 MED ORDER — METOPROLOL TARTRATE 5 MG/5ML IV SOLN
5.0000 mg | INTRAVENOUS | Status: AC | PRN
  Administered 2022-12-24 (×3): 5 mg via INTRAVENOUS
  Filled 2022-12-24 (×3): qty 5

## 2022-12-24 MED ORDER — NITROGLYCERIN IN D5W 200-5 MCG/ML-% IV SOLN
INTRAVENOUS | Status: AC
  Filled 2022-12-24: qty 250

## 2022-12-24 MED ORDER — IPRATROPIUM-ALBUTEROL 0.5-2.5 (3) MG/3ML IN SOLN: 3.0000 mL | Freq: Once | RESPIRATORY_TRACT | Status: AC

## 2022-12-24 MED ORDER — FLECAINIDE ACETATE 50 MG PO TABS
50.0000 mg | ORAL_TABLET | Freq: Two times a day (BID) | ORAL | Status: DC
  Administered 2022-12-25 – 2022-12-26 (×3): 50 mg via ORAL
  Filled 2022-12-24 (×4): qty 1

## 2022-12-24 MED ORDER — NITROGLYCERIN 2 % TD OINT
1.0000 [in_us] | TOPICAL_OINTMENT | Freq: Once | TRANSDERMAL | Status: DC
  Filled 2022-12-24: qty 1

## 2022-12-24 MED ORDER — CHLORHEXIDINE GLUCONATE CLOTH 2 % EX PADS
6.0000 | MEDICATED_PAD | Freq: Every day | CUTANEOUS | Status: DC
  Administered 2022-12-24 – 2022-12-29 (×5): 6 via TOPICAL

## 2022-12-24 MED ORDER — DOCUSATE SODIUM 100 MG PO CAPS
100.0000 mg | ORAL_CAPSULE | Freq: Every day | ORAL | Status: DC
  Administered 2022-12-25 – 2023-01-06 (×13): 100 mg via ORAL
  Filled 2022-12-24 (×14): qty 1

## 2022-12-24 MED ORDER — LEVALBUTEROL HCL 0.63 MG/3ML IN NEBU
0.6300 mg | INHALATION_SOLUTION | Freq: Two times a day (BID) | RESPIRATORY_TRACT | Status: DC
  Administered 2022-12-25 – 2023-01-06 (×24): 0.63 mg via RESPIRATORY_TRACT
  Filled 2022-12-24 (×23): qty 3

## 2022-12-24 MED ORDER — LEVALBUTEROL TARTRATE 45 MCG/ACT IN AERO: 1.0000 | INHALATION_SPRAY | Freq: Two times a day (BID) | RESPIRATORY_TRACT | Status: DC

## 2022-12-24 MED ORDER — AMIODARONE LOAD VIA INFUSION
150.0000 mg | Freq: Once | INTRAVENOUS | Status: AC
  Administered 2022-12-24: 150 mg via INTRAVENOUS
  Filled 2022-12-24: qty 83.34

## 2022-12-24 MED ORDER — FLUTICASONE PROPIONATE 50 MCG/ACT NA SUSP
2.0000 | Freq: Every day | NASAL | Status: DC
  Administered 2022-12-25 – 2023-01-06 (×11): 2 via NASAL
  Filled 2022-12-24 (×2): qty 16

## 2022-12-24 MED ORDER — FERRIC MALTOL 30 MG PO CAPS: 1.0000 | ORAL_CAPSULE | Freq: Every day | ORAL | Status: DC

## 2022-12-24 MED ORDER — ONDANSETRON HCL 4 MG PO TABS: 4.0000 mg | ORAL_TABLET | Freq: Four times a day (QID) | ORAL | Status: DC | PRN

## 2022-12-24 MED ORDER — TRAZODONE HCL 50 MG PO TABS
25.0000 mg | ORAL_TABLET | Freq: Every evening | ORAL | Status: DC | PRN
  Administered 2022-12-27 – 2023-01-05 (×6): 25 mg via ORAL
  Filled 2022-12-24 (×7): qty 1

## 2022-12-24 MED ORDER — FERROUS SULFATE 325 (65 FE) MG PO TABS
325.0000 mg | ORAL_TABLET | Freq: Every day | ORAL | Status: DC
  Administered 2022-12-25 – 2023-01-06 (×13): 325 mg via ORAL
  Filled 2022-12-24 (×13): qty 1

## 2022-12-24 MED ORDER — IPRATROPIUM-ALBUTEROL 0.5-2.5 (3) MG/3ML IN SOLN
RESPIRATORY_TRACT | Status: AC
  Administered 2022-12-24: 3 mL via RESPIRATORY_TRACT
  Filled 2022-12-24: qty 3

## 2022-12-24 MED ORDER — FUROSEMIDE 10 MG/ML IJ SOLN: 40.0000 mg | Freq: Two times a day (BID) | INTRAMUSCULAR | Status: DC

## 2022-12-24 MED ORDER — ONDANSETRON HCL 4 MG/2ML IJ SOLN: 4.0000 mg | Freq: Four times a day (QID) | INTRAMUSCULAR | Status: DC | PRN

## 2022-12-24 MED ORDER — FUROSEMIDE 10 MG/ML IJ SOLN
INTRAMUSCULAR | Status: AC
  Administered 2022-12-24: 20 mg via INTRAVENOUS
  Filled 2022-12-24: qty 4

## 2022-12-24 MED ORDER — APIXABAN 5 MG PO TABS
5.0000 mg | ORAL_TABLET | Freq: Two times a day (BID) | ORAL | Status: DC
  Administered 2022-12-24 – 2023-01-01 (×16): 5 mg via ORAL
  Filled 2022-12-24 (×17): qty 1

## 2022-12-24 MED ORDER — AMIODARONE HCL IN DEXTROSE 360-4.14 MG/200ML-% IV SOLN
60.0000 mg/h | INTRAVENOUS | Status: AC
  Administered 2022-12-24 (×2): 60 mg/h via INTRAVENOUS
  Filled 2022-12-24 (×2): qty 200

## 2022-12-24 MED ORDER — MONTELUKAST SODIUM 10 MG PO TABS
10.0000 mg | ORAL_TABLET | Freq: Every evening | ORAL | Status: DC
  Administered 2022-12-24 – 2023-01-05 (×13): 10 mg via ORAL
  Filled 2022-12-24 (×13): qty 1

## 2022-12-24 MED ORDER — NITROGLYCERIN IN D5W 200-5 MCG/ML-% IV SOLN: 0.0000 ug/min | INTRAVENOUS | Status: DC

## 2022-12-24 MED ORDER — PANTOPRAZOLE SODIUM 40 MG PO TBEC
40.0000 mg | DELAYED_RELEASE_TABLET | Freq: Every day | ORAL | Status: DC
  Administered 2022-12-25 – 2023-01-06 (×13): 40 mg via ORAL
  Filled 2022-12-24 (×14): qty 1

## 2022-12-24 MED ORDER — MAGNESIUM HYDROXIDE 400 MG/5ML PO SUSP: 30.0000 mL | Freq: Every day | ORAL | Status: DC | PRN

## 2022-12-24 MED ORDER — ROSUVASTATIN CALCIUM 5 MG PO TABS
5.0000 mg | ORAL_TABLET | ORAL | Status: DC
  Administered 2022-12-25 – 2023-01-06 (×7): 5 mg via ORAL
  Filled 2022-12-24 (×8): qty 1

## 2022-12-24 MED ORDER — ACETAMINOPHEN 325 MG PO TABS: 650.0000 mg | ORAL_TABLET | Freq: Four times a day (QID) | ORAL | Status: DC | PRN

## 2022-12-24 MED ORDER — CHLORHEXIDINE GLUCONATE CLOTH 2 % EX PADS: 6.0000 | MEDICATED_PAD | Freq: Every day | CUTANEOUS | Status: DC

## 2022-12-24 MED ORDER — FERRIC MALTOL 30 MG PO CAPS
1.0000 | ORAL_CAPSULE | Freq: Every day | ORAL | 1 refills | Status: DC
Start: 2022-12-24 — End: 2023-02-09

## 2022-12-24 MED ORDER — METOPROLOL SUCCINATE ER 25 MG PO TB24
25.0000 mg | ORAL_TABLET | Freq: Every day | ORAL | Status: DC
  Filled 2022-12-24: qty 1

## 2022-12-24 MED ORDER — ACETAMINOPHEN 650 MG RE SUPP: 650.0000 mg | Freq: Four times a day (QID) | RECTAL | Status: DC | PRN

## 2022-12-24 MED ORDER — FUROSEMIDE 10 MG/ML IJ SOLN: 20.0000 mg | Freq: Once | INTRAMUSCULAR | Status: AC

## 2022-12-24 MED ORDER — AMIODARONE HCL IN DEXTROSE 360-4.14 MG/200ML-% IV SOLN: 30.0000 mg/h | INTRAVENOUS | Status: DC

## 2022-12-24 NOTE — ED Triage Notes (Signed)
Patient sent from her doctor for atrial fibrillation. Denies chest pain. Feels short of breath. Heart rate 140s in triage.

## 2022-12-24 NOTE — ED Provider Notes (Addendum)
[place] EMERGENCY DEPARTMENT AT [facility] Provider Note   CSN: [id number] Arrival date & time: 12/24/22  1640     History  Chief Complaint  Patient presents with   Tachycardia    Dana Horton is a 77 y.o. female with a past medical history of atrial fibrillation on Eliquis, heart failure and COPD presents to the ED for palpitations.  Patient first felt generalized weakness yesterday evening.  Her daughter who lives across the street came over to check on her and noted her to have low blood pressure but a heart rate in the 80s and 90s.  Because she had low blood pressure she did not take her regular medications today including metoprolol and Eliquis.  Palpitations started this afternoon and persisted since the onset.  She was scheduled to have an iron infusion but was noted to be in A-fib with RVR and sent here for further evaluation.  No chest pain, trouble breathing, fevers or chills.  She does note a recent productive cough and daughter voiced concern for dehydration and decreased p.o. intake.  Last dose of Eliquis was yesterday at noon  HPI     Home Medications Prior to Admission medications   Medication Sig Start Date End Date Taking? Authorizing Provider  acetaminophen (TYLENOL) 500 MG tablet Take 500 mg by mouth every 6 (six) hours as needed for moderate pain or headache.   Yes [provider]  apixaban (ELIQUIS) 5 MG TABS tablet Take 1 tablet (5 mg total) by mouth 2 (two) times daily. 08/12/22  Yes Azucena Fallen, MD  benzonatate (TESSALON) 100 MG capsule Take 200 mg by mouth 3 (three) times daily as needed. 11/24/22  Yes [provider]  docusate sodium (COLACE) 100 MG capsule Take 100 mg by mouth daily.   Yes [provider]  Ferric Maltol 30 MG CAPS Take 1 capsule (30 mg total) by mouth daily. 12/24/22  Yes Crist Fat, MD  flecainide (TAMBOCOR) 50 MG tablet Take 1 tablet (50 mg total) by mouth 2 (two) times  daily. 07/25/22  Yes Baldo Daub, MD  fluticasone (FLONASE) 50 MCG/ACT nasal spray Place 2 sprays into both nostrils daily. 03/07/22  Yes Crist Fat, MD  levalbuterol [facility] HFA) 45 MCG/ACT inhaler Inhale 1-2 puffs into the lungs every 4 (four) hours as needed for wheezing. Patient taking differently: Inhale 1-2 puffs into the lungs 2 (two) times daily. 05/21/22  Yes Crist Fat, MD  metoprolol succinate (TOPROL-XL) 25 MG 24 hr tablet TAKE ONE TABLET BY MOUTH TWICE DAILY WITH OR immediately following A meal 08/10/22  Yes Crist Fat, MD  montelukast (SINGULAIR) 10 MG tablet Take 1 tablet (10 mg total) by mouth every evening. 08/06/22  Yes Crist Fat, MD  pantoprazole (PROTONIX) 40 MG tablet Take 1 tablet (40 mg total) by mouth daily. 10/08/22  Yes Crist Fat, MD  Psyllium (VEGETABLE LAXATIVE PO) Take 1 tablet by mouth daily.   Yes [provider]  rosuvastatin (CRESTOR) 5 MG tablet Take 5 mg by mouth every other day. In the morning   Yes [provider]  SYMBICORT 160-4.5 MCG/ACT inhaler Inhale 2 puffs into the lungs 2 (two) times daily. 05/21/22  Yes Crist Fat, MD  valsartan (DIOVAN) 160 MG tablet Take 1 tablet (160 mg total) by mouth daily. 07/25/22  Yes Baldo Daub, MD  pregabalin (LYRICA) 50 MG capsule Take 1 capsule (50 mg total) by mouth 2 (two) times daily.  Patient not taking: Reported on 12/24/2022 08/16/22   Crist Fat, MD      Allergies    Albuterol and Lisinopril    Review of Systems   Review of Systems  Respiratory:  Positive for cough.   Cardiovascular:  Positive for palpitations.    Physical Exam Updated Vital Signs BP (!) 123/51   Pulse 87   Temp 97.9 F (36.6 C) (Oral)   Resp (!) 25   Ht 5\' 3"  (1.6 m)   Wt 58.5 kg   LMP  (LMP Unknown)   SpO2 100%   BMI 22.85 kg/m  Physical Exam Vitals and nursing note reviewed.  HENT:     Head: Normocephalic and atraumatic.  Eyes:     Pupils: Pupils are equal, round, and reactive to  light.  Cardiovascular:     Rate and Rhythm: Tachycardia present. Rhythm irregular.  Pulmonary:     Effort: Pulmonary effort is normal.     Comments: Crackling in bilateral bases left greater than right Abdominal:     Palpations: Abdomen is soft.     Tenderness: There is no abdominal tenderness.  Skin:    General: Skin is warm and dry.  Neurological:     Mental Status: She is alert.  Psychiatric:        Mood and Affect: Mood normal.     ED Results / Procedures / Treatments   Labs (all labs ordered are listed, but only abnormal results are displayed) Labs Reviewed  CBC WITH DIFFERENTIAL/PLATELET - Abnormal; Notable for the following components:      Result Value   RBC 2.88 (*)    Hemoglobin 8.4 (*)    HCT 27.8 (*)    RDW 19.9 (*)    All other components within normal limits  COMPREHENSIVE METABOLIC PANEL - Abnormal; Notable for the following components:   Calcium 8.8 (*)    Albumin 2.9 (*)    AST 13 (*)    All other components within normal limits  BRAIN NATRIURETIC PEPTIDE - Abnormal; Notable for the following components:   B Natriuretic Peptide 1,180.3 (*)    All other components within normal limits  MAGNESIUM  BASIC METABOLIC PANEL  CBC  TROPONIN I (HIGH SENSITIVITY)  TROPONIN I (HIGH SENSITIVITY)    EKG EKG Interpretation Date/Time:  Monday December 24 2022 20:40:41 EDT Ventricular Rate:  86 PR Interval:  195 QRS Duration:  94 QT Interval:  370 QTC Calculation: 443 R Axis:   76  Text Interpretation: Sinus rhythm Probable left atrial enlargement Left ventricular hypertrophy Anterior Q waves, possibly due to LVH Confirmed by Estelle June [id number]) on 12/24/2022 10:26:02 PM  Radiology DG Chest Port 1 View  Result Date: 12/24/2022 CLINICAL DATA:  Shortness of breath EXAM: PORTABLE CHEST 1 VIEW COMPARISON:  12/24/2022 FINDINGS: Heart and mediastinal contours are within normal limits. Diffuse interstitial and airspace opacities throughout the lungs. Focal  opacity in the right lower lobe. No effusions. No acute bony abnormality. IMPRESSION: Diffuse interstitial and airspace opacities concerning for edema. Focal airspace opacity in the right lower lobe could reflect asymmetric edema or infection. Electronically Signed   By: Charlett Nose M.D.   On: 12/24/2022 22:22   DG Chest Portable 1 View  Result Date: 12/24/2022 CLINICAL DATA:  Shortness of breath EXAM: PORTABLE CHEST 1 VIEW COMPARISON:  09/05/2022 FINDINGS: There is hyperinflation of the lungs compatible with COPD. Heart and mediastinal contours within normal limits. Aortic atherosclerosis. Small right pleural effusion. Right basilar opacity could reflect  atelectasis or infiltrate. Left lung clear. No acute bony abnormality. IMPRESSION: Hyperinflation/COPD. Small right pleural effusion with right base atelectasis or infiltrate. Electronically Signed   By: Charlett Nose M.D.   On: 12/24/2022 20:00    Procedures .Critical Care  Performed by: Royanne Foots, DO Authorized by: Royanne Foots, DO   Critical care provider statement:    Critical care time (minutes):  40   Critical care time was exclusive of:  Separately billable procedures and treating other patients and teaching time   Critical care was necessary to treat or prevent imminent or life-threatening deterioration of the following conditions:  Respiratory failure   Critical care was time spent personally by me on the following activities:  Development of treatment plan with patient or surrogate, discussions with consultants, evaluation of patient's response to treatment, examination of patient, ordering and review of laboratory studies, ordering and review of radiographic studies, ordering and performing treatments and interventions, pulse oximetry, re-evaluation of patient's condition and review of old charts   I assumed direction of critical care for this patient from another provider in my specialty: no     Care discussed with: admitting  provider       Medications Ordered in ED Medications  amiodarone (NEXTERONE PREMIX) 360-4.14 MG/200ML-% (1.8 mg/mL) IV infusion (60 mg/hr Intravenous New Bag/Given 12/24/22 2116)    Followed by  amiodarone (NEXTERONE PREMIX) 360-4.14 MG/200ML-% (1.8 mg/mL) IV infusion (has no administration in time range)  nitroGLYCERIN (NITROGLYN) 2 % ointment 1 inch (0 inches Topical Hold 12/24/22 2115)  nitroGLYCERIN 50 mg in dextrose 5 % 250 mL (0.2 mg/mL) infusion (0 mcg/min Intravenous Stopped 12/24/22 2115)  flecainide (TAMBOCOR) tablet 50 mg (has no administration in time range)  metoprolol succinate (TOPROL-XL) 24 hr tablet 25 mg (has no administration in time range)  irbesartan (AVAPRO) tablet 150 mg (has no administration in time range)  docusate sodium (COLACE) capsule 100 mg (has no administration in time range)  pantoprazole (PROTONIX) EC tablet 40 mg (has no administration in time range)  rosuvastatin (CRESTOR) tablet 5 mg (has no administration in time range)  apixaban (ELIQUIS) tablet 5 mg (has no administration in time range)  Ferric Maltol CAPS 30 mg (has no administration in time range)  pregabalin (LYRICA) capsule 50 mg (has no administration in time range)  fluticasone (FLONASE) 50 MCG/ACT nasal spray 2 spray (has no administration in time range)  montelukast (SINGULAIR) tablet 10 mg (has no administration in time range)  furosemide (LASIX) injection 40 mg (has no administration in time range)  acetaminophen (TYLENOL) tablet 650 mg (has no administration in time range)    Or  acetaminophen (TYLENOL) suppository 650 mg (has no administration in time range)  traZODone (DESYREL) tablet 25 mg (has no administration in time range)  magnesium hydroxide (MILK OF MAGNESIA) suspension 30 mL (has no administration in time range)  ondansetron (ZOFRAN) tablet 4 mg (has no administration in time range)    Or  ondansetron (ZOFRAN) injection 4 mg (has no administration in time range)  levalbuterol  (XOPENEX) nebulizer solution 0.63 mg (has no administration in time range)  metoprolol tartrate (LOPRESSOR) injection 5 mg (5 mg Intravenous Given 12/24/22 1849)  sodium chloride 0.9 % bolus 500 mL (0 mLs Intravenous Stopped 12/24/22 1849)  amiodarone (NEXTERONE) 1.8 mg/mL load via infusion 150 mg (150 mg Intravenous Bolus from Bag 12/24/22 1937)  nitroGLYCERIN 0.2 mg/mL in dextrose 5 % infusion (0 mcg/min  Stopped 12/24/22 2113)  furosemide (LASIX) injection 20 mg (20  mg Intravenous Given 12/24/22 2056)  ipratropium-albuterol (DUONEB) 0.5-2.5 (3) MG/3ML nebulizer solution 3 mL (3 mLs Nebulization Given 12/24/22 2154)    ED Course/ Medical Decision Making/ A&P Clinical Course as of 12/24/22 2226  Mon Dec 24, 2022  2219 Atrial fibrillation with rapid ventricular response did not resolve after 3 doses of 5 mg metoprolol.  Patient remained hemodynamically stable.  After discussing with cardiology, we initiated an amiodarone bolus with plan for amiodarone drip and attempt to control her rate.  She did return to normal sinus rhythm shortly after starting the amnio bolus (150 mg over 10 minutes).  However shortly thereafter I was called to bedside as the patient began to complain of chest pain shortness of breath.  There were audible crackles with her breathing and a stat chest x-ray did show new pulmonary edema.  With concern for sympathetic crashing acute pulmonary edema she was started on BiPAP we gave 20 mg IV Lasix.  Her pressure began to uptrend with a systolic in the 180s and for this reason we started nitroglycerin drip at 200 mcg which was quickly titrated down and off when her pressure return to the systolic 140s.  She is tolerating the BiPAP well and remains in normal sinus rhythm with the amiodarone drip going.  Discussed with intensivist as well as hospitalist service.  Patient will be admitted to medicine on hospitalist service and does not require ICU admission at this time. [MP]    Clinical Course  User Index [MP] Royanne Foots, DO                                 Medical Decision Making 77 year old female with history as above presenting for palpitations.  Hemodynamically stable and afebrile.  EKG notable for atrial fibrillation with rapid ventricular response.  She did not take her regular medications today including metoprolol.  She is prescribed flecainide but did not take flecainide either.  Last dose of Eliquis was at noon yesterday.  Potential underlying etiologies for A-fib with RVR include missed doses of medication, dehydration, pneumonia or acute heart failure with pulmonary edema.  We will evaluate for these while she is here in the ED.  Will provide small bolus of IV fluids (500 cc) and trial metoprolol as she is currently on metoprolol for home medication.  Will continue to monitor her pressure and treat underlying causes as they arise through her workup.  Amount and/or Complexity of Data Reviewed Labs: ordered. Radiology: ordered.  Risk Prescription drug management. Decision regarding hospitalization.           Final Clinical Impression(s) / ED Diagnoses Final diagnoses:  Atrial fibrillation with RVR (HCC)  Acute pulmonary edema [facility])    Rx / DC Orders ED Discharge Orders     None         Royanne Foots, DO 12/24/22 2225    Royanne Foots, DO 12/24/22 2226

## 2022-12-24 NOTE — Assessment & Plan Note (Deleted)
-   This is clearly secondary to acute pulmonary edema that could be related to acute on chronic diastolic CHF. - She will be admitted to stepdown unit bed. - We will continue her on BiPAP. - Will continue diuresis with IV Lasix. - 2D echo will be obtained. - Cardiology consult can be called in AM.

## 2022-12-24 NOTE — Progress Notes (Signed)
   12/24/22 2111  BiPAP/CPAP/SIPAP  $ Non-Invasive Ventilator  Non-Invasive Vent Initial  $ Face Mask Medium Yes  BiPAP/CPAP/SIPAP Pt Type Adult  BiPAP/CPAP/SIPAP V60  Mask Type Full face mask  Mask Size Medium  Set Rate 10 breaths/min  Respiratory Rate 35 breaths/min  IPAP 18 cmH20  EPAP 8 cmH2O  FiO2 (%) 100 %  Flow Rate 0 lpm  Minute Ventilation 15.5  Leak 2  Peak Inspiratory Pressure (PIP) 19  Tidal Volume (Vt) 458  Patient Home Equipment No  Auto Titrate No  Press High Alarm 30 cmH2O  Press Low Alarm 5 cmH2O   PT started on bipap per MD

## 2022-12-24 NOTE — Progress Notes (Signed)
Office Visit  Subjective   Patient ID: Dana Horton   DOB: 07-23-1945   Age: 77 y.o.   MRN: [id number]   Chief Complaint No chief complaint on file.    History of Present Illness Dana Horton is a 77 yo female who comes in today for an acute visit from outpatient procedures where they noted she had tachycardia where they were going to do a ferreheme infusion today.  I asked them not to give the infusion and have her come to the office for evaluation.  Her daughter states that last night her BP was low (down systolic in 80's).  Her daughter told her to hold her BP meds.  She states she was having some generalized weakness with fatigue and SOB.  She went for a ferreheme infusion this morning and they found her HR in the 140-160's.  Last night her HR was normal per the daughter who is an ICU nurse.  The patient did not take any of her meds this morning including her BP meds.  The patient has a history of paroxysmal Atrial Fib where I have referred her back to cardiology as she had not them since 2021.  She saw Dr. Dulce Sellar on 08/23/2022 where he felt her A. Fib was stable.   He wanted her to continue on flecainide and her anticoagulant.  Her A. Fib was initially diagnosed in 2018 where she came to see me on 08/2016 with chest pain where she was sent to [facility] and was admitted.   An ECHO was performed on 09/14/2016 showed a normal LVEF of 60-65% with impaired relaxation with mildly dilated LA and mild TR and MR.  She is currently on metoprolol with flecanide.  Her CHAD-VASC2 score is a 3.  In the hospital initially, she was started on an ASA but had hematochezia.  She was sent to GI where they did an EGD which was normal and a colonoscopy that showed polyps.  They ultimately felt that her bleeding was from an anal source and felt she could go on anticoagulation.   Today, she denies any chest pain, palpitations, swelling or other problems.  She denies any BRBPR, melena, bleeding, bruising or  other problems from her Eliquis.   She is on eliquis 5mg  BID.     Past Medical History Past Medical History:  Diagnosis Date   Allergy    Atrial fibrillation (HCC)    Atrial fibrillation with rapid ventricular response (HCC) 04/10/2017   Bladder tumor 08/06/2022   BMI 28.0-28.9,adult 05/21/2022   Bronchitis 03/07/2022   CHF (congestive heart failure) (HCC)    Chronic anticoagulation 06/10/2019   Eliquis   Chronic diastolic heart failure (HCC)    Chronic obstructive pulmonary disease (HCC) 05/21/2022   Cigarette smoker 05/21/2022   CKD (chronic kidney disease) stage 3, GFR 30-59 ml/min (HCC)    COPD exacerbation (HCC) 04/08/2017   Demand ischemia 09/25/2016   Normal coronaries Jan 2019 when admitted with AF with RVR and chest pain   Dysrhythmia    a-fib   Elevated troponin    Essential hypertension    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 06/29/2022   GERD (gastroesophageal reflux disease)    HCAP (healthcare-associated pneumonia) 04/10/2017   Heart murmur    History of bladder cancer    Hypercholesterolemia 05/21/2022   Iron deficiency anemia due to chronic blood loss 06/29/2022   Lung nodule    Muscle strain 08/16/2022   Myocardial infarction [facility])    ? small with  atrial fib    Neck pain 08/16/2022   Non-ST elevation (NSTEMI) myocardial infarction (HCC) 04/07/2017   OSA on CPAP 04/08/2017   Osteopenia 05/21/2022   Osteoporosis    PAF (paroxysmal atrial fibrillation) (HCC) 09/24/2016   CHADS2 vasc=3   Precordial pain 10/17/2015   Normal coronary angiography 10/17/15   Primary osteoarthritis involving multiple joints 05/21/2022   Renal insufficiency 06/10/2019   New- medications adjusted by her PCP   Seasonal allergies    Sleep apnea    cpap  setting at 2.5 per patient    Stage 3a chronic kidney disease (HCC) 05/21/2022   Symptomatic anemia 08/07/2022   Tobacco abuse      Allergies Allergies  Allergen Reactions   Albuterol  Palpitations   Lisinopril Cough     Medications  Current Outpatient Medications:    acetaminophen (TYLENOL) 500 MG tablet, Take 500 mg by mouth every 6 (six) hours as needed for moderate pain or headache., Disp: , Rfl:    apixaban (ELIQUIS) 5 MG TABS tablet, Take 1 tablet (5 mg total) by mouth 2 (two) times daily., Disp: 180 tablet, Rfl: 3   docusate sodium (COLACE) 100 MG capsule, Take 100 mg by mouth daily., Disp: , Rfl:    Ferric Maltol 30 MG CAPS, Take 1 capsule (30 mg total) by mouth daily., Disp: 90 capsule, Rfl: 1   flecainide (TAMBOCOR) 50 MG tablet, Take 1 tablet (50 mg total) by mouth 2 (two) times daily., Disp: 180 tablet, Rfl: 3   fluticasone (FLONASE) 50 MCG/ACT nasal spray, Place 2 sprays into both nostrils daily., Disp: 15.8 mL, Rfl: 0   levalbuterol (XOPENEX HFA) 45 MCG/ACT inhaler, Inhale 1-2 puffs into the lungs every 4 (four) hours as needed for wheezing. (Patient taking differently: Inhale 1-2 puffs into the lungs 2 (two) times daily.), Disp: 1 each, Rfl: 12   metoprolol succinate (TOPROL-XL) 25 MG 24 hr tablet, TAKE ONE TABLET BY MOUTH TWICE DAILY WITH OR immediately following A meal, Disp: 90 tablet, Rfl: 1   montelukast (SINGULAIR) 10 MG tablet, Take 1 tablet (10 mg total) by mouth every evening., Disp: 90 tablet, Rfl: 1   pantoprazole (PROTONIX) 40 MG tablet, Take 1 tablet (40 mg total) by mouth daily., Disp: 90 tablet, Rfl: 0   pregabalin (LYRICA) 50 MG capsule, Take 1 capsule (50 mg total) by mouth 2 (two) times daily., Disp: 60 capsule, Rfl: 0   Psyllium (VEGETABLE LAXATIVE PO), Take 1 tablet by mouth daily., Disp: , Rfl:    rosuvastatin (CRESTOR) 5 MG tablet, Take 5 mg by mouth every other day. In the morning, Disp: , Rfl:    SYMBICORT 160-4.5 MCG/ACT inhaler, Inhale 2 puffs into the lungs 2 (two) times daily., Disp: 1 each, Rfl: 3   valsartan (DIOVAN) 160 MG tablet, Take 1 tablet (160 mg total) by mouth daily., Disp: 90 tablet, Rfl: 3  Current Facility-Administered  Medications:    ipratropium-albuterol (DUONEB) 0.5-2.5 (3) MG/3ML nebulizer solution 3 mL, 3 mL, Nebulization, Once, Crist Fat, MD   Review of Systems Review of Systems  Constitutional:  Positive for malaise/fatigue. Negative for chills and fever.  Respiratory:  Positive for cough, shortness of breath and wheezing.        Clear sputum from her cough  Cardiovascular:  Positive for palpitations. Negative for chest pain and leg swelling.  Gastrointestinal:  Negative for abdominal pain, constipation, diarrhea, nausea and vomiting.  Genitourinary:  Negative for dysuria, frequency and hematuria.  Skin:  Negative for itching and  rash.  Neurological:  Positive for weakness and headaches. Negative for dizziness.       Objective:    Vitals BP 110/60   Pulse (!) 167   Temp 97.9 F (36.6 C)   Resp 20   Ht 5\' 3"  (1.6 m)   Wt 129 lb 9.6 oz (58.8 kg)   LMP  (LMP Unknown)   SpO2 97%   BMI 22.96 kg/m    Physical Examination Physical Exam Constitutional:      Appearance: Normal appearance. She is not ill-appearing.  Cardiovascular:     Rate and Rhythm: Tachycardia present. Rhythm irregular.     Pulses: Normal pulses.     Heart sounds: No murmur heard.    No friction rub. No gallop.  Pulmonary:     Effort: Pulmonary effort is normal. No respiratory distress.     Breath sounds: No wheezing, rhonchi or rales.  Abdominal:     General: Bowel sounds are normal. There is no distension.     Palpations: Abdomen is soft.     Tenderness: There is no abdominal tenderness.  Musculoskeletal:     Right lower leg: No edema.     Left lower leg: No edema.  Skin:    General: Skin is warm and dry.     Findings: No rash.  Neurological:     Mental Status: She is alert.        Assessment & Plan:   No problem-specific Assessment & Plan notes found for this encounter.    No follow-ups on file.   Crist Fat, MD

## 2022-12-24 NOTE — Assessment & Plan Note (Addendum)
Continue rate control with amiodarone and anticoagulation with apixaban.  Continue telemetry monitoring.  

## 2022-12-24 NOTE — ED Notes (Signed)
Pt c/o feeling increased SHOB and burning in her chest. Pt placed on nasal cannula at 3 lpm and MD notified

## 2022-12-24 NOTE — Assessment & Plan Note (Signed)
-   She will place on IV nitroglycerin drip that will be tapered down with improvement of her blood pressure and pulmonary edema. - We will continue her antihypertensive therapy.

## 2022-12-24 NOTE — Assessment & Plan Note (Signed)
Her HR is running 140-160.  Her BP is stable.  I am concerned she has had low BP's.  She is also having a cough today.   I want her to go to the ER to get her labs done including a CBC to make sure she is not worsened with anemia and to see if she has an infection.  She will also need a CXR.  She will more than likely need to be admitted for her A. Fib.  An EKG was done that showed A. Fib.

## 2022-12-24 NOTE — H&P (Addendum)
[place]   PATIENT NAME: Dana Horton    MR#:  [id number]  DATE OF BIRTH:  03/01/1946  DATE OF ADMISSION:  12/24/2022  PRIMARY CARE PHYSICIAN: Crist Fat, MD   Patient is coming from: Home  REQUESTING/REFERRING PHYSICIAN: Estelle June, DO  CHIEF COMPLAINT:   Chief Complaint  Patient presents with   Tachycardia    HISTORY OF PRESENT ILLNESS:  Dana Horton is a 77 y.o. Caucasian female with medical history significant for atrial fibrillation on Eliquis, stage III chronic kidney disease, COPD, CHF, GERD, hypertension, carotid artery disease and OSA on CPAP, who presented to the emergency room with acute onset of palpitations with associated generalized weakness since yesterday.  Her daughter who lives across the street from her came over to check on her and noted she was having low blood pressure but the heart rate then was in the 80s-90s.  She is not taking her medications including metoprolol and Eliquis".  She started having palpitations this afternoon and it persisted since then.  She was scheduled to have an iron infusion but was noted to have atrial fibrillation with RVR and therefore sent to the emergency room.  She denies any chest pain but has been having recent productive cough without wheezing or hemoptysis.  No nausea or vomiting or abdominal pain.  She is having diminished appetite and her daughter was concerned about dehydration.  She took her last Eliquis at noon yesterday.  No dysuria, oliguria or hematuria or flank pain.  ED Course: When she came to the ER, BP was 1 110/60 and heart rate 167 with otherwise normal vital signs.  Later on BP was 96/63 then she was noted to be in respiratory distress.  She was initially placed on 2 L of O2 by nasal cannula with pulse oximetry of 100% and later on given respiratory distress and pulmonary edema on chest x-ray was placed on BiPAP. EKG as reviewed by me : Initial EKG showed atrial fibrillation  with RVR 146 with LVH.  Repeat EKG showed normal sinus rhythm with rate of 73.  Another repeat EKG showed sinus rhythm with a rate of 86 and probable left atrial enlargement with LVH. Imaging: She had a portable chest x-ray earlier that showed small right pleural effusion3 fusion with right basal atelectasis or infiltrate effusion with right basal atelectasis or infiltrate.  Repeat portable chest x-ray was concerning for residual pulmonary edema.  Blood pressures improved later on and actually was elevated later.  She was given 20 mg of IV Lasix and initially 3 doses of IV Lopressor 5 mg then was placed on IV amiodarone with bolus and drip and converted to normal sinus rhythm.  She was placed on IV nitroglycerin drip when her BP was elevated.  She will be admitted to a stepdown unit bed for further evaluation and management. PAST MEDICAL HISTORY:   Past Medical History:  Diagnosis Date   Allergy    Atrial fibrillation [facility])    Atrial fibrillation with rapid ventricular response (HCC) 04/10/2017   Bladder tumor 08/06/2022   BMI 28.0-28.9,adult 05/21/2022   Bronchitis 03/07/2022   CHF (congestive heart failure) (HCC)    Chronic anticoagulation 06/10/2019   Eliquis   Chronic diastolic heart failure (HCC)    Chronic obstructive pulmonary disease (HCC) 05/21/2022   Cigarette smoker 05/21/2022   CKD (chronic kidney disease) stage 3, GFR 30-59 ml/min (HCC)    COPD exacerbation (HCC) 04/08/2017   Demand ischemia 09/25/2016   Normal  coronaries [facility] 2019 when admitted with AF with RVR and chest pain   Dysrhythmia    a-fib   Elevated troponin    Essential hypertension    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 06/29/2022   GERD (gastroesophageal reflux disease)    HCAP (healthcare-associated pneumonia) 04/10/2017   Heart murmur    History of bladder cancer    Hypercholesterolemia 05/21/2022   Iron deficiency anemia due to chronic blood loss 06/29/2022   Lung nodule     Muscle strain 08/16/2022   Myocardial infarction [facility])    ? 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/21/2022   Renal insufficiency 06/10/2019   New- medications adjusted by her PCP   Seasonal allergies    Sleep apnea    cpap  setting at 2.5 per patient    Stage 3a chronic kidney disease (HCC) 05/21/2022   Symptomatic anemia 08/07/2022   Tobacco abuse     PAST SURGICAL HISTORY:   Past Surgical History:  Procedure Laterality Date   ABDOMINAL HYSTERECTOMY     BLADDER SURGERY     COLONOSCOPY N/A 12/28/2016   Procedure: COLONOSCOPY;  Surgeon: Sherrilyn Rist, MD;  Location: [facility] ENDOSCOPY;  Service: Gastroenterology;  Laterality: N/A;   LEFT HEART CATH AND CORONARY ANGIOGRAPHY N/A 04/09/2017   Procedure: LEFT HEART CATH AND CORONARY ANGIOGRAPHY;  Surgeon: Corky Crafts, MD;  [facility] INVASIVE CV LAB;  Service: Cardiovascular;  Laterality: N/A;   MOUTH SURGERY     POLYPECTOMY N/A 12/28/2016   Procedure: POLYPECTOMY with injection of Elevue;  Surgeon: Sherrilyn Rist, MD;  Location: [facility] ENDOSCOPY;  Service: Gastroenterology;  Laterality: N/A;   TRANSURETHRAL RESECTION OF BLADDER TUMOR N/A 08/09/2022   Procedure: TRANSURETHRAL RESECTION OF BLADDER TUMOR (TURBT) BILATERAL RETROGRADES;  Surgeon: Loletta Parish., MD;  Location: [facility] ORS;  Service: Urology;  Laterality: N/A;   TRANSURETHRAL RESECTION OF BLADDER TUMOR N/A 10/05/2022   Procedure: RESTAGING TRANSURETHRAL RESECTION OF BLADDER TUMOR (TURBT), CYSTOSCOPY, BILATERAL RETROGRADE PYELOGRAM;  Surgeon: Loletta Parish., MD;  Location: [facility] ORS;  Service: Urology;  Laterality: N/A;  1 HR     SOCIAL HISTORY:   Social History   Tobacco Use   Smoking status: Every Day    Current packs/day: 0.50    Average packs/day: 0.5 packs/day for 52.0 years (26.0 ttl pk-yrs)    Types: Cigarettes   Smokeless tobacco: Never   Tobacco comments:    1/2 pack a day  Substance Use Topics   Alcohol use: No    FAMILY HISTORY:   Family History  Problem Relation Age of Onset   Hypertension Mother    Stroke Mother    Cancer Father    Heart disease Father    CAD Father    Arrhythmia Father    Bladder Cancer Brother    Lung cancer Brother    Colon cancer Neg Hx    Esophageal cancer Neg Hx    Stomach cancer Neg Hx    Colon polyps Neg Hx     DRUG ALLERGIES:   Allergies  Allergen Reactions   Albuterol Palpitations   Lisinopril Cough    REVIEW OF SYSTEMS:  ROS As per history of present illness. All pertinent systems were reviewed above. Constitutional, HEENT, cardiovascular, respiratory, GI, GU, musculoskeletal, neuro, psychiatric, endocrine, integumentary and hematologic systems were reviewed and are otherwise negative/unremarkable except for positive findings mentioned above in the HPI.   MEDICATIONS AT HOME:   Prior to Admission medications   Medication Sig Start Date End Date Taking? Authorizing Provider  acetaminophen (TYLENOL) 500 MG tablet Take 500 mg by mouth every 6 (six) hours as needed for moderate pain or headache.    [provider]  apixaban (ELIQUIS) 5 MG TABS tablet Take 1 tablet (5 mg total) by mouth 2 (two) times daily. 08/12/22   Azucena Fallen, MD  docusate sodium (COLACE) 100 MG capsule Take 100 mg by mouth daily.    [provider]  Ferric Maltol 30 MG CAPS Take 1 capsule (30 mg total) by mouth daily. 12/24/22   Crist Fat, MD  flecainide (TAMBOCOR) 50 MG tablet Take 1 tablet (50 mg total) by mouth 2 (two) times daily. 07/25/22   Baldo Daub, MD  fluticasone (FLONASE) 50 MCG/ACT nasal spray Place 2 sprays into both nostrils  daily. 03/07/22   Crist Fat, MD  levalbuterol [facility] HFA) 45 MCG/ACT inhaler Inhale 1-2 puffs into the lungs every 4 (four) hours as needed for wheezing. Patient taking differently: Inhale 1-2 puffs into the lungs 2 (two) times daily. 05/21/22   Crist Fat, MD  metoprolol succinate (TOPROL-XL) 25 MG 24 hr tablet TAKE ONE TABLET BY MOUTH TWICE DAILY WITH OR immediately following A meal 08/10/22   Crist Fat, MD  montelukast (SINGULAIR) 10 MG tablet Take 1 tablet (10 mg total) by mouth every evening. 08/06/22   Crist Fat, MD  pantoprazole (PROTONIX) 40 MG tablet Take 1 tablet (40 mg total) by mouth daily. 10/08/22   Crist Fat, MD  pregabalin (LYRICA) 50 MG capsule Take 1 capsule (50 mg total) by mouth 2 (two) times daily. 08/16/22   Crist Fat, MD  Psyllium (VEGETABLE LAXATIVE PO) Take 1 tablet by mouth daily.    [provider]  rosuvastatin (CRESTOR) 5 MG tablet Take 5 mg by mouth every other day. In the morning    [provider]  SYMBICORT 160-4.5 MCG/ACT inhaler Inhale 2 puffs into the lungs 2 (two) times daily. 05/21/22   Crist Fat, MD  valsartan (DIOVAN) 160 MG tablet Take 1 tablet (160 mg total) by mouth daily. 07/25/22   Baldo Daub, MD      VITAL SIGNS:  Blood pressure 133/77, pulse 80, temperature 97.9 F (36.6 C), temperature source Oral, resp. rate (!) 33, height 5\' 3"  (1.6 m), weight 58.5 kg, SpO2 100%.  PHYSICAL EXAMINATION:  Physical Exam  GENERAL: Acutely ill 77 y.o.-year-old Caucasian female patient lying in the bed with mild respiratory distress with conversational dyspnea, on BiPAP. EYES: Pupils equal, round, reactive to light and accommodation. No scleral icterus. Extraocular muscles intact.  HEENT: Head atraumatic, normocephalic. Oropharynx and nasopharynx clear.  NECK:  Supple, no jugular venous distention. No thyroid enlargement, no tenderness.  LUNGS: Diminished bibasilar breath sounds with bibasal rales.  No use of  accessory muscles of respiration.  CARDIOVASCULAR: Regular rate and rhythm, S1, S2 normal. No murmurs, rubs, or gallops.  ABDOMEN: Soft, nondistended, nontender. Bowel sounds present. No organomegaly or mass.  EXTREMITIES: No pedal edema, cyanosis, or clubbing.  NEUROLOGIC: Cranial nerves II through XII are intact. Muscle strength 5/5 in all extremities. Sensation intact. Gait  not checked.  PSYCHIATRIC: The patient is alert and oriented x 3.  Normal affect and good eye contact. SKIN: No obvious rash, lesion, or ulcer.   LABORATORY PANEL:   CBC Recent Labs  Lab 12/24/22 1701  WBC 7.9  HGB 8.4*  HCT 27.8*  PLT 157   ------------------------------------------------------------------------------------------------------------------  Chemistries  Recent Labs  Lab 12/24/22 1701  NA 139  K 3.5  CL 105  CO2 25  GLUCOSE 98  BUN 17  CREATININE 0.87  CALCIUM 8.8*  MG 1.9  AST 13*  ALT 11  ALKPHOS 70  BILITOT 0.7   ------------------------------------------------------------------------------------------------------------------  Cardiac Enzymes No results for input(s): "TROPONINI" in the last 168 hours. ------------------------------------------------------------------------------------------------------------------  RADIOLOGY:  DG Chest Portable 1 View  Result Date: 12/24/2022 CLINICAL DATA:  Shortness of breath EXAM: PORTABLE CHEST 1 VIEW COMPARISON:  09/05/2022 FINDINGS: There is hyperinflation of the lungs compatible with COPD. Heart and mediastinal contours within normal limits. Aortic atherosclerosis. Small right pleural effusion. Right basilar opacity could reflect atelectasis or infiltrate. Left lung clear. No acute bony abnormality. IMPRESSION: Hyperinflation/COPD. Small right pleural effusion with right base atelectasis or infiltrate. Electronically Signed   By: Charlett Nose M.D.   On: 12/24/2022 20:00      IMPRESSION AND PLAN:  Assessment and Plan: * Acute  respiratory failure with hypoxia (HCC) - This is clearly secondary to acute pulmonary edema that could be related to acute on chronic diastolic CHF. - She will be admitted to stepdown unit bed. - We will continue her on BiPAP. - Will continue diuresis with IV Lasix. - 2D echo will be obtained. - Cardiology consult can be called in AM.  Paroxysmal atrial fibrillation with RVR (HCC) - We will continue her on IV amiodarone drip. - We will continue her beta-blocker therapy and Tambocor. - Cardiology consult to be obtained as mentioned above. - We will continue her Eliquis.  Hypertensive urgency - She will place on IV nitroglycerin drip that will be tapered down with improvement of her blood pressure and pulmonary edema. - We will continue her antihypertensive therapy.  Acute on chronic anemia - We will monitor H&H given the slight drop of her H&H from 9.7/31.8 to 8.4/27.8.  Peripheral neuropathy - We will continue Lyrica.  GERD without esophagitis - We will continue PPI therapy.    DVT prophylaxis: Eliquis Advanced Care Planning:  Code Status: DNR and DNI. Discussed with pt and daughter. Family Communication:  The plan of care was discussed in details with the patient (and family). I answered all questions. The patient agreed to proceed with the above mentioned plan. Further management will depend upon hospital course. Disposition Plan: Back to previous home environment Consults called: Cardiology consult can be called in a.m. ER physician consulted intensivist. All the records are reviewed and case discussed with ED provider.  Status is: Inpatient   At the time of the admission, it appears that the appropriate admission status for this patient is inpatient.  This is judged to be reasonable and necessary in order to provide the required intensity of service to ensure the patient's safety given the presenting symptoms, physical exam findings and initial radiographic and laboratory  data in the context of comorbid conditions.  The patient requires inpatient status due to high intensity of service, high risk of further deterioration and high frequency of surveillance required.  I certify that at the time of admission, it is my clinical judgment that the patient will require inpatient hospital care extending more than 2  midnights.                            Dispo: The patient is from: Home              Anticipated d/c is to: Home              Patient currently is not medically stable to d/c.              Difficult to place patient: No  Authorized and performed by: Valente David, MD Total critical care time:   45 minutes. Due to a high probability of clinically significant, life-threatening deterioration, the patient required my highest level of preparedness to intervene emergently and I personally spent this critical care time directly and personally managing the patient.  This critical care time included obtaining a history, examining the patient, pulse oximetry, ordering and review of studies, arranging urgent treatment with development of management plan, evaluation of patient's response to treatment, frequent reassessment, and discussions with other providers. This critical care time was performed to assess and manage the high probability of imminent, life-threatening deterioration that could result in multiorgan failure.  It was exclusive of separately billable procedures and treating other patients and teaching time.    Hannah Beat M.D on 12/24/2022 at 9:57 PM  [facility]   From 7 PM-7 AM, contact night-coverage [email or web address]  CC: Primary care physician; Crist Fat, MD

## 2022-12-24 NOTE — ED Notes (Signed)
ED TO INPATIENT HANDOFF REPORT  Name/Age/Gender Dana Horton 77 y.o. female  Code Status    Code Status Orders  (From admission, onward)           Start     Ordered   12/24/22 2144  Full code  Continuous       Question:  By:  Answer:  Consent: discussion documented in EHR   12/24/22 2144           Code Status History     Date Active Date Inactive Code Status Order ID Comments User Context   08/07/2022 0040 08/09/2022 2151 Full Code [id number]  Lanae Boast, MD ED   04/07/2017 1606 04/13/2017 1621 Full Code [id number]  Berton Bon, NP Inpatient      Advance Directive Documentation    Flowsheet Row Most Recent Value  Type of Advance Directive Healthcare Power of Attorney, Living will  Pre-existing out of facility DNR order (yellow form or pink MOST form) --  "MOST" Form in Place? --       Home/SNF/Other Home  Chief Complaint Acute respiratory failure (HCC) [J96.00]  Level of Care/Admitting Diagnosis ED Disposition     ED Disposition  Admit   Condition  --   Comment  Hospital Area: [facility] [[id number]]  Level of Care: Stepdown [14]  Admit to SDU based on following criteria: Respiratory Distress:  Frequent assessment and/or intervention to maintain adequate ventilation/respiration, pulmonary toilet, and respiratory treatment.  May admit patient to [facility] or [facility] if equivalent level of care is available:: No  Covid Evaluation: Asymptomatic - no recent exposure (last 10 days) testing not required  Diagnosis: Acute respiratory failure (HCC) [518.81.ICD-9-CM]  Admitting Physician: Hannah Beat [[id number]]  Attending Physician: Hannah Beat [[id number]]  Certification:: I certify this patient will need inpatient services for at least 2 midnights  Expected Medical Readiness: 12/26/2022          Medical History Past Medical History:  Diagnosis Date   Allergy    Atrial fibrillation [facility])    Atrial fibrillation  with rapid ventricular response (HCC) 04/10/2017   Bladder tumor 08/06/2022   BMI 28.0-28.9,adult 05/21/2022   Bronchitis 03/07/2022   CHF (congestive heart failure) (HCC)    Chronic anticoagulation 06/10/2019   Eliquis   Chronic diastolic heart failure (HCC)    Chronic obstructive pulmonary disease (HCC) 05/21/2022   Cigarette smoker 05/21/2022   CKD (chronic kidney disease) stage 3, GFR 30-59 ml/min (HCC)    COPD exacerbation (HCC) 04/08/2017   Demand ischemia 09/25/2016   Normal coronaries Jan 2019 when admitted with AF with RVR and chest pain   Dysrhythmia    a-fib   Elevated troponin    Essential hypertension    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 06/29/2022   GERD (gastroesophageal reflux disease)    HCAP (healthcare-associated pneumonia) 04/10/2017   Heart murmur    History of bladder cancer    Hypercholesterolemia 05/21/2022   Iron deficiency anemia due to chronic blood loss 06/29/2022   Lung nodule    Muscle strain 08/16/2022   Myocardial infarction [facility])    ? small with atrial fib    Neck pain 08/16/2022   Non-ST elevation (NSTEMI) myocardial infarction (HCC) 04/07/2017   OSA on CPAP 04/08/2017   Osteopenia 05/21/2022   Osteoporosis    PAF (paroxysmal atrial fibrillation) (HCC) 09/24/2016   CHADS2 vasc=3   Precordial pain 10/17/2015   Normal coronary angiography 10/17/15   Primary osteoarthritis involving multiple  joints 05/21/2022   Renal insufficiency 06/10/2019   New- medications adjusted by her PCP   Seasonal allergies    Sleep apnea    cpap  setting at 2.5 per patient    Stage 3a chronic kidney disease (HCC) 05/21/2022   Symptomatic anemia 08/07/2022   Tobacco abuse     Allergies Allergies  Allergen Reactions   Albuterol Palpitations   Lisinopril Cough    IV Location/Drains/Wounds Patient Lines/Drains/Airways Status     Active Line/Drains/Airways     Name Placement date Placement time Site Days   Peripheral  IV 12/24/22 20 G Anterior;Left;Proximal Forearm 12/24/22  1700  Forearm  less than 1   Peripheral IV 12/24/22 20 G Right;Posterior Forearm 12/24/22  2016  Forearm  less than 1            Labs/Imaging Results for orders placed or performed during the hospital encounter of 12/24/22 (from the past 48 hour(s))  CBC with Differential     Status: Abnormal   Collection Time: 12/24/22  5:01 PM  Result Value Ref Range   WBC 7.9 4.0 - 10.5 K/uL   RBC 2.88 (L) 3.87 - 5.11 MIL/uL   Hemoglobin 8.4 (L) 12.0 - 15.0 g/dL   HCT 16.1 (L) 09.6 - 04.5 %   MCV 96.5 80.0 - 100.0 fL   MCH 29.2 26.0 - 34.0 pg   MCHC 30.2 30.0 - 36.0 g/dL   RDW 40.9 (H) 81.1 - 91.4 %   Platelets 157 150 - 400 K/uL   nRBC 0.0 0.0 - 0.2 %   Neutrophils Relative % 73 %   Neutro Abs 5.8 1.7 - 7.7 K/uL   Lymphocytes Relative 20 %   Lymphs Abs 1.6 0.7 - 4.0 K/uL   Monocytes Relative 6 %   Monocytes Absolute 0.5 0.1 - 1.0 K/uL   Eosinophils Relative 0 %   Eosinophils Absolute 0.0 0.0 - 0.5 K/uL   Basophils Relative 0 %   Basophils Absolute 0.0 0.0 - 0.1 K/uL   Immature Granulocytes 1 %   Abs Immature Granulocytes 0.04 0.00 - 0.07 K/uL    Comment: Performed at [facility], [address], [place], [place] [address]  Troponin I (High Sensitivity)     Status: None   Collection Time: 12/24/22  5:01 PM  Result Value Ref Range   Troponin I (High Sensitivity) 11 <18 ng/L    Comment: (NOTE) Elevated high sensitivity troponin I (hsTnI) values and significant  changes across serial measurements may suggest ACS but many other  chronic and acute conditions are known to elevate hsTnI results.  Refer to the "Links" section for chest pain algorithms and additional  guidance. Performed at [facility], [address], [place], [place] [address]   Comprehensive metabolic panel     Status: Abnormal   Collection Time: 12/24/22  5:01 PM  Result Value Ref Range   Sodium 139 135 - 145 mmol/L    Potassium 3.5 3.5 - 5.1 mmol/L   Chloride 105 98 - 111 mmol/L   CO2 25 22 - 32 mmol/L   Glucose, Bld 98 70 - 99 mg/dL    Comment: Glucose reference range applies only to samples taken after fasting for at least 8 hours.   BUN 17 8 - 23 mg/dL   Creatinine, Ser 8.65 0.44 - 1.00 mg/dL   Calcium 8.8 (L) 8.9 - 10.3 mg/dL   Total Protein 7.0 6.5 - 8.1 g/dL   Albumin 2.9 (L) 3.5 -  5.0 g/dL   AST 13 (L) 15 - 41 U/L   ALT 11 0 - 44 U/L   Alkaline Phosphatase 70 38 - 126 U/L   Total Bilirubin 0.7 0.3 - 1.2 mg/dL   GFR, Estimated >16 >10 mL/min    Comment: (NOTE) Calculated using the CKD-EPI Creatinine Equation (2021)    Anion gap 9 5 - 15    Comment: Performed at [facility], [address], [place], [place] [address]  Brain natriuretic peptide     Status: Abnormal   Collection Time: 12/24/22  5:01 PM  Result Value Ref Range   B Natriuretic Peptide 1,180.3 (H) 0.0 - 100.0 pg/mL    Comment: Performed at [facility], [address] [place], [place] [address]  Magnesium     Status: None   Collection Time: 12/24/22  5:01 PM  Result Value Ref Range   Magnesium 1.9 1.7 - 2.4 mg/dL    Comment: Performed at [facility], [address] [place], [place] [address]  Troponin I (High Sensitivity)     Status: None   Collection Time: 12/24/22  8:15 PM  Result Value Ref Range   Troponin I (High Sensitivity) 13 <18 ng/L    Comment: (NOTE) Elevated high sensitivity troponin I (hsTnI) values and significant  changes across serial measurements may suggest ACS but many other  chronic and acute conditions are known to elevate hsTnI results.  Refer to the "Links" section for chest pain algorithms and additional  guidance. Performed at [facility], [address] [place], [place] [address]    DG Chest Portable 1 View  Result Date: 12/24/2022 CLINICAL DATA:  Shortness of breath EXAM: PORTABLE CHEST 1 VIEW COMPARISON:   09/05/2022 FINDINGS: There is hyperinflation of the lungs compatible with COPD. Heart and mediastinal contours within normal limits. Aortic atherosclerosis. Small right pleural effusion. Right basilar opacity could reflect atelectasis or infiltrate. Left lung clear. No acute bony abnormality. IMPRESSION: Hyperinflation/COPD. Small right pleural effusion with right base atelectasis or infiltrate. Electronically Signed   By: Charlett Nose M.D.   On: 12/24/2022 20:00    Pending Labs Unresulted Labs (From admission, onward)     Start     Ordered   12/25/22 0500  Basic metabolic panel  Tomorrow morning,   R        12/24/22 2144   12/25/22 0500  CBC  Tomorrow morning,   R        12/24/22 2144            Vitals/Pain Today's Vitals   12/24/22 2045 12/24/22 2100 12/24/22 2115 12/24/22 2130  BP:  (!) 185/74 (!) 147/57 133/77  Pulse: 88 87 81 80  Resp: (!) 21 (!) 31 (!) 35 (!) 33  Temp:      TempSrc:      SpO2: 99% 99% 100% 100%  Weight:      Height:      PainSc:        Isolation Precautions No active isolations  Medications Medications  amiodarone (NEXTERONE PREMIX) 360-4.14 MG/200ML-% (1.8 mg/mL) IV infusion (60 mg/hr Intravenous New Bag/Given 12/24/22 2116)    Followed by  amiodarone (NEXTERONE PREMIX) 360-4.14 MG/200ML-% (1.8 mg/mL) IV infusion (has no administration in time range)  nitroGLYCERIN (NITROGLYN) 2 % ointment 1 inch (0 inches Topical Hold 12/24/22 2115)  nitroGLYCERIN 50 mg in dextrose 5 % 250 mL (0.2 mg/mL) infusion (0 mcg/min Intravenous Stopped 12/24/22 2115)  flecainide (TAMBOCOR) tablet  50 mg (has no administration in time range)  metoprolol succinate (TOPROL-XL) 24 hr tablet 25 mg (has no administration in time range)  irbesartan (AVAPRO) tablet 150 mg (has no administration in time range)  docusate sodium (COLACE) capsule 100 mg (has no administration in time range)  pantoprazole (PROTONIX) EC tablet 40 mg (has no administration in time range)  rosuvastatin  (CRESTOR) tablet 5 mg (has no administration in time range)  apixaban (ELIQUIS) tablet 5 mg (has no administration in time range)  Ferric Maltol CAPS 30 mg (has no administration in time range)  pregabalin (LYRICA) capsule 50 mg (has no administration in time range)  fluticasone (FLONASE) 50 MCG/ACT nasal spray 2 spray (has no administration in time range)  ipratropium-albuterol (DUONEB) 0.5-2.5 (3) MG/3ML nebulizer solution 3 mL (has no administration in time range)  levalbuterol (XOPENEX HFA) inhaler 1-2 puff (has no administration in time range)  montelukast (SINGULAIR) tablet 10 mg (has no administration in time range)  furosemide (LASIX) injection 40 mg (has no administration in time range)  acetaminophen (TYLENOL) tablet 650 mg (has no administration in time range)    Or  acetaminophen (TYLENOL) suppository 650 mg (has no administration in time range)  traZODone (DESYREL) tablet 25 mg (has no administration in time range)  magnesium hydroxide (MILK OF MAGNESIA) suspension 30 mL (has no administration in time range)  ondansetron (ZOFRAN) tablet 4 mg (has no administration in time range)    Or  ondansetron (ZOFRAN) injection 4 mg (has no administration in time range)  metoprolol tartrate (LOPRESSOR) injection 5 mg (5 mg Intravenous Given 12/24/22 1849)  sodium chloride 0.9 % bolus 500 mL (0 mLs Intravenous Stopped 12/24/22 1849)  amiodarone (NEXTERONE) 1.8 mg/mL load via infusion 150 mg (150 mg Intravenous Bolus from Bag 12/24/22 1937)  nitroGLYCERIN 0.2 mg/mL in dextrose 5 % infusion (0 mcg/min  Stopped 12/24/22 2113)  furosemide (LASIX) injection 20 mg (20 mg Intravenous Given 12/24/22 2056)    Mobility walks

## 2022-12-24 NOTE — Assessment & Plan Note (Addendum)
Follow up hgb 8,7  Iron panel suggesting anemia of chronic disease.  Iron 46, TIBC 209, transferrin saturation 22, ferritin 562.

## 2022-12-24 NOTE — Consult Note (Signed)
NAME:  Dana Horton, MRN:  [id number], DOB:  Feb 14, 1946, LOS: 0 ADMISSION DATE:  12/24/2022, CONSULTATION DATE: 12/24/2022 REFERRING MD:  Dr Elayne Snare, CHIEF COMPLAINT: Hypoxemia  History of Present Illness:  77 year old woman with a history of tobacco use, atrial fibrillation with prior RVR, hypertension, diastolic CHF, COPD, CKD stage IIIa, GERD, bladder cancer, hyperlipidemia.  Per her daughter at bedside she was treated for pneumonia 1 month ago. She began to feel weak on the day prior to admission, was noted to have some hypotension when her daughter checked on her but heart rate was in the 80-90s.  She did not take her metoprolol, Eliquis, flecainide 9/29 or 9/30.  Began to have palpitations 9/30 and was evaluated by PCP, found to have A-fib plus RVR and directed to the ED.  Heart rate was in the 150s.  She was given metoprolol x 3, started on amiodarone.  She ultimately converted back to NSR.  While in the ED she developed progressive dyspnea and chest burning, hypoxemia.  Temporarily placed on nitroglycerin infusion in the setting of acute hypertension, was able to come off quickly.  Chest x-ray showed bilateral interstitial infiltrates consistent with acute pulmonary edema.  BiPAP was initiated.  Patient has tolerated BiPAP, diuresis initiated in the ED.  Pertinent  Medical History   Past Medical History:  Diagnosis Date   Allergy    Atrial fibrillation (HCC)    Atrial fibrillation with rapid ventricular response (HCC) 04/10/2017   Bladder tumor 08/06/2022   BMI 28.0-28.9,adult 05/21/2022   Bronchitis 03/07/2022   CHF (congestive heart failure) (HCC)    Chronic anticoagulation 06/10/2019   Eliquis   Chronic diastolic heart failure (HCC)    Chronic obstructive pulmonary disease (HCC) 05/21/2022   Cigarette smoker 05/21/2022   CKD (chronic kidney disease) stage 3, GFR 30-59 ml/min (HCC)    COPD exacerbation (HCC) 04/08/2017   Demand ischemia 09/25/2016   Normal  coronaries Jan 2019 when admitted with AF with RVR and chest pain   Dysrhythmia    a-fib   Elevated troponin    Essential hypertension    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 06/29/2022   GERD (gastroesophageal reflux disease)    HCAP (healthcare-associated pneumonia) 04/10/2017   Heart murmur    History of bladder cancer    Hypercholesterolemia 05/21/2022   Iron deficiency anemia due to chronic blood loss 06/29/2022   Lung nodule    Muscle strain 08/16/2022   Myocardial infarction [facility])    ? 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/21/2022   Renal insufficiency 06/10/2019   New- medications adjusted by her PCP   Seasonal allergies    Sleep apnea    cpap  setting at 2.5 per patient    Stage 3a chronic kidney disease (HCC) 05/21/2022   Symptomatic anemia 08/07/2022   Tobacco abuse     Significant Hospital Events: Including procedures, antibiotic start and stop dates in addition to other pertinent events     Interim History / Subjective:  Patient tolerating BiPAP, feels better, breathing easier.  No palpitations Back in normal sinus rhythm  Objective   Blood pressure 133/77, pulse 80, temperature 97.9 F (36.6 C),  temperature source Oral, resp. rate (!) 33, height 5\' 3"  (1.6 m), weight 58.5 kg, SpO2 100%.    FiO2 (%):  [100 %] 100 %   Intake/Output Summary (Last 24 hours) at 12/24/2022 2215 Last data filed at 12/24/2022 2115 Gross per 24 hour  Intake 533.76 ml  Output --  Net 533.76 ml   Filed Weights   12/24/22 1647  Weight: 58.5 kg    Examination: General: Elderly woman laying in bed in no distress  with BiPAP in place HENT: Oropharynx clear, she is able to phonate around the BiPAP Lungs: Scattered bilateral rhonchi without wheezes Cardiovascular: Regular, 80s Abdomen: Nontender, nondistended Extremities: No edema Neuro: Awake, alert, interacting appropriately, follows commands, well-oriented GU: Deferred  Resolved Hospital Problem list   RVR  Assessment & Plan:   Paroxysmal atrial fibrillation, presenting with RVR. Hypertension -Agree with amiodarone infusion -Restart her scheduled metoprolol and flecainide -Continue irbesartan on 10/1 -Continue home Eliquis -Agree with echocardiogram, cardiology evaluation on 10/1  Acute respiratory failure with hypoxemia due to the above with associated acute on chronic diastolic CHF. History of OSA on CPAP COPD without acute exacerbation -Agree with diuresis, follow I's/O and redose Lasix per blood pressure and renal function -Tolerating and responding well to BiPAP.  Plan to continue at least overnight, attempt to transition off on 10/1 -Follow chest x-ray for clearance of her interstitial infiltrates -She will need CPAP nightly once stabilized and off BiPAP -She is on Symbicort at home, reinitiate scheduled ICS/LABA 10/1.  Albuterol available if needed  Acute on chronic anemia Peripheral neuropathy GERD without esophagitis -As per [facility] plans  Goals of care -Discussed the patient's advanced directives at bedside with her and with her daughter.  Patient has indicated that she wants all aggressive medical care, agrees with BiPAP support.  She would not want to be intubated or mechanically ventilated under any circumstances, does not want CPR or ACLS/defibrillation in the event of pulselessness.  It sounds like she and her daughter would be willing to consider and discuss elective cardioversion if it were ever relevant at any point in the future, but she does not want any emergent shocks.  Best Practice (right click and "Reselect all  SmartList Selections" daily)   Diet/type: Regular consistency (see orders) DVT prophylaxis: DOAC GI prophylaxis: N/A Lines: N/A Foley:  N/A Code Status:  DNR Last date of multidisciplinary goals of care discussion [discussed with the patient and daughter at bedside on 9/30]  Labs   CBC: Recent Labs  Lab 12/24/22 1701  WBC 7.9  NEUTROABS 5.8  HGB 8.4*  HCT 27.8*  MCV 96.5  PLT 157    Basic Metabolic Panel: Recent Labs  Lab 12/24/22 1701  NA 139  K 3.5  CL 105  CO2 25  GLUCOSE 98  BUN 17  CREATININE 0.87  CALCIUM 8.8*  MG 1.9   GFR: Estimated Creatinine Clearance: 45.5 mL/min (by C-G formula based on SCr of 0.87 mg/dL). Recent Labs  Lab 12/24/22 1701  WBC 7.9    Liver Function Tests: Recent Labs  Lab 12/24/22 1701  AST 13*  ALT 11  ALKPHOS 70  BILITOT 0.7  PROT 7.0  ALBUMIN 2.9*   No results for input(s): "LIPASE", "AMYLASE" in the last 168 hours. No results for input(s): "AMMONIA" in the last 168 hours.  ABG No results found for: "PHART", "PCO2ART", "PO2ART", "HCO3", "TCO2", "ACIDBASEDEF", "O2SAT"   Coagulation Profile: No results for input(s): "INR", "PROTIME" in the last 168 hours.  Cardiac Enzymes: No  results for input(s): "CKTOTAL", "CKMB", "CKMBINDEX", "TROPONINI" in the last 168 hours.  HbA1C: No results found for: "HGBA1C"  CBG: No results for input(s): "GLUCAP" in the last 168 hours.  Review of Systems:   As per HPI  Past Medical History:  She,  has a past medical history of Allergy, Atrial fibrillation (HCC), Atrial fibrillation with rapid ventricular response (HCC) (04/10/2017), Bladder tumor (08/06/2022), BMI 28.0-28.9,adult (05/21/2022), Bronchitis (03/07/2022), CHF (congestive heart failure) (HCC), Chronic anticoagulation (06/10/2019), Chronic diastolic heart failure (HCC), Chronic obstructive pulmonary disease (HCC) (05/21/2022), Cigarette smoker (05/21/2022), CKD (chronic kidney disease) stage 3, GFR 30-59 ml/min (HCC), COPD  exacerbation (HCC) (04/08/2017), Demand ischemia (09/25/2016), Dysrhythmia, Elevated troponin, Essential hypertension, 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 (06/29/2022), GERD (gastroesophageal reflux disease), HCAP (healthcare-associated pneumonia) (04/10/2017), Heart murmur, History of bladder cancer, Hypercholesterolemia (05/21/2022), Iron deficiency anemia due to chronic blood loss (06/29/2022), Lung nodule, Muscle strain (08/16/2022), Myocardial infarction [facility]), Neck pain (08/16/2022), Non-ST elevation (NSTEMI) myocardial infarction (HCC) (04/07/2017), OSA on CPAP (04/08/2017), Osteopenia (05/21/2022), Osteoporosis, PAF (paroxysmal atrial fibrillation) (HCC) (09/24/2016), Precordial pain (10/17/2015), Primary osteoarthritis involving multiple joints (05/21/2022), Renal insufficiency (06/10/2019), Seasonal allergies, Sleep apnea, Stage 3a chronic kidney disease (HCC) (05/21/2022), Symptomatic anemia (08/07/2022), and Tobacco abuse.   Surgical History:   Past Surgical History:  Procedure Laterality Date   ABDOMINAL HYSTERECTOMY     BLADDER SURGERY     COLONOSCOPY N/A 12/28/2016   Procedure: COLONOSCOPY;  Surgeon: Sherrilyn Rist, MD;  Location: [facility] ENDOSCOPY;  Service: Gastroenterology;  Laterality: N/A;   LEFT HEART CATH AND CORONARY ANGIOGRAPHY N/A 04/09/2017   Procedure: LEFT HEART CATH AND CORONARY ANGIOGRAPHY;  Surgeon: Corky Crafts, MD;  Location: [facility] INVASIVE CV LAB;  Service: Cardiovascular;  Laterality: N/A;   MOUTH SURGERY     POLYPECTOMY N/A 12/28/2016   Procedure: POLYPECTOMY with injection of [place]vue;  Surgeon: Sherrilyn Rist, MD;  Location: [facility] ENDOSCOPY;  Service: Gastroenterology;  Laterality: N/A;   TRANSURETHRAL RESECTION OF BLADDER TUMOR N/A 08/09/2022   Procedure: TRANSURETHRAL RESECTION OF BLADDER TUMOR (TURBT) BILATERAL RETROGRADES;  Surgeon: Loletta Parish., MD;  Location: [facility] ORS;  Service: Urology;  Laterality: N/A;    TRANSURETHRAL RESECTION OF BLADDER TUMOR N/A 10/05/2022   Procedure: RESTAGING TRANSURETHRAL RESECTION OF BLADDER TUMOR (TURBT), CYSTOSCOPY, BILATERAL RETROGRADE PYELOGRAM;  Surgeon: Loletta Parish., MD;  Location: [facility] ORS;  Service: Urology;  Laterality: N/A;  1 HR     Social History:   reports that she has been smoking cigarettes. She has a 26 pack-year smoking history. She has never used smokeless tobacco. She reports that she does not drink alcohol and does not use drugs.   Family History:  Her family history includes Arrhythmia in her father; Bladder Cancer in her brother; CAD in her father; Cancer in her father; Heart disease in her father; Hypertension in her mother; Lung cancer in her brother; Stroke in her mother. There is no history of Colon cancer, Esophageal cancer, Stomach cancer, or Colon polyps.   Allergies Allergies  Allergen Reactions   Albuterol Palpitations   Lisinopril Cough     Home Medications  Prior to Admission medications   Medication Sig Start Date End Date Taking? Authorizing Provider  acetaminophen (TYLENOL) 500 MG tablet Take 500 mg by mouth every 6 (six) hours as needed for moderate pain or headache.   Yes [provider]  apixaban (ELIQUIS) 5 MG TABS tablet Take 1 tablet (5 mg total) by mouth 2 (two) times daily. 08/12/22  Yes Azucena Fallen, MD  benzonatate (TESSALON) 100 MG capsule Take 200 mg by mouth 3 (three) times daily as needed. 11/24/22  Yes [provider]  docusate sodium (COLACE) 100 MG capsule Take 100 mg by mouth daily.   Yes [provider]  Ferric Maltol 30 MG CAPS Take 1 capsule (30 mg total) by mouth daily. 12/24/22  Yes Crist Fat, MD  flecainide (TAMBOCOR) 50 MG tablet Take 1 tablet (50 mg total) by mouth 2 (two) times daily. 07/25/22  Yes Baldo Daub, MD  fluticasone (FLONASE) 50 MCG/ACT nasal spray Place 2 sprays into both nostrils daily. 03/07/22  Yes Crist Fat, MD  levalbuterol [facility] HFA)  45 MCG/ACT inhaler Inhale 1-2 puffs into the lungs every 4 (four) hours as needed for wheezing. Patient taking differently: Inhale 1-2 puffs into the lungs 2 (two) times daily. 05/21/22  Yes Crist Fat, MD  metoprolol succinate (TOPROL-XL) 25 MG 24 hr tablet TAKE ONE TABLET BY MOUTH TWICE DAILY WITH OR immediately following A meal 08/10/22  Yes Crist Fat, MD  montelukast (SINGULAIR) 10 MG tablet Take 1 tablet (10 mg total) by mouth every evening. 08/06/22  Yes Crist Fat, MD  pantoprazole (PROTONIX) 40 MG tablet Take 1 tablet (40 mg total) by mouth daily. 10/08/22  Yes Crist Fat, MD  Psyllium (VEGETABLE LAXATIVE PO) Take 1 tablet by mouth daily.   Yes [provider]  rosuvastatin (CRESTOR) 5 MG tablet Take 5 mg by mouth every other day. In the morning   Yes [provider]  SYMBICORT 160-4.5 MCG/ACT inhaler Inhale 2 puffs into the lungs 2 (two) times daily. 05/21/22  Yes Crist Fat, MD  valsartan (DIOVAN) 160 MG tablet Take 1 tablet (160 mg total) by mouth daily. 07/25/22  Yes Baldo Daub, MD  pregabalin (LYRICA) 50 MG capsule Take 1 capsule (50 mg total) by mouth 2 (two) times daily. Patient not taking: Reported on 12/24/2022 08/16/22   Crist Fat, MD     Critical care time: 35 minutes     Levy Pupa, MD, PhD 12/24/2022, 10:31 PM [facility] [phone] or if no answer before 7:00PM call [phone] For any issues after 7:00PM please call eLink [phone]

## 2022-12-24 NOTE — Assessment & Plan Note (Addendum)
Continue with pantoprazole/.  

## 2022-12-24 NOTE — Assessment & Plan Note (Addendum)
Continue with pregabalin.

## 2022-12-25 ENCOUNTER — Encounter (HOSPITAL_COMMUNITY): Payer: Self-pay | Admitting: Family Medicine

## 2022-12-25 ENCOUNTER — Inpatient Hospital Stay (HOSPITAL_COMMUNITY): Payer: Medicare Other

## 2022-12-25 DIAGNOSIS — I5031 Acute diastolic (congestive) heart failure: Secondary | ICD-10-CM | POA: Diagnosis not present

## 2022-12-25 DIAGNOSIS — J810 Acute pulmonary edema: Secondary | ICD-10-CM | POA: Diagnosis not present

## 2022-12-25 DIAGNOSIS — I5033 Acute on chronic diastolic (congestive) heart failure: Secondary | ICD-10-CM | POA: Diagnosis not present

## 2022-12-25 DIAGNOSIS — J441 Chronic obstructive pulmonary disease with (acute) exacerbation: Secondary | ICD-10-CM | POA: Diagnosis not present

## 2022-12-25 DIAGNOSIS — I4891 Unspecified atrial fibrillation: Secondary | ICD-10-CM | POA: Diagnosis not present

## 2022-12-25 DIAGNOSIS — J9601 Acute respiratory failure with hypoxia: Secondary | ICD-10-CM | POA: Diagnosis not present

## 2022-12-25 DIAGNOSIS — I480 Paroxysmal atrial fibrillation: Secondary | ICD-10-CM | POA: Diagnosis not present

## 2022-12-25 LAB — ECHOCARDIOGRAM COMPLETE
AR max vel: 2.54 cm2
AV Area VTI: 2.45 cm2
AV Area mean vel: 2.3 cm2
AV Mean grad: 9 mm[Hg]
AV Peak grad: 16.5 mm[Hg]
Ao pk vel: 2.03 m/s
Area-P 1/2: 2.79 cm2
Height: 63 in
P 1/2 time: 270 ms
S' Lateral: 2.5 cm
Weight: 2081.14 [oz_av]

## 2022-12-25 LAB — CBC
HCT: 23.7 % — ABNORMAL LOW (ref 36.0–46.0)
Hemoglobin: 7.1 g/dL — ABNORMAL LOW (ref 12.0–15.0)
MCH: 29.2 pg (ref 26.0–34.0)
MCHC: 30 g/dL (ref 30.0–36.0)
MCV: 97.5 fL (ref 80.0–100.0)
Platelets: 137 10*3/uL — ABNORMAL LOW (ref 150–400)
RBC: 2.43 MIL/uL — ABNORMAL LOW (ref 3.87–5.11)
RDW: 19.9 % — ABNORMAL HIGH (ref 11.5–15.5)
WBC: 10.5 10*3/uL (ref 4.0–10.5)
nRBC: 0 % (ref 0.0–0.2)

## 2022-12-25 LAB — RETICULOCYTES
Immature Retic Fract: 33.3 % — ABNORMAL HIGH (ref 2.3–15.9)
RBC.: 2.51 MIL/uL — ABNORMAL LOW (ref 3.87–5.11)
Retic Count, Absolute: 78.1 10*3/uL (ref 19.0–186.0)
Retic Ct Pct: 3.1 % (ref 0.4–3.1)

## 2022-12-25 LAB — BASIC METABOLIC PANEL
Anion gap: 8 (ref 5–15)
BUN: 18 mg/dL (ref 8–23)
CO2: 23 mmol/L (ref 22–32)
Calcium: 8.2 mg/dL — ABNORMAL LOW (ref 8.9–10.3)
Chloride: 106 mmol/L (ref 98–111)
Creatinine, Ser: 0.88 mg/dL (ref 0.44–1.00)
GFR, Estimated: >60 mL/min (ref >60)
Glucose, Bld: 124 mg/dL — ABNORMAL HIGH (ref 70–99)
Potassium: 3.7 mmol/L (ref 3.5–5.1)
Sodium: 137 mmol/L (ref 135–145)

## 2022-12-25 LAB — IRON AND TIBC
Iron: 46 ug/dL (ref 28–170)
Saturation Ratios: 22 % (ref 10.4–31.8)
TIBC: 209 ug/dL — ABNORMAL LOW (ref 250–450)
UIBC: 163 ug/dL

## 2022-12-25 LAB — HEMOGLOBIN AND HEMATOCRIT, BLOOD
HCT: 26.8 % — ABNORMAL LOW (ref 36.0–46.0)
Hemoglobin: 8.2 g/dL — ABNORMAL LOW (ref 12.0–15.0)

## 2022-12-25 LAB — PREPARE RBC (CROSSMATCH)

## 2022-12-25 LAB — MRSA NEXT GEN BY PCR, NASAL: MRSA by PCR Next Gen: NOT DETECTED

## 2022-12-25 LAB — VITAMIN B12: Vitamin B-12: 378 pg/mL (ref 180–914)

## 2022-12-25 LAB — FOLATE: Folate: 15.5 ng/mL (ref >5.9)

## 2022-12-25 LAB — FERRITIN: Ferritin: 562 ng/mL — ABNORMAL HIGH (ref 11–307)

## 2022-12-25 MED ORDER — METOPROLOL SUCCINATE ER 25 MG PO TB24
12.5000 mg | ORAL_TABLET | Freq: Every day | ORAL | Status: DC
  Administered 2022-12-26 – 2022-12-27 (×2): 12.5 mg via ORAL
  Filled 2022-12-25 (×2): qty 1

## 2022-12-25 MED ORDER — ORAL CARE MOUTH RINSE: 15.0000 mL | OROMUCOSAL | Status: DC | PRN

## 2022-12-25 MED ORDER — BUDESONIDE 0.5 MG/2ML IN SUSP
0.5000 mg | Freq: Two times a day (BID) | RESPIRATORY_TRACT | Status: DC
  Administered 2022-12-25 – 2023-01-06 (×24): 0.5 mg via RESPIRATORY_TRACT
  Filled 2022-12-25 (×24): qty 2

## 2022-12-25 MED ORDER — SODIUM CHLORIDE 0.9% IV SOLUTION: Freq: Once | INTRAVENOUS | Status: AC

## 2022-12-25 NOTE — Progress Notes (Signed)
   12/25/22 2258  BiPAP/CPAP/SIPAP  BiPAP/CPAP/SIPAP Pt Type Adult  BiPAP/CPAP/SIPAP Resmed  Mask Type Full face mask (mask from home)  Mask Size Medium (home mask and cpap machine)  Respiratory Rate 25 breaths/min  EPAP 10 cmH2O  Flow Rate 2 lpm  Patient Home Equipment Yes  Auto Titrate No  Safety Check Completed by RT for Home Unit Yes, no issues noted (no frays on cord noted, machine plugged into red outlet)  BiPAP/CPAP /SiPAP Vitals  Pulse Rate 89  Resp (!) 25

## 2022-12-25 NOTE — Progress Notes (Signed)
eLink Physician-Brief Progress Note Patient Name: Dana Horton DOB: 1945-06-20 MRN: [id number]   Date of Service  12/25/2022  HPI/Events of Note  77 year old woman with a history of tobacco use, atrial fibrillation with prior RVR, hypertension, diastolic CHF, COPD, CKD stage IIIa, GERD, bladder cancer, hyperlipidemia.  She presents with paroxysmal atrial fibrillation with rapid ventricular rhythm and associated hypertension.  Patient presented with rapid ventricular response up to the 150s.  She was started on amiodarone drip and BiPAP with improvement in heart rate to the 70s.  Initial results consistent with hypokalemia minimal hypomagnesemia and elevated BNP.  Normocytic anemia.  Right greater than left opacities noted with small right pleural effusion overlying hyperinflation.  Latest ECG with sinus rhythm.    eICU Interventions  Maintain amiodarone per order.  Maintain metoprolol scheduled.  Flecainide as scheduled.  Continue diuresis, nitroglycerin as needed  DNR/DNI noted.  DVT prophylaxis with apixaban.  GI prophylaxis not indicated, resume home Protonix     Intervention Category Evaluation Type: New Patient Evaluation  [name] 12/25/2022, 12:03 AM

## 2022-12-25 NOTE — Progress Notes (Signed)
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  Patient's daughter who lives across the street from her checked on her and noted that she was having low blood pressure in 80s with heart rate in 80s to 90s.  She was scheduled to have iron infusion but was noted to be in atrial fibrillation with RVR HR 140s to 160s, felt short of breath and was sent to the emergency room. In ED, heart rate up to 167, BP 96/63 and was noted to be in respiratory distress.  Patient was initially on 2 L O2 via [place] however subsequently placed on BiPAP. She was admitted to stepdown unit, pulmonology was consulted.   Assessment & Plan    Principal Problem:   Acute respiratory failure with hypoxia (HCC), multifactorial likely secondary to A-fib with RVR, acute on chronic diastolic CHF, has underlying COPD -Overnight was placed on BiPAP, started on IV amiodarone drip, received Lasix 20 mg IV x 1 -This morning, on encounter, BiPAP off, noted to be on 2 L O2 via [place], BP borderline and patient has converted back to normal sinus rhythm with HR in 60s. -Continue to wean O2 as tolerated  Active Problems:  Paroxysmal atrial fibrillation with RVR (HCC) -Reportedly had not taken metoprolol, Eliquis, flecainide on 9/29 or 9/30 due to concern about low BP. -Started on amiodarone infusion overnight, patient has converted to normal sinus rhythm and heart rate now controlled in 60s -Currently  on flecainide 50 mg twice daily, borderline low BP, on metoprolol  -Cardiology consulted, will follow recommendations    Acute on chronic diastolic CHF (congestive heart failure) (HCC) -2D echo this morning showed EF of 65 to 70%, moderate asymmetric left ventricular hypertrophy of the basal septal segment, G3 DD, normal right ventricular systolic function, left atrium severely dilated, severe MR, moderate aortic valve regurgitation. -BNP 1180, chest x-ray with interstitial and airspace opacities concerning for pulmonary edema -Received IV Lasix 20 mg in ED x 1, cardiology consulted, continue IV Lasix as BP allows    Essential hypertension -Borderline BP, decrease metoprolol -Will follow cardiology recommendations  Acute on chronic normocytic anemia, iron deficiency -Baseline hemoglobin 8-9.  Anemia panel in 08/2022 had shown low iron and % sat.  Previous in 2018 EGD normal and colonoscopy had shown diverticulosis and polyps. -Presented with hemoglobin of 8.4 --> 7.1 today -No reports of hematemesis, hematochezia or melena, will check FOBT, anemia panel, transfuse 1 unit packed RBCs -For now, continue apixaban however if FOBT positive, will hold Eliquis and  consult GI  GERD Continue Protonix   CKD stage 3a -Baseline creatinine 0.8-0.9 -Currently at baseline  OSA -Continue CPAP nightly   Estimated body mass index is 23.04 kg/m as calculated from the following:   Height as of this encounter: 5\' 3"  (1.6 m).   Weight as of this encounter: 59 kg.  Code Status:  DVT Prophylaxis:   apixaban (ELIQUIS) tablet 5 mg   Level of Care: Level of care: Stepdown Family Communication: Updated patient Disposition Plan:      Remains inpatient appropriate:      Procedures:  2D echo  Consultants:   Cardiology PCCM  Antimicrobials:   Anti-infectives (From admission, onward)    None          Medications  apixaban  5 mg Oral BID   Chlorhexidine Gluconate Cloth  6 each Topical  Daily   docusate sodium  100 mg Oral Daily   ferrous sulfate  325 mg Oral Q breakfast   flecainide  50 mg Oral BID   fluticasone  2 spray Each Nare Daily   furosemide  40 mg Intravenous BID   irbesartan  150 mg Oral Daily   levalbuterol  0.63 mg Nebulization BID   metoprolol succinate  25 mg Oral Daily   montelukast  10 mg Oral QPM   pantoprazole  40 mg Oral Daily   pregabalin  50 mg Oral BID   rosuvastatin  5 mg Oral QODAY      Subjective:   Dana Horton was seen and examined today.  Seen this morning, off the BiPAP, heart rate in the 60s, BP  borderline in 90s.  Converted to sinus rhythm.  On 3 L O2 via [place].  Shortness of breath is improving.  No chest pain, nausea, vomiting, abdominal pain, any hematemesis, hematochezia or melena.  Objective:   Vitals:   12/25/22 0420 12/25/22 0500 12/25/22 0700 12/25/22 0730  BP:   (!) 109/29 (!) 100/32  Pulse:   60 63  Resp:   20 20  Temp: 97.9 F (36.6 C)     TempSrc: Axillary     SpO2:   100% 100%  Weight:  59 kg    Height:        Intake/Output Summary (Last 24 hours) at 12/25/2022 0805 Last data filed at 12/24/2022 2341 Gross per 24 hour  Intake 610.63 ml  Output --  Net 610.63 ml     Wt Readings from Last 3 Encounters:  12/25/22 59 kg  12/24/22 58.8 kg  12/03/22 60.2 kg     Exam General: Alert and oriented x 3, NAD, ill-appearing Cardiovascular: S1 S2 auscultated,  RRR Respiratory: Diminished breath sound at the bases Gastrointestinal: Soft, nontender, nondistended, + bowel sounds Ext: no pedal edema bilaterally Neuro: no new deficits Psych: Normal affect     Data Reviewed:  I have personally reviewed following labs    CBC Lab Results  Component Value Date   WBC 10.5 12/25/2022   RBC 2.43 (L) 12/25/2022   HGB 7.1 (L) 12/25/2022   HCT 23.7 (L) 12/25/2022   MCV 97.5 12/25/2022   MCH 29.2 12/25/2022   PLT 137 (L) 12/25/2022   MCHC 30.0 12/25/2022   RDW 19.9 (H) 12/25/2022   LYMPHSABS 1.6  12/24/2022   MONOABS 0.5 12/24/2022   EOSABS 0.0 12/24/2022   BASOSABS 0.0 12/24/2022     Last metabolic panel Lab Results  Component Value Date   NA 137 12/25/2022   K 3.7 12/25/2022   CL 106  12/25/2022   CO2 23 12/25/2022   BUN 18 12/25/2022   CREATININE 0.88 12/25/2022   GLUCOSE 124 (H) 12/25/2022   GFRNONAA >60 12/25/2022   GFRAA 71 07/21/2019   CALCIUM 8.2 (L) 12/25/2022   PROT 7.0 12/24/2022   ALBUMIN 2.9 (L) 12/24/2022   LABGLOB 2.6 05/21/2022   AGRATIO 1.6 05/21/2022   BILITOT 0.7 12/24/2022   ALKPHOS 70 12/24/2022   AST 13 (L) 12/24/2022   ALT 11 12/24/2022   ANIONGAP 8 12/25/2022    CBG (last 3)  No results for input(s): "GLUCAP" in the last 72 hours.    Coagulation Profile: No results for input(s): "INR", "PROTIME" in the last 168 hours.   Radiology Studies: I have personally reviewed the imaging studies  DG Chest Port 1 View  Result Date: 12/24/2022 CLINICAL DATA:  Shortness of breath EXAM: PORTABLE CHEST 1 VIEW COMPARISON:  12/24/2022 FINDINGS: Heart and mediastinal contours are within normal limits. Diffuse interstitial and airspace opacities throughout the lungs. Focal opacity in the right lower lobe. No effusions. No acute bony abnormality. IMPRESSION: Diffuse interstitial and airspace opacities concerning for edema. Focal airspace opacity in the right lower lobe could reflect asymmetric edema or infection. Electronically Signed   By: Charlett Nose M.D.   On: 12/24/2022 22:22   DG Chest Portable 1 View  Result Date: 12/24/2022 CLINICAL DATA:  Shortness of breath EXAM: PORTABLE CHEST 1 VIEW COMPARISON:  09/05/2022 FINDINGS: There is hyperinflation of the lungs compatible with COPD. Heart and mediastinal contours within normal limits. Aortic atherosclerosis. Small right pleural effusion. Right basilar opacity could reflect atelectasis or infiltrate. Left lung clear. No acute bony abnormality. IMPRESSION: Hyperinflation/COPD. Small right pleural effusion with  right base atelectasis or infiltrate. Electronically Signed   By: Charlett Nose M.D.   On: 12/24/2022 20:00       Keyona Emrich M.D. [facility] 12/25/2022, 8:05 AM  Available via Epic secure chat 7am-7pm After 7 pm, please refer to night coverage provider listed on amion.

## 2022-12-25 NOTE — Progress Notes (Signed)
NAME:  Dana Horton, MRN:  [id number], DOB:  Feb 08, 1946, LOS: 1 ADMISSION DATE:  12/24/2022, CONSULTATION DATE: 12/24/2022 REFERRING MD:  Dr Elayne Snare, CHIEF COMPLAINT: Hypoxemia  History of Present Illness:  77 year old woman with a history of tobacco use, atrial fibrillation with prior RVR, hypertension, diastolic CHF, COPD, CKD stage IIIa, GERD, bladder cancer, hyperlipidemia.  Per her daughter at bedside she was treated for pneumonia 1 month ago. She began to feel weak on the day prior to admission, was noted to have some hypotension when her daughter checked on her but heart rate was in the 80-90s.  She did not take her metoprolol, Eliquis, flecainide 9/29 or 9/30.  Began to have palpitations 9/30 and was evaluated by PCP, found to have A-fib plus RVR and directed to the ED.  Heart rate was in the 150s.  She was given metoprolol x 3, started on amiodarone.  She ultimately converted back to NSR.  While in the ED she developed progressive dyspnea and chest burning, hypoxemia.  Temporarily placed on nitroglycerin infusion in the setting of acute hypertension, was able to come off quickly.  Chest x-ray showed bilateral interstitial infiltrates consistent with acute pulmonary edema.  BiPAP was initiated.  Patient has tolerated BiPAP, diuresis initiated in the ED.  Pertinent  Medical History   Past Medical History:  Diagnosis Date   Allergy    Atrial fibrillation (HCC)    Atrial fibrillation with rapid ventricular response (HCC) 04/10/2017   Bladder tumor 08/06/2022   BMI 28.0-28.9,adult 05/21/2022   Bronchitis 03/07/2022   CHF (congestive heart failure) (HCC)    Chronic anticoagulation 06/10/2019   Eliquis   Chronic diastolic heart failure (HCC)    Chronic obstructive pulmonary disease (HCC) 05/21/2022   Cigarette smoker 05/21/2022   CKD (chronic kidney disease) stage 3, GFR 30-59 ml/min (HCC)    COPD exacerbation (HCC) 04/08/2017   Demand ischemia 09/25/2016   Normal  coronaries Jan 2019 when admitted with AF with RVR and chest pain   Dysrhythmia    a-fib   Elevated troponin    Essential hypertension    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 06/29/2022   GERD (gastroesophageal reflux disease)    HCAP (healthcare-associated pneumonia) 04/10/2017   Heart murmur    History of bladder cancer    Hypercholesterolemia 05/21/2022   Iron deficiency anemia due to chronic blood loss 06/29/2022   Lung nodule    Muscle strain 08/16/2022   Myocardial infarction [facility])    ? 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/21/2022   Renal insufficiency 06/10/2019   New- medications adjusted by her PCP   Seasonal allergies    Sleep apnea    cpap  setting at 2.5 per patient    Stage 3a chronic kidney disease (HCC) 05/21/2022   Symptomatic anemia 08/07/2022   Tobacco abuse     Significant Hospital Events: Including procedures, antibiotic start and stop dates in addition to other pertinent events   9/29 admitted on BiPAP, amiodarone drip  Interim History / Subjective:   Converted to sinus rhythm Came off BiPAP this morning, on 3 L nasal cannula Denies chest pain Breathing is much improved  Objective  Blood pressure (!) 102/33, pulse 63, temperature 98.3 F (36.8 C), temperature source Oral, resp. rate (!) 22, height 5\' 3"  (1.6 m), weight 59 kg, SpO2 (!) 2%.    FiO2 (%):  [40 %-100 %] 40 %   Intake/Output Summary (Last 24 hours) at 12/25/2022 0930 Last data filed at 12/25/2022 0800 Gross per 24 hour  Intake 749.18 ml  Output --  Net 749.18 ml   Filed Weights   12/24/22 1647 12/25/22 0500   Weight: 58.5 kg 59 kg    Examination: General: Elderly woman sitting up in bed, no distress HENT: Oropharynx clear, clear speech, no pallor, icterus Lungs: Crackles right base, no accessory muscle use Cardiovascular: S1 1, S2 regular Abdomen: Nontender, nondistended Extremities: No edema Neuro: Awake, alert, interacting appropriately, nonfocal, well-oriented GU: Deferred  Labs show mild hypokalemia 3.7, no leukocytosis, drop in hemoglobin from 8.4-7.1, mild thrombocytopenia  Resolved Hospital Problem list   RVR  Assessment & Plan:   Paroxysmal atrial fibrillation, presenting with RVR-converted to sinus rhythm Hypertension -off amiodarone infusion -Restart her scheduled metoprolol and flecainide -Continue irbesartan on 10/1 -Continue home Eliquis -Await echocardiogram, cardiology evaluation   Acute respiratory failure with hypoxemia due to acute on chronic diastolic CHF. History of OSA on CPAP COPD without acute exacerbation -Diuresis as blood pressure permits -Use nocturnal CPAP -She is on Symbicort at home, use budesonide and Xopenex instead during hospital stay  Acute on chronic anemia -hemoglobin has dropped from 9.7-7.1 Peripheral neuropathy GERD without esophagitis -As per [facility] plans , may have to hold Eliquis  Goals of care discussion on 9/30 - Patient indicated that she wants all aggressive medical care, agrees with BiPAP support.  She would not want to be intubated or mechanically ventilated under any circumstances, does not want CPR or ACLS/defibrillation in the event of pulselessness.  It sounds like she and her daughter would be willing to consider and discuss elective cardioversion if it were ever relevant at any point in the future, but she does not want any emergent shocks.  PCCM will be available as needed  Best Practice (right click and "Reselect all SmartList Selections" daily)   Diet/type: Regular consistency (see orders) DVT prophylaxis: DOAC GI  prophylaxis: N/A Lines: N/A Foley:  N/A Code Status:  DNR Last date of multidisciplinary goals of care discussion [discussed with the patient and daughter at bedside on 9/30]  Labs   CBC: Recent Labs  Lab 12/24/22 1701 12/25/22 0308  WBC 7.9 10.5  NEUTROABS 5.8  --   HGB 8.4* 7.1*  HCT 27.8* 23.7*  MCV 96.5 97.5  PLT 157 137*    Basic Metabolic Panel: Recent Labs  Lab 12/24/22 1701 12/25/22 0308  NA 139 137  K 3.5 3.7  CL 105 106  CO2 25 23  GLUCOSE 98 124*  BUN 17 18  CREATININE 0.87 0.88  CALCIUM 8.8* 8.2*  MG 1.9  --    GFR: Estimated Creatinine Clearance: 45 mL/min (by C-G formula based on SCr of 0.88 mg/dL). Recent Labs  Lab 12/24/22 1701 12/25/22 0308  WBC 7.9 10.5    Liver Function Tests: Recent Labs  Lab 12/24/22 1701  AST 13*  ALT 11  ALKPHOS 70  BILITOT 0.7  PROT 7.0  ALBUMIN 2.9*   No results for input(s): "LIPASE", "AMYLASE" in the last 168 hours. No results for input(s): "AMMONIA" in the last 168 hours.  ABG No results found for: "PHART", "PCO2ART", "PO2ART", "HCO3", "TCO2", "ACIDBASEDEF", "O2SAT"   Coagulation Profile: No results for  input(s): "INR", "PROTIME" in the last 168 hours.  Cardiac Enzymes: No results for input(s): "CKTOTAL", "CKMB", "CKMBINDEX", "TROPONINI" in the last 168 hours.  HbA1C: No results found for: "HGBA1C"  CBG: No results for input(s): "GLUCAP" in the last 168 hours.   Cyril Mourning MD. Tonny Bollman. [facility] Pager : [phone]  If no response to pager , please call [phone] until 7 pm After 7:00 pm call Elink  [phone]   12/25/2022

## 2022-12-25 NOTE — Plan of Care (Signed)
  Problem: Cardiac: Goal: Ability to achieve and maintain adequate cardiopulmonary perfusion will improve Outcome: Progressing   Problem: Education: Goal: Knowledge of General Education information will improve Description: Including pain rating scale, medication(s)/side effects and non-pharmacologic comfort measures Outcome: Progressing   Problem: Clinical Measurements: Goal: Ability to maintain clinical measurements within normal limits will improve Outcome: Progressing Goal: Respiratory complications will improve Outcome: Progressing

## 2022-12-25 NOTE — Progress Notes (Signed)
Pt seen and given scheduled nebulizer treatment which she tolerated well.  HR 87, RR21, spo2 94% on 2l [place].  No increase wob / respiratory distress noted.  Pt does not feel bipap is needed at this time.  Bipap remains in room on standby if needed.

## 2022-12-25 NOTE — Consult Note (Addendum)
Last 24 hours) at 12/25/2022 1238 Last data filed at 12/25/2022 0800 Gross per 24 hour  Intake 749.18 ml  Output --  Net 749.18 ml      12/25/2022    5:00 AM 12/24/2022    4:47 PM 12/24/2022    1:39 PM  Last 3 Weights  Weight (lbs) 130 lb 1.1 oz 129 lb 129 lb 9.6 oz  Weight (kg) 59 kg 58.514 kg 58.786 kg     Body mass index is 23.04 kg/m.  General:  Well nourished, well developed. Elderly female, sitting upright in  the bed wearing [place]  HEENT: normal Neck: no JVD Vascular: Radial pulses 2+ bilaterally Cardiac:  normal S1, S2; RRR; no murmur  Lungs:  crackles in bilateral lung bases  Abd: soft, nontender Ext: no edema in BLE  Musculoskeletal:  No deformities, BUE and BLE strength normal and equal Skin: warm and dry  Neuro:  CNs 2-12 intact, no focal abnormalities noted Psych:  Normal affect   EKG:  The EKG was personally reviewed and demonstrates:  EKG in the ED showed atrial fibrillation, HR 146 BPM.   Telemetry:  Telemetry was personally reviewed and demonstrates:  NSR, HR in the 60s-70s   Relevant CV Studies:   Laboratory Data:  High Sensitivity Troponin:   Recent Labs  Lab 12/24/22 1701 12/24/22 2015  TROPONINIHS 11 13     Chemistry Recent Labs  Lab 12/24/22 1701 12/25/22 0308  NA 139 137  K 3.5 3.7  CL 105 106  CO2 25 23  GLUCOSE 98 124*  BUN 17 18  CREATININE 0.87 0.88  CALCIUM 8.8* 8.2*  MG 1.9  --   GFRNONAA >60 >60  ANIONGAP 9 8    Recent Labs  Lab 12/24/22 1701  PROT 7.0  ALBUMIN 2.9*  AST 13*  ALT 11  ALKPHOS 70  BILITOT 0.7   Lipids No results for input(s): "CHOL", "TRIG", "HDL", "LABVLDL", "LDLCALC", "CHOLHDL" in the last 168 hours.  Hematology Recent Labs  Lab 12/24/22 1701 12/25/22 0308  WBC 7.9 10.5  RBC 2.88* 2.43*  HGB 8.4* 7.1*  HCT 27.8* 23.7*  MCV 96.5 97.5  MCH 29.2 29.2  MCHC 30.2 30.0  RDW 19.9* 19.9*  PLT 157 137*   Thyroid No results for input(s): "TSH", "FREET4" in the last 168 hours.  BNP Recent Labs  Lab 12/24/22 1701  BNP 1,180.3*    DDimer No results for input(s): "DDIMER" in the last 168 hours.   Radiology/Studies:  ECHOCARDIOGRAM COMPLETE  Result Date: 12/25/2022    ECHOCARDIOGRAM REPORT   Patient Name:   Dana Horton Date of Exam: 12/25/2022 Medical Rec #:  [id number]                     Height:       63.0 in Accession #:    [id number]                    Weight:       130.1 lb Date of Birth:   [birth year]                    BSA:          1.611 m Patient Age:    77 years                      BP:           102/33 mmHg Patient Gender: F  Cardiology Consultation   Patient ID: Dana Horton MRN: [id number]; DOB: April 01, 1945  Admit date: 12/24/2022 Date of Consult: 12/25/2022  PCP:  Crist Fat, MD   [facility] Providers Cardiologist:  Norman Herrlich, MD    Patient Profile:   Dana Horton is a 77 y.o. female with a hx of atrial fibrillation, HFpEF, HTN, tobacco use, COPD, CKD stage IIIa, GERD, bladder cancer, HLD who is being seen 12/25/2022 for the evaluation of atrial fibrillation, CHF at the request of Dr. Isidoro Donning.  History of Present Illness:   Dana Horton is a 77 year old female with above medical history who is followed by Dana Horton, previously followed by Dr. Duke Salvia.  Per chart review, patient was initially seen by cardiology in 10/2016 for evaluation of chest pain.  She saw her PCP on 09/13/2016 and reported chest pain, EKG at that appointment showed atrial fibrillation with RVR.  Patient was admitted to [facility] and she converted to sinus rhythm when her IV was inserted.  Echocardiogram on 09/14/16 showed EF 60-65%, mild mitral and tricuspid regurgitation.  Patient was started on metoprolol and full-strength aspirin.  While on full-strength aspirin, patient developed hematochezia.  Reportedly patient had a history of developing hematochezia on full-strength aspirin or NSAIDs.  She reported fatigue and metoprolol was switched to nightly dosing.  She was referred to GI for evaluation of hematochezia.  Underwent upper endoscopy that was normal.  Also underwent colonoscopy that showed multiple polyps.  It was thought that her bleeding was due to intermittent benign anal bleeding.  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  Also found to have internal hemorrhoids.  She was cleared to start anticoagulation 2 weeks following colonoscopy.  Patient was admitted in 03/2017 with A-fib with RVR.  She was  started on IV amiodarone and converted back to rhythm.  Her troponins were elevated.  She underwent left heart catheterization on 04/09/2017 that showed no angiographically apparent CAD.  She was treated with flecainide for her A-fib.She saw Dr. Duke Salvia in clinic on 10/28/2019 and was doing well on a regiment of flecainide, metoprolol, Eliquis.  She was also on amlodipine, valsartan, furosemide for blood pressure management.    Patient was lost to cardiology follow-up until she reestablished care with Dana Horton on 07/25/2022 for preoperative evaluation.  Patient had been seen at [facility] in 05/2022 with low blood pressure, episodes of hematuria.  Found to be anemic with hemoglobin 9.7.  Patient also had evidence of GI bleed.  [facility].  She underwent endoscopy and colonoscopy which showed colon polyps, no active bleeding.  She was discharged on Protonix.  She was seen by [facility] urology for bladder cancer.  She underwent TURB on 08/09/2022.   Patient was last seen by Dana Horton on 08/23/2022.  At that time, patient denied recurrence of hematuria.  She was maintaining sinus rhythm on flecainide. She remained on Eliquis 5 mg twice daily, flecainide 50 mg twice daily, metoprolol succinate 25 mg daily, valsartan 160 mg daily  Patient was seen by her PCP on 9/30 as an acute visit.  Patient had presented to infusion center to get a Feraheme infusion.  She was noted to be tachycardic with heart rate in the 140s.  She was instructed not to get her infusion but to go to her PCP for evaluation.  At her PCP appointment, she reported generalized weakness, fatigue, shortness  Cardiology Consultation   Patient ID: Dana Horton MRN: [id number]; DOB: April 01, 1945  Admit date: 12/24/2022 Date of Consult: 12/25/2022  PCP:  Crist Fat, MD   [facility] Providers Cardiologist:  Norman Herrlich, MD    Patient Profile:   Dana Horton is a 77 y.o. female with a hx of atrial fibrillation, HFpEF, HTN, tobacco use, COPD, CKD stage IIIa, GERD, bladder cancer, HLD who is being seen 12/25/2022 for the evaluation of atrial fibrillation, CHF at the request of Dr. Isidoro Donning.  History of Present Illness:   Dana Horton is a 77 year old female with above medical history who is followed by Dana Horton, previously followed by Dr. Duke Salvia.  Per chart review, patient was initially seen by cardiology in 10/2016 for evaluation of chest pain.  She saw her PCP on 09/13/2016 and reported chest pain, EKG at that appointment showed atrial fibrillation with RVR.  Patient was admitted to [facility] and she converted to sinus rhythm when her IV was inserted.  Echocardiogram on 09/14/16 showed EF 60-65%, mild mitral and tricuspid regurgitation.  Patient was started on metoprolol and full-strength aspirin.  While on full-strength aspirin, patient developed hematochezia.  Reportedly patient had a history of developing hematochezia on full-strength aspirin or NSAIDs.  She reported fatigue and metoprolol was switched to nightly dosing.  She was referred to GI for evaluation of hematochezia.  Underwent upper endoscopy that was normal.  Also underwent colonoscopy that showed multiple polyps.  It was thought that her bleeding was due to intermittent benign anal bleeding.  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  Also found to have internal hemorrhoids.  She was cleared to start anticoagulation 2 weeks following colonoscopy.  Patient was admitted in 03/2017 with A-fib with RVR.  She was  started on IV amiodarone and converted back to rhythm.  Her troponins were elevated.  She underwent left heart catheterization on 04/09/2017 that showed no angiographically apparent CAD.  She was treated with flecainide for her A-fib.She saw Dr. Duke Salvia in clinic on 10/28/2019 and was doing well on a regiment of flecainide, metoprolol, Eliquis.  She was also on amlodipine, valsartan, furosemide for blood pressure management.    Patient was lost to cardiology follow-up until she reestablished care with Dana Horton on 07/25/2022 for preoperative evaluation.  Patient had been seen at [facility] in 05/2022 with low blood pressure, episodes of hematuria.  Found to be anemic with hemoglobin 9.7.  Patient also had evidence of GI bleed.  Was transferred to [facility].  She underwent endoscopy and colonoscopy which showed colon polyps, no active bleeding.  She was discharged on Protonix.  She was seen by [facility] urology for bladder cancer.  She underwent TURB on 08/09/2022.   Patient was last seen by Dana Horton on 08/23/2022.  At that time, patient denied recurrence of hematuria.  She was maintaining sinus rhythm on flecainide. She remained on Eliquis 5 mg twice daily, flecainide 50 mg twice daily, metoprolol succinate 25 mg daily, valsartan 160 mg daily  Patient was seen by her PCP on 9/30 as an acute visit.  Patient had presented to infusion center to get a Feraheme infusion.  She was noted to be tachycardic with heart rate in the 140s.  She was instructed not to get her infusion but to go to her PCP for evaluation.  At her PCP appointment, she reported generalized weakness, fatigue, shortness  Last 24 hours) at 12/25/2022 1238 Last data filed at 12/25/2022 0800 Gross per 24 hour  Intake 749.18 ml  Output --  Net 749.18 ml      12/25/2022    5:00 AM 12/24/2022    4:47 PM 12/24/2022    1:39 PM  Last 3 Weights  Weight (lbs) 130 lb 1.1 oz 129 lb 129 lb 9.6 oz  Weight (kg) 59 kg 58.514 kg 58.786 kg     Body mass index is 23.04 kg/m.  General:  Well nourished, well developed. Elderly female, sitting upright in  the bed wearing [place]  HEENT: normal Neck: no JVD Vascular: Radial pulses 2+ bilaterally Cardiac:  normal S1, S2; RRR; no murmur  Lungs:  crackles in bilateral lung bases  Abd: soft, nontender Ext: no edema in BLE  Musculoskeletal:  No deformities, BUE and BLE strength normal and equal Skin: warm and dry  Neuro:  CNs 2-12 intact, no focal abnormalities noted Psych:  Normal affect   EKG:  The EKG was personally reviewed and demonstrates:  EKG in the ED showed atrial fibrillation, HR 146 BPM.   Telemetry:  Telemetry was personally reviewed and demonstrates:  NSR, HR in the 60s-70s   Relevant CV Studies:   Laboratory Data:  High Sensitivity Troponin:   Recent Labs  Lab 12/24/22 1701 12/24/22 2015  TROPONINIHS 11 13     Chemistry Recent Labs  Lab 12/24/22 1701 12/25/22 0308  NA 139 137  K 3.5 3.7  CL 105 106  CO2 25 23  GLUCOSE 98 124*  BUN 17 18  CREATININE 0.87 0.88  CALCIUM 8.8* 8.2*  MG 1.9  --   GFRNONAA >60 >60  ANIONGAP 9 8    Recent Labs  Lab 12/24/22 1701  PROT 7.0  ALBUMIN 2.9*  AST 13*  ALT 11  ALKPHOS 70  BILITOT 0.7   Lipids No results for input(s): "CHOL", "TRIG", "HDL", "LABVLDL", "LDLCALC", "CHOLHDL" in the last 168 hours.  Hematology Recent Labs  Lab 12/24/22 1701 12/25/22 0308  WBC 7.9 10.5  RBC 2.88* 2.43*  HGB 8.4* 7.1*  HCT 27.8* 23.7*  MCV 96.5 97.5  MCH 29.2 29.2  MCHC 30.2 30.0  RDW 19.9* 19.9*  PLT 157 137*   Thyroid No results for input(s): "TSH", "FREET4" in the last 168 hours.  BNP Recent Labs  Lab 12/24/22 1701  BNP 1,180.3*    DDimer No results for input(s): "DDIMER" in the last 168 hours.   Radiology/Studies:  ECHOCARDIOGRAM COMPLETE  Result Date: 12/25/2022    ECHOCARDIOGRAM REPORT   Patient Name:   Dana Horton Date of Exam: 12/25/2022 Medical Rec #:  [id number]                     Height:       63.0 in Accession #:    [id number]                    Weight:       130.1 lb Date of Birth:   [birth year]                    BSA:          1.611 m Patient Age:    77 years                      BP:           102/33 mmHg Patient Gender: F  Last 24 hours) at 12/25/2022 1238 Last data filed at 12/25/2022 0800 Gross per 24 hour  Intake 749.18 ml  Output --  Net 749.18 ml      12/25/2022    5:00 AM 12/24/2022    4:47 PM 12/24/2022    1:39 PM  Last 3 Weights  Weight (lbs) 130 lb 1.1 oz 129 lb 129 lb 9.6 oz  Weight (kg) 59 kg 58.514 kg 58.786 kg     Body mass index is 23.04 kg/m.  General:  Well nourished, well developed. Elderly female, sitting upright in  the bed wearing [place]  HEENT: normal Neck: no JVD Vascular: Radial pulses 2+ bilaterally Cardiac:  normal S1, S2; RRR; no murmur  Lungs:  crackles in bilateral lung bases  Abd: soft, nontender Ext: no edema in BLE  Musculoskeletal:  No deformities, BUE and BLE strength normal and equal Skin: warm and dry  Neuro:  CNs 2-12 intact, no focal abnormalities noted Psych:  Normal affect   EKG:  The EKG was personally reviewed and demonstrates:  EKG in the ED showed atrial fibrillation, HR 146 BPM.   Telemetry:  Telemetry was personally reviewed and demonstrates:  NSR, HR in the 60s-70s   Relevant CV Studies:   Laboratory Data:  High Sensitivity Troponin:   Recent Labs  Lab 12/24/22 1701 12/24/22 2015  TROPONINIHS 11 13     Chemistry Recent Labs  Lab 12/24/22 1701 12/25/22 0308  NA 139 137  K 3.5 3.7  CL 105 106  CO2 25 23  GLUCOSE 98 124*  BUN 17 18  CREATININE 0.87 0.88  CALCIUM 8.8* 8.2*  MG 1.9  --   GFRNONAA >60 >60  ANIONGAP 9 8    Recent Labs  Lab 12/24/22 1701  PROT 7.0  ALBUMIN 2.9*  AST 13*  ALT 11  ALKPHOS 70  BILITOT 0.7   Lipids No results for input(s): "CHOL", "TRIG", "HDL", "LABVLDL", "LDLCALC", "CHOLHDL" in the last 168 hours.  Hematology Recent Labs  Lab 12/24/22 1701 12/25/22 0308  WBC 7.9 10.5  RBC 2.88* 2.43*  HGB 8.4* 7.1*  HCT 27.8* 23.7*  MCV 96.5 97.5  MCH 29.2 29.2  MCHC 30.2 30.0  RDW 19.9* 19.9*  PLT 157 137*   Thyroid No results for input(s): "TSH", "FREET4" in the last 168 hours.  BNP Recent Labs  Lab 12/24/22 1701  BNP 1,180.3*    DDimer No results for input(s): "DDIMER" in the last 168 hours.   Radiology/Studies:  ECHOCARDIOGRAM COMPLETE  Result Date: 12/25/2022    ECHOCARDIOGRAM REPORT   Patient Name:   Dana Horton Date of Exam: 12/25/2022 Medical Rec #:  [id number]                     Height:       63.0 in Accession #:    [id number]                    Weight:       130.1 lb Date of Birth:   [birth year]                    BSA:          1.611 m Patient Age:    77 years                      BP:           102/33 mmHg Patient Gender: F  Cardiology Consultation   Patient ID: Dana Horton MRN: [id number]; DOB: April 01, 1945  Admit date: 12/24/2022 Date of Consult: 12/25/2022  PCP:  Crist Fat, MD   [facility] Providers Cardiologist:  Norman Herrlich, MD    Patient Profile:   Dana Horton is a 77 y.o. female with a hx of atrial fibrillation, HFpEF, HTN, tobacco use, COPD, CKD stage IIIa, GERD, bladder cancer, HLD who is being seen 12/25/2022 for the evaluation of atrial fibrillation, CHF at the request of Dr. Isidoro Donning.  History of Present Illness:   Dana Horton is a 77 year old female with above medical history who is followed by Dana Horton, previously followed by Dr. Duke Salvia.  Per chart review, patient was initially seen by cardiology in 10/2016 for evaluation of chest pain.  She saw her PCP on 09/13/2016 and reported chest pain, EKG at that appointment showed atrial fibrillation with RVR.  Patient was admitted to [facility] and she converted to sinus rhythm when her IV was inserted.  Echocardiogram on 09/14/16 showed EF 60-65%, mild mitral and tricuspid regurgitation.  Patient was started on metoprolol and full-strength aspirin.  While on full-strength aspirin, patient developed hematochezia.  Reportedly patient had a history of developing hematochezia on full-strength aspirin or NSAIDs.  She reported fatigue and metoprolol was switched to nightly dosing.  She was referred to GI for evaluation of hematochezia.  Underwent upper endoscopy that was normal.  Also underwent colonoscopy that showed multiple polyps.  It was thought that her bleeding was due to intermittent benign anal bleeding.  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  Also found to have internal hemorrhoids.  She was cleared to start anticoagulation 2 weeks following colonoscopy.  Patient was admitted in 03/2017 with A-fib with RVR.  She was  started on IV amiodarone and converted back to rhythm.  Her troponins were elevated.  She underwent left heart catheterization on 04/09/2017 that showed no angiographically apparent CAD.  She was treated with flecainide for her A-fib.She saw Dr. Duke Salvia in clinic on 10/28/2019 and was doing well on a regiment of flecainide, metoprolol, Eliquis.  She was also on amlodipine, valsartan, furosemide for blood pressure management.    Patient was lost to cardiology follow-up until she reestablished care with Dana Horton on 07/25/2022 for preoperative evaluation.  Patient had been seen at [facility] in 05/2022 with low blood pressure, episodes of hematuria.  Found to be anemic with hemoglobin 9.7.  Patient also had evidence of GI bleed.  Was [facility].  She underwent endoscopy and colonoscopy which showed colon polyps, no active bleeding.  She was discharged on Protonix.  She was seen by [facility] urology for bladder cancer.  She underwent TURB on 08/09/2022.   Patient was last seen by Dana Horton on 08/23/2022.  At that time, patient denied recurrence of hematuria.  She was maintaining sinus rhythm on flecainide. She remained on Eliquis 5 mg twice daily, flecainide 50 mg twice daily, metoprolol succinate 25 mg daily, valsartan 160 mg daily  Patient was seen by her PCP on 9/30 as an acute visit.  Patient had presented to infusion center to get a Feraheme infusion.  She was noted to be tachycardic with heart rate in the 140s.  She was instructed not to get her infusion but to go to her PCP for evaluation.  At her PCP appointment, she reported generalized weakness, fatigue, shortness  Cardiology Consultation   Patient ID: Dana Horton MRN: [id number]; DOB: April 01, 1945  Admit date: 12/24/2022 Date of Consult: 12/25/2022  PCP:  Crist Fat, MD   [facility] Providers Cardiologist:  Norman Herrlich, MD    Patient Profile:   Dana Horton is a 77 y.o. female with a hx of atrial fibrillation, HFpEF, HTN, tobacco use, COPD, CKD stage IIIa, GERD, bladder cancer, HLD who is being seen 12/25/2022 for the evaluation of atrial fibrillation, CHF at the request of Dr. Isidoro Donning.  History of Present Illness:   Dana Horton is a 77 year old female with above medical history who is followed by Dana Horton, previously followed by Dr. Duke Salvia.  Per chart review, patient was initially seen by cardiology in 10/2016 for evaluation of chest pain.  She saw her PCP on 09/13/2016 and reported chest pain, EKG at that appointment showed atrial fibrillation with RVR.  Patient was admitted to [facility] and she converted to sinus rhythm when her IV was inserted.  Echocardiogram on 09/14/16 showed EF 60-65%, mild mitral and tricuspid regurgitation.  Patient was started on metoprolol and full-strength aspirin.  While on full-strength aspirin, patient developed hematochezia.  Reportedly patient had a history of developing hematochezia on full-strength aspirin or NSAIDs.  She reported fatigue and metoprolol was switched to nightly dosing.  She was referred to GI for evaluation of hematochezia.  Underwent upper endoscopy that was normal.  Also underwent colonoscopy that showed multiple polyps.  It was thought that her bleeding was due to intermittent benign anal bleeding.  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  Also found to have internal hemorrhoids.  She was cleared to start anticoagulation 2 weeks following colonoscopy.  Patient was admitted in 03/2017 with A-fib with RVR.  She was  started on IV amiodarone and converted back to rhythm.  Her troponins were elevated.  She underwent left heart catheterization on 04/09/2017 that showed no angiographically apparent CAD.  She was treated with flecainide for her A-fib.She saw Dr. Duke Salvia in clinic on 10/28/2019 and was doing well on a regiment of flecainide, metoprolol, Eliquis.  She was also on amlodipine, valsartan, furosemide for blood pressure management.    Patient was lost to cardiology follow-up until she reestablished care with Dana Horton on 07/25/2022 for preoperative evaluation.  Patient had been seen at [facility] in 05/2022 with low blood pressure, episodes of hematuria.  Found to be anemic with hemoglobin 9.7.  Patient also had evidence of GI bleed.  Was transferred [facility].  She underwent endoscopy and colonoscopy which showed colon polyps, no active bleeding.  She was discharged on Protonix.  She was seen by [facility] urology for bladder cancer.  She underwent TURB on 08/09/2022.   Patient was last seen by Dana Horton on 08/23/2022.  At that time, patient denied recurrence of hematuria.  She was maintaining sinus rhythm on flecainide. She remained on Eliquis 5 mg twice daily, flecainide 50 mg twice daily, metoprolol succinate 25 mg daily, valsartan 160 mg daily  Patient was seen by her PCP on 9/30 as an acute visit.  Patient had presented to infusion center to get a Feraheme infusion.  She was noted to be tachycardic with heart rate in the 140s.  She was instructed not to get her infusion but to go to her PCP for evaluation.  At her PCP appointment, she reported generalized weakness, fatigue, shortness  Last 24 hours) at 12/25/2022 1238 Last data filed at 12/25/2022 0800 Gross per 24 hour  Intake 749.18 ml  Output --  Net 749.18 ml      12/25/2022    5:00 AM 12/24/2022    4:47 PM 12/24/2022    1:39 PM  Last 3 Weights  Weight (lbs) 130 lb 1.1 oz 129 lb 129 lb 9.6 oz  Weight (kg) 59 kg 58.514 kg 58.786 kg     Body mass index is 23.04 kg/m.  General:  Well nourished, well developed. Elderly female, sitting upright in  the bed wearing [place]  HEENT: normal Neck: no JVD Vascular: Radial pulses 2+ bilaterally Cardiac:  normal S1, S2; RRR; no murmur  Lungs:  crackles in bilateral lung bases  Abd: soft, nontender Ext: no edema in BLE  Musculoskeletal:  No deformities, BUE and BLE strength normal and equal Skin: warm and dry  Neuro:  CNs 2-12 intact, no focal abnormalities noted Psych:  Normal affect   EKG:  The EKG was personally reviewed and demonstrates:  EKG in the ED showed atrial fibrillation, HR 146 BPM.   Telemetry:  Telemetry was personally reviewed and demonstrates:  NSR, HR in the 60s-70s   Relevant CV Studies:   Laboratory Data:  High Sensitivity Troponin:   Recent Labs  Lab 12/24/22 1701 12/24/22 2015  TROPONINIHS 11 13     Chemistry Recent Labs  Lab 12/24/22 1701 12/25/22 0308  NA 139 137  K 3.5 3.7  CL 105 106  CO2 25 23  GLUCOSE 98 124*  BUN 17 18  CREATININE 0.87 0.88  CALCIUM 8.8* 8.2*  MG 1.9  --   GFRNONAA >60 >60  ANIONGAP 9 8    Recent Labs  Lab 12/24/22 1701  PROT 7.0  ALBUMIN 2.9*  AST 13*  ALT 11  ALKPHOS 70  BILITOT 0.7   Lipids No results for input(s): "CHOL", "TRIG", "HDL", "LABVLDL", "LDLCALC", "CHOLHDL" in the last 168 hours.  Hematology Recent Labs  Lab 12/24/22 1701 12/25/22 0308  WBC 7.9 10.5  RBC 2.88* 2.43*  HGB 8.4* 7.1*  HCT 27.8* 23.7*  MCV 96.5 97.5  MCH 29.2 29.2  MCHC 30.2 30.0  RDW 19.9* 19.9*  PLT 157 137*   Thyroid No results for input(s): "TSH", "FREET4" in the last 168 hours.  BNP Recent Labs  Lab 12/24/22 1701  BNP 1,180.3*    DDimer No results for input(s): "DDIMER" in the last 168 hours.   Radiology/Studies:  ECHOCARDIOGRAM COMPLETE  Result Date: 12/25/2022    ECHOCARDIOGRAM REPORT   Patient Name:   Dana Horton Date of Exam: 12/25/2022 Medical Rec #:  [id number]                     Height:       63.0 in Accession #:    [id number]                    Weight:       130.1 lb Date of Birth:   [birth year]                    BSA:          1.611 m Patient Age:    77 years                      BP:           102/33 mmHg Patient Gender: F  Last 24 hours) at 12/25/2022 1238 Last data filed at 12/25/2022 0800 Gross per 24 hour  Intake 749.18 ml  Output --  Net 749.18 ml      12/25/2022    5:00 AM 12/24/2022    4:47 PM 12/24/2022    1:39 PM  Last 3 Weights  Weight (lbs) 130 lb 1.1 oz 129 lb 129 lb 9.6 oz  Weight (kg) 59 kg 58.514 kg 58.786 kg     Body mass index is 23.04 kg/m.  General:  Well nourished, well developed. Elderly female, sitting upright in  the bed wearing [place]  HEENT: normal Neck: no JVD Vascular: Radial pulses 2+ bilaterally Cardiac:  normal S1, S2; RRR; no murmur  Lungs:  crackles in bilateral lung bases  Abd: soft, nontender Ext: no edema in BLE  Musculoskeletal:  No deformities, BUE and BLE strength normal and equal Skin: warm and dry  Neuro:  CNs 2-12 intact, no focal abnormalities noted Psych:  Normal affect   EKG:  The EKG was personally reviewed and demonstrates:  EKG in the ED showed atrial fibrillation, HR 146 BPM.   Telemetry:  Telemetry was personally reviewed and demonstrates:  NSR, HR in the 60s-70s   Relevant CV Studies:   Laboratory Data:  High Sensitivity Troponin:   Recent Labs  Lab 12/24/22 1701 12/24/22 2015  TROPONINIHS 11 13     Chemistry Recent Labs  Lab 12/24/22 1701 12/25/22 0308  NA 139 137  K 3.5 3.7  CL 105 106  CO2 25 23  GLUCOSE 98 124*  BUN 17 18  CREATININE 0.87 0.88  CALCIUM 8.8* 8.2*  MG 1.9  --   GFRNONAA >60 >60  ANIONGAP 9 8    Recent Labs  Lab 12/24/22 1701  PROT 7.0  ALBUMIN 2.9*  AST 13*  ALT 11  ALKPHOS 70  BILITOT 0.7   Lipids No results for input(s): "CHOL", "TRIG", "HDL", "LABVLDL", "LDLCALC", "CHOLHDL" in the last 168 hours.  Hematology Recent Labs  Lab 12/24/22 1701 12/25/22 0308  WBC 7.9 10.5  RBC 2.88* 2.43*  HGB 8.4* 7.1*  HCT 27.8* 23.7*  MCV 96.5 97.5  MCH 29.2 29.2  MCHC 30.2 30.0  RDW 19.9* 19.9*  PLT 157 137*   Thyroid No results for input(s): "TSH", "FREET4" in the last 168 hours.  BNP Recent Labs  Lab 12/24/22 1701  BNP 1,180.3*    DDimer No results for input(s): "DDIMER" in the last 168 hours.   Radiology/Studies:  ECHOCARDIOGRAM COMPLETE  Result Date: 12/25/2022    ECHOCARDIOGRAM REPORT   Patient Name:   Dana Horton Date of Exam: 12/25/2022 Medical Rec #:  [id number]                     Height:       63.0 in Accession #:    [id number]                    Weight:       130.1 lb Date of Birth:   [birth year]                    BSA:          1.611 m Patient Age:    77 years                      BP:           102/33 mmHg Patient Gender: F  Cardiology Consultation   Patient ID: Dana Horton MRN: [id number]; DOB: April 01, 1945  Admit date: 12/24/2022 Date of Consult: 12/25/2022  PCP:  Crist Fat, MD   [facility] Providers Cardiologist:  Norman Herrlich, MD    Patient Profile:   Dana Horton is a 77 y.o. female with a hx of atrial fibrillation, HFpEF, HTN, tobacco use, COPD, CKD stage IIIa, GERD, bladder cancer, HLD who is being seen 12/25/2022 for the evaluation of atrial fibrillation, CHF at the request of Dr. Isidoro Donning.  History of Present Illness:   Dana Horton is a 77 year old female with above medical history who is followed by Dana Horton, previously followed by Dr. Duke Salvia.  Per chart review, patient was initially seen by cardiology in 10/2016 for evaluation of chest pain.  She saw her PCP on 09/13/2016 and reported chest pain, EKG at that appointment showed atrial fibrillation with RVR.  Patient was admitted to [facility] and she converted to sinus rhythm when her IV was inserted.  Echocardiogram on 09/14/16 showed EF 60-65%, mild mitral and tricuspid regurgitation.  Patient was started on metoprolol and full-strength aspirin.  While on full-strength aspirin, patient developed hematochezia.  Reportedly patient had a history of developing hematochezia on full-strength aspirin or NSAIDs.  She reported fatigue and metoprolol was switched to nightly dosing.  She was referred to GI for evaluation of hematochezia.  Underwent upper endoscopy that was normal.  Also underwent colonoscopy that showed multiple polyps.  It was thought that her bleeding was due to intermittent benign anal bleeding.  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  Also found to have internal hemorrhoids.  She was cleared to start anticoagulation 2 weeks following colonoscopy.  Patient was admitted in 03/2017 with A-fib with RVR.  She was  started on IV amiodarone and converted back to rhythm.  Her troponins were elevated.  She underwent left heart catheterization on 04/09/2017 that showed no angiographically apparent CAD.  She was treated with flecainide for her A-fib.She saw Dr. Duke Salvia in clinic on 10/28/2019 and was doing well on a regiment of flecainide, metoprolol, Eliquis.  She was also on amlodipine, valsartan, furosemide for blood pressure management.    Patient was lost to cardiology follow-up until she reestablished care with Dana Horton on 07/25/2022 for preoperative evaluation.  Patient had been seen at [facility] in 05/2022 with low blood pressure, episodes of hematuria.  Found to be anemic with hemoglobin 9.7.  Patient also had evidence of GI bleed.  [facility]  She underwent endoscopy and colonoscopy which showed colon polyps, no active bleeding.  She was discharged on Protonix.  She was seen by [facility] urology for bladder cancer.  She underwent TURB on 08/09/2022.   Patient was last seen by Dana Horton on 08/23/2022.  At that time, patient denied recurrence of hematuria.  She was maintaining sinus rhythm on flecainide. She remained on Eliquis 5 mg twice daily, flecainide 50 mg twice daily, metoprolol succinate 25 mg daily, valsartan 160 mg daily  Patient was seen by her PCP on 9/30 as an acute visit.  Patient had presented to infusion center to get a Feraheme infusion.  She was noted to be tachycardic with heart rate in the 140s.  She was instructed not to get her infusion but to go to her PCP for evaluation.  At her PCP appointment, she reported generalized weakness, fatigue, shortness

## 2022-12-25 NOTE — Plan of Care (Signed)
  Problem: Education: Goal: Ability to demonstrate management of disease process will improve Outcome: Progressing Goal: Ability to verbalize understanding of medication therapies will improve Outcome: Progressing   Problem: Activity: Goal: Capacity to carry out activities will improve Outcome: Progressing   Problem: Education: Goal: Knowledge of General Education information will improve Description: Including pain rating scale, medication(s)/side effects and non-pharmacologic comfort measures Outcome: Progressing   Problem: Health Behavior/Discharge Planning: Goal: Ability to manage health-related needs will improve Outcome: Progressing   Problem: Activity: Goal: Risk for activity intolerance will decrease Outcome: Progressing

## 2022-12-26 DIAGNOSIS — J9601 Acute respiratory failure with hypoxia: Secondary | ICD-10-CM | POA: Diagnosis not present

## 2022-12-26 LAB — CBC
HCT: 26.3 % — ABNORMAL LOW (ref 36.0–46.0)
Hemoglobin: 8.1 g/dL — ABNORMAL LOW (ref 12.0–15.0)
MCH: 29.1 pg (ref 26.0–34.0)
MCHC: 30.8 g/dL (ref 30.0–36.0)
MCV: 94.6 fL (ref 80.0–100.0)
Platelets: 140 10*3/uL — ABNORMAL LOW (ref 150–400)
RBC: 2.78 MIL/uL — ABNORMAL LOW (ref 3.87–5.11)
RDW: 18.9 % — ABNORMAL HIGH (ref 11.5–15.5)
WBC: 7 10*3/uL (ref 4.0–10.5)
nRBC: 0 % (ref 0.0–0.2)

## 2022-12-26 LAB — URINALYSIS, ROUTINE W REFLEX MICROSCOPIC
Bilirubin Urine: NEGATIVE
Glucose, UA: NEGATIVE mg/dL
Ketones, ur: NEGATIVE mg/dL
Leukocytes,Ua: NEGATIVE
Nitrite: NEGATIVE
Protein, ur: NEGATIVE mg/dL
Specific Gravity, Urine: 1.017 (ref 1.005–1.030)
pH: 5 (ref 5.0–8.0)

## 2022-12-26 LAB — TYPE AND SCREEN
ABO/RH(D): O POS
Antibody Screen: NEGATIVE
Unit division: 0

## 2022-12-26 LAB — BPAM RBC
Blood Product Expiration Date: 202410262359
ISSUE DATE / TIME: 202410011511
Unit Type and Rh: 5100

## 2022-12-26 LAB — BASIC METABOLIC PANEL
Anion gap: 10 (ref 5–15)
BUN: 19 mg/dL (ref 8–23)
CO2: 22 mmol/L (ref 22–32)
Calcium: 8.3 mg/dL — ABNORMAL LOW (ref 8.9–10.3)
Chloride: 105 mmol/L (ref 98–111)
Creatinine, Ser: 0.73 mg/dL (ref 0.44–1.00)
GFR, Estimated: >60 mL/min (ref >60)
Glucose, Bld: 105 mg/dL — ABNORMAL HIGH (ref 70–99)
Potassium: 3.6 mmol/L (ref 3.5–5.1)
Sodium: 137 mmol/L (ref 135–145)

## 2022-12-26 LAB — OCCULT BLOOD X 1 CARD TO LAB, STOOL: Fecal Occult Bld: NEGATIVE

## 2022-12-26 MED ORDER — METOPROLOL TARTRATE 5 MG/5ML IV SOLN
2.5000 mg | Freq: Once | INTRAVENOUS | Status: AC
  Administered 2022-12-26: 2.5 mg via INTRAVENOUS
  Filled 2022-12-26: qty 5

## 2022-12-26 MED ORDER — AMIODARONE HCL IN DEXTROSE 360-4.14 MG/200ML-% IV SOLN
30.0000 mg/h | INTRAVENOUS | Status: DC
  Administered 2022-12-26 – 2022-12-27 (×2): 30 mg/h via INTRAVENOUS
  Filled 2022-12-26 (×2): qty 200

## 2022-12-26 MED ORDER — AMIODARONE HCL IN DEXTROSE 360-4.14 MG/200ML-% IV SOLN
60.0000 mg/h | INTRAVENOUS | Status: AC
  Administered 2022-12-26: 60 mg/h via INTRAVENOUS
  Filled 2022-12-26: qty 200

## 2022-12-26 NOTE — Plan of Care (Signed)
  Problem: Clinical Measurements: Goal: Respiratory complications will improve Outcome: Progressing   Problem: Activity: Goal: Risk for activity intolerance will decrease Outcome: Progressing   Problem: Pain Managment: Goal: General experience of comfort will improve Outcome: Progressing   Problem: Safety: Goal: Ability to remain free from injury will improve Outcome: Progressing   

## 2022-12-26 NOTE — Plan of Care (Signed)
  Problem: Education: Goal: Ability to demonstrate management of disease process will improve Outcome: Progressing Goal: Ability to verbalize understanding of medication therapies will improve Outcome: Progressing   Problem: Education: Goal: Knowledge of General Education information will improve Description: Including pain rating scale, medication(s)/side effects and non-pharmacologic comfort measures Outcome: Progressing   Problem: Clinical Measurements: Goal: Cardiovascular complication will be avoided Outcome: Progressing   Problem: Nutrition: Goal: Adequate nutrition will be maintained Outcome: Progressing   Problem: Coping: Goal: Level of anxiety will decrease Outcome: Progressing   Problem: Elimination: Goal: Will not experience complications related to bowel motility Outcome: Progressing Goal: Will not experience complications related to urinary retention Outcome: Progressing   Problem: Pain Managment: Goal: General experience of comfort will improve Outcome: Progressing   Problem: Safety: Goal: Ability to remain free from injury will improve Outcome: Progressing   Problem: Clinical Measurements: Goal: Respiratory complications will improve Outcome: Not Progressing

## 2022-12-26 NOTE — Progress Notes (Signed)
   12/26/22 2229  BiPAP/CPAP/SIPAP  BiPAP/CPAP/SIPAP Pt Type Adult  BiPAP/CPAP/SIPAP Resmed (home unit including mask and tubing)  Mask Type Full face mask  Mask Size Medium  Respiratory Rate 26 breaths/min  EPAP 10 cmH2O  Flow Rate 2 lpm  Patient Home Equipment Yes (sterile water added to humidifier)  Auto Titrate No  Safety Check Completed by RT for Home Unit Yes, no issues noted (machine plugged into red port)  BiPAP/CPAP /SiPAP Vitals  Pulse Rate (!) 115  BP (!) 139/56  Bilateral Breath Sounds Diminished

## 2022-12-26 NOTE — Progress Notes (Signed)
Pt awake, alert, watching tv, no respiratory distress noted or voiced by patient at this time.  HR77, [id number], spo2 98% on 2l [place].  Bipap not indicated at this time.  Pt will be placed on her home cpap later when ready for bed.

## 2022-12-26 NOTE — Progress Notes (Signed)
PROGRESS NOTE    Dana Horton  AVW:[id number] DOB: Oct 31, 1945 DOA: 12/24/2022 PCP: Crist Fat, MD     Brief Narrative:  Dana Horton is a 77 year old female with past medical history significant for A-fib, CKD, COPD, diastolic heart failure, OSA on CPAP, hypertension who presented with acute onset of palpitation, generalized weakness.  She was found to have low blood pressure in the 80s.  She was scheduled to have iron infusion but was noted to be in A-fib RVR with heart rate 1 40-1 60s, symptomatic with shortness of breath.  She was then sent to the emergency department.  In the ED, she was in A-fib RVR with heart rate up to 167, BP 96/63, noted to be in respiratory distress.  She was subsequently placed on BiPAP and admitted to stepdown unit.  Pulmonology as well as cardiology consulted.  New events last 24 hours / Subjective: Patient back in A-fib RVR this morning, heart rate 130-150s.  She admits to some palpitations.  Remains on room air.  During my evaluation, cardiology PA came to the room.  They plan to start her back on amiodarone drip.  Assessment & Plan:   Principal Problem:   Acute respiratory failure with hypoxia (HCC) Active Problems:   Essential hypertension   OSA on CPAP   Paroxysmal atrial fibrillation with RVR (HCC)   Acute on chronic anemia   Chronic obstructive pulmonary disease (HCC)   Stage 3a chronic kidney disease (HCC)   GERD without esophagitis   Peripheral neuropathy   Acute on chronic diastolic CHF (congestive heart failure) (HCC)   Acute hypoxemic respiratory failure -Resolved, off BiPAP and currently on room air  Paroxysmal A-fib with RVR -Reportedly had not taken metoprolol, Eliquis, flecainide on 9/29 or 9/30 due to concern about low BP -Flecainide, metoprolol --> Restart amiodarone drip -Eliquis  -Cardiology following  Acute on chronic diastolic CHF -Echo showed EF of 65 to 70%, moderate asymmetric left  ventricular hypertrophy of the basal septal segment, G3 DD, normal right ventricular systolic function, left atrium severely dilated, severe MR, moderate aortic valve regurgitation.  -Holding off further diuresis due to low BP  Acute on chronic normocytic anemia -Baseline hemoglobin 8-9.  Anemia panel in 08/2022 had shown low iron and % sat.  Previous in 2018 EGD normal and colonoscopy had shown diverticulosis and polyps.  -Eliquis, continue to monitor for any signs of bleeding -FOBT negative  -Ferrous sulfate  Hyperlipidemia -Crestor  GERD -Protonix  OSA -CPAP nightly  DVT prophylaxis:  apixaban (ELIQUIS) tablet 5 mg  Code Status: DNR Family Communication: None at bedside Disposition Plan: Home Status is: Inpatient Remains inpatient appropriate because: A-fib RVR today    Antimicrobials:  Anti-infectives (From admission, onward)    None        Objective: Vitals:   12/26/22 0900 12/26/22 0915 12/26/22 0930 12/26/22 1000  BP:   (!) 115/45 (!) 136/59  Pulse: (!) 162 (!) 153 (!) 147 (!) 147  Resp: (!) 24 (!) 25 (!) 22 (!) 21  Temp:      TempSrc:      SpO2: 98% 97% 96% 91%  Weight:      Height:        Intake/Output Summary (Last 24 hours) at 12/26/2022 1205 Last data filed at 12/26/2022 1000 Gross per 24 hour  Intake 381.83 ml  Output 550 ml  Net -168.17 ml   Filed Weights   12/24/22 1647 12/25/22 0500  Weight: 58.5 kg 59 kg  Height:       63.0 in Accession #:    [id number]                    Weight:       130.1 lb Date of Birth:  04/18/1945                    BSA:          1.611 m Patient Age:    76 years                      BP:           102/33 mmHg Patient Gender: F                             HR:           72 bpm. Exam Location:  Inpatient Procedure: 2D Echo, Cardiac Doppler and Color Doppler Indications:    CHF  History:        Patient has no prior history of Echocardiogram examinations.                 CHF, COPD, Arrythmias:Atrial Fibrillation; Risk                 Factors:Hypertension and Dyslipidemia.  Sonographer:    Melton Krebs RDCS, FE, PE Referring Phys: [id number] JAN A MANSY IMPRESSIONS  1. Left ventricular ejection fraction, by estimation, is 65 to 70%. The left ventricle has normal function. The left ventricle has no regional wall motion abnormalities. There is moderate asymmetric left ventricular hypertrophy of the basal-septal segment. Left ventricular diastolic parameters are consistent with Grade III diastolic dysfunction (restrictive).   2. Right ventricular systolic function is normal. The right ventricular size is normal. There is moderately elevated pulmonary artery systolic pressure. The estimated right ventricular systolic pressure is 59.3 mmHg.  3. Left atrial size was severely dilated.  4. Right atrial size was moderately dilated.  5. The mitral valve is thickened with mildly restricted leaflet motion. The left atrium is severely dialted with dialtion of the MV annulus. There is severe central MR. The mitral valve is abnormal. Severe mitral valve regurgitation. No evidence of mitral stenosis.  6. Tricuspid valve regurgitation is moderate.  7. The aortic valve is tricuspid. Aortic valve regurgitation is mild to moderate. No aortic stenosis is present. Aortic valve area, by VTI measures 2.45 cm. Aortic valve mean gradient measures 9.0 mmHg. Aortic valve Vmax measures 2.03 m/s.  8. The inferior vena cava is dilated in size with <50% respiratory variability, suggesting right atrial pressure of 15 mmHg. Conclusion(s)/Recommendation(s): Consider TEE to further evalaute valvular disease if clinically indicated. FINDINGS  Left Ventricle: Left ventricular ejection fraction, by estimation, is 65 to 70%. The left ventricle has normal function. The left ventricle has no regional wall motion abnormalities. The left ventricular internal cavity size was normal in size. There is  moderate asymmetric left ventricular hypertrophy of the basal-septal segment. Left ventricular diastolic parameters are consistent with Grade III diastolic dysfunction (restrictive). Right Ventricle: The right ventricular size is normal. No increase in right ventricular wall thickness. Right ventricular systolic function is normal. There is moderately elevated pulmonary artery systolic pressure. The tricuspid regurgitant velocity is 3.51 m/s, and with an assumed right atrial pressure of 10 mmHg, the estimated right ventricular systolic pressure is 59.3 mmHg. Left Atrium: Left  atrial size was  PROGRESS NOTE    Dana Horton  AVW:[id number] DOB: Oct 31, 1945 DOA: 12/24/2022 PCP: Crist Fat, MD     Brief Narrative:  Dana Horton is a 77 year old female with past medical history significant for A-fib, CKD, COPD, diastolic heart failure, OSA on CPAP, hypertension who presented with acute onset of palpitation, generalized weakness.  She was found to have low blood pressure in the 80s.  She was scheduled to have iron infusion but was noted to be in A-fib RVR with heart rate 1 40-1 60s, symptomatic with shortness of breath.  She was then sent to the emergency department.  In the ED, she was in A-fib RVR with heart rate up to 167, BP 96/63, noted to be in respiratory distress.  She was subsequently placed on BiPAP and admitted to stepdown unit.  Pulmonology as well as cardiology consulted.  New events last 24 hours / Subjective: Patient back in A-fib RVR this morning, heart rate 130-150s.  She admits to some palpitations.  Remains on room air.  During my evaluation, cardiology PA came to the room.  They plan to start her back on amiodarone drip.  Assessment & Plan:   Principal Problem:   Acute respiratory failure with hypoxia (HCC) Active Problems:   Essential hypertension   OSA on CPAP   Paroxysmal atrial fibrillation with RVR (HCC)   Acute on chronic anemia   Chronic obstructive pulmonary disease (HCC)   Stage 3a chronic kidney disease (HCC)   GERD without esophagitis   Peripheral neuropathy   Acute on chronic diastolic CHF (congestive heart failure) (HCC)   Acute hypoxemic respiratory failure -Resolved, off BiPAP and currently on room air  Paroxysmal A-fib with RVR -Reportedly had not taken metoprolol, Eliquis, flecainide on 9/29 or 9/30 due to concern about low BP -Flecainide, metoprolol --> Restart amiodarone drip -Eliquis  -Cardiology following  Acute on chronic diastolic CHF -Echo showed EF of 65 to 70%, moderate asymmetric left  ventricular hypertrophy of the basal septal segment, G3 DD, normal right ventricular systolic function, left atrium severely dilated, severe MR, moderate aortic valve regurgitation.  -Holding off further diuresis due to low BP  Acute on chronic normocytic anemia -Baseline hemoglobin 8-9.  Anemia panel in 08/2022 had shown low iron and % sat.  Previous in 2018 EGD normal and colonoscopy had shown diverticulosis and polyps.  -Eliquis, continue to monitor for any signs of bleeding -FOBT negative  -Ferrous sulfate  Hyperlipidemia -Crestor  GERD -Protonix  OSA -CPAP nightly  DVT prophylaxis:  apixaban (ELIQUIS) tablet 5 mg  Code Status: DNR Family Communication: None at bedside Disposition Plan: Home Status is: Inpatient Remains inpatient appropriate because: A-fib RVR today    Antimicrobials:  Anti-infectives (From admission, onward)    None        Objective: Vitals:   12/26/22 0900 12/26/22 0915 12/26/22 0930 12/26/22 1000  BP:   (!) 115/45 (!) 136/59  Pulse: (!) 162 (!) 153 (!) 147 (!) 147  Resp: (!) 24 (!) 25 (!) 22 (!) 21  Temp:      TempSrc:      SpO2: 98% 97% 96% 91%  Weight:      Height:        Intake/Output Summary (Last 24 hours) at 12/26/2022 1205 Last data filed at 12/26/2022 1000 Gross per 24 hour  Intake 381.83 ml  Output 550 ml  Net -168.17 ml   Filed Weights   12/24/22 1647 12/25/22 0500  Weight: 58.5 kg 59 kg  Height:       63.0 in Accession #:    [id number]                    Weight:       130.1 lb Date of Birth:  04/18/1945                    BSA:          1.611 m Patient Age:    76 years                      BP:           102/33 mmHg Patient Gender: F                             HR:           72 bpm. Exam Location:  Inpatient Procedure: 2D Echo, Cardiac Doppler and Color Doppler Indications:    CHF  History:        Patient has no prior history of Echocardiogram examinations.                 CHF, COPD, Arrythmias:Atrial Fibrillation; Risk                 Factors:Hypertension and Dyslipidemia.  Sonographer:    Melton Krebs RDCS, FE, PE Referring Phys: [id number] JAN A MANSY IMPRESSIONS  1. Left ventricular ejection fraction, by estimation, is 65 to 70%. The left ventricle has normal function. The left ventricle has no regional wall motion abnormalities. There is moderate asymmetric left ventricular hypertrophy of the basal-septal segment. Left ventricular diastolic parameters are consistent with Grade III diastolic dysfunction (restrictive).   2. Right ventricular systolic function is normal. The right ventricular size is normal. There is moderately elevated pulmonary artery systolic pressure. The estimated right ventricular systolic pressure is 59.3 mmHg.  3. Left atrial size was severely dilated.  4. Right atrial size was moderately dilated.  5. The mitral valve is thickened with mildly restricted leaflet motion. The left atrium is severely dialted with dialtion of the MV annulus. There is severe central MR. The mitral valve is abnormal. Severe mitral valve regurgitation. No evidence of mitral stenosis.  6. Tricuspid valve regurgitation is moderate.  7. The aortic valve is tricuspid. Aortic valve regurgitation is mild to moderate. No aortic stenosis is present. Aortic valve area, by VTI measures 2.45 cm. Aortic valve mean gradient measures 9.0 mmHg. Aortic valve Vmax measures 2.03 m/s.  8. The inferior vena cava is dilated in size with <50% respiratory variability, suggesting right atrial pressure of 15 mmHg. Conclusion(s)/Recommendation(s): Consider TEE to further evalaute valvular disease if clinically indicated. FINDINGS  Left Ventricle: Left ventricular ejection fraction, by estimation, is 65 to 70%. The left ventricle has normal function. The left ventricle has no regional wall motion abnormalities. The left ventricular internal cavity size was normal in size. There is  moderate asymmetric left ventricular hypertrophy of the basal-septal segment. Left ventricular diastolic parameters are consistent with Grade III diastolic dysfunction (restrictive). Right Ventricle: The right ventricular size is normal. No increase in right ventricular wall thickness. Right ventricular systolic function is normal. There is moderately elevated pulmonary artery systolic pressure. The tricuspid regurgitant velocity is 3.51 m/s, and with an assumed right atrial pressure of 10 mmHg, the estimated right ventricular systolic pressure is 59.3 mmHg. Left Atrium: Left  atrial size was  PROGRESS NOTE    Dana Horton  AVW:[id number] DOB: Oct 31, 1945 DOA: 12/24/2022 PCP: Crist Fat, MD     Brief Narrative:  Dana Horton is a 77 year old female with past medical history significant for A-fib, CKD, COPD, diastolic heart failure, OSA on CPAP, hypertension who presented with acute onset of palpitation, generalized weakness.  She was found to have low blood pressure in the 80s.  She was scheduled to have iron infusion but was noted to be in A-fib RVR with heart rate 1 40-1 60s, symptomatic with shortness of breath.  She was then sent to the emergency department.  In the ED, she was in A-fib RVR with heart rate up to 167, BP 96/63, noted to be in respiratory distress.  She was subsequently placed on BiPAP and admitted to stepdown unit.  Pulmonology as well as cardiology consulted.  New events last 24 hours / Subjective: Patient back in A-fib RVR this morning, heart rate 130-150s.  She admits to some palpitations.  Remains on room air.  During my evaluation, cardiology PA came to the room.  They plan to start her back on amiodarone drip.  Assessment & Plan:   Principal Problem:   Acute respiratory failure with hypoxia (HCC) Active Problems:   Essential hypertension   OSA on CPAP   Paroxysmal atrial fibrillation with RVR (HCC)   Acute on chronic anemia   Chronic obstructive pulmonary disease (HCC)   Stage 3a chronic kidney disease (HCC)   GERD without esophagitis   Peripheral neuropathy   Acute on chronic diastolic CHF (congestive heart failure) (HCC)   Acute hypoxemic respiratory failure -Resolved, off BiPAP and currently on room air  Paroxysmal A-fib with RVR -Reportedly had not taken metoprolol, Eliquis, flecainide on 9/29 or 9/30 due to concern about low BP -Flecainide, metoprolol --> Restart amiodarone drip -Eliquis  -Cardiology following  Acute on chronic diastolic CHF -Echo showed EF of 65 to 70%, moderate asymmetric left  ventricular hypertrophy of the basal septal segment, G3 DD, normal right ventricular systolic function, left atrium severely dilated, severe MR, moderate aortic valve regurgitation.  -Holding off further diuresis due to low BP  Acute on chronic normocytic anemia -Baseline hemoglobin 8-9.  Anemia panel in 08/2022 had shown low iron and % sat.  Previous in 2018 EGD normal and colonoscopy had shown diverticulosis and polyps.  -Eliquis, continue to monitor for any signs of bleeding -FOBT negative  -Ferrous sulfate  Hyperlipidemia -Crestor  GERD -Protonix  OSA -CPAP nightly  DVT prophylaxis:  apixaban (ELIQUIS) tablet 5 mg  Code Status: DNR Family Communication: None at bedside Disposition Plan: Home Status is: Inpatient Remains inpatient appropriate because: A-fib RVR today    Antimicrobials:  Anti-infectives (From admission, onward)    None        Objective: Vitals:   12/26/22 0900 12/26/22 0915 12/26/22 0930 12/26/22 1000  BP:   (!) 115/45 (!) 136/59  Pulse: (!) 162 (!) 153 (!) 147 (!) 147  Resp: (!) 24 (!) 25 (!) 22 (!) 21  Temp:      TempSrc:      SpO2: 98% 97% 96% 91%  Weight:      Height:        Intake/Output Summary (Last 24 hours) at 12/26/2022 1205 Last data filed at 12/26/2022 1000 Gross per 24 hour  Intake 381.83 ml  Output 550 ml  Net -168.17 ml   Filed Weights   12/24/22 1647 12/25/22 0500  Weight: 58.5 kg 59 kg

## 2022-12-26 NOTE — Progress Notes (Addendum)
Patient Name: Dana Horton Date of Encounter: 12/26/2022 [facility] Cardiologist: Norman Herrlich, MD   Interval Summary  .    Patient back in afib this morning with HR up to the 150s-160s. Converted after receiving breathing treatment. She feels some palpitations. No chest pain or shortness of breath. No ankle edema.   Vital Signs .    Vitals:   12/26/22 0003 12/26/22 0321 12/26/22 0818 12/26/22 0820  BP:      Pulse:    79  Resp:    (!) 24  Temp: 98.4 F (36.9 C) 98.5 F (36.9 C)    TempSrc: Oral Oral    SpO2:   97% 95%  Weight:      Height:        Intake/Output Summary (Last 24 hours) at 12/26/2022 0823 Last data filed at 12/26/2022 0320 Gross per 24 hour  Intake 366.11 ml  Output 550 ml  Net -183.89 ml      12/25/2022    5:00 AM 12/24/2022    4:47 PM 12/24/2022    1:39 PM  Last 3 Weights  Weight (lbs) 130 lb 1.1 oz 129 lb 129 lb 9.6 oz  Weight (kg) 59 kg 58.514 kg 58.786 kg      Telemetry/ECG    Patient converted to afib with RVR this AM around 0820. HR in the 150s-160s  - Personally Reviewed  Physical Exam .   GEN: No acute distress.  Laying in bed with head elevated  Neck: No JVD Cardiac: Irregular rate and rhythm, tachycardic. Systolic murmur present at apex  Respiratory: Clear to auscultation bilaterally. Normal work of breathing on RA  GI: Soft, nontender, non-distended  MS: No edema in BLE   Assessment & Plan .     Paroxysmal Atrial Fibrillation  - First diagnosed with A-fib in 2018.  Has been maintained on a regimen of flecainide 50 mg twice daily, metoprolol succinate 25 mg daily, Eliquis 5 mg twice daily.  Recent 2 week zio from 08/2022 showed less than 1% A-fib burden - Reportedly did not take her metoprolol, Eliquis, flecainide on 9/29 or 9/30 because she was concerned about low BP  -On 9/30, patient developed palpitations and was found to be in A-fib with RVR, heart rate up to the 150s.  In the ED she was given metoprolol  and IV amiodarone.  Converted back to normal sinus rhythm. Amiodarone stopped  - Patient maintained NSR until this AM around 0820- converted to afib with HR in the 150s-160s after receiving breathing treatment  - BP 120/32 this AM . BP has been low this admission, down to 92/39  - Continue metoprolol 12.5 mg daily - hesitant to titrate given low BP this admission/low diastolic BP  - Start IV amiodarone. Will discuss further with MD as patient has been on flecanide  - Hemoglobin low but stable, this has been a chronic issue. No overt bleeding. Continue eliquis 5 mg BID  - CHADS-VASc 5    Acute Respiratory Failure with hypoxemia  Acute on Chronic Diastolic Heart Failure  Severe MR  Mild- Moderate AR  - Most recent echocardiogram from 08/2016 showed EF 60-65% -In the ED, patient developed significant dyspnea.  BNP elevated to 1180.3.  Chest x-ray with diffuse interstitial and airspace opacities concerning for edema - Echocardiogram this admission showed EF 65-70%, no regional wall motion abnormalities, grade III DD, normal RV function, moderately elevated PA pressure, severe mitral regurgitation, mild-moderate aortic regurgitation  - Patient was given IV  lasix in the ED  - Suspect patient is preload dependent, so need to be cautious with diuretics. Patient euvolemic on exam today, no lasix  - Continue metoprolol succinate 12.5 mg daily. BP improving, but remains a bit low this AM. Hope to titrate metoprolol as BP tolerates  - Patient expressed that she does not want to have aggressive therapies. May need TEE as an outpatient    HTN  - BP low this AM. Patient also reports low BP at home  - Stopped avapro  - Continue metoprolol 12.5 mg daily    Otherwise per primary  - Acute on chronic anemia  - Peripheral neuropathy  - GERD  For questions or updates, please contact [facility] Please consult [email or web address] for contact info under     Signed, Jonita Albee, PA-C   History  and all data above reviewed.  Patient examined.  I agree with the findings as above.    The patient was in atrial fib this morning.  Started on amiodarone and now in NSR.  She things her breathing is slowly improving.  The patient exam reveals COR:RRR, 3/6 apical systolic murmur  ,  Lungs: Decreased breath sounds  ,  Abd: Positive bowel sounds, no rebound no guarding, Ext No edema  .  All available labs, radiology testing, previous records reviewed. Agree with documented assessment and plan.   Atrial Fib:  Continue IV amio today and hold flecainide.  I will likely use amio PO long term at discharge.  Continue Eliquis.  Sept hypertrophy and MR:  Plan for now is medical management. No change in therapy.     Fayrene Fearing Aleaha Fickling  12:06 PM  12/26/2022

## 2022-12-27 DIAGNOSIS — J9601 Acute respiratory failure with hypoxia: Secondary | ICD-10-CM | POA: Diagnosis not present

## 2022-12-27 LAB — BASIC METABOLIC PANEL
Anion gap: 9 (ref 5–15)
BUN: 17 mg/dL (ref 8–23)
CO2: 26 mmol/L (ref 22–32)
Calcium: 8.6 mg/dL — ABNORMAL LOW (ref 8.9–10.3)
Chloride: 104 mmol/L (ref 98–111)
Creatinine, Ser: 0.78 mg/dL (ref 0.44–1.00)
GFR, Estimated: >60 mL/min (ref >60)
Glucose, Bld: 110 mg/dL — ABNORMAL HIGH (ref 70–99)
Potassium: 3.7 mmol/L (ref 3.5–5.1)
Sodium: 139 mmol/L (ref 135–145)

## 2022-12-27 LAB — CBC
HCT: 28.8 % — ABNORMAL LOW (ref 36.0–46.0)
Hemoglobin: 8.7 g/dL — ABNORMAL LOW (ref 12.0–15.0)
MCH: 29.6 pg (ref 26.0–34.0)
MCHC: 30.2 g/dL (ref 30.0–36.0)
MCV: 98 fL (ref 80.0–100.0)
Platelets: 146 10*3/uL — ABNORMAL LOW (ref 150–400)
RBC: 2.94 MIL/uL — ABNORMAL LOW (ref 3.87–5.11)
RDW: 19.3 % — ABNORMAL HIGH (ref 11.5–15.5)
WBC: 6.7 10*3/uL (ref 4.0–10.5)
nRBC: 0 % (ref 0.0–0.2)

## 2022-12-27 MED ORDER — AMIODARONE HCL IN DEXTROSE 360-4.14 MG/200ML-% IV SOLN
30.0000 mg/h | INTRAVENOUS | Status: DC
  Administered 2022-12-27 – 2022-12-28 (×4): 30 mg/h via INTRAVENOUS
  Filled 2022-12-27 (×3): qty 200

## 2022-12-27 MED ORDER — FLECAINIDE ACETATE 50 MG PO TABS
50.0000 mg | ORAL_TABLET | Freq: Two times a day (BID) | ORAL | Status: DC
  Filled 2022-12-27: qty 1

## 2022-12-27 MED ORDER — AMIODARONE HCL 150 MG/3ML IV SOLN
150.0000 mg | Freq: Once | INTRAVENOUS | Status: DC
Start: 2022-12-27 — End: 2022-12-27

## 2022-12-27 MED ORDER — AMIODARONE HCL IN DEXTROSE 360-4.14 MG/200ML-% IV SOLN
60.0000 mg/h | INTRAVENOUS | Status: AC
  Administered 2022-12-27: 60 mg/h via INTRAVENOUS
  Filled 2022-12-27: qty 200

## 2022-12-27 MED ORDER — METOPROLOL SUCCINATE ER 25 MG PO TB24
25.0000 mg | ORAL_TABLET | Freq: Every day | ORAL | Status: DC
  Administered 2022-12-28: 25 mg via ORAL
  Filled 2022-12-27: qty 1

## 2022-12-27 NOTE — TOC Initial Note (Signed)
Transition of Care [facility]) - Initial/Assessment Note    Patient Details  Name: Nicoli Roam [place] MRN: [id number] Date of Birth: 05/01/1945  Transition of Care [facility]) CM/SW Contact:    Darleene Cleaver, LCSW Phone Number: 12/27/2022, 6:38 PM  Clinical Narrative:                  Patient is a 77 year old female who is alert and oriented x4.  Patient lives at home with her husband, and patient's daughter is right down the street from patient.  Patient normally is able to complete her own ADLs, able to ambulate in the community, get to her appointments, and picks up her medications.  If patient unable to drive herself, patient's daughters will take her to the appointments.  CSW spoke to patient's daughter Zeb Comfort, [phone], and discussed possible SNF placement for short term rehab, per patient's daughter, patient will not agree to SNF.  Patient's daughter stated patient would prefer either [facility] PT or going to an outpatient clinic in [place].  CSW informed daughter that PT has not seen patient yet, and they may give recommendations for SNF, per daughter she thinks patient will still say no to SNF.  CSW informed her that [facility] will follow patient throughout discharge planning and coordinate whatever services patient decides on.  Per patient's daughter patient does not have oxygen at home, but patient's spouse does, and patient may need oxygen.  CSW informed daughter that [facility] will arrange for whatever equipment or services patient will need prior to discharge.  Patient's daughter was told that there are several HH agencies that will take her insurance and choice can be provided closer to discharge.  Patient plans to get a ride from her family member to return back home.  SDOH was reviewed, patient does not have any.  Readmission prvevention screen was also completed due to patient being orange for readmission.  Patient does have a PCP, and does not have any problems getting her  medications.  TOC to continue to follow patient's progress throughout discharge planning.  Expected Discharge Plan: Home w Home Health Services Barriers to Discharge: Continued Medical Work up   Patient Goals and CMS Choice Patient states their goals for this hospitalization and ongoing recovery are:: To return back home with home health. CMS Medicare.gov Compare Post Acute Care list provided to:: Patient Represenative (must comment) Choice offered to / list presented to : Adult Children  ownership interest in [facility].provided to:: Adult Children    Expected Discharge Plan and Services In-house Referral: Clinical Social Work   Post Acute Care Choice: Home Health Living arrangements for the past 2 months: Single Family Home                                      Prior Living Arrangements/Services Living arrangements for the past 2 months: Single Family Home Lives with:: Spouse Patient language and need for interpreter reviewed:: Yes Do you feel safe going back to the place where you live?: Yes      Need for Family Participation in Patient Care: Yes (Comment) Care giver support system in place?: No (comment)   Criminal Activity/Legal Involvement Pertinent to Current Situation/Hospitalization: No - Comment as needed  Activities of Daily Living   ADL Screening (condition at time of admission) Independently performs ADLs?: Yes (appropriate for developmental age) Does the patient have a NEW difficulty with  bathing/dressing/toileting/self-feeding that is expected to last >3 days?: No Does the patient have a NEW difficulty with getting in/out of bed, walking, or climbing stairs that is expected to last >3 days?: No Does the patient have a NEW difficulty with communication that is expected to last >3 days?: No Is the patient deaf or have difficulty hearing?: No Does the patient have difficulty seeing, even when wearing glasses/contacts?: No Does the  patient have difficulty concentrating, remembering, or making decisions?: No  Permission Sought/Granted Permission sought to share information with : Case Manager, Family Supports Permission granted to share information with : Yes, Verbal Permission Granted, Yes, Release of Information Signed  Share Information with NAME: Zeb Comfort Daughter [phone]  [phone]  Permission granted to share info w AGENCY: Home Health Agencies        Emotional Assessment Appearance:: Appears stated age   Affect (typically observed): Accepting, Appropriate, Calm Orientation: : Oriented to Place, Oriented to  Time, Oriented to Situation, Oriented to Self Alcohol / Substance Use: Not Applicable Psych Involvement: No (comment)  Admission diagnosis:  Acute respiratory failure (HCC) [J96.00] Acute pulmonary edema (HCC) [J81.0] COPD exacerbation (HCC) [J44.1] Atrial fibrillation with RVR (HCC) [I48.91] Patient Active Problem List   Diagnosis Date Noted   Acute on chronic diastolic CHF (congestive heart failure) (HCC) 12/25/2022   Acute respiratory failure with hypoxia (HCC) 12/24/2022   Paroxysmal atrial fibrillation with RVR (HCC) 12/24/2022   Hypertensive urgency 12/24/2022   GERD without esophagitis 12/24/2022   Peripheral neuropathy 12/24/2022   Acute on chronic anemia 12/24/2022   Malignant neoplasm of urinary bladder (HCC) 12/03/2022   Allergy 08/21/2022   CHF (congestive heart failure) (HCC) 08/21/2022   CKD (chronic kidney disease) stage 3, GFR 30-59 ml/min (HCC) 08/21/2022   Lung nodule 08/21/2022   Myocardial infarction (HCC) 08/21/2022   Osteoporosis 08/21/2022   Tobacco abuse 08/21/2022   Neck pain 08/16/2022   Muscle strain 08/16/2022   Symptomatic anemia 08/07/2022   Atrial fibrillation (HCC) 08/06/2022   Bladder tumor 08/06/2022   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 06/29/2022   Iron deficiency anemia due to chronic blood loss 06/29/2022    Sleep apnea 05/21/2022   Chronic obstructive pulmonary disease (HCC) 05/21/2022   Seasonal allergies 05/21/2022   Hypercholesterolemia 05/21/2022   Cigarette smoker 05/21/2022   Osteopenia 05/21/2022   Stage 3a chronic kidney disease (HCC) 05/21/2022   Primary osteoarthritis involving multiple joints 05/21/2022   History of bladder cancer 05/21/2022   BMI 28.0-28.9,adult 05/21/2022   Bronchitis 03/07/2022   Chronic anticoagulation 06/10/2019   Renal insufficiency 06/10/2019   Atrial fibrillation with rapid ventricular response (HCC) 04/10/2017   HCAP (healthcare-associated pneumonia) 04/10/2017   Elevated troponin    Essential hypertension 04/08/2017   COPD exacerbation (HCC) 04/08/2017   OSA on CPAP 04/08/2017   Chronic diastolic heart failure (HCC)    Non-ST elevation (NSTEMI) myocardial infarction (HCC) 04/07/2017   Demand ischemia (HCC) 09/25/2016   PAF (paroxysmal atrial fibrillation) (HCC) 09/24/2016   Precordial pain 10/17/2015   PCP:  Crist Fat, MD Pharmacy:   [facility], [place] - [address] [place] [address] Phone: [phone] Fax: [phone]  [facility] - [place], [place] - [address] [facility] [address] [place] [address] Phone: [phone] Fax: [phone]  [id number] - [place], [place] - [address] AT [facility] [address] [place] [address] Phone: [phone] Fax: [phone]  [id number] [place], [place] - [phone] AT [facility] [address] [facility] [address] & [address] [place] [address] Phone: [phone] Fax: [phone]     Social Determinants of Health (SDOH) Social History: SDOH Screenings   Food Insecurity: No Food Insecurity (12/25/2022)  Housing: Low Risk  (12/25/2022)  Transportation Needs: No Transportation Needs (12/25/2022)  Utilities: Not At Risk (12/25/2022)  Tobacco Use: High Risk (12/24/2022)    SDOH Interventions:     Readmission Risk Interventions    12/27/2022    6:36 PM 08/09/2022    9:38 AM  Readmission Risk Prevention Plan  Post Dischage Appt  Complete  Medication Screening  Complete  Transportation Screening Complete Complete  PCP or Specialist Appt within 5-7 Days Complete   Home Care Screening Complete   Medication Review (RN CM) Referral to Pharmacy

## 2022-12-27 NOTE — Progress Notes (Signed)
Progress Note  Patient Name: Dana Horton Date of Encounter: 12/27/2022  Primary Cardiologist:   Norman Herrlich, MD   Subjective   She thinks her breathing might be getting better.  She said she coughed up quite a bit of phlegm this morning.  She still not at baseline.  Inpatient Medications    Scheduled Meds:  apixaban  5 mg Oral BID   budesonide (PULMICORT) nebulizer solution  0.5 mg Nebulization BID   Chlorhexidine Gluconate Cloth  6 each Topical Daily   docusate sodium  100 mg Oral Daily   ferrous sulfate  325 mg Oral Q breakfast   fluticasone  2 spray Each Nare Daily   levalbuterol  0.63 mg Nebulization BID   metoprolol succinate  12.5 mg Oral Daily   montelukast  10 mg Oral QPM   pantoprazole  40 mg Oral Daily   pregabalin  50 mg Oral BID   rosuvastatin  5 mg Oral QODAY   Continuous Infusions:  amiodarone 30 mg/hr (12/27/22 0800)   PRN Meds: acetaminophen **OR** acetaminophen, magnesium hydroxide, ondansetron **OR** ondansetron (ZOFRAN) IV, mouth rinse, traZODone   Vital Signs    Vitals:   12/27/22 0819 12/27/22 0900 12/27/22 1000 12/27/22 1112  BP:      Pulse: (!) 114 (!) 121 (!) 122   Resp: (!) 25 (!) 22 (!) 22   Temp:    97.9 F (36.6 C)  TempSrc:    Oral  SpO2: 97% 95% 96%   Weight:      Height:        Intake/Output Summary (Last 24 hours) at 12/27/2022 1202 Last data filed at 12/27/2022 5284 Gross per 24 hour  Intake 1013.91 ml  Output --  Net 1013.91 ml   Filed Weights   12/24/22 1647 12/25/22 0500  Weight: 58.5 kg 59 kg    Telemetry    Atrial fibrillation, rate about 100-110- Personally Reviewed  ECG    N/A- Personally Reviewed  Physical Exam   GEN: No acute distress.   Neck: No  JVD Cardiac: Irregular RR, 3 out of 6 holosystolic murmur heard at the apex, no diastolic murmurs, rubs, or gallops.  Respiratory:   Decreased breath sounds bilaterally with diffuse coarse crackles GI: Soft, nontender, non-distended   MS: No  edema; No deformity. Neuro:  Nonfocal  Psych: Normal affect   Labs    Chemistry Recent Labs  Lab 12/24/22 1701 12/25/22 0308 12/26/22 0309 12/27/22 0258  NA 139 137 137 139  K 3.5 3.7 3.6 3.7  CL 105 106 105 104  CO2 25 23 22 26   GLUCOSE 98 124* 105* 110*  BUN 17 18 19 17   CREATININE 0.87 0.88 0.73 0.78  CALCIUM 8.8* 8.2* 8.3* 8.6*  PROT 7.0  --   --   --   ALBUMIN 2.9*  --   --   --   AST 13*  --   --   --   ALT 11  --   --   --   ALKPHOS 70  --   --   --   BILITOT 0.7  --   --   --   GFRNONAA >60 >60 >60 >60  ANIONGAP 9 8 10 9      Hematology Recent Labs  Lab 12/25/22 0308 12/25/22 1201 12/25/22 2025 12/26/22 0309 12/27/22 0258  WBC 10.5  --   --  7.0 6.7  RBC 2.43* 2.51*  --  2.78* 2.94*  HGB 7.1*  --  8.2* 8.1* 8.7*  HCT 23.7*  --  26.8* 26.3* 28.8*  MCV 97.5  --   --  94.6 98.0  MCH 29.2  --   --  29.1 29.6  MCHC 30.0  --   --  30.8 30.2  RDW 19.9*  --   --  18.9* 19.3*  PLT 137*  --   --  140* 146*    Cardiac EnzymesNo results for input(s): "TROPONINI" in the last 168 hours. No results for input(s): "TROPIPOC" in the last 168 hours.   BNP Recent Labs  Lab 12/24/22 1701  BNP 1,180.3*     DDimer No results for input(s): "DDIMER" in the last 168 hours.   Radiology    No results found.  Cardiac Studies   Echo:  1. Left ventricular ejection fraction, by estimation, is 65 to 70%. The  left ventricle has normal function. The left ventricle has no regional  wall motion abnormalities. There is moderate asymmetric left ventricular  hypertrophy of the basal-septal  segment. Left ventricular diastolic parameters are consistent with Grade  III diastolic dysfunction (restrictive).   2. Right ventricular systolic function is normal. The right ventricular  size is normal. There is moderately elevated pulmonary artery systolic  pressure. The estimated right ventricular systolic pressure is 59.3 mmHg.   3. Left atrial size was severely  dilated.   4. Right atrial size was moderately dilated.   5. The mitral valve is thickened with mildly restricted leaflet motion.  The left atrium is severely dialted with dialtion of the MV annulus. There  is severe central MR. The mitral valve is abnormal. Severe mitral valve  regurgitation. No evidence of  mitral stenosis.   6. Tricuspid valve regurgitation is moderate.   7. The aortic valve is tricuspid. Aortic valve regurgitation is mild to  moderate. No aortic stenosis is present. Aortic valve area, by VTI  measures 2.45 cm. Aortic valve mean gradient measures 9.0 mmHg. Aortic  valve Vmax measures 2.03 m/s.   8. The inferior vena cava is dilated in size with <50% respiratory  variability, suggesting right atrial pressure of 15 mmHg.    Patient Profile     77 y.o. female  with a hx of atrial fibrillation, HFpEF, HTN, tobacco use, COPD, CKD stage IIIa, GERD, bladder cancer, HLD who is being seen 12/25/2022 for the evaluation of atrial fibrillation, CHF at the request of Dr. Isidoro Donning.   Assessment & Plan    Paroxysmal Atrial Fibrillation :  Still in fib.  I will give another run of 60 mg /hour amio and then continue IV drip.  Continue Eliquis.  No plan for DCCV given the paroxysmal nature of this.  Continue beta blocker for rate control.  Her blood pressure will allow so I am going to increase her beta-blocker.  Acute Respiratory Failure with hypoxemia :    Acute on Chronic Diastolic Heart Failure :  Echo results as above.  Medical management.  I suspect she is euvolemic.  Holding off on further diuresis.  Severe MR: Medical management.  HTN : BP has actually been low.  Avapro stopped this admission.   As above it seems like her blood pressure will allow beta-blocker titration.  For questions or updates, please contact [facility] Please consult [email or web address] for contact info under Cardiology/STEMI.   Signed, Rollene Rotunda, MD  12/27/2022, 12:02 PM

## 2022-12-27 NOTE — Plan of Care (Signed)
  Problem: Education: Goal: Ability to demonstrate management of disease process will improve Outcome: Progressing Goal: Ability to verbalize understanding of medication therapies will improve Outcome: Progressing   Problem: Activity: Goal: Capacity to carry out activities will improve Outcome: Progressing   Problem: Cardiac: Goal: Ability to achieve and maintain adequate cardiopulmonary perfusion will improve Outcome: Progressing   Problem: Education: Goal: Knowledge of General Education information will improve Description: Including pain rating scale, medication(s)/side effects and non-pharmacologic comfort measures Outcome: Progressing   Problem: Pain Managment: Goal: General experience of comfort will improve Outcome: Progressing   Problem: Skin Integrity: Goal: Risk for impaired skin integrity will decrease Outcome: Progressing   Problem: Clinical Measurements: Goal: Cardiovascular complication will be avoided Outcome: Not Progressing   Problem: Activity: Goal: Risk for activity intolerance will decrease Outcome: Not Progressing

## 2022-12-27 NOTE — Progress Notes (Signed)
PROGRESS NOTE    Dana Horton  ZOX:[id number] DOB: 09/06/1945 DOA: 12/24/2022 PCP: Crist Fat, MD     Brief Narrative:  Dana Horton is a 77 year old female with past medical history significant for A-fib, CKD, COPD, diastolic heart failure, OSA on CPAP, hypertension who presented with acute onset of palpitation, generalized weakness.  She was found to have low blood pressure in the 80s.  She was scheduled to have iron infusion but was noted to be in A-fib RVR with heart rate 1 40-1 60s, symptomatic with shortness of breath.  She was then sent to the emergency department.  In the ED, she was in A-fib RVR with heart rate up to 167, BP 96/63, noted to be in respiratory distress.  She was subsequently placed on BiPAP and admitted to stepdown unit.  Pulmonology as well as cardiology consulted.  New events last 24 hours / Subjective: Patient has been in and out of A-fib last 24 hours.  A-fib this morning rate 110s.  She admits to some congestion and cough, no shortness of breath.  Family also inquiring about getting a PET scan while patient is in the hospital.  I informed patient that PET scans are outpatient procedure only, cannot be done inpatient.  She saw Dr. Berneice Heinrich for her bladder cancer in the past, does not have an oncologist.  I offered to put in an outpatient referral to discuss PET scan as outpatient.   Assessment & Plan:   Principal Problem:   Acute respiratory failure with hypoxia (HCC) Active Problems:   Essential hypertension   OSA on CPAP   Paroxysmal atrial fibrillation with RVR (HCC)   Acute on chronic anemia   Chronic obstructive pulmonary disease (HCC)   Stage 3a chronic kidney disease (HCC)   GERD without esophagitis   Peripheral neuropathy   Acute on chronic diastolic CHF (congestive heart failure) (HCC)   Acute hypoxemic respiratory failure -Resolved, off BiPAP and currently on room air  Paroxysmal A-fib with RVR -Reportedly had  not taken metoprolol, Eliquis, flecainide on 9/29 or 9/30 due to concern about low BP -Flecainide, metoprolol --> Restart amiodarone drip -Eliquis  -Cardiology following  Acute on chronic diastolic CHF -Echo showed EF of 65 to 70%, moderate asymmetric left ventricular hypertrophy of the basal septal segment, G3 DD, normal right ventricular systolic function, left atrium severely dilated, severe MR, moderate aortic valve regurgitation.  -Holding off further diuresis due to low BP  Acute on chronic normocytic anemia -Baseline hemoglobin 8-9.  Anemia panel in 08/2022 had shown low iron and % sat.  Previous in 2018 EGD normal and colonoscopy had shown diverticulosis and polyps.  -Eliquis, continue to monitor for any signs of bleeding -FOBT negative  -Ferrous sulfate -Hemoglobin stable  Hyperlipidemia -Crestor  GERD -Protonix  OSA -CPAP nightly  History of bladder cancer, lung nodules -Refer to oncology outpatient   DVT prophylaxis:  apixaban (ELIQUIS) tablet 5 mg  Code Status: DNR Family Communication: None at bedside, left voicemail for daughter to call back Disposition Plan: Home Status is: Inpatient Remains inpatient appropriate because: Amiodarone drip    Antimicrobials:  Anti-infectives (From admission, onward)    None        Objective: Vitals:   12/27/22 0742 12/27/22 0800 12/27/22 0818 12/27/22 0819  BP:      Pulse:  (!) 114  (!) 114  Resp:  (!) 27  (!) 25  Temp: 97.9 F (36.6 C)     TempSrc: Oral  SpO2:  99% 91% 97%  Weight:      Height:        Intake/Output Summary (Last 24 hours) at 12/27/2022 1014 Last data filed at 12/27/2022 0903 Gross per 24 hour  Intake 1079.7 ml  Output --  Net 1079.7 ml   Filed Weights   12/24/22 1647 12/25/22 0500  Weight: 58.5 kg 59 kg    Examination:  General exam: Appears calm and comfortable  Respiratory system: Clear to auscultation. Respiratory effort normal. No respiratory distress. No conversational  dyspnea.  On room air Cardiovascular system: S1 & S2 heard, irregular rhythm, tachycardic up to 110, no edema Gastrointestinal system: Abdomen is nondistended, soft and nontender. Normal bowel sounds heard. Central nervous system: Alert and oriented. No focal neurological deficits. Speech clear.  Extremities: Symmetric in appearance  Skin: No rashes, lesions or ulcers on exposed skin  Psychiatry: Judgement and insight appear normal. Mood & affect appropriate.   Data Reviewed: I have personally reviewed following labs and imaging studies  CBC: Recent Labs  Lab 12/24/22 1701 12/25/22 0308 12/25/22 2025 12/26/22 0309 12/27/22 0258  WBC 7.9 10.5  --  7.0 6.7  NEUTROABS 5.8  --   --   --   --   HGB 8.4* 7.1* 8.2* 8.1* 8.7*  HCT 27.8* 23.7* 26.8* 26.3* 28.8*  MCV 96.5 97.5  --  94.6 98.0  PLT 157 137*  --  140* 146*   Basic Metabolic Panel: Recent Labs  Lab 12/24/22 1701 12/25/22 0308 12/26/22 0309 12/27/22 0258  NA 139 137 137 139  K 3.5 3.7 3.6 3.7  CL 105 106 105 104  CO2 25 23 22 26   GLUCOSE 98 124* 105* 110*  BUN 17 18 19 17   CREATININE 0.87 0.88 0.73 0.78  CALCIUM 8.8* 8.2* 8.3* 8.6*  MG 1.9  --   --   --    GFR: Estimated Creatinine Clearance: 49.5 mL/min (by C-G formula based on SCr of 0.78 mg/dL). Liver Function Tests: Recent Labs  Lab 12/24/22 1701  AST 13*  ALT 11  ALKPHOS 70  BILITOT 0.7  PROT 7.0  ALBUMIN 2.9*   No results for input(s): "LIPASE", "AMYLASE" in the last 168 hours. No results for input(s): "AMMONIA" in the last 168 hours. Coagulation Profile: No results for input(s): "INR", "PROTIME" in the last 168 hours. Cardiac Enzymes: No results for input(s): "CKTOTAL", "CKMB", "CKMBINDEX", "TROPONINI" in the last 168 hours. BNP (last 3 results) No results for input(s): "PROBNP" in the last 8760 hours. HbA1C: No results for input(s): "HGBA1C" in the last 72 hours. CBG: No results for input(s): "GLUCAP" in the last 168 hours. Lipid  Profile: No results for input(s): "CHOL", "HDL", "LDLCALC", "TRIG", "CHOLHDL", "LDLDIRECT" in the last 72 hours. Thyroid Function Tests: No results for input(s): "TSH", "T4TOTAL", "FREET4", "T3FREE", "THYROIDAB" in the last 72 hours. Anemia Panel: Recent Labs    12/25/22 1201  VITAMINB12 378  FOLATE 15.5  FERRITIN 562*  TIBC 209*  IRON 46  RETICCTPCT 3.1   Sepsis Labs: No results for input(s): "PROCALCITON", "LATICACIDVEN" in the last 168 hours.  Recent Results (from the past 240 hour(s))  MRSA Next Gen by PCR, Nasal     Status: None   Collection Time: 12/24/22 11:16 PM   Specimen: Nasal Mucosa; Nasal Swab  Result Value Ref Range Status   MRSA by PCR Next Gen NOT DETECTED NOT DETECTED Final    Comment: (NOTE) The GeneXpert MRSA Assay (FDA approved for NASAL specimens only), is one  component of a comprehensive MRSA colonization surveillance program. It is not intended to diagnose MRSA infection nor to guide or monitor treatment for MRSA infections. Test performance is not FDA approved in patients less than 7 years old. Performed at [facility], [address] [place], [place] [address]       Radiology Studies: No results found.    Scheduled Meds:  apixaban  5 mg Oral BID   budesonide (PULMICORT) nebulizer solution  0.5 mg Nebulization BID   Chlorhexidine Gluconate Cloth  6 each Topical Daily   docusate sodium  100 mg Oral Daily   ferrous sulfate  325 mg Oral Q breakfast   fluticasone  2 spray Each Nare Daily   levalbuterol  0.63 mg Nebulization BID   metoprolol succinate  12.5 mg Oral Daily   montelukast  10 mg Oral QPM   pantoprazole  40 mg Oral Daily   pregabalin  50 mg Oral BID   rosuvastatin  5 mg Oral QODAY   Continuous Infusions:  amiodarone 30 mg/hr (12/27/22 0800)     LOS: 3 days   Time spent: 25 minutes   Noralee Stain, DO [facility] 12/27/2022, 10:14 AM   Available via Epic secure chat 7am-7pm After these hours,  please refer to coverage provider listed on [email or web address]

## 2022-12-27 NOTE — Progress Notes (Signed)
   12/27/22 2035  BiPAP/CPAP/SIPAP  BiPAP/CPAP/SIPAP Pt Type Adult  BiPAP/CPAP/SIPAP Resmed  Mask Type Full face mask  Mask Size Medium  Respiratory Rate 25 breaths/min  EPAP 10 cmH2O  Flow Rate 2 lpm  Heater Temperature  (per pt, RN already helped fill humidity chamber with sterile water)  Patient Home Equipment Yes (no fryas on cord noted, machine plugged into red outlet)  Auto Titrate No  Safety Check Completed by RT for Home Unit Yes, no issues noted  BiPAP/CPAP /SiPAP Vitals  Resp (!) 25  BP (!) 134/38  Bilateral Breath Sounds Diminished;Expiratory wheezes

## 2022-12-27 NOTE — Plan of Care (Signed)
  Problem: Clinical Measurements: Goal: Respiratory complications will improve Outcome: Progressing   Problem: Activity: Goal: Risk for activity intolerance will decrease Outcome: Progressing   Problem: Pain Managment: Goal: General experience of comfort will improve Outcome: Progressing   Problem: Safety: Goal: Ability to remain free from injury will improve Outcome: Progressing   

## 2022-12-28 DIAGNOSIS — J9601 Acute respiratory failure with hypoxia: Secondary | ICD-10-CM | POA: Diagnosis not present

## 2022-12-28 LAB — CBC
HCT: 27.2 % — ABNORMAL LOW (ref 36.0–46.0)
Hemoglobin: 8.3 g/dL — ABNORMAL LOW (ref 12.0–15.0)
MCH: 29.4 pg (ref 26.0–34.0)
MCHC: 30.5 g/dL (ref 30.0–36.0)
MCV: 96.5 fL (ref 80.0–100.0)
Platelets: 151 10*3/uL (ref 150–400)
RBC: 2.82 MIL/uL — ABNORMAL LOW (ref 3.87–5.11)
RDW: 19.1 % — ABNORMAL HIGH (ref 11.5–15.5)
WBC: 8.4 10*3/uL (ref 4.0–10.5)
nRBC: 0 % (ref 0.0–0.2)

## 2022-12-28 LAB — BASIC METABOLIC PANEL
Anion gap: 10 (ref 5–15)
BUN: 20 mg/dL (ref 8–23)
CO2: 24 mmol/L (ref 22–32)
Calcium: 8.5 mg/dL — ABNORMAL LOW (ref 8.9–10.3)
Chloride: 103 mmol/L (ref 98–111)
Creatinine, Ser: 0.8 mg/dL (ref 0.44–1.00)
GFR, Estimated: >60 mL/min (ref >60)
Glucose, Bld: 122 mg/dL — ABNORMAL HIGH (ref 70–99)
Potassium: 3.8 mmol/L (ref 3.5–5.1)
Sodium: 137 mmol/L (ref 135–145)

## 2022-12-28 MED ORDER — METOPROLOL TARTRATE 25 MG PO TABS
25.0000 mg | ORAL_TABLET | Freq: Two times a day (BID) | ORAL | Status: DC
  Administered 2022-12-28 – 2023-01-01 (×8): 25 mg via ORAL
  Filled 2022-12-28 (×8): qty 1

## 2022-12-28 NOTE — Plan of Care (Signed)
  Problem: Clinical Measurements: Goal: Respiratory complications will improve Outcome: Progressing   Problem: Safety: Goal: Ability to remain free from injury will improve Outcome: Progressing   Problem: Skin Integrity: Goal: Risk for impaired skin integrity will decrease Outcome: Progressing   

## 2022-12-28 NOTE — Progress Notes (Signed)
   12/28/22 2012  BiPAP/CPAP/SIPAP  BiPAP/CPAP/SIPAP Pt Type Adult (pt was already on cpap self placement home unit)  Mask Type Full face mask  Mask Size Medium  Respiratory Rate 18 breaths/min  FiO2 (%) 21 %  Heater Temperature  (sterile water provided)  Patient Home Equipment Yes (pts home settings)  Safety Check Completed by RT for Home Unit Yes, no issues noted  BiPAP/CPAP /SiPAP Vitals  Pulse Rate 78  Resp 16  SpO2 100 %  Bilateral Breath Sounds Diminished;Expiratory wheezes  MEWS Score/Color  MEWS Score 2  MEWS Score Color Yellow

## 2022-12-28 NOTE — Progress Notes (Addendum)
PROGRESS NOTE    Dana Horton  ZOX:[id number] DOB: 02/17/1946 DOA: 12/24/2022 PCP: Crist Fat, MD     Brief Narrative:  Dana Horton is a 77 year old female with past medical history significant for A-fib, CKD, COPD, diastolic heart failure, OSA on CPAP, hypertension who presented with acute onset of palpitation, generalized weakness.  She was found to have low blood pressure in the 80s.  She was scheduled to have iron infusion but was noted to be in A-fib RVR with heart rate 1 40-1 60s, symptomatic with shortness of breath.  She was then sent to the emergency department.  In the ED, she was in A-fib RVR with heart rate up to 167, BP 96/63, noted to be in respiratory distress.  She was subsequently placed on BiPAP and admitted to stepdown unit.  Pulmonology as well as cardiology consulted.  New events last 24 hours / Subjective: Patient was in normal sinus rhythm when I first arrived in her room.  Daughters are at bedside.  During our conversation, patient had an episode of cough, and converted to A-fib RVR again rate up to 120.  Remains on IV amiodarone.  Assessment & Plan:   Principal Problem:   Acute respiratory failure with hypoxia (HCC) Active Problems:   Essential hypertension   OSA on CPAP   Paroxysmal atrial fibrillation with RVR (HCC)   Acute on chronic anemia   Chronic obstructive pulmonary disease (HCC)   Stage 3a chronic kidney disease (HCC)   GERD without esophagitis   Peripheral neuropathy   Acute on chronic diastolic CHF (congestive heart failure) (HCC)   Acute hypoxemic respiratory failure -Resolved, off BiPAP and currently on room air  Paroxysmal A-fib with RVR -Reportedly had not taken metoprolol, Eliquis, flecainide on 9/29 or 9/30 due to concern about low BP -Flecainide, metoprolol --> now on amiodarone drip, Lopressor -Eliquis  -Cardiology following  Acute on chronic diastolic CHF -Echo showed EF of 65 to 70%, moderate  asymmetric left ventricular hypertrophy of the basal septal segment, G3 DD, normal right ventricular systolic function, left atrium severely dilated, severe MR, moderate aortic valve regurgitation.  -Holding off further diuresis due to low BP  Acute on chronic normocytic anemia -Baseline hemoglobin 8-9.  Anemia panel in 08/2022 had shown low iron and % sat.  Previous in 2018 EGD normal and colonoscopy had shown diverticulosis and polyps.  -Eliquis, continue to monitor for any signs of bleeding -FOBT negative  -Ferrous sulfate -Hemoglobin stable  Hyperlipidemia -Crestor  GERD -Protonix  OSA -CPAP nightly  History of bladder cancer, lung nodules -Refer to oncology outpatient   DVT prophylaxis:  apixaban (ELIQUIS) tablet 5 mg  Code Status: DNR Family Communication: Daughters at bedside Disposition Plan: Home Status is: Inpatient Remains inpatient appropriate because: Amiodarone drip    Antimicrobials:  Anti-infectives (From admission, onward)    None        Objective: Vitals:   12/28/22 0800 12/28/22 0808 12/28/22 0810 12/28/22 0936  BP: (!) 129/35   135/63  Pulse: 72  71 (!) 117  Resp: (!) 27  (!) 22   Temp:      TempSrc:      SpO2: 96% 94% 97%   Weight:      Height:        Intake/Output Summary (Last 24 hours) at 12/28/2022 1220 Last data filed at 12/28/2022 1136 Gross per 24 hour  Intake 462.9 ml  Output --  Net 462.9 ml   Filed Weights   12/24/22 1647  12/25/22 0500  Weight: 58.5 kg 59 kg    Examination:  General exam: Appears calm and comfortable  Respiratory system: Clear to auscultation. Respiratory effort normal. No respiratory distress. No conversational dyspnea.  On room air Cardiovascular system: S1 & S2 heard, irregular rhythm, tachycardic up to 120, no edema Gastrointestinal system: Abdomen is nondistended, soft and nontender. Normal bowel sounds heard. Central nervous system: Alert and oriented. No focal neurological deficits. Speech  clear.  Extremities: Symmetric in appearance  Skin: No rashes, lesions or ulcers on exposed skin  Psychiatry: Judgement and insight appear normal. Mood & affect appropriate.   Data Reviewed: I have personally reviewed following labs and imaging studies  CBC: Recent Labs  Lab 12/24/22 1701 12/25/22 0308 12/25/22 2025 12/26/22 0309 12/27/22 0258 12/28/22 0306  WBC 7.9 10.5  --  7.0 6.7 8.4  NEUTROABS 5.8  --   --   --   --   --   HGB 8.4* 7.1* 8.2* 8.1* 8.7* 8.3*  HCT 27.8* 23.7* 26.8* 26.3* 28.8* 27.2*  MCV 96.5 97.5  --  94.6 98.0 96.5  PLT 157 137*  --  140* 146* 151   Basic Metabolic Panel: Recent Labs  Lab 12/24/22 1701 12/25/22 0308 12/26/22 0309 12/27/22 0258 12/28/22 0306  NA 139 137 137 139 137  K 3.5 3.7 3.6 3.7 3.8  CL 105 106 105 104 103  CO2 25 23 22 26 24   GLUCOSE 98 124* 105* 110* 122*  BUN 17 18 19 17 20   CREATININE 0.87 0.88 0.73 0.78 0.80  CALCIUM 8.8* 8.2* 8.3* 8.6* 8.5*  MG 1.9  --   --   --   --    GFR: Estimated Creatinine Clearance: 49.5 mL/min (by C-G formula based on SCr of 0.8 mg/dL). Liver Function Tests: Recent Labs  Lab 12/24/22 1701  AST 13*  ALT 11  ALKPHOS 70  BILITOT 0.7  PROT 7.0  ALBUMIN 2.9*   No results for input(s): "LIPASE", "AMYLASE" in the last 168 hours. No results for input(s): "AMMONIA" in the last 168 hours. Coagulation Profile: No results for input(s): "INR", "PROTIME" in the last 168 hours. Cardiac Enzymes: No results for input(s): "CKTOTAL", "CKMB", "CKMBINDEX", "TROPONINI" in the last 168 hours. BNP (last 3 results) No results for input(s): "PROBNP" in the last 8760 hours. HbA1C: No results for input(s): "HGBA1C" in the last 72 hours. CBG: No results for input(s): "GLUCAP" in the last 168 hours. Lipid Profile: No results for input(s): "CHOL", "HDL", "LDLCALC", "TRIG", "CHOLHDL", "LDLDIRECT" in the last 72 hours. Thyroid Function Tests: No results for input(s): "TSH", "T4TOTAL", "FREET4", "T3FREE",  "THYROIDAB" in the last 72 hours. Anemia Panel: No results for input(s): "VITAMINB12", "FOLATE", "FERRITIN", "TIBC", "IRON", "RETICCTPCT" in the last 72 hours.  Sepsis Labs: No results for input(s): "PROCALCITON", "LATICACIDVEN" in the last 168 hours.  Recent Results (from the past 240 hour(s))  MRSA Next Gen by PCR, Nasal     Status: None   Collection Time: 12/24/22 11:16 PM   Specimen: Nasal Mucosa; Nasal Swab  Result Value Ref Range Status   MRSA by PCR Next Gen NOT DETECTED NOT DETECTED Final    Comment: (NOTE) The GeneXpert MRSA Assay (FDA approved for NASAL specimens only), is one component of a comprehensive MRSA colonization surveillance program. It is not intended to diagnose MRSA infection nor to guide or monitor treatment for MRSA infections. Test performance is not FDA approved in patients less than 69 years old. Performed at [facility], [address] [place], [place] [address]       Radiology Studies: No results found.    Scheduled Meds:  apixaban  5 mg Oral BID   budesonide (PULMICORT) nebulizer solution  0.5 mg Nebulization BID   Chlorhexidine Gluconate Cloth  6 each Topical Daily   docusate sodium  100 mg Oral Daily   ferrous sulfate  325 mg Oral Q breakfast   fluticasone  2 spray Each Nare Daily   levalbuterol  0.63 mg Nebulization BID   metoprolol tartrate  25 mg Oral BID   montelukast  10 mg Oral QPM   pantoprazole  40 mg Oral Daily   pregabalin  50 mg Oral BID   rosuvastatin  5 mg Oral QODAY   Continuous Infusions:  amiodarone 30 mg/hr (12/28/22 1136)     LOS: 4 days   Time spent: 25 minutes   Noralee Stain, DO [facility] 12/28/2022, 12:20 PM   Available via Epic secure chat 7am-7pm After these hours, please refer to coverage provider listed on [email or web address]

## 2022-12-28 NOTE — Progress Notes (Signed)
Progress Note  Patient Name: Dana Horton Date of Encounter: 12/28/2022  Primary Cardiologist:   Norman Herrlich, MD   Subjective  Breathing is better though not at baseline  Inpatient Medications    Scheduled Meds:  apixaban  5 mg Oral BID   budesonide (PULMICORT) nebulizer solution  0.5 mg Nebulization BID   Chlorhexidine Gluconate Cloth  6 each Topical Daily   docusate sodium  100 mg Oral Daily   ferrous sulfate  325 mg Oral Q breakfast   fluticasone  2 spray Each Nare Daily   levalbuterol  0.63 mg Nebulization BID   metoprolol succinate  25 mg Oral Daily   montelukast  10 mg Oral QPM   pantoprazole  40 mg Oral Daily   pregabalin  50 mg Oral BID   rosuvastatin  5 mg Oral QODAY   Continuous Infusions:  amiodarone 30 mg/hr (12/28/22 0954)   PRN Meds: acetaminophen **OR** acetaminophen, magnesium hydroxide, ondansetron **OR** ondansetron (ZOFRAN) IV, mouth rinse, traZODone   Vital Signs    Vitals:   12/28/22 0800 12/28/22 0808 12/28/22 0810 12/28/22 0936  BP:    135/63  Pulse:   71 (!) 117  Resp:   (!) 22   Temp: 99 F (37.2 C)     TempSrc: Other (Comment)     SpO2:  94% 97%   Weight:      Height:        Intake/Output Summary (Last 24 hours) at 12/28/2022 0957 Last data filed at 12/28/2022 0102 Gross per 24 hour  Intake 404.27 ml  Output --  Net 404.27 ml   Filed Weights   12/24/22 1647 12/25/22 0500  Weight: 58.5 kg 59 kg    Telemetry   PAF:   Personally Reviewed  ECG    N/A- Personally Reviewed  Physical Exam   GEN: No  acute distress.   Neck: No  JVD Cardiac: Irregular RR, 3/6 apical systolic murmur, no diastolic murmurs, rubs, or gallops.  Respiratory:    Decreased breath sounds with diffuse coarse crackles GI: Soft, nontender, non-distended, normal bowel sounds  MS:  no edema; No deformity. Neuro:   Nonfocal  Psych: Oriented and appropriate    Labs    Chemistry Recent Labs  Lab 12/24/22 1701 12/25/22 0308  12/26/22 0309 12/27/22 0258 12/28/22 0306  NA 139   < > 137 139 137  K 3.5   < > 3.6 3.7 3.8  CL 105   < > 105 104 103  CO2 25   < > 22 26 24   GLUCOSE 98   < > 105* 110* 122*  BUN 17   < > 19 17 20   CREATININE 0.87   < > 0.73 0.78 0.80  CALCIUM 8.8*   < > 8.3* 8.6* 8.5*  PROT 7.0  --   --   --   --   ALBUMIN 2.9*  --   --   --   --   AST 13*  --   --   --   --   ALT 11  --   --   --   --   ALKPHOS 70  --   --   --   --   BILITOT 0.7  --   --   --   --   GFRNONAA >60   < > >60 >60 >60  ANIONGAP 9   < > 10 9 10    < > = values in this interval not displayed.  Hematology Recent Labs  Lab 12/26/22 0309 12/27/22 0258 12/28/22 0306  WBC 7.0 6.7 8.4  RBC 2.78* 2.94* 2.82*  HGB 8.1* 8.7* 8.3*  HCT 26.3* 28.8* 27.2*  MCV 94.6 98.0 96.5  MCH 29.1 29.6 29.4  MCHC 30.8 30.2 30.5  RDW 18.9* 19.3* 19.1*  PLT 140* 146* 151    Cardiac EnzymesNo results for input(s): "TROPONINI" in the last 168 hours. No results for input(s): "TROPIPOC" in the last 168 hours.   BNP Recent Labs  Lab 12/24/22 1701  BNP 1,180.3*     DDimer No results for input(s): "DDIMER" in the last 168 hours.   Radiology    No results found.  Cardiac Studies   Echo:  1. Left ventricular ejection fraction, by estimation, is 65 to 70%. The  left ventricle has normal function. The left ventricle has no regional  wall motion abnormalities. There is moderate asymmetric left ventricular  hypertrophy of the basal-septal  segment. Left ventricular diastolic parameters are consistent with Grade  III diastolic dysfunction (restrictive).   2. Right ventricular systolic function is normal. The right ventricular  size is normal. There is moderately elevated pulmonary artery systolic  pressure. The estimated right ventricular systolic pressure is 59.3 mmHg.   3. Left atrial size was severely dilated.   4. Right atrial size was moderately dilated.   5. The mitral valve is thickened with mildly restricted  leaflet motion.  The left atrium is severely dialted with dialtion of the MV annulus. There  is severe central MR. The mitral valve is abnormal. Severe mitral valve  regurgitation. No evidence of  mitral stenosis.   6. Tricuspid valve regurgitation is moderate.   7. The aortic valve is tricuspid. Aortic valve regurgitation is mild to  moderate. No aortic stenosis is present. Aortic valve area, by VTI  measures 2.45 cm. Aortic valve mean gradient measures 9.0 mmHg. Aortic  valve Vmax measures 2.03 m/s.   8. The inferior vena cava is dilated in size with <50% respiratory  variability, suggesting right atrial pressure of 15 mmHg.    Patient Profile     77 y.o. female  with a hx of atrial fibrillation, HFpEF, HTN, tobacco use, COPD, CKD stage IIIa, GERD, bladder cancer, HLD who is being seen 12/25/2022 for the evaluation of atrial fibrillation, CHF at the request of Dr. Isidoro Donning.   Assessment & Plan    Paroxysmal Atrial Fibrillation :  Still in fib.  Continue IV amio again today.  Will eventually transition to PO.  She is tolerating Eliquis.   I will increase the beta blocker to day for rate control as her pressure will allow me to up titrate.  No plan for cardioversion since this has been PAF.  I explained to the family that atrial fib ablation is not an acute therapy.    Acute Respiratory Failure with hypoxemia :  Seems to be breathing better.   Continue management per primary team.   Acute on Chronic Diastolic Heart Failure :  Echo results as above.  Medical management.   Severe MR: Medical management.  HTN : BP has actually been low.   Now going back up and allowing me to titrate beta blocker.  Avapro stopped this admission.     For questions or updates, please contact [facility] Please consult [email or web address] for contact info under Cardiology/STEMI.   Signed, Rollene Rotunda, MD  12/28/2022, 9:57 AM

## 2022-12-28 NOTE — Plan of Care (Signed)

## 2022-12-29 DIAGNOSIS — J9601 Acute respiratory failure with hypoxia: Secondary | ICD-10-CM | POA: Diagnosis not present

## 2022-12-29 LAB — BASIC METABOLIC PANEL
Anion gap: 8 (ref 5–15)
BUN: 21 mg/dL (ref 8–23)
CO2: 24 mmol/L (ref 22–32)
Calcium: 8.7 mg/dL — ABNORMAL LOW (ref 8.9–10.3)
Chloride: 102 mmol/L (ref 98–111)
Creatinine, Ser: 0.67 mg/dL (ref 0.44–1.00)
GFR, Estimated: >60 mL/min (ref >60)
Glucose, Bld: 103 mg/dL — ABNORMAL HIGH (ref 70–99)
Potassium: 3.6 mmol/L (ref 3.5–5.1)
Sodium: 134 mmol/L — ABNORMAL LOW (ref 135–145)

## 2022-12-29 LAB — CBC
HCT: 26.9 % — ABNORMAL LOW (ref 36.0–46.0)
Hemoglobin: 8.3 g/dL — ABNORMAL LOW (ref 12.0–15.0)
MCH: 29.4 pg (ref 26.0–34.0)
MCHC: 30.9 g/dL (ref 30.0–36.0)
MCV: 95.4 fL (ref 80.0–100.0)
Platelets: 151 10*3/uL (ref 150–400)
RBC: 2.82 MIL/uL — ABNORMAL LOW (ref 3.87–5.11)
RDW: 19.2 % — ABNORMAL HIGH (ref 11.5–15.5)
WBC: 7.9 10*3/uL (ref 4.0–10.5)
nRBC: 0 % (ref 0.0–0.2)

## 2022-12-29 MED ORDER — AMIODARONE HCL 200 MG PO TABS
400.0000 mg | ORAL_TABLET | Freq: Two times a day (BID) | ORAL | Status: AC
  Administered 2022-12-29 – 2023-01-04 (×14): 400 mg via ORAL
  Filled 2022-12-29 (×14): qty 2

## 2022-12-29 NOTE — Progress Notes (Signed)
PROGRESS NOTE    Dana Horton  UUV:[id number] DOB: 02-22-1946 DOA: 12/24/2022 PCP: Crist Fat, MD     Brief Narrative:  Dana Horton is a 77 year old female with past medical history significant for A-fib, CKD, COPD, diastolic heart failure, OSA on CPAP, hypertension who presented with acute onset of palpitation, generalized weakness.  She was found to have low blood pressure in the 80s.  She was scheduled to have iron infusion but was noted to be in A-fib RVR with heart rate 1 40-1 60s, symptomatic with shortness of breath.  She was then sent to the emergency department.  In the ED, she was in A-fib RVR with heart rate up to 167, BP 96/63, noted to be in respiratory distress.  She was subsequently placed on BiPAP and admitted to stepdown unit.  Pulmonology as well as cardiology consulted.  New events last 24 hours / Subjective: Patient feeling well overall.  In paroxysmal A-fib, irregular rhythm with heart rate 100 during my examination.  Discussed PT OT.  Assessment & Plan:   Principal Problem:   Acute respiratory failure with hypoxia (HCC) Active Problems:   Essential hypertension   OSA on CPAP   Paroxysmal atrial fibrillation with RVR (HCC)   Acute on chronic anemia   Chronic obstructive pulmonary disease (HCC)   Stage 3a chronic kidney disease (HCC)   GERD without esophagitis   Peripheral neuropathy   Acute on chronic diastolic CHF (congestive heart failure) (HCC)   Acute hypoxemic respiratory failure -Resolved, off BiPAP and currently on room air  Paroxysmal A-fib with RVR -Reportedly had not taken metoprolol, Eliquis, flecainide on 9/29 or 9/30 due to concern about low BP -Flecainide, metoprolol --> PO amiodarone, Lopressor -Eliquis  -Cardiology following  Acute on chronic diastolic CHF -Echo showed EF of 65 to 70%, moderate asymmetric left ventricular hypertrophy of the basal septal segment, G3 DD, normal right ventricular systolic  function, left atrium severely dilated, severe MR, moderate aortic valve regurgitation.  -Stable   Acute on chronic normocytic anemia -Baseline hemoglobin 8-9.  Anemia panel in 08/2022 had shown low iron and % sat.  Previous in 2018 EGD normal and colonoscopy had shown diverticulosis and polyps.  -Eliquis, continue to monitor for any signs of bleeding -FOBT negative  -Ferrous sulfate -Hemoglobin stable  Hyperlipidemia -Crestor  GERD -Protonix  OSA -CPAP nightly  History of bladder cancer, lung nodules -Refer to oncology outpatient   DVT prophylaxis:  apixaban (ELIQUIS) tablet 5 mg  Code Status: DNR Family Communication: Daughters 10/4 Disposition Plan: Home Status is: Inpatient Remains inpatient appropriate because: Continue rate control, PT OT    Antimicrobials:  Anti-infectives (From admission, onward)    None        Objective: Vitals:   12/29/22 0900 12/29/22 1000 12/29/22 1100 12/29/22 1206  BP: (!) 115/52 (!) 112/26 (!) 113/27   Pulse: (!) 110 74 65   Resp: (!) 23 (!) 22 20   Temp:    98.1 F (36.7 C)  TempSrc:    Oral  SpO2: 95% 97% 96%   Weight:      Height:        Intake/Output Summary (Last 24 hours) at 12/29/2022 1210 Last data filed at 12/29/2022 0856 Gross per 24 hour  Intake 347.86 ml  Output --  Net 347.86 ml   Filed Weights   12/24/22 1647 12/25/22 0500 12/29/22 0500  Weight: 58.5 kg 59 kg 59.2 kg    Examination:  General exam: Appears calm and comfortable  Respiratory system: Clear to auscultation. Respiratory effort normal. No respiratory distress. No conversational dyspnea.  On room air Cardiovascular system: S1 & S2 heard, irregular rhythm, tachycardic up to 100s, no edema Gastrointestinal system: Abdomen is nondistended, soft and nontender. Normal bowel sounds heard. Central nervous system: Alert and oriented. No focal neurological deficits. Speech clear.  Extremities: Symmetric in appearance  Skin: No rashes, lesions or  ulcers on exposed skin  Psychiatry: Judgement and insight appear normal. Mood & affect appropriate.   Data Reviewed: I have personally reviewed following labs and imaging studies  CBC: Recent Labs  Lab 12/24/22 1701 12/25/22 0308 12/25/22 2025 12/26/22 0309 12/27/22 0258 12/28/22 0306 12/29/22 0339  WBC 7.9 10.5  --  7.0 6.7 8.4 7.9  NEUTROABS 5.8  --   --   --   --   --   --   HGB 8.4* 7.1* 8.2* 8.1* 8.7* 8.3* 8.3*  HCT 27.8* 23.7* 26.8* 26.3* 28.8* 27.2* 26.9*  MCV 96.5 97.5  --  94.6 98.0 96.5 95.4  PLT 157 137*  --  140* 146* 151 151   Basic Metabolic Panel: Recent Labs  Lab 12/24/22 1701 12/25/22 0308 12/26/22 0309 12/27/22 0258 12/28/22 0306 12/29/22 0339  NA 139 137 137 139 137 134*  K 3.5 3.7 3.6 3.7 3.8 3.6  CL 105 106 105 104 103 102  CO2 25 23 22 26 24 24   GLUCOSE 98 124* 105* 110* 122* 103*  BUN 17 18 19 17 20 21   CREATININE 0.87 0.88 0.73 0.78 0.80 0.67  CALCIUM 8.8* 8.2* 8.3* 8.6* 8.5* 8.7*  MG 1.9  --   --   --   --   --    GFR: Estimated Creatinine Clearance: 49.5 mL/min (by C-G formula based on SCr of 0.67 mg/dL). Liver Function Tests: Recent Labs  Lab 12/24/22 1701  AST 13*  ALT 11  ALKPHOS 70  BILITOT 0.7  PROT 7.0  ALBUMIN 2.9*   No results for input(s): "LIPASE", "AMYLASE" in the last 168 hours. No results for input(s): "AMMONIA" in the last 168 hours. Coagulation Profile: No results for input(s): "INR", "PROTIME" in the last 168 hours. Cardiac Enzymes: No results for input(s): "CKTOTAL", "CKMB", "CKMBINDEX", "TROPONINI" in the last 168 hours. BNP (last 3 results) No results for input(s): "PROBNP" in the last 8760 hours. HbA1C: No results for input(s): "HGBA1C" in the last 72 hours. CBG: No results for input(s): "GLUCAP" in the last 168 hours. Lipid Profile: No results for input(s): "CHOL", "HDL", "LDLCALC", "TRIG", "CHOLHDL", "LDLDIRECT" in the last 72 hours. Thyroid Function Tests: No results for input(s): "TSH", "T4TOTAL",  "FREET4", "T3FREE", "THYROIDAB" in the last 72 hours. Anemia Panel: No results for input(s): "VITAMINB12", "FOLATE", "FERRITIN", "TIBC", "IRON", "RETICCTPCT" in the last 72 hours.  Sepsis Labs: No results for input(s): "PROCALCITON", "LATICACIDVEN" in the last 168 hours.  Recent Results (from the past 240 hour(s))  MRSA Next Gen by PCR, Nasal     Status: None   Collection Time: 12/24/22 11:16 PM   Specimen: Nasal Mucosa; Nasal Swab  Result Value Ref Range Status   MRSA by PCR Next Gen NOT DETECTED NOT DETECTED Final    Comment: (NOTE) The GeneXpert MRSA Assay (FDA approved for NASAL specimens only), is one component of a comprehensive MRSA colonization surveillance program. It is not intended to diagnose MRSA infection nor to guide or monitor treatment for MRSA infections. Test performance is not FDA approved in patients less than 62 years old. Performed at [facility], [address] [place], [place] [address]       Radiology Studies: No results found.    Scheduled Meds:  amiodarone  400 mg Oral BID   apixaban  5 mg Oral BID   budesonide (PULMICORT) nebulizer solution  0.5 mg Nebulization BID   Chlorhexidine Gluconate Cloth  6 each Topical Daily   docusate sodium  100 mg Oral Daily   ferrous sulfate  325 mg Oral Q breakfast   fluticasone  2 spray Each Nare Daily   levalbuterol  0.63 mg Nebulization BID   metoprolol tartrate  25 mg Oral BID   montelukast  10 mg Oral QPM   pantoprazole  40 mg Oral Daily   pregabalin  50 mg Oral BID   rosuvastatin  5 mg Oral QODAY   Continuous Infusions:     LOS: 5 days   Time spent: 25 minutes   Noralee Stain, DO [facility] 12/29/2022, 12:10 PM   Available via Epic secure chat 7am-7pm After these hours, please refer to coverage provider listed on [email or web address]

## 2022-12-29 NOTE — Progress Notes (Signed)
Progress Note  Patient Name: Dana Horton Date of Encounter: 12/29/2022  Primary Cardiologist:   Norman Herrlich, MD   Subjective  No chest pain.  Still with cough.  Breathing is improved but not at baseline.    Inpatient Medications    Scheduled Meds:  apixaban  5 mg Oral BID   budesonide (PULMICORT) nebulizer solution  0.5 mg Nebulization BID   Chlorhexidine Gluconate Cloth  6 each Topical Daily   docusate sodium  100 mg Oral Daily   ferrous sulfate  325 mg Oral Q breakfast   fluticasone  2 spray Each Nare Daily   levalbuterol  0.63 mg Nebulization BID   metoprolol tartrate  25 mg Oral BID   montelukast  10 mg Oral QPM   pantoprazole  40 mg Oral Daily   pregabalin  50 mg Oral BID   rosuvastatin  5 mg Oral QODAY   Continuous Infusions:  amiodarone 30 mg/hr (12/29/22 0500)   PRN Meds: acetaminophen **OR** acetaminophen, magnesium hydroxide, ondansetron **OR** ondansetron (ZOFRAN) IV, mouth rinse, traZODone   Vital Signs    Vitals:   12/29/22 0400 12/29/22 0500 12/29/22 0508 12/29/22 0600  BP: (!) 122/34  (!) 118/33 (!) 120/34  Pulse: 70  68 71  Resp: (!) 21  17 (!) 23  Temp:      TempSrc:      SpO2: 95%  96% 97%  Weight:  59.2 kg    Height:        Intake/Output Summary (Last 24 hours) at 12/29/2022 0727 Last data filed at 12/29/2022 0500 Gross per 24 hour  Intake 410.17 ml  Output --  Net 410.17 ml   Filed Weights   12/24/22 1647 12/25/22 0500 12/29/22 0500  Weight: 58.5 kg 59 kg 59.2 kg    Telemetry   NSR (PAF):   Personally Reviewed  ECG    N/A- Personally Reviewed  Physical Exam   GEN: No  acute distress.   Neck: No  JVD Cardiac: RRR, 3/6 apical systolic murmur, no diastolic murmurs, rubs, or gallops.  Respiratory:    Decreased breath sounds on the right with coarse crackles.  GI: Soft, nontender, non-distended, normal bowel sounds  MS:  No edema; No deformity. Neuro:   Nonfocal  Psych: Oriented and appropriate      Labs    Chemistry Recent Labs  Lab 12/24/22 1701 12/25/22 0308 12/27/22 0258 12/28/22 0306 12/29/22 0339  NA 139   < > 139 137 134*  K 3.5   < > 3.7 3.8 3.6  CL 105   < > 104 103 102  CO2 25   < > 26 24 24   GLUCOSE 98   < > 110* 122* 103*  BUN 17   < > 17 20 21   CREATININE 0.87   < > 0.78 0.80 0.67  CALCIUM 8.8*   < > 8.6* 8.5* 8.7*  PROT 7.0  --   --   --   --   ALBUMIN 2.9*  --   --   --   --   AST 13*  --   --   --   --   ALT 11  --   --   --   --   ALKPHOS 70  --   --   --   --   BILITOT 0.7  --   --   --   --   GFRNONAA >60   < > >60 >60 >60  ANIONGAP 9   < >  9 10 8    < > = values in this interval not displayed.     Hematology Recent Labs  Lab 12/27/22 0258 12/28/22 0306 12/29/22 0339  WBC 6.7 8.4 7.9  RBC 2.94* 2.82* 2.82*  HGB 8.7* 8.3* 8.3*  HCT 28.8* 27.2* 26.9*  MCV 98.0 96.5 95.4  MCH 29.6 29.4 29.4  MCHC 30.2 30.5 30.9  RDW 19.3* 19.1* 19.2*  PLT 146* 151 151    Cardiac EnzymesNo results for input(s): "TROPONINI" in the last 168 hours. No results for input(s): "TROPIPOC" in the last 168 hours.   BNP Recent Labs  Lab 12/24/22 1701  BNP 1,180.3*     DDimer No results for input(s): "DDIMER" in the last 168 hours.   Radiology    No results found.  Cardiac Studies   Echo:  1. Left ventricular ejection fraction, by estimation, is 65 to 70%. The  left ventricle has normal function. The left ventricle has no regional  wall motion abnormalities. There is moderate asymmetric left ventricular  hypertrophy of the basal-septal  segment. Left ventricular diastolic parameters are consistent with Grade  III diastolic dysfunction (restrictive).   2. Right ventricular systolic function is normal. The right ventricular  size is normal. There is moderately elevated pulmonary artery systolic  pressure. The estimated right ventricular systolic pressure is 59.3 mmHg.   3. Left atrial size was severely dilated.   4. Right atrial size was  moderately dilated.   5. The mitral valve is thickened with mildly restricted leaflet motion.  The left atrium is severely dialted with dialtion of the MV annulus. There  is severe central MR. The mitral valve is abnormal. Severe mitral valve  regurgitation. No evidence of  mitral stenosis.   6. Tricuspid valve regurgitation is moderate.   7. The aortic valve is tricuspid. Aortic valve regurgitation is mild to  moderate. No aortic stenosis is present. Aortic valve area, by VTI  measures 2.45 cm. Aortic valve mean gradient measures 9.0 mmHg. Aortic  valve Vmax measures 2.03 m/s.   8. The inferior vena cava is dilated in size with <50% respiratory  variability, suggesting right atrial pressure of 15 mmHg.    Patient Profile     77 y.o. female  with a hx of atrial fibrillation, HFpEF, HTN, tobacco use, COPD, CKD stage IIIa, GERD, bladder cancer, HLD who is being seen 12/25/2022 for the evaluation of atrial fibrillation, CHF at the request of Dr. Isidoro Donning.   Assessment & Plan    Paroxysmal Atrial Fibrillation :  PAF  Continue Eliquis.   Currently in NSR.  I will change to PO Lasix  Acute Respiratory Failure with hypoxemia :    Continue therapy per primary team.    Acute on Chronic Diastolic Heart Failure :  Echo results as above.  I will follow clinically as an outpatient.  Holding diuresis although at discharge she will need at least PRN 20 mg Lasix.   Severe MR: Medical management.  HTN :   Tolerating in creased dose beta blocker.     For questions or updates, please contact [facility] Please consult [email or web address] for contact info under Cardiology/STEMI.   Signed, Rollene Rotunda, MD  12/29/2022, 7:27 AM

## 2022-12-29 NOTE — Plan of Care (Signed)
  Problem: Education: Goal: Ability to demonstrate management of disease process will improve Outcome: Progressing   Problem: Activity: Goal: Capacity to carry out activities will improve Outcome: Progressing   Problem: Health Behavior/Discharge Planning: Goal: Ability to manage health-related needs will improve Outcome: Progressing   Problem: Activity: Goal: Risk for activity intolerance will decrease Outcome: Progressing   Problem: Nutrition: Goal: Adequate nutrition will be maintained Outcome: Progressing   Problem: Coping: Goal: Level of anxiety will decrease Outcome: Progressing

## 2022-12-29 NOTE — Progress Notes (Signed)
   12/29/22 2213  BiPAP/CPAP/SIPAP  BiPAP/CPAP/SIPAP Pt Type Adult  BiPAP/CPAP/SIPAP Resmed  Mask Type Full face mask  FiO2 (%) 21 %  Patient Home Equipment Yes  BiPAP/CPAP /SiPAP Vitals  Resp (!) 27  MEWS Score/Color  MEWS Score 4  MEWS Score Color Red

## 2022-12-29 NOTE — Plan of Care (Signed)
  Problem: Education: Goal: Ability to demonstrate management of disease process will improve Outcome: Progressing   Problem: Cardiac: Goal: Ability to achieve and maintain adequate cardiopulmonary perfusion will improve Outcome: Progressing   Problem: Clinical Measurements: Goal: Ability to maintain clinical measurements within normal limits will improve Outcome: Progressing   Problem: Education: Goal: Ability to verbalize understanding of medication therapies will improve Outcome: Adequate for Discharge   Problem: Activity: Goal: Capacity to carry out activities will improve Outcome: Adequate for Discharge   Problem: Education: Goal: Knowledge of General Education information will improve Description: Including pain rating scale, medication(s)/side effects and non-pharmacologic comfort measures Outcome: Adequate for Discharge

## 2022-12-30 DIAGNOSIS — J9601 Acute respiratory failure with hypoxia: Secondary | ICD-10-CM | POA: Diagnosis not present

## 2022-12-30 LAB — BASIC METABOLIC PANEL
Anion gap: 9 (ref 5–15)
BUN: 23 mg/dL (ref 8–23)
CO2: 25 mmol/L (ref 22–32)
Calcium: 8.9 mg/dL (ref 8.9–10.3)
Chloride: 103 mmol/L (ref 98–111)
Creatinine, Ser: 0.73 mg/dL (ref 0.44–1.00)
GFR, Estimated: >60 mL/min (ref >60)
Glucose, Bld: 100 mg/dL — ABNORMAL HIGH (ref 70–99)
Potassium: 3.8 mmol/L (ref 3.5–5.1)
Sodium: 137 mmol/L (ref 135–145)

## 2022-12-30 LAB — CBC
HCT: 26.5 % — ABNORMAL LOW (ref 36.0–46.0)
Hemoglobin: 8.2 g/dL — ABNORMAL LOW (ref 12.0–15.0)
MCH: 29.7 pg (ref 26.0–34.0)
MCHC: 30.9 g/dL (ref 30.0–36.0)
MCV: 96 fL (ref 80.0–100.0)
Platelets: 151 10*3/uL (ref 150–400)
RBC: 2.76 MIL/uL — ABNORMAL LOW (ref 3.87–5.11)
RDW: 19.3 % — ABNORMAL HIGH (ref 11.5–15.5)
WBC: 7.8 10*3/uL (ref 4.0–10.5)
nRBC: 0 % (ref 0.0–0.2)

## 2022-12-30 NOTE — Progress Notes (Addendum)
PROGRESS NOTE    Dana Horton  JKK:[id number] DOB: 13-Oct-1945 DOA: 12/24/2022 PCP: Crist Fat, MD     Brief Narrative:  Dana Horton is a 77 year old female with past medical history significant for A-fib, CKD, COPD, diastolic heart failure, OSA on CPAP, hypertension who presented with acute onset of palpitation, generalized weakness.  She was found to have low blood pressure in the 80s.  She was scheduled to have iron infusion but was noted to be in A-fib RVR with heart rate 1 40-1 60s, symptomatic with shortness of breath.  She was then sent to the emergency department.  In the ED, she was in A-fib RVR with heart rate up to 167, BP 96/63, noted to be in respiratory distress.  She was subsequently placed on BiPAP and admitted to stepdown unit.  Pulmonology as well as cardiology consulted.  Patient required amiodarone drip.  New events last 24 hours / Subjective: Normal sinus rhythm today.  Recommended mobilization, hopeful discharge home 1 to 2 days. Filled out daughter's FMLA form today   Assessment & Plan:   Principal Problem:   Acute respiratory failure with hypoxia (HCC) Active Problems:   Essential hypertension   OSA on CPAP   Paroxysmal atrial fibrillation with RVR (HCC)   Acute on chronic anemia   Chronic obstructive pulmonary disease (HCC)   Stage 3a chronic kidney disease (HCC)   GERD without esophagitis   Peripheral neuropathy   Acute on chronic diastolic CHF (congestive heart failure) (HCC)   Acute hypoxemic respiratory failure -Resolved, off BiPAP and currently on room air  Paroxysmal A-fib with RVR -Reportedly had not taken metoprolol, Eliquis, flecainide on 9/29 or 9/30 due to concern about low BP -Flecainide, metoprolol --> PO amiodarone, Lopressor -Eliquis   Acute on chronic diastolic CHF -Echo showed EF of 65 to 70%, moderate asymmetric left ventricular hypertrophy of the basal septal segment, G3 DD, normal right  ventricular systolic function, left atrium severely dilated, severe MR, moderate aortic valve regurgitation.  -Stable   Acute on chronic normocytic anemia -Baseline hemoglobin 8-9.  Anemia panel in 08/2022 had shown low iron and % sat.  Previous in 2018 EGD normal and colonoscopy had shown diverticulosis and polyps.  -Eliquis, continue to monitor for any signs of bleeding -FOBT negative  -Ferrous sulfate -Hemoglobin stable  Hyperlipidemia -Crestor  GERD -Protonix  OSA -CPAP nightly  History of bladder cancer, lung nodules -Refer to oncology outpatient   DVT prophylaxis:  apixaban (ELIQUIS) tablet 5 mg  Code Status: DNR Family Communication: No family at bedside Disposition Plan: Home Status is: Inpatient Remains inpatient appropriate because: Need PT OT   Antimicrobials:  Anti-infectives (From admission, onward)    None        Objective: Vitals:   12/30/22 0642 12/30/22 0713 12/30/22 0757 12/30/22 1055  BP: (!) 95/38   (!) 105/42  Pulse: 63   66  Resp: 20     Temp: 97.6 F (36.4 C)   98.3 F (36.8 C)  TempSrc: Oral   Oral  SpO2: 95%  96% 94%  Weight:  59.2 kg    Height:        Intake/Output Summary (Last 24 hours) at 12/30/2022 1217 Last data filed at 12/30/2022 1127 Gross per 24 hour  Intake 320 ml  Output 150 ml  Net 170 ml   Filed Weights   12/25/22 0500 12/29/22 0500 12/30/22 0713  Weight: 59 kg 59.2 kg 59.2 kg    Examination:  General exam: Appears  calm and comfortable  Respiratory system: Clear to auscultation. Respiratory effort normal. No respiratory distress. No conversational dyspnea.  On room air Cardiovascular system: S1 & S2 heard, RR, no edema Gastrointestinal system: Abdomen is nondistended, soft and nontender. Normal bowel sounds heard. Central nervous system: Alert and oriented. No focal neurological deficits. Speech clear.  Extremities: Symmetric in appearance  Skin: No rashes, lesions or ulcers on exposed skin  Psychiatry:  Judgement and insight appear normal. Mood & affect appropriate.   Data Reviewed: I have personally reviewed following labs and imaging studies  CBC: Recent Labs  Lab 12/24/22 1701 12/25/22 0308 12/26/22 0309 12/27/22 0258 12/28/22 0306 12/29/22 0339 12/30/22 0521  WBC 7.9   < > 7.0 6.7 8.4 7.9 7.8  NEUTROABS 5.8  --   --   --   --   --   --   HGB 8.4*   < > 8.1* 8.7* 8.3* 8.3* 8.2*  HCT 27.8*   < > 26.3* 28.8* 27.2* 26.9* 26.5*  MCV 96.5   < > 94.6 98.0 96.5 95.4 96.0  PLT 157   < > 140* 146* 151 151 151   < > = values in this interval not displayed.   Basic Metabolic Panel: Recent Labs  Lab 12/24/22 1701 12/25/22 0308 12/26/22 0309 12/27/22 0258 12/28/22 0306 12/29/22 0339 12/30/22 0521  NA 139   < > 137 139 137 134* 137  K 3.5   < > 3.6 3.7 3.8 3.6 3.8  CL 105   < > 105 104 103 102 103  CO2 25   < > 22 26 24 24 25   GLUCOSE 98   < > 105* 110* 122* 103* 100*  BUN 17   < > 19 17 20 21 23   CREATININE 0.87   < > 0.73 0.78 0.80 0.67 0.73  CALCIUM 8.8*   < > 8.3* 8.6* 8.5* 8.7* 8.9  MG 1.9  --   --   --   --   --   --    < > = values in this interval not displayed.   GFR: Estimated Creatinine Clearance: 49.5 mL/min (by C-G formula based on SCr of 0.73 mg/dL). Liver Function Tests: Recent Labs  Lab 12/24/22 1701  AST 13*  ALT 11  ALKPHOS 70  BILITOT 0.7  PROT 7.0  ALBUMIN 2.9*   No results for input(s): "LIPASE", "AMYLASE" in the last 168 hours. No results for input(s): "AMMONIA" in the last 168 hours. Coagulation Profile: No results for input(s): "INR", "PROTIME" in the last 168 hours. Cardiac Enzymes: No results for input(s): "CKTOTAL", "CKMB", "CKMBINDEX", "TROPONINI" in the last 168 hours. BNP (last 3 results) No results for input(s): "PROBNP" in the last 8760 hours. HbA1C: No results for input(s): "HGBA1C" in the last 72 hours. CBG: No results for input(s): "GLUCAP" in the last 168 hours. Lipid Profile: No results for input(s): "CHOL", "HDL",  "LDLCALC", "TRIG", "CHOLHDL", "LDLDIRECT" in the last 72 hours. Thyroid Function Tests: No results for input(s): "TSH", "T4TOTAL", "FREET4", "T3FREE", "THYROIDAB" in the last 72 hours. Anemia Panel: No results for input(s): "VITAMINB12", "FOLATE", "FERRITIN", "TIBC", "IRON", "RETICCTPCT" in the last 72 hours.  Sepsis Labs: No results for input(s): "PROCALCITON", "LATICACIDVEN" in the last 168 hours.  Recent Results (from the past 240 hour(s))  MRSA Next Gen by PCR, Nasal     Status: None   Collection Time: 12/24/22 11:16 PM   Specimen: Nasal Mucosa; Nasal Swab  Result Value Ref Range Status   MRSA by  PCR Next Gen NOT DETECTED NOT DETECTED Final    Comment: (NOTE) The GeneXpert MRSA Assay (FDA approved for NASAL specimens only), is one component of a comprehensive MRSA colonization surveillance program. It is not intended to diagnose MRSA infection nor to guide or monitor treatment for MRSA infections. Test performance is not FDA approved in patients less than 93 years old. Performed at [facility], [address] [place], [place] [address]       Radiology Studies: No results found.    Scheduled Meds:  amiodarone  400 mg Oral BID   apixaban  5 mg Oral BID   budesonide (PULMICORT) nebulizer solution  0.5 mg Nebulization BID   docusate sodium  100 mg Oral Daily   ferrous sulfate  325 mg Oral Q breakfast   fluticasone  2 spray Each Nare Daily   levalbuterol  0.63 mg Nebulization BID   metoprolol tartrate  25 mg Oral BID   montelukast  10 mg Oral QPM   pantoprazole  40 mg Oral Daily   pregabalin  50 mg Oral BID   rosuvastatin  5 mg Oral QODAY   Continuous Infusions:     LOS: 6 days   Time spent: 25 minutes   Noralee Stain, DO [facility] 12/30/2022, 12:17 PM   Available via Epic secure chat 7am-7pm After these hours, please refer to coverage provider listed on [email or web address]

## 2022-12-30 NOTE — Evaluation (Signed)
Occupational Therapy Evaluation Patient Details Name: Dana Horton Demello MRN: [id number] DOB: 18-Sep-1945 Today's Date: 12/30/2022   History of Present Illness Mrs. Danielsen is a 77 yr old female admitted to the hospital with palpitations and weakness. She was found to have a fib with RVR, acute hypoxemic respiratory failure, and acute on chronic diastolic CHF. PMH: CKD 3, COPD, CHF, HTN, CAD, bladder CA   Clinical Impression   The pt appears to be very near to her baseline level of functioning for self-care management, as she performed all assessed tasks with modified independence or better. Her O2 saturation was noted to be >90% on room air with progressive activity, including ambulating in the hall without an assistive device. She is not presenting with acute functional deficits that warrant the need for further OT services. OT will sign off and recommend she return home at discharge.           Functional Status Assessment  Patient has not had a recent decline in their functional status  Equipment Recommendations  None recommended by OT    Recommendations for Other Services       Precautions / Restrictions Restrictions Weight Bearing Restrictions: No      Mobility Bed Mobility    General bed mobility comments: Pt was received seated in the bedside chair    Transfers Overall transfer level: Independent Equipment used: None        Balance Overall balance assessment: No apparent balance deficits (not formally assessed)         ADL either performed or assessed with clinical judgement   ADL Overall ADL's : Independent;At baseline            Pertinent Vitals/Pain Pain Assessment Pain Assessment: No/denies pain     Extremity/Trunk Assessment Upper Extremity Assessment Upper Extremity Assessment: Overall WFL for tasks assessed;Right hand dominant   Lower Extremity Assessment Lower Extremity Assessment: Overall WFL for tasks assessed        Communication Communication Communication: No apparent difficulties   Cognition Arousal: Alert Behavior During Therapy: WFL for tasks assessed/performed Overall Cognitive Status: Within Functional Limits for tasks assessed        General Comments: Oriented x4, friendly, able to follow commands without difficulty                Home Living Family/patient expects to be discharged to:: Private residence Living Arrangements: Spouse/significant other Available Help at Discharge: Family Type of Home: House Home Access: Level entry     Home Layout: One level     Bathroom Shower/Tub: Runner, broadcasting/film/video: Shower seat          Prior Functioning/Environment Prior Level of Function : Independent/Modified Independent;Driving             Mobility Comments: She was independent with ambulation. ADLs Comments: She was independent with ADLs, driving, and cooking. She had hired assist for cleaning.              OT Treatment/Interventions:   No further treatment needs identified      OT Frequency:  N/A    Co-evaluation PT/OT/SLP Co-Evaluation/Treatment: Yes Reason for Co-Treatment: To address functional/ADL transfers PT goals addressed during session: Mobility/safety with mobility OT goals addressed during session: ADL's and self-care      AM-PAC OT "6 Clicks" Daily Activity     Outcome Measure Help from another person eating meals?: None Help from another person taking care of personal grooming?: None  Help from another person toileting, which includes using toliet, bedpan, or urinal?: None Help from another person bathing (including washing, rinsing, drying)?: None Help from another person to put on and taking off regular upper body clothing?: None Help from another person to put on and taking off regular lower body clothing?: None 6 Click Score: 24   End of Session Equipment Utilized During Treatment: Gait belt Nurse Communication:  Mobility status  Activity Tolerance: Patient tolerated treatment well Patient left: in chair;with call bell/phone within reach  OT Visit Diagnosis: Muscle weakness (generalized) (M62.81)                Time: 1130-1145 OT Time Calculation (min): 15 min Charges:  OT General Charges $OT Visit: 1 Visit OT Evaluation $OT Eval Low Complexity: 1 Low    Jairus Tonne L Corie Vavra, OTR/L 12/30/2022, 2:02 PM

## 2022-12-30 NOTE — Progress Notes (Signed)
   12/30/22 2250  BiPAP/CPAP/SIPAP  BiPAP/CPAP/SIPAP Pt Type Adult  BiPAP/CPAP/SIPAP Resmed  Mask Type Full face mask  Mask Size Medium  Respiratory Rate 18 breaths/min  Patient Home Equipment Yes  Auto Titrate No

## 2022-12-30 NOTE — Progress Notes (Signed)
Progress Note  Patient Name: Dana Horton Date of Encounter: 12/30/2022  Primary Cardiologist:   Norman Herrlich, MD   Subjective  Breathing better. No pain  Inpatient Medications    Scheduled Meds:  amiodarone  400 mg Oral BID   apixaban  5 mg Oral BID   budesonide (PULMICORT) nebulizer solution  0.5 mg Nebulization BID   docusate sodium  100 mg Oral Daily   ferrous sulfate  325 mg Oral Q breakfast   fluticasone  2 spray Each Nare Daily   levalbuterol  0.63 mg Nebulization BID   metoprolol tartrate  25 mg Oral BID   montelukast  10 mg Oral QPM   pantoprazole  40 mg Oral Daily   pregabalin  50 mg Oral BID   rosuvastatin  5 mg Oral QODAY   Continuous Infusions:   PRN Meds: acetaminophen **OR** acetaminophen, magnesium hydroxide, ondansetron **OR** ondansetron (ZOFRAN) IV, mouth rinse, traZODone   Vital Signs    Vitals:   12/30/22 0207 12/30/22 0228 12/30/22 0642 12/30/22 0757  BP: (!) 98/44 (!) 102/41 (!) 95/38   Pulse: 62  63   Resp:   20   Temp: (!) 97.5 F (36.4 C)  97.6 F (36.4 C)   TempSrc: Axillary  Oral   SpO2: 96%  95% 96%  Weight:      Height:        Intake/Output Summary (Last 24 hours) at 12/30/2022 0835 Last data filed at 12/29/2022 1717 Gross per 24 hour  Intake 183 ml  Output --  Net 183 ml   Filed Weights   12/24/22 1647 12/25/22 0500 12/29/22 0500  Weight: 58.5 kg 59 kg 59.2 kg    Telemetry   NSR:   Personally Reviewed  ECG    N/A- Personally Reviewed  Physical Exam   GEN: No  acute distress.   Neck: No  JVD Cardiac: RRR, 3/6 apical systolic murmur holosystolic, no diastolic murmurs, rubs, or gallops.  Respiratory: Clear   to auscultation bilaterally. GI: Soft, nontender, non-distended, normal bowel sounds  MS:  No edema; No deformity. Neuro:   Nonfocal  Psych: Oriented and appropriate   Labs    Chemistry Recent Labs  Lab 12/24/22 1701 12/25/22 0308 12/28/22 0306 12/29/22 0339 12/30/22 0521  NA  139   < > 137 134* 137  K 3.5   < > 3.8 3.6 3.8  CL 105   < > 103 102 103  CO2 25   < > 24 24 25   GLUCOSE 98   < > 122* 103* 100*  BUN 17   < > 20 21 23   CREATININE 0.87   < > 0.80 0.67 0.73  CALCIUM 8.8*   < > 8.5* 8.7* 8.9  PROT 7.0  --   --   --   --   ALBUMIN 2.9*  --   --   --   --   AST 13*  --   --   --   --   ALT 11  --   --   --   --   ALKPHOS 70  --   --   --   --   BILITOT 0.7  --   --   --   --   GFRNONAA >60   < > >60 >60 >60  ANIONGAP 9   < > 10 8 9    < > = values in this interval not displayed.     Hematology Recent Labs  Lab 12/28/22 0306 12/29/22 0339 12/30/22 0521  WBC 8.4 7.9 7.8  RBC 2.82* 2.82* 2.76*  HGB 8.3* 8.3* 8.2*  HCT 27.2* 26.9* 26.5*  MCV 96.5 95.4 96.0  MCH 29.4 29.4 29.7  MCHC 30.5 30.9 30.9  RDW 19.1* 19.2* 19.3*  PLT 151 151 151    Cardiac EnzymesNo results for input(s): "TROPONINI" in the last 168 hours. No results for input(s): "TROPIPOC" in the last 168 hours.   BNP Recent Labs  Lab 12/24/22 1701  BNP 1,180.3*     DDimer No results for input(s): "DDIMER" in the last 168 hours.   Radiology    No results found.  Cardiac Studies   Echo:  1. Left ventricular ejection fraction, by estimation, is 65 to 70%. The  left ventricle has normal function. The left ventricle has no regional  wall motion abnormalities. There is moderate asymmetric left ventricular  hypertrophy of the basal-septal  segment. Left ventricular diastolic parameters are consistent with Grade  III diastolic dysfunction (restrictive).   2. Right ventricular systolic function is normal. The right ventricular  size is normal. There is moderately elevated pulmonary artery systolic  pressure. The estimated right ventricular systolic pressure is 59.3 mmHg.   3. Left atrial size was severely dilated.   4. Right atrial size was moderately dilated.   5. The mitral valve is thickened with mildly restricted leaflet motion.  The left atrium is severely dialted with  dialtion of the MV annulus. There  is severe central MR. The mitral valve is abnormal. Severe mitral valve  regurgitation. No evidence of  mitral stenosis.   6. Tricuspid valve regurgitation is moderate.   7. The aortic valve is tricuspid. Aortic valve regurgitation is mild to  moderate. No aortic stenosis is present. Aortic valve area, by VTI  measures 2.45 cm. Aortic valve mean gradient measures 9.0 mmHg. Aortic  valve Vmax measures 2.03 m/s.   8. The inferior vena cava is dilated in size with <50% respiratory  variability, suggesting right atrial pressure of 15 mmHg.    Patient Profile     77 y.o. female  with a hx of atrial fibrillation, HFpEF, HTN, tobacco use, COPD, CKD stage IIIa, GERD, bladder cancer, HLD who is being seen 12/25/2022 for the evaluation of atrial fibrillation, CHF at the request of Dr. Isidoro Donning.   Assessment & Plan    Paroxysmal Atrial Fibrillation :  PAF.  Maintaining NSR.   Continue Eliquis.  Changed to PO amio yesterday.   I would suggest amio 200 mg bid at discharge x 2 weeks and then 200 mg PO daily.    Acute Respiratory Failure with hypoxemia :    Continue therapy per primary team.    Acute on Chronic Diastolic Heart Failure :  Echo results as above.  Off Lasix currently and she will likely need PRN Lasix at discharge.   Severe MR:    Medical management.  She will follow with Dr. Dulce Sellar as an out patient.  I discussed this with her and she can follow up.   When she is better I would suggest TEE to assess whether this could be fixed percutaneously.    HTN :   Tolerating current dose metoprolol.     For questions or updates, please contact [facility] Please consult [email or web address] for contact info under Cardiology/STEMI.   Signed, Rollene Rotunda, MD  12/30/2022, 8:35 AM

## 2022-12-30 NOTE — Evaluation (Signed)
Physical Therapy Evaluation Patient Details Name: Dana Horton MRN: [id number] DOB: [birth year] Today's Date: 12/30/2022  History of Present Illness  [name] is a 77 yr old female admitted to the hospital with palpitations and weakness. She was found to have a fib with RVR, acute hypoxemic respiratory failure, and acute on chronic diastolic CHF. PMH: CKD 3, COPD, CHF, HTN, CAD, bladder CA  Clinical Impression  Pt admitted as above and presets with functional mobility limitations 2* mild generalized weakness and decreased endurance from ~one week in bed.  This date, pt appears to be very near to her baseline level of functioning for mobility stating "I just usually move faster than this". Her O2 saturation was noted to be >90% on room air with progressive activity, including ambulating in the hall without an assistive device. She is not presenting with acute functional deficits that warrant the need for further PT services. PT will sign off and recommend follow up by mobility team for duration of hospital stay.        If plan is discharge home, recommend the following:     Can travel by private vehicle        Equipment Recommendations None recommended by PT  Recommendations for Other Services       Functional Status Assessment Patient has not had a recent decline in their functional status     Precautions / Restrictions Restrictions Weight Bearing Restrictions: No      Mobility  Bed Mobility               General bed mobility comments: Pt was received seated in the bedside chair and requested back to same    Transfers Overall transfer level: Independent Equipment used: None                    Ambulation/Gait Ambulation/Gait assistance: Independent Gait Distance (Feet): 450 Feet Assistive device: None Gait Pattern/deviations: WFL(Within Functional Limits) Gait velocity: mod pace     General Gait Details: Pt stepping fwd, bkwd  and sideways with no LOB and good safety awareness.  Stairs            Wheelchair Mobility     Tilt Bed    Modified Rankin (Stroke Patients Only)       Balance Overall balance assessment: No apparent balance deficits (not formally assessed)                                           Pertinent Vitals/Pain Pain Assessment Pain Assessment: No/denies pain    Home Living Family/patient expects to be discharged to:: Private residence Living Arrangements: Spouse/significant other Available Help at Discharge: Family Type of Home: House Home Access: Level entry       Home Layout: One level Home Equipment: Shower seat      Prior Function Prior Level of Function : Independent/Modified Independent;Driving             Mobility Comments: She was independent with ambulation. ADLs Comments: She was independent with ADLs, driving, and cooking. She had hired assist for cleaning.     Extremity/Trunk Assessment   Upper Extremity Assessment Upper Extremity Assessment: Overall WFL for tasks assessed    Lower Extremity Assessment Lower Extremity Assessment: Overall WFL for tasks assessed       Communication   Communication Communication: No apparent difficulties  Cognition Arousal: Alert Behavior During  Therapy: WFL for tasks assessed/performed Overall Cognitive Status: Within Functional Limits for tasks assessed                                 General Comments: Oriented x4, friendly, able to follow commands without difficulty        General Comments      Exercises     Assessment/Plan    PT Assessment Patient does not need any further PT services  PT Problem List         PT Treatment Interventions      PT Goals (Current goals can be found in the Care Plan section)  Acute Rehab PT Goals Patient Stated Goal: HOME PT Goal Formulation: All assessment and education complete, DC therapy    Frequency        Co-evaluation PT/OT/SLP Co-Evaluation/Treatment: Yes Reason for Co-Treatment: To address functional/ADL transfers PT goals addressed during session: Mobility/safety with mobility OT goals addressed during session: ADL's and self-care       AM-PAC PT "6 Clicks" Mobility  Outcome Measure Help needed turning from your back to your side while in a flat bed without using bedrails?: None Help needed moving from lying on your back to sitting on the side of a flat bed without using bedrails?: None Help needed moving to and from a bed to a chair (including a wheelchair)?: None Help needed standing up from a chair using your arms (e.g., wheelchair or bedside chair)?: None Help needed to walk in hospital room?: None Help needed climbing 3-5 steps with a railing? : None 6 Click Score: 24    End of Session Equipment Utilized During Treatment: Gait belt Activity Tolerance: Patient tolerated treatment well;No increased pain Patient left: in chair;with call bell/phone within reach;with chair alarm set Nurse Communication: Mobility status PT Visit Diagnosis: Difficulty in walking, not elsewhere classified (R26.2)    Time: 5956-3875 PT Time Calculation (min) (ACUTE ONLY): 22 min   Charges:   PT Evaluation $PT Eval Low Complexity: 1 Low   PT General Charges $$ ACUTE PT VISIT: 1 Visit         Mauro Kaufmann PT Acute Rehabilitation Services Pager [phone] Office [phone]   Duanne Duchesne 12/30/2022, 3:17 PM

## 2022-12-31 DIAGNOSIS — J9601 Acute respiratory failure with hypoxia: Secondary | ICD-10-CM | POA: Diagnosis not present

## 2022-12-31 NOTE — Progress Notes (Signed)
PROGRESS NOTE    Dana Horton  ZOX:[id number] DOB: 05-Jan-1946 DOA: 12/24/2022 PCP: Crist Fat, MD     Brief Narrative:  Dana Horton is a 77 year old female with past medical history significant for A-fib, CKD, COPD, diastolic heart failure, OSA on CPAP, hypertension who presented with acute onset of palpitation, generalized weakness.  She was found to have low blood pressure in the 80s.  She was scheduled to have iron infusion but was noted to be in A-fib RVR with heart rate 1 40-1 60s, symptomatic with shortness of breath.  She was then sent to the emergency department.  In the ED, she was in A-fib RVR with heart rate up to 167, BP 96/63, noted to be in respiratory distress.  She was subsequently placed on BiPAP and admitted to stepdown unit.  Pulmonology as well as cardiology consulted.  Patient required amiodarone drip.  Patient converted to normal sinus rhythm.  New events last 24 hours / Subjective: Patient doing well, did great with physical therapy yesterday.  In normal sinus rhythm this morning.  Cardiology planning on doing TEE 10/8.  Assessment & Plan:   Principal Problem:   Acute respiratory failure with hypoxia (HCC) Active Problems:   Essential hypertension   OSA on CPAP   Paroxysmal atrial fibrillation with RVR (HCC)   Acute on chronic anemia   Chronic obstructive pulmonary disease (HCC)   Stage 3a chronic kidney disease (HCC)   GERD without esophagitis   Peripheral neuropathy   Acute on chronic diastolic CHF (congestive heart failure) (HCC)   Acute hypoxemic respiratory failure -Resolved, off BiPAP and currently on room air  Paroxysmal A-fib with RVR -Reportedly had not taken metoprolol, Eliquis, flecainide on 9/29 or 9/30 due to concern about low BP -Flecainide, metoprolol --> PO amiodarone, Lopressor -Eliquis   Acute on chronic diastolic CHF -Echo showed EF of 65 to 70%, moderate asymmetric left ventricular hypertrophy of  the basal septal segment, G3 DD, normal right ventricular systolic function, left atrium severely dilated, severe MR, moderate aortic valve regurgitation.  -Stable  -Planning on TEE 10/8 to evaluate MR  Acute on chronic normocytic anemia -Baseline hemoglobin 8-9.  Anemia panel in 08/2022 had shown low iron and % sat.  Previous in 2018 EGD normal and colonoscopy had shown diverticulosis and polyps.  -Eliquis, continue to monitor for any signs of bleeding -FOBT negative  -Ferrous sulfate -Hemoglobin stable  Hyperlipidemia -Crestor  GERD -Protonix  OSA -CPAP nightly  History of bladder cancer, lung nodules -Refer to oncology outpatient   DVT prophylaxis:  apixaban (ELIQUIS) tablet 5 mg  Code Status: DNR Family Communication: No family at bedside, updated daughter over the phone today Disposition Plan: Home Status is: Inpatient Remains inpatient appropriate because: TEE 10/8   Antimicrobials:  Anti-infectives (From admission, onward)    None        Objective: Vitals:   12/30/22 1945 12/31/22 0500 12/31/22 0500 12/31/22 1015  BP: (!) 113/49  (!) 110/46 (!) 99/42  Pulse: 74  (!) 104 67  Resp: 18  17   Temp: 98.4 F (36.9 C)  97.9 F (36.6 C)   TempSrc: Oral  Oral   SpO2: 91%  97%   Weight:  61.5 kg    Height:        Intake/Output Summary (Last 24 hours) at 12/31/2022 1111 Last data filed at 12/31/2022 0819 Gross per 24 hour  Intake 360 ml  Output 1650 ml  Net -1290 ml   [employer]  12/29/22 0500 12/30/22 0713 12/31/22 0500  Weight: 59.2 kg 59.2 kg 61.5 kg    Examination:  General exam: Appears calm and comfortable  Respiratory system: Clear to auscultation. Respiratory effort normal. No respiratory distress. No conversational dyspnea.  On room air Cardiovascular system: S1 & S2 heard, RR, no edema Gastrointestinal system: Abdomen is nondistended, soft and nontender. Normal bowel sounds heard. Central nervous system: Alert and oriented. No focal  neurological deficits. Speech clear.  Extremities: Symmetric in appearance  Skin: No rashes, lesions or ulcers on exposed skin  Psychiatry: Judgement and insight appear normal. Mood & affect appropriate.   Data Reviewed: I have personally reviewed following labs and imaging studies  CBC: Recent Labs  Lab 12/24/22 1701 12/25/22 0308 12/26/22 0309 12/27/22 0258 12/28/22 0306 12/29/22 0339 12/30/22 0521  WBC 7.9   < > 7.0 6.7 8.4 7.9 7.8  NEUTROABS 5.8  --   --   --   --   --   --   HGB 8.4*   < > 8.1* 8.7* 8.3* 8.3* 8.2*  HCT 27.8*   < > 26.3* 28.8* 27.2* 26.9* 26.5*  MCV 96.5   < > 94.6 98.0 96.5 95.4 96.0  PLT 157   < > 140* 146* 151 151 151   < > = values in this interval not displayed.   Basic Metabolic Panel: Recent Labs  Lab 12/24/22 1701 12/25/22 0308 12/26/22 0309 12/27/22 0258 12/28/22 0306 12/29/22 0339 12/30/22 0521  NA 139   < > 137 139 137 134* 137  K 3.5   < > 3.6 3.7 3.8 3.6 3.8  CL 105   < > 105 104 103 102 103  CO2 25   < > 22 26 24 24 25   GLUCOSE 98   < > 105* 110* 122* 103* 100*  BUN 17   < > 19 17 20 21 23   CREATININE 0.87   < > 0.73 0.78 0.80 0.67 0.73  CALCIUM 8.8*   < > 8.3* 8.6* 8.5* 8.7* 8.9  MG 1.9  --   --   --   --   --   --    < > = values in this interval not displayed.   GFR: Estimated Creatinine Clearance: 49.5 mL/min (by C-G formula based on SCr of 0.73 mg/dL). Liver Function Tests: Recent Labs  Lab 12/24/22 1701  AST 13*  ALT 11  ALKPHOS 70  BILITOT 0.7  PROT 7.0  ALBUMIN 2.9*   No results for input(s): "LIPASE", "AMYLASE" in the last 168 hours. No results for input(s): "AMMONIA" in the last 168 hours. Coagulation Profile: No results for input(s): "INR", "PROTIME" in the last 168 hours. Cardiac Enzymes: No results for input(s): "CKTOTAL", "CKMB", "CKMBINDEX", "TROPONINI" in the last 168 hours. BNP (last 3 results) No results for input(s): "PROBNP" in the last 8760 hours. HbA1C: No results for input(s): "HGBA1C" in the  last 72 hours. CBG: No results for input(s): "GLUCAP" in the last 168 hours. Lipid Profile: No results for input(s): "CHOL", "HDL", "LDLCALC", "TRIG", "CHOLHDL", "LDLDIRECT" in the last 72 hours. Thyroid Function Tests: No results for input(s): "TSH", "T4TOTAL", "FREET4", "T3FREE", "THYROIDAB" in the last 72 hours. Anemia Panel: No results for input(s): "VITAMINB12", "FOLATE", "FERRITIN", "TIBC", "IRON", "RETICCTPCT" in the last 72 hours.  Sepsis Labs: No results for input(s): "PROCALCITON", "LATICACIDVEN" in the last 168 hours.  Recent Results (from the past 240 hour(s))  MRSA Next Gen by PCR, Nasal     Status: None  Collection Time: 12/24/22 11:16 PM   Specimen: Nasal Mucosa; Nasal Swab  Result Value Ref Range Status   MRSA by PCR Next Gen NOT DETECTED NOT DETECTED Final    Comment: (NOTE) The GeneXpert MRSA Assay (FDA approved for NASAL specimens only), is one component of a comprehensive MRSA colonization surveillance program. It is not intended to diagnose MRSA infection nor to guide or monitor treatment for MRSA infections. Test performance is not FDA approved in patients less than 49 years old. Performed at [facility], [address], [place], [place] [address]       Radiology Studies: No results found.    Scheduled Meds:  amiodarone  400 mg Oral BID   apixaban  5 mg Oral BID   budesonide (PULMICORT) nebulizer solution  0.5 mg Nebulization BID   docusate sodium  100 mg Oral Daily   ferrous sulfate  325 mg Oral Q breakfast   fluticasone  2 spray Each Nare Daily   levalbuterol  0.63 mg Nebulization BID   metoprolol tartrate  25 mg Oral BID   montelukast  10 mg Oral QPM   pantoprazole  40 mg Oral Daily   pregabalin  50 mg Oral BID   rosuvastatin  5 mg Oral QODAY   Continuous Infusions:     LOS: 7 days   Time spent: 25 minutes   Noralee Stain, DO [facility] 12/31/2022, 11:11 AM   Available via Epic secure chat  7am-7pm After these hours, please refer to coverage provider listed on [email or web address]

## 2022-12-31 NOTE — Progress Notes (Signed)
Progress Note  Patient Name: Dana Horton Date of Encounter: 12/31/2022 Primary Cardiologist: Norman Herrlich, MD   Subjective   Overnight she had one episode of tachypnea noted and was briefly on pressures support. Yesterday with OT her O2 saturation was noted to be >90% on room air with progressive activity, including ambulating in the hall without an assistive device. PT and OT signed off.  She notes she takes care of her husband She notes cough and sinus congestion; feels much better.  Vital Signs    Vitals:   12/30/22 1700 12/30/22 1945 12/31/22 0500 12/31/22 0500  BP: (!) 106/47 (!) 113/49  (!) 110/46  Pulse: 73 74  (!) 104  Resp:  18  17  Temp: 98.2 F (36.8 C) 98.4 F (36.9 C)  97.9 F (36.6 C)  TempSrc: Oral Oral  Oral  SpO2: 94% 91%  97%  Weight:   61.5 kg   Height:        Intake/Output Summary (Last 24 hours) at 12/31/2022 0926 Last data filed at 12/31/2022 0819 Gross per 24 hour  Intake 360 ml  Output 1650 ml  Net -1290 ml   Filed Weights   12/29/22 0500 12/30/22 0713 12/31/22 0500  Weight: 59.2 kg 59.2 kg 61.5 kg    Physical Exam   GEN: No acute distress.   Neck: No JVD Cardiac: RRR, systolic murmur > diastolic murmur Respiratory: Clear to auscultation bilaterally. GI: Soft, nontender, non-distended  MS: No edema  Labs     Chemistry Recent Labs  Lab 12/24/22 1701 12/25/22 0308 12/28/22 0306 12/29/22 0339 12/30/22 0521  NA 139   < > 137 134* 137  K 3.5   < > 3.8 3.6 3.8  CL 105   < > 103 102 103  CO2 25   < > 24 24 25   GLUCOSE 98   < > 122* 103* 100*  BUN 17   < > 20 21 23   CREATININE 0.87   < > 0.80 0.67 0.73  CALCIUM 8.8*   < > 8.5* 8.7* 8.9  PROT 7.0  --   --   --   --   ALBUMIN 2.9*  --   --   --   --   AST 13*  --   --   --   --   ALT 11  --   --   --   --   ALKPHOS 70  --   --   --   --   BILITOT 0.7  --   --   --   --   GFRNONAA >60   < > >60 >60 >60  ANIONGAP 9   < > 10 8 9    < > = values in this interval  not displayed.     Hematology Recent Labs  Lab 12/28/22 0306 12/29/22 0339 12/30/22 0521  WBC 8.4 7.9 7.8  RBC 2.82* 2.82* 2.76*  HGB 8.3* 8.3* 8.2*  HCT 27.2* 26.9* 26.5*  MCV 96.5 95.4 96.0  MCH 29.4 29.4 29.7  MCHC 30.5 30.9 30.9  RDW 19.1* 19.2* 19.3*  PLT 151 151 151    Cardiac EnzymesNo results for input(s): "TROPONINI" in the last 168 hours. No results for input(s): "TROPIPOC" in the last 168 hours.   BNP Recent Labs  Lab 12/24/22 1701  BNP 1,180.3*     DDimer No results for input(s): "DDIMER" in the last 168 hours.   Cardiac Studies   Cardiac Studies & Procedures  CARDIAC CATHETERIZATION  CARDIAC CATHETERIZATION 04/09/2017  Narrative  The left ventricular systolic function is normal.  LV end diastolic pressure is normal. LVEDP 16 mm Hg.  The left ventricular ejection fraction is greater than 65% by visual estimate.  There is no aortic valve stenosis.  No angiographically apparent CAD.  Continue medical therapy.  Restart heparin gtt 8 hours after sheath pull for AFib/stroke prevention.  Findings Coronary Findings Diagnostic  Dominance: Right  Left Main Vessel was injected. Vessel is normal in caliber. Vessel is angiographically normal.  Left Anterior Descending Vessel was injected. Vessel is normal in caliber. Vessel is angiographically normal.  Left Circumflex Vessel was injected. Vessel is normal in caliber. Vessel is angiographically normal.  Right Coronary Artery Vessel was injected. Vessel is normal in caliber. Vessel is angiographically normal.  Intervention  No interventions have been documented.   CARDIAC CATHETERIZATION  CARDIAC CATHETERIZATION 10/17/2015     ECHOCARDIOGRAM  ECHOCARDIOGRAM COMPLETE 12/25/2022  Narrative ECHOCARDIOGRAM REPORT    Patient Name:   Dana Horton Date of Exam: 12/25/2022 Medical Rec #:  [id number]                     Height:       63.0 in Accession #:    [id number]                     Weight:       130.1 lb Date of Birth:  05/10/1945                    BSA:          1.611 m Patient Age:    77 years                      BP:           102/33 mmHg Patient Gender: F                             HR:           72 bpm. Exam Location:  Inpatient  Procedure: 2D Echo, Cardiac Doppler and Color Doppler  Indications:    CHF  History:        Patient has no prior history of Echocardiogram examinations. CHF, COPD, Arrythmias:Atrial Fibrillation; Risk Factors:Hypertension and Dyslipidemia.  Sonographer:    Melton Krebs RDCS, FE, PE Referring Phys: [id number] JAN A MANSY  IMPRESSIONS   1. Left ventricular ejection fraction, by estimation, is 65 to 70%. The left ventricle has normal function. The left ventricle has no regional wall motion abnormalities. There is moderate asymmetric left ventricular hypertrophy of the basal-septal segment. Left ventricular diastolic parameters are consistent with Grade III diastolic dysfunction (restrictive). 2. Right ventricular systolic function is normal. The right ventricular size is normal. There is moderately elevated pulmonary artery systolic pressure. The estimated right ventricular systolic pressure is 59.3 mmHg. 3. Left atrial size was severely dilated. 4. Right atrial size was moderately dilated. 5. The mitral valve is thickened with mildly restricted leaflet motion. The left atrium is severely dialted with dialtion of the MV annulus. There is severe central MR. The mitral valve is abnormal. Severe mitral valve regurgitation. No evidence of mitral stenosis. 6. Tricuspid valve regurgitation is moderate. 7. The aortic valve is tricuspid. Aortic valve regurgitation is mild to moderate. No aortic stenosis is present. Aortic valve area, by VTI measures  2.45 cm. Aortic valve mean gradient measures 9.0 mmHg. Aortic valve Vmax measures 2.03 m/s. 8. The inferior vena cava is dilated in size with <50% respiratory variability, suggesting  right atrial pressure of 15 mmHg.  Conclusion(s)/Recommendation(s): Consider TEE to further evalaute valvular disease if clinically indicated.  FINDINGS Left Ventricle: Left ventricular ejection fraction, by estimation, is 65 to 70%. The left ventricle has normal function. The left ventricle has no regional wall motion abnormalities. The left ventricular internal cavity size was normal in size. There is moderate asymmetric left ventricular hypertrophy of the basal-septal segment. Left ventricular diastolic parameters are consistent with Grade III diastolic dysfunction (restrictive).  Right Ventricle: The right ventricular size is normal. No increase in right ventricular wall thickness. Right ventricular systolic function is normal. There is moderately elevated pulmonary artery systolic pressure. The tricuspid regurgitant velocity is 3.51 m/s, and with an assumed right atrial pressure of 10 mmHg, the estimated right ventricular systolic pressure is 59.3 mmHg.  Left Atrium: Left atrial size was severely dilated.  Right Atrium: Right atrial size was moderately dilated.  Pericardium: There is no evidence of pericardial effusion.  Mitral Valve: The mitral valve is thickened with mildly restricted leaflet motion. The left atrium is severely dialted with dialtion of the MV annulus. There is severe central MR. The mitral valve is abnormal. There is mild thickening of the mitral valve leaflet(s). Mild to moderate mitral annular calcification. Severe mitral valve regurgitation. No evidence of mitral valve stenosis.  Tricuspid Valve: The tricuspid valve is normal in structure. Tricuspid valve regurgitation is moderate . No evidence of tricuspid stenosis.  Aortic Valve: The aortic valve is tricuspid. Aortic valve regurgitation is mild to moderate. Aortic regurgitation PHT measures 270 msec. No aortic stenosis is present. Aortic valve mean gradient measures 9.0 mmHg. Aortic valve peak gradient measures  16.5 mmHg. Aortic valve area, by VTI measures 2.45 cm.  Pulmonic Valve: The pulmonic valve was normal in structure. Pulmonic valve regurgitation is not visualized. No evidence of pulmonic stenosis.  Aorta: The aortic root is normal in size and structure.  Venous: The inferior vena cava is dilated in size with less than 50% respiratory variability, suggesting right atrial pressure of 15 mmHg.  IAS/Shunts: No atrial level shunt detected by color flow Doppler.   LEFT VENTRICLE PLAX 2D LVIDd:         4.00 cm   Diastology LVIDs:         2.50 cm   LV e' medial:    5.77 cm/s LV PW:         1.10 cm   LV E/e' medial:  29.8 LV IVS:        1.50 cm   LV e' lateral:   6.42 cm/s LVOT diam:     1.90 cm   LV E/e' lateral: 26.8 LV SV:         104 LV SV Index:   65 LVOT Area:     2.84 cm   RIGHT VENTRICLE RV S prime:     12.90 cm/s TAPSE (M-mode): 1.7 cm  LEFT ATRIUM             Index        RIGHT ATRIUM           Index LA diam:        4.30 cm 2.67 cm/m   RA Area:     20.40 cm LA Vol (A2C):   96.4 ml 59.86 ml/m  RA Volume:   56.70 ml  35.21 ml/m LA Vol (A4C):   85.3 ml 52.96 ml/m LA Biplane Vol: 91.9 ml 57.06 ml/m AORTIC VALVE AV Area (Vmax):    2.54 cm AV Area (Vmean):   2.30 cm AV Area (VTI):     2.45 cm AV Vmax:           203.00 cm/s AV Vmean:          143.000 cm/s AV VTI:            0.426 m AV Peak Grad:      16.5 mmHg AV Mean Grad:      9.0 mmHg LVOT Vmax:         182.00 cm/s LVOT Vmean:        116.000 cm/s LVOT VTI:          0.368 m LVOT/AV VTI ratio: 0.86 AI PHT:            270 msec  AORTA Ao Root diam: 2.60 cm  MITRAL VALVE                TRICUSPID VALVE MV Area (PHT): 2.79 cm     TR Peak grad:   49.3 mmHg MV Decel Time: 272 msec     TR Vmax:        351.00 cm/s MV E velocity: 172.00 cm/s MV A velocity: 49.90 cm/s   SHUNTS MV E/A ratio:  3.45         Systemic VTI:  0.37 m Systemic Diam: 1.90 cm  Arvilla Meres MD Electronically signed by Arvilla Meres  MD Signature Date/Time: 12/25/2022/10:16:24 AM    Final    MONITORS  LONG TERM MONITOR-LIVE TELEMETRY (3-14 DAYS) 08/28/2022  Narrative Patch Wear Time:  12 days and 19 hours (2024-05-01T15:53:56-0400 to 2024-05-14T11:31:55-0400)  Patient had a min HR of 55 bpm, max HR of 182 bpm, and avg HR of 73 bpm. Predominant underlying rhythm was Sinus Rhythm.  There were no triggered or diary events.  There were no pauses of 3 seconds or greater and no episodes of second or third-degree AV nodal block.  10 Supraventricular Tachycardia runs occurred, the run with the fastest interval lasting 8 beats with a max rate of 182 bpm, the longest lasting 11 beats with an avg rate of 162 bpm.  Atrial Fibrillation occurred (<1% burden), ranging from 105-140 bpm (avg of 118 bpm), the longest lasting 1 min 7 secs with an avg rate of 118 bpm.  Isolated SVEs were rare (<1.0%), SVE Couplets were rare (<1.0%), and SVE Triplets were rare (<1.0%).  Isolated VEs were rare (<1.0%), VE Couplets were rare (<1.0%), and no VE Triplets were present. Ventricular Bigeminy was present. MD notification criteria for First Documentation of Atrial Fibrillation  met - notified Richard C. (RN) on 27 Aug 2022 at 1:15 PM CDT (DR).           DIAGNOSTIC Echocardiogram: Thickening of both mitral leaflets with MAC, thickening of the submitral apparatus of the anterior mitral leaflet, severe left atrial enlargement, severe mitral regurgitation, prominent basal septal hypertrophy, moderate to severe tricuspid regurgitation, eccentric aortic regurgitation, moderate aortic regurgitation. Indeterminate diastology with elevated LAP    Assessment & Plan   Mixed and Multiple Valve Disease - Severe mitral regurgitation, moderate tricuspid regurgitation, and moderate aortic regurgitation. Noted basal septal hypertrophy and severe left atrial enlargement. - She will need a maintenance diuretic for euvolemia; I would favor SGLT2i; have not  yet started this as we are considering procedure - Consented for TEE  for etiology of MR (rule out post inflammatory) and severity of disease  [facility] has been requested to perform a transesophageal echocardiogram on Garvin Fila Gurski for mitral valve/aortic valve/tricuspid valve disease.  After careful review of history and examination, the risks and benefits of transesophageal echocardiogram have been explained including risks of esophageal damage, perforation (1:10,000 risk), bleeding, pharyngeal hematoma as well as other potential complications associated with conscious sedation including aspiration, arrhythmia, respiratory failure and death. Alternatives to treatment were discussed, questions were answered. Patient is willing to proceed.    Paroxsymal Atrial Fibrillation - CHASDVASC 4-5 Managed on amiodarone, Eliquis, and metoprolol. -Continue amiodarone 400mg  PO BID for one week, then 200mg  PO daily. -Continue Eliquis 5mg  PO BID. -Continue metoprolol 25mg  PO BID, can be reduced to 12.5mg  PO BID if persistent hypertension.  Heart Failure with Preserved Ejection Fraction (HFpEF) - Likely increased left atrial pressure. Diuretic was held due to hypotension. -Attempt to start baseline diuretic. -Consider starting SGLT2 inhibitor if patient is amenable (post TEE).  COPD and Tobacco Abuse - Weaned to room air only. -Advise to remain tobacco-free.  Hyperlipidemia, Aortic Atherosclerosis, and Peripheral Arterial Disease Evidenced by femoral calcifications. -Continue statin therapy.    For questions or updates, please contact [facility] Please consult [email or web address] for contact info under Cardiology/STEMI.      Riley Lam, MD FASE [facility] Cardiologist [facility]  [address] [place], [address] [place], [place] [address] [phone]  9:26 AM

## 2022-12-31 NOTE — Plan of Care (Signed)
?  Problem: Education: ?Goal: Ability to demonstrate management of disease process will improve ?Outcome: Progressing ?  ?Problem: Cardiac: ?Goal: Ability to achieve and maintain adequate cardiopulmonary perfusion will improve ?Outcome: Progressing ?  ?Problem: Education: ?Goal: Knowledge of General Education information will improve ?Description: Including pain rating scale, medication(s)/side effects and non-pharmacologic comfort measures ?Outcome: Progressing ?  ?Problem: Clinical Measurements: ?Goal: Cardiovascular complication will be avoided ?Outcome: Progressing ?  ?Problem: Activity: ?Goal: Risk for activity intolerance will decrease ?Outcome: Progressing ?  ?

## 2022-12-31 NOTE — Plan of Care (Signed)
  Problem: Education: Goal: Ability to demonstrate management of disease process will improve Outcome: Progressing Goal: Ability to verbalize understanding of medication therapies will improve Outcome: Progressing Goal: Individualized Educational Video(s) Outcome: Progressing   Problem: Cardiac: Goal: Ability to achieve and maintain adequate cardiopulmonary perfusion will improve Outcome: Progressing

## 2022-12-31 NOTE — Progress Notes (Signed)
   12/31/22 2235  BiPAP/CPAP/SIPAP  BiPAP/CPAP/SIPAP Pt Type Adult  BiPAP/CPAP/SIPAP Resmed  EPAP 10 cmH2O  Patient Home Equipment Yes  Auto Titrate No

## 2022-12-31 NOTE — H&P (View-Only) (Signed)
Progress Note  Patient Name: Dana Horton Date of Encounter: 12/31/2022 Primary Cardiologist: Norman Herrlich, MD   Subjective   Overnight she had one episode of tachypnea noted and was briefly on pressures support. Yesterday with OT her O2 saturation was noted to be >90% on room air with progressive activity, including ambulating in the hall without an assistive device. PT and OT signed off.  She notes she takes care of her husband She notes cough and sinus congestion; feels much better.  Vital Signs    Vitals:   12/30/22 1700 12/30/22 1945 12/31/22 0500 12/31/22 0500  BP: (!) 106/47 (!) 113/49  (!) 110/46  Pulse: 73 74  (!) 104  Resp:  18  17  Temp: 98.2 F (36.8 C) 98.4 F (36.9 C)  97.9 F (36.6 C)  TempSrc: Oral Oral  Oral  SpO2: 94% 91%  97%  Weight:   61.5 kg   Height:        Intake/Output Summary (Last 24 hours) at 12/31/2022 0926 Last data filed at 12/31/2022 0819 Gross per 24 hour  Intake 360 ml  Output 1650 ml  Net -1290 ml   Filed Weights   12/29/22 0500 12/30/22 0713 12/31/22 0500  Weight: 59.2 kg 59.2 kg 61.5 kg    Physical Exam   GEN: No acute distress.   Neck: No JVD Cardiac: RRR, systolic murmur > diastolic murmur Respiratory: Clear to auscultation bilaterally. GI: Soft, nontender, non-distended  MS: No edema  Labs     Chemistry Recent Labs  Lab 12/24/22 1701 12/25/22 0308 12/28/22 0306 12/29/22 0339 12/30/22 0521  NA 139   < > 137 134* 137  K 3.5   < > 3.8 3.6 3.8  CL 105   < > 103 102 103  CO2 25   < > 24 24 25   GLUCOSE 98   < > 122* 103* 100*  BUN 17   < > 20 21 23   CREATININE 0.87   < > 0.80 0.67 0.73  CALCIUM 8.8*   < > 8.5* 8.7* 8.9  PROT 7.0  --   --   --   --   ALBUMIN 2.9*  --   --   --   --   AST 13*  --   --   --   --   ALT 11  --   --   --   --   ALKPHOS 70  --   --   --   --   BILITOT 0.7  --   --   --   --   GFRNONAA >60   < > >60 >60 >60  ANIONGAP 9   < > 10 8 9    < > = values in this interval  not displayed.     Hematology Recent Labs  Lab 12/28/22 0306 12/29/22 0339 12/30/22 0521  WBC 8.4 7.9 7.8  RBC 2.82* 2.82* 2.76*  HGB 8.3* 8.3* 8.2*  HCT 27.2* 26.9* 26.5*  MCV 96.5 95.4 96.0  MCH 29.4 29.4 29.7  MCHC 30.5 30.9 30.9  RDW 19.1* 19.2* 19.3*  PLT 151 151 151    Cardiac EnzymesNo results for input(s): "TROPONINI" in the last 168 hours. No results for input(s): "TROPIPOC" in the last 168 hours.   BNP Recent Labs  Lab 12/24/22 1701  BNP 1,180.3*     DDimer No results for input(s): "DDIMER" in the last 168 hours.   Cardiac Studies   Cardiac Studies & Procedures  CARDIAC CATHETERIZATION  CARDIAC CATHETERIZATION 04/09/2017  Narrative  The left ventricular systolic function is normal.  LV end diastolic pressure is normal. LVEDP 16 mm Hg.  The left ventricular ejection fraction is greater than 65% by visual estimate.  There is no aortic valve stenosis.  No angiographically apparent CAD.  Continue medical therapy.  Restart heparin gtt 8 hours after sheath pull for AFib/stroke prevention.  Findings Coronary Findings Diagnostic  Dominance: Right  Left Main Vessel was injected. Vessel is normal in caliber. Vessel is angiographically normal.  Left Anterior Descending Vessel was injected. Vessel is normal in caliber. Vessel is angiographically normal.  Left Circumflex Vessel was injected. Vessel is normal in caliber. Vessel is angiographically normal.  Right Coronary Artery Vessel was injected. Vessel is normal in caliber. Vessel is angiographically normal.  Intervention  No interventions have been documented.   CARDIAC CATHETERIZATION  CARDIAC CATHETERIZATION 10/17/2015     ECHOCARDIOGRAM  ECHOCARDIOGRAM COMPLETE 12/25/2022  Narrative ECHOCARDIOGRAM REPORT    Patient Name:   Dana Horton Date of Exam: 12/25/2022 Medical Rec #:  [id number]                     Height:       63.0 in Accession #:    [id number]                     Weight:       130.1 lb Date of Birth:  05/10/1945                    BSA:          1.611 m Patient Age:    76 years                      BP:           102/33 mmHg Patient Gender: F                             HR:           72 bpm. Exam Location:  Inpatient  Procedure: 2D Echo, Cardiac Doppler and Color Doppler  Indications:    CHF  History:        Patient has no prior history of Echocardiogram examinations. CHF, COPD, Arrythmias:Atrial Fibrillation; Risk Factors:Hypertension and Dyslipidemia.  Sonographer:    Melton Krebs RDCS, FE, PE Referring Phys: [id number] JAN A MANSY  IMPRESSIONS   1. Left ventricular ejection fraction, by estimation, is 65 to 70%. The left ventricle has normal function. The left ventricle has no regional wall motion abnormalities. There is moderate asymmetric left ventricular hypertrophy of the basal-septal segment. Left ventricular diastolic parameters are consistent with Grade III diastolic dysfunction (restrictive). 2. Right ventricular systolic function is normal. The right ventricular size is normal. There is moderately elevated pulmonary artery systolic pressure. The estimated right ventricular systolic pressure is 59.3 mmHg. 3. Left atrial size was severely dilated. 4. Right atrial size was moderately dilated. 5. The mitral valve is thickened with mildly restricted leaflet motion. The left atrium is severely dialted with dialtion of the MV annulus. There is severe central MR. The mitral valve is abnormal. Severe mitral valve regurgitation. No evidence of mitral stenosis. 6. Tricuspid valve regurgitation is moderate. 7. The aortic valve is tricuspid. Aortic valve regurgitation is mild to moderate. No aortic stenosis is present. Aortic valve area, by VTI measures  2.45 cm. Aortic valve mean gradient measures 9.0 mmHg. Aortic valve Vmax measures 2.03 m/s. 8. The inferior vena cava is dilated in size with <50% respiratory variability, suggesting  right atrial pressure of 15 mmHg.  Conclusion(s)/Recommendation(s): Consider TEE to further evalaute valvular disease if clinically indicated.  FINDINGS Left Ventricle: Left ventricular ejection fraction, by estimation, is 65 to 70%. The left ventricle has normal function. The left ventricle has no regional wall motion abnormalities. The left ventricular internal cavity size was normal in size. There is moderate asymmetric left ventricular hypertrophy of the basal-septal segment. Left ventricular diastolic parameters are consistent with Grade III diastolic dysfunction (restrictive).  Right Ventricle: The right ventricular size is normal. No increase in right ventricular wall thickness. Right ventricular systolic function is normal. There is moderately elevated pulmonary artery systolic pressure. The tricuspid regurgitant velocity is 3.51 m/s, and with an assumed right atrial pressure of 10 mmHg, the estimated right ventricular systolic pressure is 59.3 mmHg.  Left Atrium: Left atrial size was severely dilated.  Right Atrium: Right atrial size was moderately dilated.  Pericardium: There is no evidence of pericardial effusion.  Mitral Valve: The mitral valve is thickened with mildly restricted leaflet motion. The left atrium is severely dialted with dialtion of the MV annulus. There is severe central MR. The mitral valve is abnormal. There is mild thickening of the mitral valve leaflet(s). Mild to moderate mitral annular calcification. Severe mitral valve regurgitation. No evidence of mitral valve stenosis.  Tricuspid Valve: The tricuspid valve is normal in structure. Tricuspid valve regurgitation is moderate . No evidence of tricuspid stenosis.  Aortic Valve: The aortic valve is tricuspid. Aortic valve regurgitation is mild to moderate. Aortic regurgitation PHT measures 270 msec. No aortic stenosis is present. Aortic valve mean gradient measures 9.0 mmHg. Aortic valve peak gradient measures  16.5 mmHg. Aortic valve area, by VTI measures 2.45 cm.  Pulmonic Valve: The pulmonic valve was normal in structure. Pulmonic valve regurgitation is not visualized. No evidence of pulmonic stenosis.  Aorta: The aortic root is normal in size and structure.  Venous: The inferior vena cava is dilated in size with less than 50% respiratory variability, suggesting right atrial pressure of 15 mmHg.  IAS/Shunts: No atrial level shunt detected by color flow Doppler.   LEFT VENTRICLE PLAX 2D LVIDd:         4.00 cm   Diastology LVIDs:         2.50 cm   LV e' medial:    5.77 cm/s LV PW:         1.10 cm   LV E/e' medial:  29.8 LV IVS:        1.50 cm   LV e' lateral:   6.42 cm/s LVOT diam:     1.90 cm   LV E/e' lateral: 26.8 LV SV:         104 LV SV Index:   65 LVOT Area:     2.84 cm   RIGHT VENTRICLE RV S prime:     12.90 cm/s TAPSE (M-mode): 1.7 cm  LEFT ATRIUM             Index        RIGHT ATRIUM           Index LA diam:        4.30 cm 2.67 cm/m   RA Area:     20.40 cm LA Vol (A2C):   96.4 ml 59.86 ml/m  RA Volume:   56.70 ml  35.21 ml/m LA Vol (A4C):   85.3 ml 52.96 ml/m LA Biplane Vol: 91.9 ml 57.06 ml/m AORTIC VALVE AV Area (Vmax):    2.54 cm AV Area (Vmean):   2.30 cm AV Area (VTI):     2.45 cm AV Vmax:           203.00 cm/s AV Vmean:          143.000 cm/s AV VTI:            0.426 m AV Peak Grad:      16.5 mmHg AV Mean Grad:      9.0 mmHg LVOT Vmax:         182.00 cm/s LVOT Vmean:        116.000 cm/s LVOT VTI:          0.368 m LVOT/AV VTI ratio: 0.86 AI PHT:            270 msec  AORTA Ao Root diam: 2.60 cm  MITRAL VALVE                TRICUSPID VALVE MV Area (PHT): 2.79 cm     TR Peak grad:   49.3 mmHg MV Decel Time: 272 msec     TR Vmax:        351.00 cm/s MV E velocity: 172.00 cm/s MV A velocity: 49.90 cm/s   SHUNTS MV E/A ratio:  3.45         Systemic VTI:  0.37 m Systemic Diam: 1.90 cm  Arvilla Meres MD Electronically signed by Arvilla Meres  MD Signature Date/Time: 12/25/2022/10:16:24 AM    Final    MONITORS  LONG TERM MONITOR-LIVE TELEMETRY (3-14 DAYS) 08/28/2022  Narrative Patch Wear Time:  12 days and 19 hours (2024-05-01T15:53:56-0400 to 2024-05-14T11:31:55-0400)  Patient had a min HR of 55 bpm, max HR of 182 bpm, and avg HR of 73 bpm. Predominant underlying rhythm was Sinus Rhythm.  There were no triggered or diary events.  There were no pauses of 3 seconds or greater and no episodes of second or third-degree AV nodal block.  10 Supraventricular Tachycardia runs occurred, the run with the fastest interval lasting 8 beats with a max rate of 182 bpm, the longest lasting 11 beats with an avg rate of 162 bpm.  Atrial Fibrillation occurred (<1% burden), ranging from 105-140 bpm (avg of 118 bpm), the longest lasting 1 min 7 secs with an avg rate of 118 bpm.  Isolated SVEs were rare (<1.0%), SVE Couplets were rare (<1.0%), and SVE Triplets were rare (<1.0%).  Isolated VEs were rare (<1.0%), VE Couplets were rare (<1.0%), and no VE Triplets were present. Ventricular Bigeminy was present. MD notification criteria for First Documentation of Atrial Fibrillation  met - notified Richard C. (RN) on 27 Aug 2022 at 1:15 PM CDT (DR).           DIAGNOSTIC Echocardiogram: Thickening of both mitral leaflets with MAC, thickening of the submitral apparatus of the anterior mitral leaflet, severe left atrial enlargement, severe mitral regurgitation, prominent basal septal hypertrophy, moderate to severe tricuspid regurgitation, eccentric aortic regurgitation, moderate aortic regurgitation. Indeterminate diastology with elevated LAP    Assessment & Plan   Mixed and Multiple Valve Disease - Severe mitral regurgitation, moderate tricuspid regurgitation, and moderate aortic regurgitation. Noted basal septal hypertrophy and severe left atrial enlargement. - She will need a maintenance diuretic for euvolemia; I would favor SGLT2i; have not  yet started this as we are considering procedure - Consented for TEE  for etiology of MR (rule out post inflammatory) and severity of disease  [facility] has been requested to perform a transesophageal echocardiogram on Garvin Fila Gurski for mitral valve/aortic valve/tricuspid valve disease.  After careful review of history and examination, the risks and benefits of transesophageal echocardiogram have been explained including risks of esophageal damage, perforation (1:10,000 risk), bleeding, pharyngeal hematoma as well as other potential complications associated with conscious sedation including aspiration, arrhythmia, respiratory failure and death. Alternatives to treatment were discussed, questions were answered. Patient is willing to proceed.    Paroxsymal Atrial Fibrillation - CHASDVASC 4-5 Managed on amiodarone, Eliquis, and metoprolol. -Continue amiodarone 400mg  PO BID for one week, then 200mg  PO daily. -Continue Eliquis 5mg  PO BID. -Continue metoprolol 25mg  PO BID, can be reduced to 12.5mg  PO BID if persistent hypertension.  Heart Failure with Preserved Ejection Fraction (HFpEF) - Likely increased left atrial pressure. Diuretic was held due to hypotension. -Attempt to start baseline diuretic. -Consider starting SGLT2 inhibitor if patient is amenable (post TEE).  COPD and Tobacco Abuse - Weaned to room air only. -Advise to remain tobacco-free.  Hyperlipidemia, Aortic Atherosclerosis, and Peripheral Arterial Disease Evidenced by femoral calcifications. -Continue statin therapy.    For questions or updates, please contact [facility] Please consult [email or web address] for contact info under Cardiology/STEMI.      Riley Lam, MD FASE [facility] Cardiologist [facility]  [address] [place], [address] [place], [place] [address] [phone]  9:26 AM

## 2022-12-31 NOTE — Progress Notes (Signed)
Mobility Specialist - Progress Note  Pre-mobility: 67 bpm HR, 93% SpO2 During mobility: 75 bpm HR, 87% SpO2 Post-mobility: 71 bpm HR, 94% SPO2   12/31/22 1041  Mobility  Activity Ambulated independently in hallway  Level of Assistance Independent after set-up  Assistive Device None  Distance Ambulated (ft) 450 ft  Range of Motion/Exercises Active  Activity Response Tolerated well  Mobility Referral Yes  $Mobility charge 1 Mobility  Mobility Specialist Start Time (ACUTE ONLY) 1030  Mobility Specialist Stop Time (ACUTE ONLY) 1041  Mobility Specialist Time Calculation (min) (ACUTE ONLY) 11 min   Pt was found on recliner chair and agreeable to ambulate. Pt had no complaints during session. Upon returning to room SPO2 decreased to 87%, although able to increase back >90% within 30 sec. At EOS was left on recliner chair with all needs met. Call bell in reach and RN notified of session.  Billey Chang Mobility Specialist

## 2023-01-01 ENCOUNTER — Inpatient Hospital Stay (HOSPITAL_COMMUNITY): Payer: Medicare Other | Admitting: Anesthesiology

## 2023-01-01 ENCOUNTER — Encounter (HOSPITAL_COMMUNITY)
Admission: EM | Disposition: A | Discharge status: Home / Self Care | Payer: Self-pay | Source: Home / Self Care | Attending: Internal Medicine

## 2023-01-01 ENCOUNTER — Inpatient Hospital Stay (HOSPITAL_COMMUNITY): Payer: Medicare Other

## 2023-01-01 ENCOUNTER — Encounter (HOSPITAL_COMMUNITY): Payer: Self-pay | Admitting: Family Medicine

## 2023-01-01 DIAGNOSIS — F17210 Nicotine dependence, cigarettes, uncomplicated: Secondary | ICD-10-CM

## 2023-01-01 DIAGNOSIS — I340 Nonrheumatic mitral (valve) insufficiency: Secondary | ICD-10-CM

## 2023-01-01 DIAGNOSIS — J9601 Acute respiratory failure with hypoxia: Secondary | ICD-10-CM | POA: Insufficient documentation

## 2023-01-01 DIAGNOSIS — I361 Nonrheumatic tricuspid (valve) insufficiency: Secondary | ICD-10-CM

## 2023-01-01 DIAGNOSIS — I3139 Other pericardial effusion (noninflammatory): Secondary | ICD-10-CM | POA: Diagnosis not present

## 2023-01-01 DIAGNOSIS — I351 Nonrheumatic aortic (valve) insufficiency: Secondary | ICD-10-CM

## 2023-01-01 HISTORY — PX: TEE WITHOUT CARDIOVERSION: SHX5443

## 2023-01-01 LAB — BASIC METABOLIC PANEL WITH GFR
Anion gap: 12 (ref 5–15)
BUN: 22 mg/dL (ref 8–23)
CO2: 23 mmol/L (ref 22–32)
Calcium: 8.9 mg/dL (ref 8.9–10.3)
Chloride: 104 mmol/L (ref 98–111)
Creatinine, Ser: 0.71 mg/dL (ref 0.44–1.00)
GFR, Estimated: >60 mL/min
Glucose, Bld: 97 mg/dL (ref 70–99)
Potassium: 3.7 mmol/L (ref 3.5–5.1)
Sodium: 139 mmol/L (ref 135–145)

## 2023-01-01 LAB — ECHO TEE
MV M vel: 5.26 m/s
MV Peak grad: 110.7 mm[Hg]
Radius: 1 cm

## 2023-01-01 LAB — CBC
HCT: 25.7 % — ABNORMAL LOW (ref 36.0–46.0)
Hemoglobin: 7.8 g/dL — ABNORMAL LOW (ref 12.0–15.0)
MCH: 29.9 pg (ref 26.0–34.0)
MCHC: 30.4 g/dL (ref 30.0–36.0)
MCV: 98.5 fL (ref 80.0–100.0)
Platelets: 142 10*3/uL — ABNORMAL LOW (ref 150–400)
RBC: 2.61 MIL/uL — ABNORMAL LOW (ref 3.87–5.11)
RDW: 19.3 % — ABNORMAL HIGH (ref 11.5–15.5)
WBC: 7.1 10*3/uL (ref 4.0–10.5)
nRBC: 0 % (ref 0.0–0.2)

## 2023-01-01 LAB — APTT: aPTT: 41 s — ABNORMAL HIGH (ref 24–36)

## 2023-01-01 LAB — HEPARIN LEVEL (UNFRACTIONATED): Heparin Unfractionated: >1.1 [IU]/mL — ABNORMAL HIGH (ref 0.30–0.70)

## 2023-01-01 SURGERY — TRANSESOPHAGEAL ECHOCARDIOGRAM (TEE)
Anesthesia: Monitor Anesthesia Care

## 2023-01-01 MED ORDER — PROPOFOL 10 MG/ML IV BOLUS
INTRAVENOUS | Status: DC | PRN
  Administered 2023-01-01: 50 mg via INTRAVENOUS
  Administered 2023-01-01: 20 mg via INTRAVENOUS

## 2023-01-01 MED ORDER — IPRATROPIUM-ALBUTEROL 0.5-2.5 (3) MG/3ML IN SOLN
3.0000 mL | RESPIRATORY_TRACT | Status: DC | PRN
  Administered 2023-01-01: 3 mL via RESPIRATORY_TRACT

## 2023-01-01 MED ORDER — HEPARIN (PORCINE) 25000 UT/250ML-% IV SOLN
1100.0000 [IU]/h | INTRAVENOUS | Status: DC
  Administered 2023-01-01: 900 [IU]/h via INTRAVENOUS
  Filled 2023-01-01: qty 250

## 2023-01-01 MED ORDER — PHENYLEPHRINE HCL-NACL 20-0.9 MG/250ML-% IV SOLN
INTRAVENOUS | Status: DC | PRN
Start: 2023-01-01 — End: 2023-01-01
  Administered 2023-01-01: 30 ug/min via INTRAVENOUS

## 2023-01-01 MED ORDER — PROPOFOL 500 MG/50ML IV EMUL
INTRAVENOUS | Status: DC | PRN
  Administered 2023-01-01: 50 ug/kg/min via INTRAVENOUS

## 2023-01-01 MED ORDER — FUROSEMIDE 40 MG PO TABS
40.0000 mg | ORAL_TABLET | Freq: Every day | ORAL | Status: DC
  Administered 2023-01-01 – 2023-01-02 (×2): 40 mg via ORAL
  Filled 2023-01-01 (×2): qty 1

## 2023-01-01 MED ORDER — METOPROLOL TARTRATE 12.5 MG HALF TABLET
12.5000 mg | ORAL_TABLET | Freq: Two times a day (BID) | ORAL | Status: DC
  Administered 2023-01-01 – 2023-01-02 (×3): 12.5 mg via ORAL
  Filled 2023-01-01 (×3): qty 1

## 2023-01-01 MED ORDER — IPRATROPIUM-ALBUTEROL 0.5-2.5 (3) MG/3ML IN SOLN
RESPIRATORY_TRACT | Status: AC
  Filled 2023-01-01: qty 3

## 2023-01-01 MED ORDER — LIDOCAINE 2% (20 MG/ML) 5 ML SYRINGE
INTRAMUSCULAR | Status: DC | PRN
  Administered 2023-01-01: 60 mg via INTRAVENOUS

## 2023-01-01 MED ORDER — PHENYLEPHRINE HCL (PRESSORS) 10 MG/ML IV SOLN
INTRAVENOUS | Status: DC | PRN
Start: 2023-01-01 — End: 2023-01-01
  Administered 2023-01-01 (×5): 160 ug via INTRAVENOUS

## 2023-01-01 MED ORDER — SODIUM CHLORIDE 0.9 % IV SOLN
INTRAVENOUS | Status: DC | PRN
Start: 2023-01-01 — End: 2023-01-01

## 2023-01-01 NOTE — Progress Notes (Addendum)
PROGRESS NOTE    Dana Horton  DVV:[id number] DOB: Jan 16, 1946 DOA: 12/24/2022 PCP: Crist Fat, MD     Brief Narrative:  Dana Horton is a 77 year old female with past medical history significant for A-fib, CKD, COPD, diastolic heart failure, OSA on CPAP, hypertension who presented with acute onset of palpitation, generalized weakness.  She was found to have low blood pressure in the 80s.  She was scheduled to have iron infusion but was noted to be in A-fib RVR with heart rate 1 40-1 60s, symptomatic with shortness of breath.  She was then sent to the emergency department.  In the ED, she was in A-fib RVR with heart rate up to 167, BP 96/63, noted to be in respiratory distress.  She was subsequently placed on BiPAP and admitted to stepdown unit.  Pulmonology as well as cardiology consulted.  Patient required amiodarone drip.  Patient converted to normal sinus rhythm.  New events last 24 hours / Subjective: Complains of mild shortness of breath today.  Remains in normal sinus rhythm.  TEE today.  Addendum: heard from cardiology that TEE revealed severe MR and significant AI. Recommended to stay at [facility].   Assessment & Plan:   Principal Problem:   Acute respiratory failure with hypoxia (HCC) Active Problems:   Essential hypertension   OSA on CPAP   Paroxysmal atrial fibrillation with RVR (HCC)   Acute on chronic anemia   Chronic obstructive pulmonary disease (HCC)   Stage 3a chronic kidney disease (HCC)   GERD without esophagitis   Peripheral neuropathy   Acute on chronic diastolic CHF (congestive heart failure) (HCC)   Acute hypoxemic respiratory failure -Resolved, off BiPAP and currently on room air  Paroxysmal A-fib with RVR -Reportedly had not taken metoprolol, Eliquis, flecainide on 9/29 or 9/30 due to concern about low BP -Flecainide, metoprolol --> PO amiodarone, Lopressor -Eliquis   Acute on chronic diastolic CHF -Echo  showed EF of 65 to 70%, moderate asymmetric left ventricular hypertrophy of the basal septal segment, G3 DD, normal right ventricular systolic function, left atrium severely dilated, severe MR, moderate aortic valve regurgitation.  -Stable  -Planning on TEE 10/8 to evaluate MR  Acute on chronic normocytic anemia -Baseline hemoglobin 8-9.  Anemia panel in 08/2022 had shown low iron and % sat.  Previous in 2018 EGD normal and colonoscopy had shown diverticulosis and polyps.  -Eliquis, continue to monitor for any signs of bleeding -FOBT negative  -Ferrous sulfate -Hemoglobin stable  Hyperlipidemia -Crestor  GERD -Protonix  OSA -CPAP nightly  History of bladder cancer, lung nodules -Family requesting getting a PET scan in hospital.  Unable to do inpatient, offered referral to oncology outpatient on discharge (placed order 10/8).    DVT prophylaxis:  apixaban (ELIQUIS) tablet 5 mg  Code Status: DNR Family Communication: No family at bedside, updated daughter over the phone 10/7 Disposition Plan: Home Status is: Inpatient Remains inpatient appropriate because: TEE 10/8   Antimicrobials:  Anti-infectives (From admission, onward)    None        Objective: Vitals:   01/01/23 0755 01/01/23 0756 01/01/23 0901 01/01/23 1006  BP:   (!) 115/43 (!) 120/45  Pulse:   67 68  Resp:    (!) 26  Temp:    (!) 97.2 F (36.2 C)  TempSrc:    Temporal  SpO2: (!) 86% 97% 97% 96%  Weight:      Height:        Intake/Output Summary (Last 24 hours)  at 01/01/2023 1043 Last data filed at 01/01/2023 0900 Gross per 24 hour  Intake 900 ml  Output 900 ml  Net 0 ml   Filed Weights   12/29/22 0500 12/30/22 0713 12/31/22 0500  Weight: 59.2 kg 59.2 kg 61.5 kg    Examination:  General exam: Appears calm and comfortable  Respiratory system: Clear to auscultation. Respiratory effort normal. No respiratory distress. No conversational dyspnea.  On room air Cardiovascular system: S1 & S2 heard,  RR, no edema Gastrointestinal system: Abdomen is nondistended, soft and nontender. Normal bowel sounds heard. Central nervous system: Alert and oriented. No focal neurological deficits. Speech clear.  Extremities: Symmetric in appearance  Skin: No rashes, lesions or ulcers on exposed skin  Psychiatry: Judgement and insight appear normal. Mood & affect appropriate.   Data Reviewed: I have personally reviewed following labs and imaging studies  CBC: Recent Labs  Lab 12/27/22 0258 12/28/22 0306 12/29/22 0339 12/30/22 0521 01/01/23 0502  WBC 6.7 8.4 7.9 7.8 7.1  HGB 8.7* 8.3* 8.3* 8.2* 7.8*  HCT 28.8* 27.2* 26.9* 26.5* 25.7*  MCV 98.0 96.5 95.4 96.0 98.5  PLT 146* 151 151 151 142*   Basic Metabolic Panel: Recent Labs  Lab 12/27/22 0258 12/28/22 0306 12/29/22 0339 12/30/22 0521 01/01/23 0502  NA 139 137 134* 137 139  K 3.7 3.8 3.6 3.8 3.7  CL 104 103 102 103 104  CO2 26 24 24 25 23   GLUCOSE 110* 122* 103* 100* 97  BUN 17 20 21 23 22   CREATININE 0.78 0.80 0.67 0.73 0.71  CALCIUM 8.6* 8.5* 8.7* 8.9 8.9   GFR: Estimated Creatinine Clearance: 49.5 mL/min (by C-G formula based on SCr of 0.71 mg/dL). Liver Function Tests: No results for input(s): "AST", "ALT", "ALKPHOS", "BILITOT", "PROT", "ALBUMIN" in the last 168 hours.  No results for input(s): "LIPASE", "AMYLASE" in the last 168 hours. No results for input(s): "AMMONIA" in the last 168 hours. Coagulation Profile: No results for input(s): "INR", "PROTIME" in the last 168 hours. Cardiac Enzymes: No results for input(s): "CKTOTAL", "CKMB", "CKMBINDEX", "TROPONINI" in the last 168 hours. BNP (last 3 results) No results for input(s): "PROBNP" in the last 8760 hours. HbA1C: No results for input(s): "HGBA1C" in the last 72 hours. CBG: No results for input(s): "GLUCAP" in the last 168 hours. Lipid Profile: No results for input(s): "CHOL", "HDL", "LDLCALC", "TRIG", "CHOLHDL", "LDLDIRECT" in the last 72 hours. Thyroid  Function Tests: No results for input(s): "TSH", "T4TOTAL", "FREET4", "T3FREE", "THYROIDAB" in the last 72 hours. Anemia Panel: No results for input(s): "VITAMINB12", "FOLATE", "FERRITIN", "TIBC", "IRON", "RETICCTPCT" in the last 72 hours.  Sepsis Labs: No results for input(s): "PROCALCITON", "LATICACIDVEN" in the last 168 hours.  Recent Results (from the past 240 hour(s))  MRSA Next Gen by PCR, Nasal     Status: None   Collection Time: 12/24/22 11:16 PM   Specimen: Nasal Mucosa; Nasal Swab  Result Value Ref Range Status   MRSA by PCR Next Gen NOT DETECTED NOT DETECTED Final    Comment: (NOTE) The GeneXpert MRSA Assay (FDA approved for NASAL specimens only), is one component of a comprehensive MRSA colonization surveillance program. It is not intended to diagnose MRSA infection nor to guide or monitor treatment for MRSA infections. Test performance is not FDA approved in patients less than 40 years old. Performed at [facility], [address], [place], [place] [address]       Radiology Studies: No results found.    Scheduled Meds:  [facility]  Hold] amiodarone  400 mg Oral BID   [MAR Hold] apixaban  5 mg Oral BID   [MAR Hold] budesonide (PULMICORT) nebulizer solution  0.5 mg Nebulization BID   [MAR Hold] docusate sodium  100 mg Oral Daily   [MAR Hold] ferrous sulfate  325 mg Oral Q breakfast   [MAR Hold] fluticasone  2 spray Each Nare Daily   [MAR Hold] levalbuterol  0.63 mg Nebulization BID   [MAR Hold] metoprolol tartrate  25 mg Oral BID   [MAR Hold] montelukast  10 mg Oral QPM   [MAR Hold] pantoprazole  40 mg Oral Daily   [MAR Hold] pregabalin  50 mg Oral BID   [MAR Hold] rosuvastatin  5 mg Oral QODAY   Continuous Infusions:     LOS: 8 days   Time spent: 25 minutes   Noralee Stain, DO [facility] 01/01/2023, 10:43 AM   Available via Epic secure chat 7am-7pm After these hours, please refer to coverage provider listed on [email or web address]

## 2023-01-01 NOTE — Progress Notes (Signed)
Patient takes a Metoprolol 25mg  24-hr pill q12hr. States she lost 35 pounds in 3 months and still takes the same dose but she's bottoming out every time she takes it now.   Made on-call NP aware. We need parameters for her. She goes straight to 90s/40s, HR low 60s, and MAP of high 50s/low 60s.   Monitoring patient. Educated on ambulating to BR with low BP. Calling for help.

## 2023-01-01 NOTE — Progress Notes (Signed)
PHARMACY - ANTICOAGULATION CONSULT NOTE  Pharmacy Consult for heparin infusion (bridging off of apixaban) Indication: atrial fibrillation  Allergies  Allergen Reactions   Albuterol Palpitations   Lisinopril Cough    Patient Measurements: Height: 5\' 3"  (160 cm) Weight: 61.5 kg (135 lb 9.3 oz) IBW/kg (Calculated) : 52.4 Heparin Dosing Weight: 61.5 kg  Vital Signs: Temp: 97.9 F (36.6 C) (10/08 1213) Temp Source: Temporal (10/08 1213) BP: 138/43 (10/08 1330) Pulse Rate: 67 (10/08 1330)  Labs: Recent Labs    12/30/22 0521 01/01/23 0502  HGB 8.2* 7.8*  HCT 26.5* 25.7*  PLT 151 142*  CREATININE 0.73 0.71    Estimated Creatinine Clearance: 49.5 mL/min (by C-G formula based on SCr of 0.71 mg/dL).   Medical History: Past Medical History:  Diagnosis Date   Atrial fibrillation with rapid ventricular response (HCC) 04/10/2017   Bladder tumor 08/06/2022   BMI 28.0-28.9,adult 05/21/2022   Bronchitis 03/07/2022   Chronic anticoagulation 06/10/2019   Eliquis   Chronic diastolic heart failure (HCC)    Chronic obstructive pulmonary disease (HCC) 05/21/2022   Cigarette smoker 05/21/2022   CKD (chronic kidney disease) stage 3, GFR 30-59 ml/min (HCC)    Demand ischemia (HCC) 09/25/2016   Normal coronaries Jan 2019 when admitted with AF with RVR and chest pain   Dysrhythmia    a-fib   Essential hypertension    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 06/29/2022   GERD (gastroesophageal reflux disease)    HCAP (healthcare-associated pneumonia) 04/10/2017   History of bladder cancer    Hypercholesterolemia 05/21/2022   Iron deficiency anemia due to chronic blood loss 06/29/2022   Lung nodule    OSA on CPAP 04/08/2017   Osteopenia 05/21/2022   Osteoporosis    PAF (paroxysmal atrial fibrillation) (HCC) 09/24/2016   CHADS2 vasc=3   Sleep apnea    cpap  setting at 2.5 per patient    Stage 3a chronic kidney disease (HCC) 05/21/2022   Symptomatic  anemia 08/07/2022   Tobacco abuse     Medications:  Scheduled:   [MAR Hold] amiodarone  400 mg Oral BID   [MAR Hold] budesonide (PULMICORT) nebulizer solution  0.5 mg Nebulization BID   [MAR Hold] docusate sodium  100 mg Oral Daily   [MAR Hold] ferrous sulfate  325 mg Oral Q breakfast   [MAR Hold] fluticasone  2 spray Each Nare Daily   [MAR Hold] levalbuterol  0.63 mg Nebulization BID   metoprolol tartrate  12.5 mg Oral BID   [MAR Hold] montelukast  10 mg Oral QPM   [MAR Hold] pantoprazole  40 mg Oral Daily   [MAR Hold] pregabalin  50 mg Oral BID   [MAR Hold] rosuvastatin  5 mg Oral QODAY    Assessment: 77 yo F with history of Afib (on apixaban 5mg  BID), HFpEF, COPD, HLD, aortic atherosclerosis, and PAD presents with mixed and multiple valve disease. Patient underwent TEE today and pharmacy has been consulted to dose heparin infusion for Afib.   Patient's last dose of apixaban 5mg  was 01/01/23 at 09:03 Hgb 7.8, Plt 142 - low, but stable  Goal of Therapy:  Heparin level 0.3-0.7 units/ml Monitor platelets by anticoagulation protocol: Yes   Plan:  Discontinue apixaban Start heparin infusion at 900 units/hr at 21:00  No bolus, given recent apixaban administration Check baseline heparin level and aPTT Check aPTT in 8 hours after infusion is inifiated Monitor daily CBC, aPTT and heparin levels (until correlating with recent apixaban administration), and for s/sx of bleeding F/u  surgical plans   Wilburn Cornelia, PharmD, BCPS Clinical Pharmacist 01/01/2023 3:28 PM   Please refer to [facility] for pharmacy phone number

## 2023-01-01 NOTE — Transfer of Care (Signed)
Immediate Anesthesia Transfer of Care Note  Patient: Dana Horton  Procedure(s) Performed: TRANSESOPHAGEAL ECHOCARDIOGRAM  Patient Location: Cath Lab  Anesthesia Type:MAC  Level of Consciousness: awake and alert   Airway & Oxygen Therapy: Patient Spontanous Breathing and Patient connected to face mask oxygen  Post-op Assessment: Report given to RN and Post -op Vital signs reviewed and stable  Post vital signs: Reviewed and stable  Last Vitals:  Vitals Value Taken Time  BP 107/42 01/01/23 1212  Temp    Pulse 64 01/01/23 1215  Resp 20 01/01/23 1215  SpO2 90 % 01/01/23 1215  Vitals shown include unfiled device data.  Last Pain:  Vitals:   01/01/23 1006  TempSrc: Temporal  PainSc:          Complications: No notable events documented.

## 2023-01-01 NOTE — Anesthesia Postprocedure Evaluation (Signed)
Anesthesia Post Note  Patient: Dana Horton  Procedure(s) Performed: TRANSESOPHAGEAL ECHOCARDIOGRAM     Patient location during evaluation: Cath Lab Anesthesia Type: MAC Level of consciousness: awake and alert Pain management: pain level controlled Vital Signs Assessment: post-procedure vital signs reviewed and stable Respiratory status: spontaneous breathing, nonlabored ventilation, respiratory function stable and patient connected to nasal cannula oxygen Cardiovascular status: stable and blood pressure returned to baseline Postop Assessment: no apparent nausea or vomiting Anesthetic complications: no   No notable events documented.  Last Vitals:  Vitals:   01/01/23 1315 01/01/23 1330  BP: (!) 141/47 (!) 138/43  Pulse: 66 67  Resp: (!) 26 (!) 25  Temp:    SpO2: 100% 100%    Last Pain:  Vitals:   01/01/23 1226  TempSrc:   PainSc: 0-No pain                 Collene Schlichter

## 2023-01-01 NOTE — Interval H&P Note (Signed)
History and Physical Interval Note:  01/01/2023 11:31 AM  Dana Horton  has presented today for surgery, with the diagnosis of severe MR.  The various methods of treatment have been discussed with the patient and family. After consideration of risks, benefits and other options for treatment, the patient has consented to  Procedure(s): TRANSESOPHAGEAL ECHOCARDIOGRAM (N/A) as a surgical intervention.  The patient's history has been reviewed, patient examined, no change in status, stable for surgery.  I have reviewed the patient's chart and labs.  Questions were answered to the patient's satisfaction.     Ibn Stief Cristal Deer

## 2023-01-01 NOTE — CV Procedure (Signed)
TRANSESOPHAGEAL ECHOCARDIOGRAM   NAME:  Lamis Mccully Bruski   MRN: [id number] DOB:  June 15, 1945   ADMIT DATE: 12/24/2022  INDICATIONS: Severe valve disease  PROCEDURE:   Informed consent was obtained prior to the procedure. The risks, benefits and alternatives for the procedure were discussed and the patient comprehended these risks.  Risks include, but are not limited to, cough, sore throat, vomiting, nausea, somnolence, esophageal and stomach trauma or perforation, bleeding, low blood pressure, aspiration, pneumonia, infection, trauma to the teeth and death.    Procedural time out performed.     Patient received monitored anesthesia care under the supervision of Dr. Desmond Lope. Patient received a total of 96.75 mg propofol, 60 mg lidocaine, 920 mcg phenylephrine during the procedure.   The transesophageal probe was inserted in the esophagus and stomach without difficulty and multiple views were obtained.    COMPLICATIONS:    There were no immediate complications.  FINDINGS:  LEFT VENTRICLE: EF = 65-70%. No regional wall motion abnormalities.  RIGHT VENTRICLE: Normal size and function.   LEFT ATRIUM: No thrombus/mass. Lipomatous septum  LEFT ATRIAL APPENDAGE: No thrombus/mass.   RIGHT ATRIUM: No thrombus/mass.  AORTIC VALVE:  Trileaflet. Lamble's excrescence. Severe regurgitation. No vegetation.  MITRAL VALVE:    Mildly restricted leaflet motion. Severe regurgitation, with systolic flow reversal in pulmonary veins. Flail P3 leaflet with possible cleft between P2-P3. No vegetation.  TRICUSPID VALVE: Normal structure. Moderate regurgitation. No vegetation.  PULMONIC VALVE: Grossly normal structure. Trivial regurgitation. No apparent vegetation.  INTERATRIAL SEPTUM: No PFO or ASD seen by color Doppler.  PERICARDIUM: Small effusion noted.  DESCENDING AORTA: Moderate diffuse plaque seen  Dilated pulmonary artery, 4.52 cm. Severely elevated PA pressures, 60  mmHg.   CONCLUSION: Severe MR with flail P3 leaflet and possible cleft between P2-P3. Severe aortic regurgitation. Severely dilated PA with severely elevated PA pressure.   Jodelle Red, MD, PhD, [facility]  [facility]  [place]  [facility] at [facility] at [facility] [address] [place], [place] [address] [phone]   12:10 PM

## 2023-01-01 NOTE — Progress Notes (Signed)
Pt reported feeling short of breath. O2 saturation is 100% but placed pt on 3L of O2 per pt request for comfort. Respiratory therapy called to administer nebulizer treatment.

## 2023-01-01 NOTE — Consult Note (Incomplete)
[facility]   MULTIDISCIPLINARY HEART VALVE TEAM  Inpatient MitraClip Consultation:   Patient ID: Dana Horton; [id number]; 02-Jun-1945   Admit date: 12/24/2022 Date of Consult: 01/01/2023  Primary Care Provider: Crist Fat, MD Primary Cardiologist: Dr. Dulce Sellar, MD  Patient Profile:   Dana Horton is a 77 y.o. female with a hx of uncontrolled atrial fibrillation on Eliquis, HFpEF, HTN, current tobacco use with COPD, OSA on CPAP, CKD stage IIIa, hx of bladder cancer, HLD, GERD, iron deficiency anemia, and newly dx severe mitral regurgitation/tricuspid regurgitation/aortic regurgitation who is being seen today for the evaluation of potential mTEER at the request of Dr. Rolly Salter.  History of Present Illness:   Dana Horton lives in [place] with her husband. They have three children with one daughter who lives nearby and helps with any needs. She reports having a long issue with uncontrolled atrial fibrillation in the past and however has been able to maintain a good quality of life. More recently she has noted that activities which previously were easy to perform have become more difficult secondary to progressive SOB. She notes that she has smoked cigarettes since she was 77yo and contributed her symptoms to this. Additionally, she mentions that LE edema was previously an issues but has lost quite a bit of weight (unintentionally) over the last several months, making her edema less noticeable. On her good days, she states she is able to walk the length of a grocery store aisle but other times she will have difficulty with small tasks inside her home. Her daughter reports that she recently requested that her groceries be delivered given her symptoms. She has intermittent palpitations with associated chest tightness. She also reports 2-3 pillow orthopnea. Otherwise no dizziness, bleeding in stool or urine. No recent fevers. She ambulates  with no assistive devices.   Dana Horton is followed by Dr. Dulce Sellar for her cardiology care. She was initially seen after being admitted to [facility] in 2018 with chest pain and found to have new onset atrial fibrillation with RVR. She self converted to NSR without DCCV. She was treated with metoprolol however continued to have issues with rate control. Echocardiogram showed normal LV function at 60-65% with mild MR and TR. LHC with non-obstructive CAD. Over the last several years, she has had several breakthrough episodes with RVR and has been most recently treated with flecainide, metoprolol, and Eliquis. She was briefly seen by Dr. Duke Salvia and was unfortunately was lost to cardiology follow up for quite some time.   She reestablished ands saw Dr. Dulce Sellar 07/2022 and had complaints of hematuria but was maintaining NSR. ZIO monitor placed to evaluate AF burden that showed <1% burden with recommendations to continue flecainide and Eliquis.   She saw her PCP for an acute visit on 9/30 with acute onset of palpitations and generalized weakness. BP was found to be low with SBP in the 80's and AF with RVR with HR in the 140's. She was sent to the ED for further evaluation and was ultimately admitted to hospital service for acute respiratory distress, acute on chronic diastolic CHF, and AF with RVR. Cardiology was consulted. Echocardiogram showed normal LVEF at 65-70% with GIII DD with evidence of severe MR and moderate TR. 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 Also noted to have moderate grade III plaquing of the aorta and severely dilated pulmonary artery. R/LHC has been scheduled for tomorrow, 01/02/2023  with structural heart consultation for consideration of mTEER.   Past Medical History:  Diagnosis Date   Atrial fibrillation with rapid ventricular response (HCC) 04/10/2017   Bladder tumor 08/06/2022    BMI 28.0-28.9,adult 05/21/2022   Bronchitis 03/07/2022   Chronic anticoagulation 06/10/2019   Eliquis   Chronic diastolic heart failure (HCC)    Chronic obstructive pulmonary disease (HCC) 05/21/2022   Cigarette smoker 05/21/2022   CKD (chronic kidney disease) stage 3, GFR 30-59 ml/min (HCC)    Demand ischemia (HCC) 09/25/2016   Normal coronaries Jan 2019 when admitted with AF with RVR and chest pain   Dysrhythmia    a-fib   Essential hypertension    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 06/29/2022   GERD (gastroesophageal reflux disease)    HCAP (healthcare-associated pneumonia) 04/10/2017   History of bladder cancer    Hypercholesterolemia 05/21/2022   Iron deficiency anemia due to chronic blood loss 06/29/2022   Lung nodule    OSA on CPAP 04/08/2017   Osteopenia 05/21/2022   Osteoporosis    PAF (paroxysmal atrial fibrillation) (HCC) 09/24/2016   CHADS2 vasc=3   Sleep apnea    cpap  setting at 2.5 per patient    Stage 3a chronic kidney disease (HCC) 05/21/2022   Symptomatic anemia 08/07/2022   Tobacco abuse     Past Surgical History:  Procedure Laterality Date   ABDOMINAL HYSTERECTOMY     BLADDER SURGERY     COLONOSCOPY N/A 12/28/2016   Procedure: COLONOSCOPY;  Surgeon: Sherrilyn Rist, MD;  Location: [facility];  Service: Gastroenterology;  Laterality: N/A;   LEFT HEART CATH AND CORONARY ANGIOGRAPHY N/A 04/09/2017   Procedure: LEFT HEART CATH AND CORONARY ANGIOGRAPHY;  Surgeon: Corky Crafts, MD;  Location: [facility] INVASIVE CV LAB;  Service: Cardiovascular;  Laterality: N/A;   MOUTH SURGERY     POLYPECTOMY N/A 12/28/2016   Procedure: POLYPECTOMY with injection of Elevue;  Surgeon: Sherrilyn Rist, MD;  Location: [facility];  Service: Gastroenterology;  Laterality: N/A;   TRANSURETHRAL RESECTION OF BLADDER TUMOR N/A 08/09/2022   Procedure: TRANSURETHRAL RESECTION OF BLADDER TUMOR (TURBT) BILATERAL RETROGRADES;  Surgeon: Loletta Parish., MD;  Location: [facility];  Service: Urology;  Laterality: N/A;   TRANSURETHRAL RESECTION OF BLADDER TUMOR N/A 10/05/2022   Procedure: RESTAGING TRANSURETHRAL RESECTION OF BLADDER TUMOR (TURBT), CYSTOSCOPY, BILATERAL RETROGRADE PYELOGRAM;  Surgeon: Loletta Parish., MD;  Location: [facility];  Service: Urology;  Laterality: N/A;  1 HR     Inpatient Medications: Scheduled Meds:  [MAR Hold] amiodarone  400 mg Oral BID   [MAR Hold] apixaban  5 mg Oral BID   [MAR Hold] budesonide (PULMICORT) nebulizer solution  0.5 mg Nebulization BID   [MAR Hold] docusate sodium  100 mg Oral Daily   [MAR Hold] ferrous sulfate  325 mg Oral Q breakfast   [MAR Hold] fluticasone  2 spray Each Nare Daily   [MAR Hold] levalbuterol  0.63 mg Nebulization BID   metoprolol tartrate  12.5 mg Oral BID   [MAR Hold] montelukast  10 mg Oral QPM   [MAR Hold] pantoprazole  40 mg Oral Daily   [MAR Hold] pregabalin  50 mg Oral BID   [MAR Hold] rosuvastatin  5 mg Oral QODAY   Continuous Infusions:  PRN Meds: [MAR Hold] acetaminophen **OR** [MAR Hold] acetaminophen, [MAR Hold] magnesium hydroxide, [MAR Hold] ondansetron **OR** [MAR Hold] ondansetron (ZOFRAN) IV, [MAR Hold] mouth rinse, [MAR Hold] traZODone  Allergies:    Allergies  Allergen Reactions   Albuterol Palpitations   Lisinopril Cough    Social History:   Social History   Socioeconomic History   Marital status: Married    Spouse name: Not on file   Number of children: 3   Years of education: Not on file   Highest education level: Not on file  Occupational History   Occupation: Retired  Tobacco Use   Smoking status: Every Day    Current packs/day: 0.50    Average packs/day: 0.5 packs/day for 52.0 years (26.0 ttl pk-yrs)    Types: Cigarettes   Smokeless tobacco: Never   Tobacco comments:    1/2 pack a day  Vaping Use   Vaping status: Never Used  Substance and Sexual Activity   Alcohol use: No   Drug use: No   Sexual activity: Not  Currently  Other Topics Concern   Not on file  Social History Narrative   Not on file   Social Determinants of Health   Financial Resource Strain: Not on file  Food Insecurity: No Food Insecurity (12/25/2022)   Hunger Vital Sign    Worried About Running Out of Food in the Last Year: Never true    Ran Out of Food in the Last Year: Never true  Transportation Needs: No Transportation Needs (12/25/2022)   PRAPARE - Administrator, Civil Service (Medical): No    Lack of Transportation (Non-Medical): No  Physical Activity: Not on file  Stress: Not on file  Social Connections: Not on file  Intimate Partner Violence: Not At Risk (12/25/2022)   Humiliation, Afraid, Rape, and Kick questionnaire    Fear of Current or Ex-Partner: No    Emotionally Abused: No    Physically Abused: No    Sexually Abused: No    Family History:   The patient's family history includes Arrhythmia in her father; Bladder Cancer in her brother; CAD in her father; Cancer in her father; Heart disease in her father; Hypertension in her mother; Lung cancer in her brother; Stroke in her mother. There is no history of Colon cancer, Esophageal cancer, Stomach cancer, or Colon polyps.  ROS:  Please see the history of present illness.  ROS  All other ROS reviewed and negative.     Physical Exam/Data:   Vitals:   01/01/23 1245 01/01/23 1300 01/01/23 1315 01/01/23 1330  BP: (!) 145/50 (!) 145/47 (!) 141/47 (!) 138/43  Pulse: 67 64 66 67  Resp: (!) 28 (!) 23 (!) 26 (!) 25  Temp:      TempSrc:      SpO2: 100% 100% 100% 100%  Weight:      Height:        Intake/Output Summary (Last 24 hours) at 01/01/2023 1504 Last data filed at 01/01/2023 1251 Gross per 24 hour  Intake 1020 ml  Output 900 ml  Net 120 ml   Filed Weights   12/29/22 0500 12/30/22 0713 12/31/22 0500  Weight: 59.2 kg 59.2 kg 61.5 kg   Body mass index is 24.02 kg/m.   General: Well developed, well nourished, NAD Lungs: Bilateral upper  and low rhonchi. No wheezes. Breathing is unlabored. Cardiovascular: RRR with S1 S2. + murmur Extremities: No edema. Neuro: Alert and oriented. No focal deficits. No facial asymmetry. MAE spontaneously. Psych: Responds to questions appropriately with normal affect.    Telemetry:  Telemetry was personally reviewed and demonstrates: NSR with intermittent atrial fibrillation  Relevant CV Studies:  Echocardiogram 12/25/2022:   1. Left ventricular ejection  fraction, by estimation, is 65 to 70%. The  left ventricle has normal function. The left ventricle has no regional  wall motion abnormalities. There is moderate asymmetric left ventricular  hypertrophy of the basal-septal  segment. Left ventricular diastolic parameters are consistent with Grade  III diastolic dysfunction (restrictive).   2. Right ventricular systolic function is normal. The right ventricular  size is normal. There is moderately elevated pulmonary artery systolic  pressure. The estimated right ventricular systolic pressure is 59.3 mmHg.   3. Left atrial size was severely dilated.   4. Right atrial size was moderately dilated.   5. The mitral valve is thickened with mildly restricted leaflet motion.  The left atrium is severely dialted with dialtion of the MV annulus. There  is severe central MR. The mitral valve is abnormal. Severe mitral valve  regurgitation. No evidence of  mitral stenosis.   6. Tricuspid valve regurgitation is moderate.   7. The aortic valve is tricuspid. Aortic valve regurgitation is mild to  moderate. No aortic stenosis is present. Aortic valve area, by VTI  measures 2.45 cm. Aortic valve mean gradient measures 9.0 mmHg. Aortic  valve Vmax measures 2.03 m/s.   8. The inferior vena cava is dilated in size with <50% respiratory  variability, suggesting right atrial pressure of 15 mmHg.   Conclusion(s)/Recommendation(s): Consider TEE to further evalaute valvular  disease if clinically indicated.     TEE 01/01/2023:    1. Left ventricular ejection fraction, by estimation, is 65 to 70%. The  left ventricle has normal function.   2. Right ventricular systolic function is normal. The right ventricular  size is normal. There is severely elevated pulmonary artery systolic  pressure. The estimated right ventricular systolic pressure is 60.7 mmHg.   3. Left atrial size was severely dilated. No left atrial/left atrial  appendage thrombus was detected.   4. Right atrial size was moderately dilated.   5. A small pericardial effusion is present. The pericardial effusion is  circumferential.   6. Flail P3 leaflet with possible cleft between P2-P3. The mitral valve  is abnormal. Severe mitral valve regurgitation.   7. Tricuspid valve regurgitation is moderate.   8. The aortic valve is tricuspid. Aortic valve regurgitation is severe.  No aortic stenosis is present.   9. There is Moderate (Grade III) plaque involving the descending aorta.  10. Severely dilated pulmonary artery.   Conclusion(s)/Recommendation(s): Severe MR with flail P3 leaflet and  possible cleft between P2-P3. Severe aortic regurgitation. Severely  dilated PA with severely elevated PA pressure.   Laboratory Data:  Chemistry Recent Labs  Lab 12/29/22 0339 12/30/22 0521 01/01/23 0502  NA 134* 137 139  K 3.6 3.8 3.7  CL 102 103 104  CO2 24 25 23   GLUCOSE 103* 100* 97  BUN 21 23 22   CREATININE 0.67 0.73 0.71  CALCIUM 8.7* 8.9 8.9  GFRNONAA >60 >60 >60  ANIONGAP 8 9 12     No results for input(s): "PROT", "ALBUMIN", "AST", "ALT", "ALKPHOS", "BILITOT" in the last 168 hours. Hematology Recent Labs  Lab 12/29/22 0339 12/30/22 0521 01/01/23 0502  WBC 7.9 7.8 7.1  RBC 2.82* 2.76* 2.61*  HGB 8.3* 8.2* 7.8*  HCT 26.9* 26.5* 25.7*  MCV 95.4 96.0 98.5  MCH 29.4 29.7 29.9  MCHC 30.9 30.9 30.4  RDW 19.2* 19.3* 19.3*  PLT 151 151 142*   Cardiac EnzymesNo results for input(s): "TROPONINI" in the last 168 hours. No  results for input(s): "TROPIPOC" in the last 168 hours.  BNPNo results for input(s): "BNP", "PROBNP" in the last 168 hours.  DDimer No results for input(s): "DDIMER" in the last 168 hours.  Radiology/Studies:  EP STUDY  Result Date: 14-Jan-2023 See surgical note for result.   STS Risk Calculator: Procedure Type: Isolated MVR Perioperative Outcome Estimate % Operative Mortality 3.48% Morbidity & Mortality 21.4% Stroke 4.22% Renal Failure 3.06% Reoperation 5.77% Prolonged Ventilation 12.8% Deep Sternal Wound Infection 0.06% Long Hospital Stay (>14 days) 20.4% Short Hospital Stay (<6 days)* 9.54%  Procedure Type: Isolated MVrepair Perioperative Outcome Estimate % Operative Mortality 6.04% Morbidity & Mortality 15.8% Stroke 0.007% Renal Failure 1.47% Reoperation 4.64% Prolonged Ventilation 8.34% Deep Sternal Wound Infection 0.061% Long Hospital Stay (>14 days) 26.8% Short Hospital Stay (<6 days)* 28.9%  [facility] Cardiomyopathy Questionnaire     01/02/2023    1:38 PM  KCCQ-12  1 a. Ability to shower/bathe Quite a bit limited  1 b. Ability to walk 1 block Extremely limited  1 c. Ability to hurry/jog Extremely limited  2. Edema feet/ankles/legs 3+ times a week, not every day  3. Limited by fatigue Several times a day  4. Limited by dyspnea All of the time  5. Sitting up / on 3+ pillows Every night  6. Limited enjoyment of life Extremely limited  7. Rest of life w/ symptoms Not at all satisfied  8 a. Participation in hobbies Severely limited  8 b. Participation in chores Severely limited  8 c. Visiting family/friends Limited quite a bit     Assessment and Plan:   Dana Horton is a 77 y.o. female with symptoms of severe, stage D mitral regurgitation with NYHA Class III symptoms. I have reviewed the patient's recent echocardiogram which is notable for normal LVEF at 65-70% with GIII DD with evidence of severe MR and moderate TR. Subsequent TEE January 14, 2023  confirmed flail P3 leaflet with possible cleft between P2-P3 with severe MR, moderate TR, and severe AR. Also noted to have moderate grade III plaquing of the aorta and severely dilated pulmonary artery. R/LHC is scheduled for today 01/02/2023 to rule out CAD and assess RH hemodynamics.  Dr. Excell Seltzer and I reviewed the natural history of mitral regurgitation with the patient. We have discussed the limitations of medical therapy and the poor prognosis associated with symptomatic mitral regurgitation. We have also reviewed potential treatment options, including palliative medical therapy, conventional surgical mitral valve repair or replacement, and percutaneous mitral valve repair with MitraClip. We discussed treatment options in the context of this patient's specific comorbid medical conditions.    The patient's predicted risk of mortality with conventional mitral valve replacement/repair is 3.48 % / 6.04 % respectively, primarily based on advanced age with a long hx of tobacco use, COPD, recent weight loss concerning for possible malignancy, iron deficiency anemia, and severe AI/TR. Other significant comorbid conditions uncontrolled atrial fibrillation.   Plan for surgical consultation while inpatient however, likely not a candidate given the above co-morbidities. Will need to r/o malignancy given recent unintentional weight loss with long hx of tobacco use. Chest CT has been ordered. If imaging and cath are free from precluding factors, plan for hospital discharge after medically stable and mTEER on next procedure day (10/23). She is scheduled to see Dr. Excell Seltzer next week for close follow up.   MD to follow with final recommendations.    Signed, Georgie Chard, NP  01/14/23 3:04 PM   Patient seen, examined. Available data reviewed. Agree with findings, assessment, and plan as outlined by Georgie Chard, NP.  The patient is an elderly woman in NAD. HEENT is normal, JVP normal, lungs with diffuse rhonchi  bilaterally, CV: RRR with a 2/6 holosystolic murmur along the LLSB and apex, abdomen soft, NT. Extremities have no edema, skin is W/D no rash, neurologically intact with 5/5 strength in the arms and legs bilaterally. Tele review shows sinus rhythm with periods of AF with RVR. Echo and TEE studies reviewed. She has normal LV systolic function, moderately severe AI, severe MR with a flail P3 and possible cleft of the posterior leaflet, and at least moderate TR. RV function is normal. Cardiac cath showed normal coronary arteries, giant V waves c/w severe MR, moderate pulmonary HTN, and normal RA pressure. The patient has been seen by cardiac surgery and is not a candidate for surgical valve repair/replacement due to her functional status and comorbid conditions. She reports 3 months of progressive dyspnea, fatigue, and weight loss. I suspect her severe MR and symptoms of heart failure are a major component of the patient's progressive decline. With her heavy smoking history, it would be appropriate to do a non-contrast CT of the chest to evaluate for malignancy as well. The patient is counseled about the natural history of severe mitral regurgitation, as well as potential treatment options. It appears her only options are mitral TEER or palliative medical therapy. She would like to pursue any treatment that could improve her quality of life. Her valve anatomy is complex and it may be difficult to achieve mild or less MR, but I think with a successful procedure, her MR could be reduced and symptoms improved. The patient is counseled specifically about the risks, indications, and alternatives to percutaneous mitral valve repair with MitraClip. Specific risks include vascular injury, bleeding, infection, arrhythmia, myocardial infarction, stroke, cardiac perforation, cardiac tamponade, device embolization, single leaflet detachment, endocarditis, mitral valve injury, emergency surgery, and death. They understand the risk  of serious complication occurs at a rate of approximately 2-3%.  Will await her CT result, and see her back closely in clinical follow-up with plans to arrange transcatheter edge to edge repair of her mitral valve at the next available time.   Tonny Bollman, M.D. 01/03/2023 6:54 AM

## 2023-01-01 NOTE — Progress Notes (Signed)
Progress Note  Patient Name: Dana Horton Date of Encounter: 01/01/2023 Primary Cardiologist: Norman Herrlich, MD   Subjective   Callled for asymptomatic bradycardia overnight Decrease BB. Patient feels better.  She notes that she started smoking at the age of 23 and stopped during admission. She notes her bladder cancer is well controlled  Vital Signs    Vitals:   01/01/23 0755 01/01/23 0756 01/01/23 0901 01/01/23 1006  BP:   (!) 115/43 (!) 120/45  Pulse:   67 68  Resp:    (!) 26  Temp:    (!) 97.2 F (36.2 C)  TempSrc:    Temporal  SpO2: (!) 86% 97% 97% 96%  Weight:      Height:        Intake/Output Summary (Last 24 hours) at 01/01/2023 1011 Last data filed at 12/31/2022 2132 Gross per 24 hour  Intake 900 ml  Output --  Net 900 ml   Filed Weights   12/29/22 0500 12/30/22 0713 12/31/22 0500  Weight: 59.2 kg 59.2 kg 61.5 kg    Physical Exam   GEN: No acute distress. Neck: No JVD  Cardiac: RRR, no rubs, or gallops. Systolic and diastolic murmur Respiratory: Clear to auscultation bilaterally. GI: Soft, nontender, non-distended  MS: No edema  Labs   Telemetry: PAF with SR   Chemistry Recent Labs  Lab 12/29/22 0339 12/30/22 0521 01/01/23 0502  NA 134* 137 139  K 3.6 3.8 3.7  CL 102 103 104  CO2 24 25 23   GLUCOSE 103* 100* 97  BUN 21 23 22   CREATININE 0.67 0.73 0.71  CALCIUM 8.7* 8.9 8.9  GFRNONAA >60 >60 >60  ANIONGAP 8 9 12      Hematology Recent Labs  Lab 12/29/22 0339 12/30/22 0521 01/01/23 0502  WBC 7.9 7.8 7.1  RBC 2.82* 2.76* 2.61*  HGB 8.3* 8.2* 7.8*  HCT 26.9* 26.5* 25.7*  MCV 95.4 96.0 98.5  MCH 29.4 29.7 29.9  MCHC 30.9 30.9 30.4  RDW 19.2* 19.3* 19.3*  PLT 151 151 142*    Cardiac EnzymesNo results for input(s): "TROPONINI" in the last 168 hours. No results for input(s): "TROPIPOC" in the last 168 hours.   BNPNo results for input(s): "BNP", "PROBNP" in the last 168 hours.   DDimer No results for input(s):  "DDIMER" in the last 168 hours.   Cardiac Studies   Cardiac Studies & Procedures   CARDIAC CATHETERIZATION  CARDIAC CATHETERIZATION 04/09/2017  Narrative  The left ventricular systolic function is normal.  LV end diastolic pressure is normal. LVEDP 16 mm Hg.  The left ventricular ejection fraction is greater than 65% by visual estimate.  There is no aortic valve stenosis.  No angiographically apparent CAD.  Continue medical therapy.  Restart heparin gtt 8 hours after sheath pull for AFib/stroke prevention.  Findings Coronary Findings Diagnostic  Dominance: Right  Left Main Vessel was injected. Vessel is normal in caliber. Vessel is angiographically normal.  Left Anterior Descending Vessel was injected. Vessel is normal in caliber. Vessel is angiographically normal.  Left Circumflex Vessel was injected. Vessel is normal in caliber. Vessel is angiographically normal.  Right Coronary Artery Vessel was injected. Vessel is normal in caliber. Vessel is angiographically normal.  Intervention  No interventions have been documented.   CARDIAC CATHETERIZATION  CARDIAC CATHETERIZATION 10/17/2015     ECHOCARDIOGRAM  ECHOCARDIOGRAM COMPLETE 12/25/2022  Narrative ECHOCARDIOGRAM REPORT    Patient Name:   Dana Horton Date of Exam: 12/25/2022 Medical Rec #:  [id number]  Height:       63.0 in Accession #:    [id number]                    Weight:       130.1 lb Date of Birth:  1946/03/19                    BSA:          1.611 m Patient Age:    77 years                      BP:           102/33 mmHg Patient Gender: F                             HR:           72 bpm. Exam Location:  Inpatient  Procedure: 2D Echo, Cardiac Doppler and Color Doppler  Indications:    CHF  History:        Patient has no prior history of Echocardiogram examinations. CHF, COPD, Arrythmias:Atrial Fibrillation; Risk Factors:Hypertension and  Dyslipidemia.  Sonographer:    Melton Krebs RDCS, FE, PE Referring Phys: [id number] JAN A MANSY  IMPRESSIONS   1. Left ventricular ejection fraction, by estimation, is 65 to 70%. The left ventricle has normal function. The left ventricle has no regional wall motion abnormalities. There is moderate asymmetric left ventricular hypertrophy of the basal-septal segment. Left ventricular diastolic parameters are consistent with Grade III diastolic dysfunction (restrictive). 2. Right ventricular systolic function is normal. The right ventricular size is normal. There is moderately elevated pulmonary artery systolic pressure. The estimated right ventricular systolic pressure is 59.3 mmHg. 3. Left atrial size was severely dilated. 4. Right atrial size was moderately dilated. 5. The mitral valve is thickened with mildly restricted leaflet motion. The left atrium is severely dialted with dialtion of the MV annulus. There is severe central MR. The mitral valve is abnormal. Severe mitral valve regurgitation. No evidence of mitral stenosis. 6. Tricuspid valve regurgitation is moderate. 7. The aortic valve is tricuspid. Aortic valve regurgitation is mild to moderate. No aortic stenosis is present. Aortic valve area, by VTI measures 2.45 cm. Aortic valve mean gradient measures 9.0 mmHg. Aortic valve Vmax measures 2.03 m/s. 8. The inferior vena cava is dilated in size with <50% respiratory variability, suggesting right atrial pressure of 15 mmHg.  Conclusion(s)/Recommendation(s): Consider TEE to further evalaute valvular disease if clinically indicated.  FINDINGS Left Ventricle: Left ventricular ejection fraction, by estimation, is 65 to 70%. The left ventricle has normal function. The left ventricle has no regional wall motion abnormalities. The left ventricular internal cavity size was normal in size. There is moderate asymmetric left ventricular hypertrophy of the basal-septal segment. Left ventricular  diastolic parameters are consistent with Grade III diastolic dysfunction (restrictive).  Right Ventricle: The right ventricular size is normal. No increase in right ventricular wall thickness. Right ventricular systolic function is normal. There is moderately elevated pulmonary artery systolic pressure. The tricuspid regurgitant velocity is 3.51 m/s, and with an assumed right atrial pressure of 10 mmHg, the estimated right ventricular systolic pressure is 59.3 mmHg.  Left Atrium: Left atrial size was severely dilated.  Right Atrium: Right atrial size was moderately dilated.  Pericardium: There is no evidence of pericardial effusion.  Mitral Valve: The mitral valve is thickened with mildly restricted leaflet motion.  The left atrium is severely dialted with dialtion of the MV annulus. There is severe central MR. The mitral valve is abnormal. There is mild thickening of the mitral valve leaflet(s). Mild to moderate mitral annular calcification. Severe mitral valve regurgitation. No evidence of mitral valve stenosis.  Tricuspid Valve: The tricuspid valve is normal in structure. Tricuspid valve regurgitation is moderate . No evidence of tricuspid stenosis.  Aortic Valve: The aortic valve is tricuspid. Aortic valve regurgitation is mild to moderate. Aortic regurgitation PHT measures 270 msec. No aortic stenosis is present. Aortic valve mean gradient measures 9.0 mmHg. Aortic valve peak gradient measures 16.5 mmHg. Aortic valve area, by VTI measures 2.45 cm.  Pulmonic Valve: The pulmonic valve was normal in structure. Pulmonic valve regurgitation is not visualized. No evidence of pulmonic stenosis.  Aorta: The aortic root is normal in size and structure.  Venous: The inferior vena cava is dilated in size with less than 50% respiratory variability, suggesting right atrial pressure of 15 mmHg.  IAS/Shunts: No atrial level shunt detected by color flow Doppler.   LEFT VENTRICLE PLAX 2D LVIDd:          4.00 cm   Diastology LVIDs:         2.50 cm   LV e' medial:    5.77 cm/s LV PW:         1.10 cm   LV E/e' medial:  29.8 LV IVS:        1.50 cm   LV e' lateral:   6.42 cm/s LVOT diam:     1.90 cm   LV E/e' lateral: 26.8 LV SV:         104 LV SV Index:   65 LVOT Area:     2.84 cm   RIGHT VENTRICLE RV S prime:     12.90 cm/s TAPSE (M-mode): 1.7 cm  LEFT ATRIUM             Index        RIGHT ATRIUM           Index LA diam:        4.30 cm 2.67 cm/m   RA Area:     20.40 cm LA Vol (A2C):   96.4 ml 59.86 ml/m  RA Volume:   56.70 ml  35.21 ml/m LA Vol (A4C):   85.3 ml 52.96 ml/m LA Biplane Vol: 91.9 ml 57.06 ml/m AORTIC VALVE AV Area (Vmax):    2.54 cm AV Area (Vmean):   2.30 cm AV Area (VTI):     2.45 cm AV Vmax:           203.00 cm/s AV Vmean:          143.000 cm/s AV VTI:            0.426 m AV Peak Grad:      16.5 mmHg AV Mean Grad:      9.0 mmHg LVOT Vmax:         182.00 cm/s LVOT Vmean:        116.000 cm/s LVOT VTI:          0.368 m LVOT/AV VTI ratio: 0.86 AI PHT:            270 msec  AORTA Ao Root diam: 2.60 cm  MITRAL VALVE                TRICUSPID VALVE MV Area (PHT): 2.79 cm     TR Peak grad:   49.3 mmHg MV  Decel Time: 272 msec     TR Vmax:        351.00 cm/s MV E velocity: 172.00 cm/s MV A velocity: 49.90 cm/s   SHUNTS MV E/A ratio:  3.45         Systemic VTI:  0.37 m Systemic Diam: 1.90 cm  Arvilla Meres MD Electronically signed by Arvilla Meres MD Signature Date/Time: 12/25/2022/10:16:24 AM    Final    MONITORS  LONG TERM MONITOR-LIVE TELEMETRY (3-14 DAYS) 08/28/2022  Narrative Patch Wear Time:  12 days and 19 hours (2024-05-01T15:53:56-0400 to 2024-05-14T11:31:55-0400)  Patient had a min HR of 55 bpm, max HR of 182 bpm, and avg HR of 73 bpm. Predominant underlying rhythm was Sinus Rhythm.  There were no triggered or diary events.  There were no pauses of 3 seconds or greater and no episodes of second or third-degree AV nodal  block.  10 Supraventricular Tachycardia runs occurred, the run with the fastest interval lasting 8 beats with a max rate of 182 bpm, the longest lasting 11 beats with an avg rate of 162 bpm.  Atrial Fibrillation occurred (<1% burden), ranging from 105-140 bpm (avg of 118 bpm), the longest lasting 1 min 7 secs with an avg rate of 118 bpm.  Isolated SVEs were rare (<1.0%), SVE Couplets were rare (<1.0%), and SVE Triplets were rare (<1.0%).  Isolated VEs were rare (<1.0%), VE Couplets were rare (<1.0%), and no VE Triplets were present. Ventricular Bigeminy was present. MD notification criteria for First Documentation of Atrial Fibrillation  met - notified Richard C. (RN) on 27 Aug 2022 at 1:15 PM CDT (DR).             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 Noted basal septal hypertrophy and severe left atrial enlargement. - will restart lasix 40 mg PO daily - will add SGLT2i post procedures - reviewed images with patient, she notes being reasonably function; I discussed LHC/RHC  - I will have surgery see her, I suspect she will be turned down - I do not think she is a [place] Valve candidate; she would be a reasonable candidate for mTEER; she notes her shortness of breath over the past month has significantly decreased her quality of life. - have reached out to call her daughter, Britta Mccreedy, who also works at [employer]  Risks and benefits of cardiac catheterization have been discussed with the patient.  These include bleeding, infection, kidney damage, stroke, heart attack, death.  The patient understands these risks and is willing to proceed.  Access recommendations: R radial, R AC Procedural considerations Last DOAC dose 01/01/23    Paroxsymal Atrial Fibrillation - CHASDVASC 4-5 Managed on amiodarone, Eliquis, and metoprolol. -Continue  amiodarone 400mg  PO BID for one week, then 200mg  PO daily. -Transition eliquis to heparin for LHC RHC -Continue metoprolol 12.5mg  PO BID   Heart Failure with Preserved Ejection Fraction (HFpEF) - Likely increased left atrial pressure. Diuretic was held due to hypotension. -Attempt to start baseline diuretic as above -Consider starting SGLT2 inhibitor if patient is amenable (post RHC).   COPD and Tobacco Abuse - Weaned to room air only. -Advise to remain tobacco-free.   Hyperlipidemia, Aortic Atherosclerosis, and Peripheral Arterial Disease Evidenced by femoral calcifications. -Continue statin therapy.    For questions or updates, please contact [facility] Please consult [email or web address] for contact info under Cardiology/STEMI.  Riley Lam, MD FASE [facility] Cardiologist [facility]  [address] [place], [address] [place], [place] [address] [phone]  10:11 AM

## 2023-01-01 NOTE — Anesthesia Preprocedure Evaluation (Signed)
Anesthesia Evaluation  Patient identified by MRN, date of birth, ID band Patient awake    Reviewed: Allergy & Precautions, NPO status , Patient's Chart, lab work & pertinent test results  Airway Mallampati: II  TM Distance: >3 FB Neck ROM: Full    Dental  (+) Dental Advisory Given, Edentulous Upper, Edentulous Lower   Pulmonary sleep apnea and Continuous Positive Airway Pressure Ventilation , COPD, Current Smoker and Patient abstained from smoking.   Pulmonary exam normal breath sounds clear to auscultation       Cardiovascular hypertension, + Past MI and +CHF  + dysrhythmias Atrial Fibrillation + Valvular Problems/Murmurs MR  Rhythm:Regular Rate:Normal + Systolic murmurs    Neuro/Psych negative neurological ROS     GI/Hepatic Neg liver ROS,GERD  ,,  Endo/Other  negative endocrine ROS    Renal/GU Renal InsufficiencyRenal disease   Bladder ca    Musculoskeletal  (+) Arthritis ,    Abdominal   Peds  Hematology  (+) Blood dyscrasia (Thrombocytopenia), anemia   Anesthesia Other Findings Day of surgery medications reviewed with the patient.  Reproductive/Obstetrics                             Anesthesia Physical Anesthesia Plan  ASA: 4  Anesthesia Plan: MAC   Post-op Pain Management: Minimal or no pain anticipated   Induction: Intravenous  PONV Risk Score and Plan: 1 and TIVA and Treatment may vary due to age or medical condition  Airway Management Planned: Natural Airway and Simple Face Mask  Additional Equipment:   Intra-op Plan:   Post-operative Plan:   Informed Consent: I have reviewed the patients History and Physical, chart, labs and discussed the procedure including the risks, benefits and alternatives for the proposed anesthesia with the patient or authorized representative who has indicated his/her understanding and acceptance.   Patient has DNR.  Discussed DNR with  patient and Suspend DNR.   Dental advisory given  Plan Discussed with: CRNA  Anesthesia Plan Comments:        Anesthesia Quick Evaluation

## 2023-01-02 ENCOUNTER — Encounter (HOSPITAL_COMMUNITY): Payer: Self-pay | Admitting: Cardiology

## 2023-01-02 ENCOUNTER — Encounter (HOSPITAL_COMMUNITY)
Admission: EM | Disposition: A | Discharge status: Home / Self Care | Payer: Self-pay | Source: Home / Self Care | Attending: Internal Medicine

## 2023-01-02 DIAGNOSIS — I10 Essential (primary) hypertension: Secondary | ICD-10-CM

## 2023-01-02 DIAGNOSIS — G4733 Obstructive sleep apnea (adult) (pediatric): Secondary | ICD-10-CM

## 2023-01-02 DIAGNOSIS — I351 Nonrheumatic aortic (valve) insufficiency: Secondary | ICD-10-CM | POA: Diagnosis not present

## 2023-01-02 DIAGNOSIS — I340 Nonrheumatic mitral (valve) insufficiency: Secondary | ICD-10-CM | POA: Diagnosis not present

## 2023-01-02 DIAGNOSIS — D649 Anemia, unspecified: Secondary | ICD-10-CM

## 2023-01-02 DIAGNOSIS — N1831 Chronic kidney disease, stage 3a: Secondary | ICD-10-CM | POA: Diagnosis not present

## 2023-01-02 DIAGNOSIS — I5033 Acute on chronic diastolic (congestive) heart failure: Secondary | ICD-10-CM | POA: Diagnosis not present

## 2023-01-02 DIAGNOSIS — I480 Paroxysmal atrial fibrillation: Secondary | ICD-10-CM | POA: Diagnosis not present

## 2023-01-02 HISTORY — PX: RIGHT/LEFT HEART CATH AND CORONARY ANGIOGRAPHY: CATH118266

## 2023-01-02 LAB — POCT I-STAT 7, (LYTES, BLD GAS, ICA,H+H)
Acid-Base Excess: 1 mmol/L (ref 0.0–2.0)
Acid-Base Excess: 3 mmol/L — ABNORMAL HIGH (ref 0.0–2.0)
Bicarbonate: 24.2 mmol/L (ref 20.0–28.0)
Bicarbonate: 27.7 mmol/L (ref 20.0–28.0)
Calcium, Ion: 1.18 mmol/L (ref 1.15–1.40)
Calcium, Ion: 1.19 mmol/L (ref 1.15–1.40)
HCT: 24 % — ABNORMAL LOW (ref 36.0–46.0)
HCT: 25 % — ABNORMAL LOW (ref 36.0–46.0)
Hemoglobin: 8.2 g/dL — ABNORMAL LOW (ref 12.0–15.0)
Hemoglobin: 8.5 g/dL — ABNORMAL LOW (ref 12.0–15.0)
O2 Saturation: 59 %
O2 Saturation: 95 %
Potassium: 4.3 mmol/L (ref 3.5–5.1)
Potassium: 4.3 mmol/L (ref 3.5–5.1)
Sodium: 141 mmol/L (ref 135–145)
Sodium: 143 mmol/L (ref 135–145)
TCO2: 25 mmol/L (ref 22–32)
TCO2: 29 mmol/L (ref 22–32)
pCO2 arterial: 33.9 mm[Hg] (ref 32–48)
pCO2 arterial: 43.4 mm[Hg] (ref 32–48)
pH, Arterial: 7.413 (ref 7.35–7.45)
pH, Arterial: 7.462 — ABNORMAL HIGH (ref 7.35–7.45)
pO2, Arterial: 30 mm[Hg] — CL (ref 83–108)
pO2, Arterial: 70 mm[Hg] — ABNORMAL LOW (ref 83–108)

## 2023-01-02 LAB — BASIC METABOLIC PANEL
Anion gap: 13 (ref 5–15)
BUN: 24 mg/dL — ABNORMAL HIGH (ref 8–23)
CO2: 26 mmol/L (ref 22–32)
Calcium: 8.8 mg/dL — ABNORMAL LOW (ref 8.9–10.3)
Chloride: 101 mmol/L (ref 98–111)
Creatinine, Ser: 0.93 mg/dL (ref 0.44–1.00)
GFR, Estimated: >60 mL/min (ref >60)
Glucose, Bld: 119 mg/dL — ABNORMAL HIGH (ref 70–99)
Potassium: 3.3 mmol/L — ABNORMAL LOW (ref 3.5–5.1)
Sodium: 140 mmol/L (ref 135–145)

## 2023-01-02 LAB — CBC
HCT: 28.2 % — ABNORMAL LOW (ref 36.0–46.0)
Hemoglobin: 8.7 g/dL — ABNORMAL LOW (ref 12.0–15.0)
MCH: 29.2 pg (ref 26.0–34.0)
MCHC: 30.9 g/dL (ref 30.0–36.0)
MCV: 94.6 fL (ref 80.0–100.0)
Platelets: 172 10*3/uL (ref 150–400)
RBC: 2.98 MIL/uL — ABNORMAL LOW (ref 3.87–5.11)
RDW: 19 % — ABNORMAL HIGH (ref 11.5–15.5)
WBC: 7.6 10*3/uL (ref 4.0–10.5)
nRBC: 0 % (ref 0.0–0.2)

## 2023-01-02 LAB — APTT: aPTT: 53 s — ABNORMAL HIGH (ref 24–36)

## 2023-01-02 SURGERY — RIGHT/LEFT HEART CATH AND CORONARY ANGIOGRAPHY
Anesthesia: LOCAL

## 2023-01-02 MED ORDER — MIDAZOLAM HCL 2 MG/2ML IJ SOLN
INTRAMUSCULAR | Status: DC | PRN
  Administered 2023-01-02: 1 mg via INTRAVENOUS

## 2023-01-02 MED ORDER — AMIODARONE HCL 200 MG PO TABS: 200.0000 mg | ORAL_TABLET | Freq: Every day | ORAL | Status: DC

## 2023-01-02 MED ORDER — VERAPAMIL HCL 2.5 MG/ML IV SOLN
INTRAVENOUS | Status: AC
  Filled 2023-01-02: qty 2

## 2023-01-02 MED ORDER — EMPAGLIFLOZIN 10 MG PO TABS
10.0000 mg | ORAL_TABLET | Freq: Every day | ORAL | Status: DC
  Administered 2023-01-02: 10 mg via ORAL
  Filled 2023-01-02: qty 1

## 2023-01-02 MED ORDER — HEPARIN (PORCINE) IN NACL 1000-0.9 UT/500ML-% IV SOLN
INTRAVENOUS | Status: DC | PRN
  Administered 2023-01-02 (×2): 500 mL

## 2023-01-02 MED ORDER — POTASSIUM CHLORIDE CRYS ER 20 MEQ PO TBCR
40.0000 meq | EXTENDED_RELEASE_TABLET | ORAL | Status: AC
  Administered 2023-01-02 (×2): 40 meq via ORAL
  Filled 2023-01-02 (×2): qty 2

## 2023-01-02 MED ORDER — SODIUM CHLORIDE 0.9 % IV SOLN: INTRAVENOUS | Status: DC

## 2023-01-02 MED ORDER — IOHEXOL 350 MG/ML SOLN
INTRAVENOUS | Status: DC | PRN
  Administered 2023-01-02: 60 mL

## 2023-01-02 MED ORDER — SODIUM CHLORIDE 0.9% FLUSH
3.0000 mL | Freq: Two times a day (BID) | INTRAVENOUS | Status: DC
  Administered 2023-01-02: 3 mL via INTRAVENOUS

## 2023-01-02 MED ORDER — HEPARIN SODIUM (PORCINE) 1000 UNIT/ML IJ SOLN
INTRAMUSCULAR | Status: AC
  Filled 2023-01-02: qty 10

## 2023-01-02 MED ORDER — FUROSEMIDE 40 MG PO TABS
40.0000 mg | ORAL_TABLET | Freq: Two times a day (BID) | ORAL | Status: DC
  Administered 2023-01-02: 40 mg via ORAL
  Filled 2023-01-02: qty 1

## 2023-01-02 MED ORDER — ASPIRIN 81 MG PO CHEW
81.0000 mg | CHEWABLE_TABLET | ORAL | Status: AC
  Administered 2023-01-02: 81 mg via ORAL
  Filled 2023-01-02: qty 1

## 2023-01-02 MED ORDER — VERAPAMIL HCL 2.5 MG/ML IV SOLN
INTRAVENOUS | Status: DC | PRN
  Administered 2023-01-02: 10 mL via INTRA_ARTERIAL

## 2023-01-02 MED ORDER — LIDOCAINE HCL (PF) 1 % IJ SOLN
INTRAMUSCULAR | Status: AC
  Filled 2023-01-02: qty 30

## 2023-01-02 MED ORDER — MIDAZOLAM HCL 2 MG/2ML IJ SOLN
INTRAMUSCULAR | Status: AC
  Filled 2023-01-02: qty 2

## 2023-01-02 MED ORDER — SPIRONOLACTONE 12.5 MG HALF TABLET
12.5000 mg | ORAL_TABLET | Freq: Every day | ORAL | Status: DC
  Administered 2023-01-02: 12.5 mg via ORAL
  Filled 2023-01-02: qty 1

## 2023-01-02 MED ORDER — SODIUM CHLORIDE 0.9% FLUSH: 3.0000 mL | INTRAVENOUS | Status: DC | PRN

## 2023-01-02 MED ORDER — SODIUM CHLORIDE 0.9 % IV SOLN: 250.0000 mL | INTRAVENOUS | Status: DC | PRN

## 2023-01-02 MED ORDER — LIDOCAINE HCL (PF) 1 % IJ SOLN
INTRAMUSCULAR | Status: DC | PRN
  Administered 2023-01-02 (×2): 2 mL
  Administered 2023-01-02: 15 mL

## 2023-01-02 MED ORDER — PHENOL 1.4 % MT LIQD
1.0000 | OROMUCOSAL | Status: DC | PRN
  Administered 2023-01-02: 1 via OROMUCOSAL
  Filled 2023-01-02: qty 177

## 2023-01-02 SURGICAL SUPPLY — 22 items
BALLN EMERGE MR PUSH 1.5X15 (BALLOONS) ×1
BALLOON EMERGE MR PUSH 1.5X15 (BALLOONS) IMPLANT
CATH BALLN WEDGE 5F 110CM (CATHETERS) IMPLANT
CATH INFINITI 5FR JK (CATHETERS) IMPLANT
CATH INFINITI 5FR MULTPACK ANG (CATHETERS) IMPLANT
CATH LAUNCHER 5F JR4 (CATHETERS) IMPLANT
DEVICE CLOSURE MYNXGRIP 5F (Vascular Products) IMPLANT
DEVICE RAD TR BAND REGULAR (VASCULAR PRODUCTS) IMPLANT
GLIDESHEATH SLEND SS 6F .021 (SHEATH) IMPLANT
GUIDEWIRE INQWIRE 1.5J.035X260 (WIRE) IMPLANT
INQWIRE 1.5J .035X260CM (WIRE) ×1
KIT ENCORE 26 ADVANTAGE (KITS) IMPLANT
KIT HEART LEFT (KITS) IMPLANT
KIT MICROPUNCTURE NIT STIFF (SHEATH) IMPLANT
PACK CARDIAC CATHETERIZATION (CUSTOM PROCEDURE TRAY) ×2 IMPLANT
SHEATH GLIDE SLENDER 4/5FR (SHEATH) IMPLANT
SHEATH PINNACLE 5F 10CM (SHEATH) IMPLANT
SHEATH PROBE COVER 6X72 (BAG) IMPLANT
TRANSDUCER W/STOPCOCK (MISCELLANEOUS) IMPLANT
WIRE EMERALD 3MM-J .035X150CM (WIRE) IMPLANT
WIRE HI TORQ VERSACORE-J 145CM (WIRE) IMPLANT
WIRE RUNTHROUGH .014X180CM (WIRE) IMPLANT

## 2023-01-02 NOTE — Consult Note (Signed)
[address]       [place] [address]             [phone]           Garvin Fila Depner [facility] Medical Record #[id number] Date of Birth: [birth year]  No ref. 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 Pt currently awaiting left and right heart cath. Pt admitted with hypotension and increased DOE.      Past Medical History:  Diagnosis Date   Atrial fibrillation with rapid ventricular response (HCC) 04/10/2017   Bladder tumor 08/06/2022   BMI 28.0-28.9,adult 05/21/2022   Bronchitis 03/07/2022   Chronic anticoagulation 06/10/2019   Eliquis   Chronic diastolic heart failure (HCC)    Chronic obstructive pulmonary disease (HCC) 05/21/2022   Cigarette smoker 05/21/2022   CKD (chronic kidney disease) stage 3, GFR 30-59 ml/min (HCC)    Demand ischemia (HCC) 09/25/2016   Normal coronaries Jan 2019 when admitted with AF with RVR and chest pain   Dysrhythmia    a-fib   Essential hypertension    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 06/29/2022   GERD (gastroesophageal reflux disease)    HCAP (healthcare-associated pneumonia) 04/10/2017   History of bladder cancer    Hypercholesterolemia 05/21/2022   Iron deficiency anemia due to chronic blood loss 06/29/2022   Lung nodule    OSA on CPAP 04/08/2017   Osteopenia 05/21/2022   Osteoporosis    PAF (paroxysmal atrial fibrillation) (HCC) 09/24/2016   CHADS2 vasc=3   Sleep apnea    cpap  setting at 2.5 per patient    Stage 3a chronic kidney disease (HCC) 05/21/2022    Symptomatic anemia 08/07/2022   Tobacco abuse     Past Surgical History:  Procedure Laterality Date   ABDOMINAL HYSTERECTOMY     BLADDER SURGERY     COLONOSCOPY N/A 12/28/2016   Procedure: COLONOSCOPY;  Surgeon: Sherrilyn Rist, MD;  Location: [facility];  Service: Gastroenterology;  Laterality: N/A;   LEFT HEART CATH AND CORONARY ANGIOGRAPHY N/A 04/09/2017   Procedure: LEFT HEART CATH AND CORONARY ANGIOGRAPHY;  Surgeon: Corky Crafts, MD;  Location: [facility] INVASIVE CV LAB;  Service: Cardiovascular;  Laterality: N/A;   MOUTH SURGERY     POLYPECTOMY N/A 12/28/2016   Procedure: POLYPECTOMY with injection of Elevue;  Surgeon: Sherrilyn Rist, MD;  Location: [facility];  Service: Gastroenterology;  Laterality: N/A;   TEE WITHOUT CARDIOVERSION N/A 01/01/2023   Procedure: TRANSESOPHAGEAL ECHOCARDIOGRAM;  Surgeon: Jodelle Red, MD;  Location: [facility] INVASIVE CV LAB;  Service: Cardiovascular;  Laterality: N/A;   TRANSURETHRAL RESECTION OF BLADDER TUMOR N/A 08/09/2022   Procedure: TRANSURETHRAL RESECTION OF BLADDER TUMOR (TURBT) BILATERAL RETROGRADES;  Surgeon: Loletta Parish., MD;  Location: [facility];  Service: Urology;  Laterality: N/A;   TRANSURETHRAL RESECTION OF BLADDER TUMOR N/A 10/05/2022   Procedure: RESTAGING TRANSURETHRAL RESECTION OF BLADDER TUMOR (TURBT), CYSTOSCOPY, BILATERAL RETROGRADE PYELOGRAM;  Surgeon: Loletta Parish., MD;  Location: [facility];  Service: Urology;  Laterality: N/A;  1 HR  Social History   Tobacco Use  Smoking Status Every Day   Current packs/day: 0.50   Average packs/day: 0.5 packs/day for 52.0 years (26.0 ttl pk-yrs)   Types: Cigarettes  Smokeless Tobacco Never  Tobacco Comments   1/2 pack a day    Social History   Substance and Sexual Activity  Alcohol Use No    Social History   Socioeconomic History   Marital status: Married    Spouse name: Not on file   Number of children: 3   Years of education: Not on file   Highest  education level: Not on file  Occupational History   Occupation: Retired  Tobacco Use   Smoking status: Every Day    Current packs/day: 0.50    Average packs/day: 0.5 packs/day for 52.0 years (26.0 ttl pk-yrs)    Types: Cigarettes   Smokeless tobacco: Never   Tobacco comments:    1/2 pack a day  Vaping Use   Vaping status: Never Used  Substance and Sexual Activity   Alcohol use: No   Drug use: No   Sexual activity: Not Currently  Other Topics Concern   Not on file  Social History Narrative   Not on file   Social Determinants of Health   Financial Resource Strain: Not on file  Food Insecurity: No Food Insecurity (12/25/2022)   Hunger Vital Sign    Worried About Running Out of Food in the Last Year: Never true    Ran Out of Food in the Last Year: Never true  Transportation Needs: No Transportation Needs (12/25/2022)   PRAPARE - Administrator, Civil Service (Medical): No    Lack of Transportation (Non-Medical): No  Physical Activity: Not on file  Stress: Not on file  Social Connections: Not on file  Intimate Partner Violence: Not At Risk (12/25/2022)   Humiliation, Afraid, Rape, and Kick questionnaire    Fear of Current or Ex-Partner: No    Emotionally Abused: No    Physically Abused: No    Sexually Abused: No    Allergies  Allergen Reactions   Albuterol Palpitations   Lisinopril Cough    Current Facility-Administered Medications  Medication Dose Route Frequency Provider Last Rate Last Admin   acetaminophen (TYLENOL) tablet 650 mg  650 mg Oral Q6H PRN Mansy, Jan A, MD       Or   acetaminophen (TYLENOL) suppository 650 mg  650 mg Rectal Q6H PRN Mansy, Jan A, MD       [START ON 01/06/2023] amiodarone (PACERONE) tablet 200 mg  200 mg Oral Daily Arrien, York Ram, MD       amiodarone (PACERONE) tablet 400 mg  400 mg Oral BID Coralie Keens, MD   400 mg at 01/02/23 0848   budesonide (PULMICORT) nebulizer solution 0.5 mg  0.5 mg Nebulization BID  Jodelle Red, MD   0.5 mg at 01/02/23 0740   docusate sodium (COLACE) capsule 100 mg  100 mg Oral Daily Mansy, Jan A, MD   100 mg at 01/02/23 0850   ferrous sulfate tablet 325 mg  325 mg Oral Q breakfast Mansy, Jan A, MD   325 mg at 01/02/23 0850   fluticasone (FLONASE) 50 MCG/ACT nasal spray 2 spray  2 spray Each Nare Daily Mansy, Jan A, MD   2 spray at 01/02/23 1021   furosemide (LASIX) tablet 40 mg  40 mg Oral BID Chandrasekhar, Mahesh A, MD       heparin ADULT infusion 100 units/mL (25000  units/221mL)  1,100 Units/hr Intravenous Continuous Arrien, York Ram, MD 11 mL/hr at 01/02/23 1019 1,100 Units/hr at 01/02/23 1019   ipratropium-albuterol (DUONEB) 0.5-2.5 (3) MG/3ML nebulizer solution 3 mL  3 mL Nebulization Q4H PRN Noralee Stain, DO   3 mL at 01/01/23 1740   levalbuterol (XOPENEX) nebulizer solution 0.63 mg  0.63 mg Nebulization BID Mansy, Jan A, MD   0.63 mg at 01/02/23 0743   magnesium hydroxide (MILK OF MAGNESIA) suspension 30 mL  30 mL Oral Daily PRN Mansy, Jan A, MD       metoprolol tartrate (LOPRESSOR) tablet 12.5 mg  12.5 mg Oral BID Chandrasekhar, Mahesh A, MD   12.5 mg at 01/02/23 0848   montelukast (SINGULAIR) tablet 10 mg  10 mg Oral QPM Mansy, Jan A, MD   10 mg at 01/01/23 1928   ondansetron [facility]) tablet 4 mg  4 mg Oral Q6H PRN Mansy, Jan A, MD       Or   ondansetron [facility]) injection 4 mg  4 mg Intravenous Q6H PRN Mansy, Vernetta Honey, MD       Oral care mouth rinse  15 mL Mouth Rinse PRN Mansy, Vernetta Honey, MD       pantoprazole (PROTONIX) EC tablet 40 mg  40 mg Oral Daily Mansy, Jan A, MD   40 mg at 01/02/23 0849   phenol (CHLORASEPTIC) mouth spray 1 spray  1 spray Mouth/Throat PRN Arrien, York Ram, MD   1 spray at 01/02/23 1133   potassium chloride SA (KLOR-CON M) CR tablet 40 mEq  40 mEq Oral Q4H Arrien, York Ram, MD   40 mEq at 01/02/23 1020   pregabalin (LYRICA) capsule 50 mg  50 mg Oral BID Mansy, Jan A, MD   50 mg at 01/02/23 0849   rosuvastatin  (CRESTOR) tablet 5 mg  5 mg Oral QODAY Mansy, Jan A, MD   5 mg at 01/02/23 1610   spironolactone (ALDACTONE) tablet 12.5 mg  12.5 mg Oral Daily Chandrasekhar, Mahesh A, MD   12.5 mg at 01/02/23 1020   traZODone (DESYREL) tablet 25 mg  25 mg Oral QHS PRN Mansy, Jan A, MD   25 mg at 01/01/23 2132     Family History  Problem Relation Age of Onset   Hypertension Mother    Stroke Mother    Cancer Father    Heart disease Father    CAD Father    Arrhythmia Father    Bladder Cancer Brother    Lung cancer Brother    Colon cancer Neg Hx    Esophageal cancer Neg Hx    Stomach cancer Neg Hx    Colon polyps Neg Hx        Physical Exam: BP (!) 90/38 (BP Location: Left Arm)   Pulse 65   Temp 98.5 F (36.9 C) (Oral)   Resp 18   Ht 5\' 3"  (1.6 m)   Wt 56.6 kg   LMP  (LMP Unknown)   SpO2 100%   BMI 22.11 kg/m  Frail Lungs: decreased Card: irr with harsh systolic and diastolic murmur Ext: 1+ edema Neuro: intact   Diagnostic Studies & Laboratory data: I have personally reviewed the following studies and agree with the findings     Recent Radiology Findings:   ECHO TEE  Result Date: 01/01/2023    TRANSESOPHOGEAL ECHO REPORT   Patient Name:   ELIANNI TASSI Higley Date of Exam: 01/01/2023 Medical Rec #:  [id number]  Height:       63.0 in Accession #:    [id number]                    Weight:       135.6 lb Date of Birth:  05/11/1945                    BSA:          1.639 m Patient Age:    76 years                      BP:           116/52 mmHg Patient Gender: F                             HR:           88 bpm. Exam Location:  Inpatient Procedure: Transesophageal Echo, 3D Echo, Color Doppler and Cardiac Doppler Indications:     Mitral Regurgitation i34.0  History:         Patient has prior history of Echocardiogram examinations, most                  recent 12/25/2022. COPD, Arrythmias:Atrial Fibrillation; Risk                  Factors:Hypertension, Sleep Apnea and  Dyslipidemia.  Sonographer:     Irving Burton Senior RDCS Referring Phys:  [id number] BRIDGETTE CHRISTOPHER Diagnosing Phys: Jodelle Red MD PROCEDURE: After discussion of the risks and benefits of a TEE, an informed consent was obtained from the patient. The transesophogeal probe was passed without difficulty through the esophogus of the patient. Sedation performed by different physician. The patient was monitored while under deep sedation. Anesthestetic sedation was provided intravenously by Anesthesiology: 97mg  of Propofol, 60mg  of Lidocaine. Image quality was good. The patient developed no complications during the procedure.  IMPRESSIONS  1. Left ventricular ejection fraction, by estimation, is 65 to 70%. The left ventricle has normal function.  2. Right ventricular systolic function is normal. The right ventricular size is normal. There is severely elevated pulmonary artery systolic pressure. The estimated right ventricular systolic pressure is 60.7 mmHg.  3. Left atrial size was severely dilated. No left atrial/left atrial appendage thrombus was detected.  4. Right atrial size was moderately dilated.  5. A small pericardial effusion is present. The pericardial effusion is circumferential.  6. Flail P3 leaflet with possible cleft between P2-P3. The mitral valve is abnormal. Severe mitral valve regurgitation.  7. Tricuspid valve regurgitation is moderate.  8. The aortic valve is tricuspid. Aortic valve regurgitation is severe. No aortic stenosis is present.  9. There is Moderate (Grade III) plaque involving the descending aorta. 10. Severely dilated pulmonary artery. 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 Severe aortic regurgitation. Severely dilated PA with severely elevated PA pressure. FINDINGS  Left Ventricle: Left ventricular ejection fraction, by estimation, is 65 to 70%. The left ventricle has normal function. The left ventricular internal cavity  size was normal in size. Right Ventricle: The right ventricular size is normal. No increase in right ventricular wall thickness. Right ventricular systolic function is normal. There is severely elevated pulmonary artery systolic pressure. The tricuspid regurgitant velocity is 3.38 m/s, and with an assumed right atrial pressure of 15 mmHg, the estimated right ventricular systolic pressure is 60.7 mmHg. Left Atrium: Left  atrial size was severely dilated. No left atrial/left atrial appendage thrombus was detected. Right Atrium: Right atrial size was moderately dilated. Pericardium: A small pericardial effusion is present. The pericardial effusion is circumferential. Mitral Valve: Flail P3 leaflet with possible cleft between P2-P3. The mitral valve is abnormal. There is mild thickening of the mitral valve leaflet(s). There is mild calcification of the mitral valve leaflet(s). Mildly decreased mobility of the mitral valve leaflets. Severe mitral valve regurgitation. Pulmonary venous flow shows systolic flow reversal. There is no evidence of mitral valve vegetation. Tricuspid Valve: The tricuspid valve is normal in structure. Tricuspid valve regurgitation is moderate . No evidence of tricuspid stenosis. There is no evidence of tricuspid valve vegetation. Aortic Valve: Small lambl's excrescence. Vena contract 0.8 cm. The aortic valve is tricuspid. Aortic valve regurgitation is severe. No aortic stenosis is present. There is no evidence of aortic valve vegetation. Pulmonic Valve: The pulmonic valve was normal in structure. Pulmonic valve regurgitation is trivial. No evidence of pulmonic stenosis. There is no evidence of pulmonic valve vegetation. Aorta: The aortic root and ascending aorta are structurally normal, with no evidence of dilitation. There is moderate (Grade III) plaque involving the descending aorta. Pulmonary Artery: The pulmonary artery is severely dilated. IAS/Shunts: The interatrial septum appears to be  lipomatous. No atrial level shunt detected by color flow Doppler. Additional Comments: Spectral Doppler performed. MR Peak grad:    110.7 mmHg   TRICUSPID VALVE MR Mean grad:    74.0 mmHg    TR Peak grad:   45.7 mmHg MR Vmax:         526.00 cm/s  TR Vmax:        338.00 cm/s MR Vmean:        408.0 cm/s MR PISA:         6.28 cm MR PISA Eff ROA: 44 mm MR PISA Radius:  1.00 cm Jodelle Red MD Electronically signed by Jodelle Red MD Signature Date/Time: 01/01/2023/3:42:00 PM    Final    EP STUDY  Result Date: 01/01/2023 See surgical note for result.     Recent Lab Findings: Lab Results  Component Value Date   WBC 7.6 01/02/2023   HGB 8.7 (L) 01/02/2023   HCT 28.2 (L) 01/02/2023   PLT 172 01/02/2023   GLUCOSE 119 (H) 01/02/2023   CHOL 172 05/21/2022   TRIG 95 05/21/2022   HDL 44 05/21/2022   LDLCALC 110 (H) 05/21/2022   ALT 11 12/24/2022   AST 13 (L) 12/24/2022   NA 140 01/02/2023   K 3.3 (L) 01/02/2023   CL 101 01/02/2023   CREATININE 0.93 01/02/2023   BUN 24 (H) 01/02/2023   CO2 26 01/02/2023   TSH 1.140 05/21/2022   INR 1.6 (H) 08/06/2022      Assessment / Plan:     77 yo female with NYHA class 3-4 symptoms of acute on chronic diastolic heart failure due to afib with RVR, 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 Pt with multiple comorbidities as mentioned above and would be a high risk surgical candidate (greater than 10% miminally) for MVR, AVR, possible TVR and MAZE. Awaiting R and L heart cath but even without that information with expected high filling pressures.  We discussed with her daughter present my concern of her being able to survive this and with her DNR status, this would prohibit surgery and makes her ability to be motivated to survive a protracted recovery unlikely.  I feel  that mteer best option and  if not then medical management alone   I have spent 60 min in review of the records, viewing studies and in face  to face with patient and in coordination of future care    Eugenio Hoes 01/02/2023 1:11 PM

## 2023-01-02 NOTE — Progress Notes (Addendum)
Pt's daughter brought in pt's belongings from [facility], including her home cpap, upper and lower dentures, cell phone, charger, clothing, shoes.  Items documented on the belongings list

## 2023-01-02 NOTE — Plan of Care (Signed)
  Problem: Education: Goal: Ability to demonstrate management of disease process will improve Outcome: Progressing Goal: Ability to verbalize understanding of medication therapies will improve Outcome: Progressing   Problem: Activity: Goal: Capacity to carry out activities will improve Outcome: Progressing   Problem: Cardiac: Goal: Ability to achieve and maintain adequate cardiopulmonary perfusion will improve Outcome: Progressing   Problem: Education: Goal: Knowledge of General Education information will improve Description: Including pain rating scale, medication(s)/side effects and non-pharmacologic comfort measures Outcome: Progressing   Problem: Health Behavior/Discharge Planning: Goal: Ability to manage health-related needs will improve Outcome: Progressing   Problem: Clinical Measurements: Goal: Ability to maintain clinical measurements within normal limits will improve Outcome: Progressing Goal: Diagnostic test results will improve Outcome: Progressing Goal: Respiratory complications will improve Outcome: Progressing Goal: Cardiovascular complication will be avoided Outcome: Progressing   Problem: Activity: Goal: Risk for activity intolerance will decrease Outcome: Progressing   Problem: Nutrition: Goal: Adequate nutrition will be maintained Outcome: Progressing   Problem: Education: Goal: Understanding of CV disease, CV risk reduction, and recovery process will improve Outcome: Progressing   Problem: Activity: Goal: Ability to return to baseline activity level will improve Outcome: Progressing   Problem: Cardiovascular: Goal: Ability to achieve and maintain adequate cardiovascular perfusion will improve Outcome: Progressing   Problem: Health Behavior/Discharge Planning: Goal: Ability to safely manage health-related needs after discharge will improve Outcome: Progressing

## 2023-01-02 NOTE — Assessment & Plan Note (Addendum)
Hypokalemia,   Renal function with serum cr at 0,93 with K at 3,3 and serum bicarbonate at 26.  Na 140   Plan to continue furosemide 40 mg po bid, and spironolactone.  Add SGLT 2 inh.  K correction with Kcl 40 meq x2

## 2023-01-02 NOTE — Assessment & Plan Note (Signed)
Old records personally reviewed, neurology follow up from 12/2020. Seropositive ocular myasthenia gravis, no significant bulbar, or limb weakness noted.  He is not longer on Mestinon or prednisone, never treated with long term steroids sparing agent.   

## 2023-01-02 NOTE — Progress Notes (Addendum)
PHARMACY - ANTICOAGULATION CONSULT NOTE  Pharmacy Consult for heparin infusion (bridging off of apixaban) Indication: atrial fibrillation  Allergies  Allergen Reactions   Albuterol Palpitations   Lisinopril Cough    Patient Measurements: Height: 5\' 3"  (160 cm) Weight: 56.6 kg (124 lb 12.8 oz) IBW/kg (Calculated) : 52.4 Heparin Dosing Weight: 61.5 kg  Vital Signs: Temp: 98.2 F (36.8 C) (10/09 0441) Temp Source: Oral (10/09 0441) BP: 105/39 (10/09 0441) Pulse Rate: 71 (10/09 0441)  Labs: Recent Labs    01/01/23 0502 01/01/23 2113  HGB 7.8*  --   HCT 25.7*  --   PLT 142*  --   APTT  --  41*  HEPARINUNFRC  --  >1.10*  CREATININE 0.71  --     Estimated Creatinine Clearance: 49.5 mL/min (by C-G formula based on SCr of 0.71 mg/dL).   Medical History: Past Medical History:  Diagnosis Date   Atrial fibrillation with rapid ventricular response (HCC) 04/10/2017   Bladder tumor 08/06/2022   BMI 28.0-28.9,adult 05/21/2022   Bronchitis 03/07/2022   Chronic anticoagulation 06/10/2019   Eliquis   Chronic diastolic heart failure (HCC)    Chronic obstructive pulmonary disease (HCC) 05/21/2022   Cigarette smoker 05/21/2022   CKD (chronic kidney disease) stage 3, GFR 30-59 ml/min (HCC)    Demand ischemia (HCC) 09/25/2016   Normal coronaries Jan 2019 when admitted with AF with RVR and chest pain   Dysrhythmia    a-fib   Essential hypertension    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 06/29/2022   GERD (gastroesophageal reflux disease)    HCAP (healthcare-associated pneumonia) 04/10/2017   History of bladder cancer    Hypercholesterolemia 05/21/2022   Iron deficiency anemia due to chronic blood loss 06/29/2022   Lung nodule    OSA on CPAP 04/08/2017   Osteopenia 05/21/2022   Osteoporosis    PAF (paroxysmal atrial fibrillation) (HCC) 09/24/2016   CHADS2 vasc=3   Sleep apnea    cpap  setting at 2.5 per patient    Stage 3a chronic kidney  disease (HCC) 05/21/2022   Symptomatic anemia 08/07/2022   Tobacco abuse     Medications:  Scheduled:   amiodarone  400 mg Oral BID   aspirin  81 mg Oral Pre-Cath   budesonide (PULMICORT) nebulizer solution  0.5 mg Nebulization BID   docusate sodium  100 mg Oral Daily   ferrous sulfate  325 mg Oral Q breakfast   fluticasone  2 spray Each Nare Daily   furosemide  40 mg Oral Daily   levalbuterol  0.63 mg Nebulization BID   metoprolol tartrate  12.5 mg Oral BID   montelukast  10 mg Oral QPM   pantoprazole  40 mg Oral Daily   pregabalin  50 mg Oral BID   rosuvastatin  5 mg Oral QODAY    Assessment: 77 yo F with history of Afib (on apixaban 5mg  BID), HFpEF, COPD, HLD, aortic atherosclerosis, and PAD presents with mixed and multiple valve disease. Patient underwent TEE 10/8 and pharmacy has been consulted to dose heparin infusion for Afib. Eliquis last dose 01/01/23 AM.  aPTT 53 sec is subtherapeutic on 900 units/hr.  Hgb improved, 7.8 > 8.7, pltc 172.  No infusion issues or bleeding per RN.  For [facility] today.   Goal of Therapy:  Heparin level 0.3-0.7 units/ml aPTT 66-102 seconds Monitor platelets by anticoagulation protocol: Yes   Plan:  Increase heparin infusion to 1100 units/hr Monitor daily CBC, aPTT and heparin levels (until correlating with recent  apixaban administration), and for s/sx of bleeding F/u anticoagulation post cath  Trixie Rude, PharmD Clinical Pharmacist 01/02/2023  7:38 AM

## 2023-01-02 NOTE — Progress Notes (Signed)
   01/02/23 2039  BiPAP/CPAP/SIPAP  $ Non-Invasive Home Ventilator  Initial  $ Face Mask Medium Yes  BiPAP/CPAP/SIPAP Pt Type Adult  BiPAP/CPAP/SIPAP Resmed  Mask Type Full face mask  Mask Size Medium  EPAP 10 cmH2O  PEEP 10 cmH20  Flow Rate 3 lpm  Patient Home Equipment Yes  Auto Titrate No  CPAP/SIPAP surface wiped down Yes  Safety Check Completed by RT for Home Unit Yes, no issues noted

## 2023-01-02 NOTE — Assessment & Plan Note (Signed)
Home Cpap.

## 2023-01-02 NOTE — Interval H&P Note (Signed)
History and Physical Interval Note:  01/02/2023 3:25 PM  Dana Horton  has presented today for surgery, with the diagnosis of severe MR.  The various methods of treatment have been discussed with the patient and family. After consideration of risks, benefits and other options for treatment, the patient has consented to  Procedure(s): RIGHT/LEFT HEART CATH AND CORONARY ANGIOGRAPHY (N/A) as a surgical intervention.  The patient's history has been reviewed, patient examined, no change in status, stable for surgery.  I have reviewed the patient's chart and labs.  Questions were answered to the patient's satisfaction.     Lorine Bears

## 2023-01-02 NOTE — Progress Notes (Signed)
Progress Note   Patient: Dana Horton Spickler ZOX:[id number] DOB: January 12, 1946 DOA: 12/24/2022     9 DOS: the patient was seen and examined on 01/02/2023   Brief hospital course: Mrs. Crittendon was admitted to the hospital with the working diagnosis of heart failure decompensation in the setting of valvular heart disease.   77 yo female with the past medical history of atrial fibrillation, CKD, COPD, GERD, hypertension, and OSA who presented with palpitations and generalized weakness. Apparently patient had not been taking metoprolol or apixaban. Because low blood pressure she was brought to the hospital. In the ED her blood pressure was 110/60, 96/63, HR 167, RR 33 and 02 saturation 100%, lungs with decreased breath sounds, bilateral rales and increased work of breathing, heart with S1 and S2 present, irregularly irregular, no gallops, abdomen with no distention and no lower extremity edema.   Chest radiograph with cardiomegaly, bilateral hilar vascular congestion, with small right pleural effusion.   #1 EKG 146 bpm, normal axis, normal intervals, atrial flutter with variable block, no significant ST segment or T wave changes.   Patient was placed on IV furosemide, and IV amiodarone.  Respiratory support with Bipap.   #2 EKG 73 bpm, normal axis, normal intervals, sinus rhythm with no significant ST segment or T wave changes.   Patient was weaned off non invasive mechanical ventilation.  10/08 TEE with no atrial thrombus, severe aortic regurgitation, severe mitral regurgitation, moderate mitral regurgitation. LV systolic function preserved.  10/09 transfer from [facility] to [facility] for cardiac catheterization and cardiothoracic consultation.   Assessment and Plan: * Acute on chronic diastolic CHF (congestive heart failure) (HCC) Echocardiogram 65 to 70%, moderate asymmetric left ventricular hypertrophy of the basal septal. RV with preserved systolic function, RVSP 59.3 mmHg. LA with severe  dilatation, RA with moderate dilatation, severe mitral regurgitation, moderate tricuspid regurgitation, mild to moderate aortic regurgitation.   10/08 TEE with no atrial thrombus, severe aortic regurgitation, severe mitral regurgitation, moderate mitral regurgitation.  10/09 cardiac catheterization  RV 57/1 PA 54/15 mean 35 PCWP 23  Cardiac output 4,9 and index 3,12 (fick).  Positive postcapillary pulmonary hypertension.  Coronary angiogram with normal coronaries.   Patient not candidate for surgical intervention.   Systolic blood pressure 100 to 90   Continue diuresis with furosemide 40 mg po bid. Metoprolol 12.5 mg po bid Spironolactone 12.5 mg po daily.  When blood pressure more stable to start ARB.   Paroxysmal atrial fibrillation with RVR (HCC) Continue rate control with amiodarone and anticoagulation with apixaban.   Hypertensive urgency - She will place on IV nitroglycerin drip that will be tapered down with improvement of her blood pressure and pulmonary edema. - We will continue her antihypertensive therapy.  Essential hypertension Continue blood pressure monitoring.  Continue with metoprolol.   Stage 3a chronic kidney disease (HCC) Hypokalemia,   Renal function with serum cr at 0,93 with K at 3,3 and serum bicarbonate at 26.  Na 140   Plan to continue furosemide 40 mg po bid, and spironolactone.  Add SGLT 2 inh.  K correction with Kcl 40 meq x2   Chronic obstructive pulmonary disease (HCC) No signs of acute exacerbation.   OSA on CPAP Home Cpap.   Acute on chronic anemia Follow up hgb 8,7  Iron panel suggesting anemia of chronic disease.  Iron 46, TIBC 209, transferrin saturation 22, ferritin 562.   Peripheral neuropathy Continue with pregabalin.   GERD without esophagitis Continue with pantoprazole.  Subjective: patient is feeling well, positive sore throat no dyspnea or edema, no PND or orthopnea   Physical Exam: Vitals:    01/02/23 1047 01/02/23 1115 01/02/23 1245 01/02/23 1523  BP:  (!) 90/38    Pulse: 62 63 65   Resp:  18    Temp:  98.5 F (36.9 C)    TempSrc:  Oral    SpO2: 93% 93% 100% 97%  Weight:      Height:       Neurology awake and alert ENT with mild pallor Cardiovascular with S1 and S2 present, regular, positive diastolic murmur at the base, and systolic murmur at the apex, with no gallops, or rubs No JVD Respiratory with no rales or wheezing, no rhonchi Abdomen with no distention No lower extremity edema   Data Reviewed:    Family Communication: no family at the bedside   Disposition: Status is: Inpatient Remains inpatient appropriate because: pending final cardiac work up   Planned Discharge Destination: Home     Author: Coralie Keens, MD 01/02/2023 4:06 PM  For on call review [email or web address].

## 2023-01-02 NOTE — Consult Note (Signed)
[facility]  [facility] Inpatient Consult   01/02/2023  Zyann Mabry Buzzell 1945/11/16 [id number]  [facility] [[facility]  Accountable Care Organization [ACO] Patient: Medicare ACO REACH  Primary Care Provider: Crist Fat, MD with [facility] in [place],  this provider is listed for the transition of care follow up appointments    [facility] Liaison spoke with patient who states she is getting ready for another procedure today.  She endorses her PCP. Patient also discussed in progression meeting and explained reason for follow up.  Patient was generous and accepting.    The patient was screened for LOS 8 days readmission hospitalization with noted medium high risk score for unplanned readmission risk 2 hospital admissions in 6 months.  The patient was assessed for potential post hospital transition for care coordination.   Plan: [facility] Liaison will continue to follow progress and disposition to asess for post hospital community care coordination/management needs.  Referral request for community care coordination: pending ongoing needs.   Community Care Management/Population Health does not replace or interfere with any arrangements made by the Inpatient Transition of Care team.   For questions contact:   Charlesetta Shanks, RN, BSN, CCM [place]  [facility], [facility] Liaison Direct Dial: [phone] or secure chat Website: [email or web address]

## 2023-01-02 NOTE — H&P (View-Only) (Signed)
Height:       63.0 in Accession #:    [id number]                    Weight:       130.1 lb Date of Birth:  1945-11-21                    BSA:          1.611 m Patient Age:    77 years                      BP:           102/33 mmHg Patient Gender: F                             HR:           72 bpm. Exam Location:  Inpatient  Procedure: 2D Echo, Cardiac Doppler and Color Doppler  Indications:    CHF  History:        Patient has no prior history of Echocardiogram examinations. CHF, COPD, Arrythmias:Atrial Fibrillation; Risk Factors:Hypertension and  Dyslipidemia.  Sonographer:    Melton Krebs RDCS, FE, PE Referring Phys: [id number] JAN A MANSY  IMPRESSIONS   1. Left ventricular ejection fraction, by estimation, is 65 to 70%. The left ventricle has normal function. The left ventricle has no regional wall motion abnormalities. There is moderate asymmetric left ventricular hypertrophy of the basal-septal segment. Left ventricular diastolic parameters are consistent with Grade III diastolic dysfunction (restrictive). 2. Right ventricular systolic function is normal. The right ventricular size is normal. There is moderately elevated pulmonary artery systolic pressure. The estimated right ventricular systolic pressure is 59.3 mmHg. 3. Left atrial size was severely dilated. 4. Right atrial size was moderately dilated. 5. The mitral valve is thickened with mildly restricted leaflet motion. The left atrium is severely dialted with dialtion of the MV annulus. There is severe central MR. The mitral valve is abnormal. Severe mitral valve regurgitation. No evidence of mitral stenosis. 6. Tricuspid valve regurgitation is moderate. 7. The aortic valve is tricuspid. Aortic valve regurgitation is mild to moderate. No aortic stenosis is present. Aortic valve area, by VTI measures 2.45 cm. Aortic valve mean gradient measures 9.0 mmHg. Aortic valve Vmax measures 2.03 m/s. 8. The inferior vena cava is dilated in size with <50% respiratory variability, suggesting right atrial pressure of 15 mmHg.  Conclusion(s)/Recommendation(s): Consider TEE to further evalaute valvular disease if clinically indicated.  FINDINGS Left Ventricle: Left ventricular ejection fraction, by estimation, is 65 to 70%. The left ventricle has normal function. The left ventricle has no regional wall motion abnormalities. The left ventricular internal cavity size was normal in size. There is moderate asymmetric left ventricular hypertrophy of the basal-septal segment. Left ventricular  diastolic parameters are consistent with Grade III diastolic dysfunction (restrictive).  Right Ventricle: The right ventricular size is normal. No increase in right ventricular wall thickness. Right ventricular systolic function is normal. There is moderately elevated pulmonary artery systolic pressure. The tricuspid regurgitant velocity is 3.51 m/s, and with an assumed right atrial pressure of 10 mmHg, the estimated right ventricular systolic pressure is 59.3 mmHg.  Left Atrium: Left atrial size was severely dilated.  Right Atrium: Right atrial size was moderately dilated.  Pericardium: There is no evidence of pericardial effusion.  Mitral Valve: The mitral valve is thickened with mildly restricted leaflet motion.  Height:       63.0 in Accession #:    [id number]                    Weight:       130.1 lb Date of Birth:  1945-11-21                    BSA:          1.611 m Patient Age:    77 years                      BP:           102/33 mmHg Patient Gender: F                             HR:           72 bpm. Exam Location:  Inpatient  Procedure: 2D Echo, Cardiac Doppler and Color Doppler  Indications:    CHF  History:        Patient has no prior history of Echocardiogram examinations. CHF, COPD, Arrythmias:Atrial Fibrillation; Risk Factors:Hypertension and  Dyslipidemia.  Sonographer:    Melton Krebs RDCS, FE, PE Referring Phys: [id number] JAN A MANSY  IMPRESSIONS   1. Left ventricular ejection fraction, by estimation, is 65 to 70%. The left ventricle has normal function. The left ventricle has no regional wall motion abnormalities. There is moderate asymmetric left ventricular hypertrophy of the basal-septal segment. Left ventricular diastolic parameters are consistent with Grade III diastolic dysfunction (restrictive). 2. Right ventricular systolic function is normal. The right ventricular size is normal. There is moderately elevated pulmonary artery systolic pressure. The estimated right ventricular systolic pressure is 59.3 mmHg. 3. Left atrial size was severely dilated. 4. Right atrial size was moderately dilated. 5. The mitral valve is thickened with mildly restricted leaflet motion. The left atrium is severely dialted with dialtion of the MV annulus. There is severe central MR. The mitral valve is abnormal. Severe mitral valve regurgitation. No evidence of mitral stenosis. 6. Tricuspid valve regurgitation is moderate. 7. The aortic valve is tricuspid. Aortic valve regurgitation is mild to moderate. No aortic stenosis is present. Aortic valve area, by VTI measures 2.45 cm. Aortic valve mean gradient measures 9.0 mmHg. Aortic valve Vmax measures 2.03 m/s. 8. The inferior vena cava is dilated in size with <50% respiratory variability, suggesting right atrial pressure of 15 mmHg.  Conclusion(s)/Recommendation(s): Consider TEE to further evalaute valvular disease if clinically indicated.  FINDINGS Left Ventricle: Left ventricular ejection fraction, by estimation, is 65 to 70%. The left ventricle has normal function. The left ventricle has no regional wall motion abnormalities. The left ventricular internal cavity size was normal in size. There is moderate asymmetric left ventricular hypertrophy of the basal-septal segment. Left ventricular  diastolic parameters are consistent with Grade III diastolic dysfunction (restrictive).  Right Ventricle: The right ventricular size is normal. No increase in right ventricular wall thickness. Right ventricular systolic function is normal. There is moderately elevated pulmonary artery systolic pressure. The tricuspid regurgitant velocity is 3.51 m/s, and with an assumed right atrial pressure of 10 mmHg, the estimated right ventricular systolic pressure is 59.3 mmHg.  Left Atrium: Left atrial size was severely dilated.  Right Atrium: Right atrial size was moderately dilated.  Pericardium: There is no evidence of pericardial effusion.  Mitral Valve: The mitral valve is thickened with mildly restricted leaflet motion.  Height:       63.0 in Accession #:    [id number]                    Weight:       130.1 lb Date of Birth:  1945-11-21                    BSA:          1.611 m Patient Age:    77 years                      BP:           102/33 mmHg Patient Gender: F                             HR:           72 bpm. Exam Location:  Inpatient  Procedure: 2D Echo, Cardiac Doppler and Color Doppler  Indications:    CHF  History:        Patient has no prior history of Echocardiogram examinations. CHF, COPD, Arrythmias:Atrial Fibrillation; Risk Factors:Hypertension and  Dyslipidemia.  Sonographer:    Melton Krebs RDCS, FE, PE Referring Phys: [id number] JAN A MANSY  IMPRESSIONS   1. Left ventricular ejection fraction, by estimation, is 65 to 70%. The left ventricle has normal function. The left ventricle has no regional wall motion abnormalities. There is moderate asymmetric left ventricular hypertrophy of the basal-septal segment. Left ventricular diastolic parameters are consistent with Grade III diastolic dysfunction (restrictive). 2. Right ventricular systolic function is normal. The right ventricular size is normal. There is moderately elevated pulmonary artery systolic pressure. The estimated right ventricular systolic pressure is 59.3 mmHg. 3. Left atrial size was severely dilated. 4. Right atrial size was moderately dilated. 5. The mitral valve is thickened with mildly restricted leaflet motion. The left atrium is severely dialted with dialtion of the MV annulus. There is severe central MR. The mitral valve is abnormal. Severe mitral valve regurgitation. No evidence of mitral stenosis. 6. Tricuspid valve regurgitation is moderate. 7. The aortic valve is tricuspid. Aortic valve regurgitation is mild to moderate. No aortic stenosis is present. Aortic valve area, by VTI measures 2.45 cm. Aortic valve mean gradient measures 9.0 mmHg. Aortic valve Vmax measures 2.03 m/s. 8. The inferior vena cava is dilated in size with <50% respiratory variability, suggesting right atrial pressure of 15 mmHg.  Conclusion(s)/Recommendation(s): Consider TEE to further evalaute valvular disease if clinically indicated.  FINDINGS Left Ventricle: Left ventricular ejection fraction, by estimation, is 65 to 70%. The left ventricle has normal function. The left ventricle has no regional wall motion abnormalities. The left ventricular internal cavity size was normal in size. There is moderate asymmetric left ventricular hypertrophy of the basal-septal segment. Left ventricular  diastolic parameters are consistent with Grade III diastolic dysfunction (restrictive).  Right Ventricle: The right ventricular size is normal. No increase in right ventricular wall thickness. Right ventricular systolic function is normal. There is moderately elevated pulmonary artery systolic pressure. The tricuspid regurgitant velocity is 3.51 m/s, and with an assumed right atrial pressure of 10 mmHg, the estimated right ventricular systolic pressure is 59.3 mmHg.  Left Atrium: Left atrial size was severely dilated.  Right Atrium: Right atrial size was moderately dilated.  Pericardium: There is no evidence of pericardial effusion.  Mitral Valve: The mitral valve is thickened with mildly restricted leaflet motion.  Decel Time: 272 msec     TR Vmax:        351.00 cm/s MV E velocity: 172.00 cm/s MV A velocity: 49.90 cm/s   SHUNTS MV E/A ratio:  3.45         Systemic VTI:  0.37 m Systemic Diam: 1.90 cm  Arvilla Meres MD Electronically signed by Arvilla Meres MD Signature Date/Time: 12/25/2022/10:16:24 AM    Final   TEE  ECHO TEE 01/01/2023  Narrative TRANSESOPHOGEAL ECHO REPORT    Patient Name:   Dana Horton Date of Exam: 01/01/2023 Medical Rec #:  [id number]                     Height:       63.0 in Accession #:    [id number]                    Weight:       135.6 lb Date of Birth:  10-09-1945                    BSA:          1.639 m Patient Age:    77 years                      BP:           116/52 mmHg Patient Gender: F                              HR:           88 bpm. Exam Location:  Inpatient  Procedure: Transesophageal Echo, 3D Echo, Color Doppler and Cardiac Doppler  Indications:     Mitral Regurgitation i34.0  History:         Patient has prior history of Echocardiogram examinations, most recent 12/25/2022. COPD, Arrythmias:Atrial Fibrillation; Risk Factors:Hypertension, Sleep Apnea and Dyslipidemia.  Sonographer:     Irving Burton Senior RDCS Referring Phys:  [id number] BRIDGETTE CHRISTOPHER Diagnosing Phys: Jodelle Red MD  PROCEDURE: After discussion of the risks and benefits of a TEE, an informed consent was obtained from the patient. The transesophogeal probe was passed without difficulty through the esophogus of the patient. Sedation performed by different physician. The patient was monitored while under deep sedation. Anesthestetic sedation was provided intravenously by Anesthesiology: 97mg  of Propofol, 60mg  of Lidocaine. Image quality was good. The patient developed no complications during the procedure.  IMPRESSIONS   1. Left ventricular ejection fraction, by estimation, is 65 to 70%. The left ventricle has normal function. 2. Right ventricular systolic function is normal. The right ventricular size is normal. There is severely elevated pulmonary artery systolic pressure. The estimated right ventricular systolic pressure is 60.7 mmHg. 3. Left atrial size was severely dilated. No left atrial/left atrial appendage thrombus was detected. 4. Right atrial size was moderately dilated. 5. A small pericardial effusion is present. The pericardial effusion is circumferential. 6. Flail P3 leaflet with possible cleft between P2-P3. The mitral valve is abnormal. Severe mitral valve regurgitation. 7. Tricuspid valve regurgitation is moderate. 8. The aortic valve is tricuspid. Aortic valve regurgitation is severe. No aortic stenosis is present. 9. 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 10. Severely dilated pulmonary artery.  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 Severe aortic regurgitation. Severely dilated PA with severely elevated PA pressure.  FINDINGS  Decel Time: 272 msec     TR Vmax:        351.00 cm/s MV E velocity: 172.00 cm/s MV A velocity: 49.90 cm/s   SHUNTS MV E/A ratio:  3.45         Systemic VTI:  0.37 m Systemic Diam: 1.90 cm  Arvilla Meres MD Electronically signed by Arvilla Meres MD Signature Date/Time: 12/25/2022/10:16:24 AM    Final   TEE  ECHO TEE 01/01/2023  Narrative TRANSESOPHOGEAL ECHO REPORT    Patient Name:   Dana Horton Date of Exam: 01/01/2023 Medical Rec #:  [id number]                     Height:       63.0 in Accession #:    [id number]                    Weight:       135.6 lb Date of Birth:  10-09-1945                    BSA:          1.639 m Patient Age:    77 years                      BP:           116/52 mmHg Patient Gender: F                              HR:           88 bpm. Exam Location:  Inpatient  Procedure: Transesophageal Echo, 3D Echo, Color Doppler and Cardiac Doppler  Indications:     Mitral Regurgitation i34.0  History:         Patient has prior history of Echocardiogram examinations, most recent 12/25/2022. COPD, Arrythmias:Atrial Fibrillation; Risk Factors:Hypertension, Sleep Apnea and Dyslipidemia.  Sonographer:     Irving Burton Senior RDCS Referring Phys:  [id number] BRIDGETTE CHRISTOPHER Diagnosing Phys: Jodelle Red MD  PROCEDURE: After discussion of the risks and benefits of a TEE, an informed consent was obtained from the patient. The transesophogeal probe was passed without difficulty through the esophogus of the patient. Sedation performed by different physician. The patient was monitored while under deep sedation. Anesthestetic sedation was provided intravenously by Anesthesiology: 97mg  of Propofol, 60mg  of Lidocaine. Image quality was good. The patient developed no complications during the procedure.  IMPRESSIONS   1. Left ventricular ejection fraction, by estimation, is 65 to 70%. The left ventricle has normal function. 2. Right ventricular systolic function is normal. The right ventricular size is normal. There is severely elevated pulmonary artery systolic pressure. The estimated right ventricular systolic pressure is 60.7 mmHg. 3. Left atrial size was severely dilated. No left atrial/left atrial appendage thrombus was detected. 4. Right atrial size was moderately dilated. 5. A small pericardial effusion is present. The pericardial effusion is circumferential. 6. Flail P3 leaflet with possible cleft between P2-P3. The mitral valve is abnormal. Severe mitral valve regurgitation. 7. Tricuspid valve regurgitation is moderate. 8. The aortic valve is tricuspid. Aortic valve regurgitation is severe. No aortic stenosis is present. 9. 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 10. Severely dilated pulmonary artery.  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 Severe aortic regurgitation. Severely dilated PA with severely elevated PA pressure.  FINDINGS  Height:       63.0 in Accession #:    [id number]                    Weight:       130.1 lb Date of Birth:  1945-11-21                    BSA:          1.611 m Patient Age:    77 years                      BP:           102/33 mmHg Patient Gender: F                             HR:           72 bpm. Exam Location:  Inpatient  Procedure: 2D Echo, Cardiac Doppler and Color Doppler  Indications:    CHF  History:        Patient has no prior history of Echocardiogram examinations. CHF, COPD, Arrythmias:Atrial Fibrillation; Risk Factors:Hypertension and  Dyslipidemia.  Sonographer:    Melton Krebs RDCS, FE, PE Referring Phys: [id number] JAN A MANSY  IMPRESSIONS   1. Left ventricular ejection fraction, by estimation, is 65 to 70%. The left ventricle has normal function. The left ventricle has no regional wall motion abnormalities. There is moderate asymmetric left ventricular hypertrophy of the basal-septal segment. Left ventricular diastolic parameters are consistent with Grade III diastolic dysfunction (restrictive). 2. Right ventricular systolic function is normal. The right ventricular size is normal. There is moderately elevated pulmonary artery systolic pressure. The estimated right ventricular systolic pressure is 59.3 mmHg. 3. Left atrial size was severely dilated. 4. Right atrial size was moderately dilated. 5. The mitral valve is thickened with mildly restricted leaflet motion. The left atrium is severely dialted with dialtion of the MV annulus. There is severe central MR. The mitral valve is abnormal. Severe mitral valve regurgitation. No evidence of mitral stenosis. 6. Tricuspid valve regurgitation is moderate. 7. The aortic valve is tricuspid. Aortic valve regurgitation is mild to moderate. No aortic stenosis is present. Aortic valve area, by VTI measures 2.45 cm. Aortic valve mean gradient measures 9.0 mmHg. Aortic valve Vmax measures 2.03 m/s. 8. The inferior vena cava is dilated in size with <50% respiratory variability, suggesting right atrial pressure of 15 mmHg.  Conclusion(s)/Recommendation(s): Consider TEE to further evalaute valvular disease if clinically indicated.  FINDINGS Left Ventricle: Left ventricular ejection fraction, by estimation, is 65 to 70%. The left ventricle has normal function. The left ventricle has no regional wall motion abnormalities. The left ventricular internal cavity size was normal in size. There is moderate asymmetric left ventricular hypertrophy of the basal-septal segment. Left ventricular  diastolic parameters are consistent with Grade III diastolic dysfunction (restrictive).  Right Ventricle: The right ventricular size is normal. No increase in right ventricular wall thickness. Right ventricular systolic function is normal. There is moderately elevated pulmonary artery systolic pressure. The tricuspid regurgitant velocity is 3.51 m/s, and with an assumed right atrial pressure of 10 mmHg, the estimated right ventricular systolic pressure is 59.3 mmHg.  Left Atrium: Left atrial size was severely dilated.  Right Atrium: Right atrial size was moderately dilated.  Pericardium: There is no evidence of pericardial effusion.  Mitral Valve: The mitral valve is thickened with mildly restricted leaflet motion.

## 2023-01-02 NOTE — Assessment & Plan Note (Addendum)
Continue blood pressure monitoring.  Continue with metoprolol.  

## 2023-01-02 NOTE — Progress Notes (Signed)
Height:       63.0 in Accession #:    [id number]                    Weight:       130.1 lb Date of Birth:  1945-11-21                    BSA:          1.611 m Patient Age:    76 years                      BP:           102/33 mmHg Patient Gender: F                             HR:           72 bpm. Exam Location:  Inpatient  Procedure: 2D Echo, Cardiac Doppler and Color Doppler  Indications:    CHF  History:        Patient has no prior history of Echocardiogram examinations. CHF, COPD, Arrythmias:Atrial Fibrillation; Risk Factors:Hypertension and  Dyslipidemia.  Sonographer:    Melton Krebs RDCS, FE, PE Referring Phys: [id number] JAN A MANSY  IMPRESSIONS   1. Left ventricular ejection fraction, by estimation, is 65 to 70%. The left ventricle has normal function. The left ventricle has no regional wall motion abnormalities. There is moderate asymmetric left ventricular hypertrophy of the basal-septal segment. Left ventricular diastolic parameters are consistent with Grade III diastolic dysfunction (restrictive). 2. Right ventricular systolic function is normal. The right ventricular size is normal. There is moderately elevated pulmonary artery systolic pressure. The estimated right ventricular systolic pressure is 59.3 mmHg. 3. Left atrial size was severely dilated. 4. Right atrial size was moderately dilated. 5. The mitral valve is thickened with mildly restricted leaflet motion. The left atrium is severely dialted with dialtion of the MV annulus. There is severe central MR. The mitral valve is abnormal. Severe mitral valve regurgitation. No evidence of mitral stenosis. 6. Tricuspid valve regurgitation is moderate. 7. The aortic valve is tricuspid. Aortic valve regurgitation is mild to moderate. No aortic stenosis is present. Aortic valve area, by VTI measures 2.45 cm. Aortic valve mean gradient measures 9.0 mmHg. Aortic valve Vmax measures 2.03 m/s. 8. The inferior vena cava is dilated in size with <50% respiratory variability, suggesting right atrial pressure of 15 mmHg.  Conclusion(s)/Recommendation(s): Consider TEE to further evalaute valvular disease if clinically indicated.  FINDINGS Left Ventricle: Left ventricular ejection fraction, by estimation, is 65 to 70%. The left ventricle has normal function. The left ventricle has no regional wall motion abnormalities. The left ventricular internal cavity size was normal in size. There is moderate asymmetric left ventricular hypertrophy of the basal-septal segment. Left ventricular  diastolic parameters are consistent with Grade III diastolic dysfunction (restrictive).  Right Ventricle: The right ventricular size is normal. No increase in right ventricular wall thickness. Right ventricular systolic function is normal. There is moderately elevated pulmonary artery systolic pressure. The tricuspid regurgitant velocity is 3.51 m/s, and with an assumed right atrial pressure of 10 mmHg, the estimated right ventricular systolic pressure is 59.3 mmHg.  Left Atrium: Left atrial size was severely dilated.  Right Atrium: Right atrial size was moderately dilated.  Pericardium: There is no evidence of pericardial effusion.  Mitral Valve: The mitral valve is thickened with mildly restricted leaflet motion.  Height:       63.0 in Accession #:    [id number]                    Weight:       130.1 lb Date of Birth:  1945-11-21                    BSA:          1.611 m Patient Age:    76 years                      BP:           102/33 mmHg Patient Gender: F                             HR:           72 bpm. Exam Location:  Inpatient  Procedure: 2D Echo, Cardiac Doppler and Color Doppler  Indications:    CHF  History:        Patient has no prior history of Echocardiogram examinations. CHF, COPD, Arrythmias:Atrial Fibrillation; Risk Factors:Hypertension and  Dyslipidemia.  Sonographer:    Melton Krebs RDCS, FE, PE Referring Phys: [id number] JAN A MANSY  IMPRESSIONS   1. Left ventricular ejection fraction, by estimation, is 65 to 70%. The left ventricle has normal function. The left ventricle has no regional wall motion abnormalities. There is moderate asymmetric left ventricular hypertrophy of the basal-septal segment. Left ventricular diastolic parameters are consistent with Grade III diastolic dysfunction (restrictive). 2. Right ventricular systolic function is normal. The right ventricular size is normal. There is moderately elevated pulmonary artery systolic pressure. The estimated right ventricular systolic pressure is 59.3 mmHg. 3. Left atrial size was severely dilated. 4. Right atrial size was moderately dilated. 5. The mitral valve is thickened with mildly restricted leaflet motion. The left atrium is severely dialted with dialtion of the MV annulus. There is severe central MR. The mitral valve is abnormal. Severe mitral valve regurgitation. No evidence of mitral stenosis. 6. Tricuspid valve regurgitation is moderate. 7. The aortic valve is tricuspid. Aortic valve regurgitation is mild to moderate. No aortic stenosis is present. Aortic valve area, by VTI measures 2.45 cm. Aortic valve mean gradient measures 9.0 mmHg. Aortic valve Vmax measures 2.03 m/s. 8. The inferior vena cava is dilated in size with <50% respiratory variability, suggesting right atrial pressure of 15 mmHg.  Conclusion(s)/Recommendation(s): Consider TEE to further evalaute valvular disease if clinically indicated.  FINDINGS Left Ventricle: Left ventricular ejection fraction, by estimation, is 65 to 70%. The left ventricle has normal function. The left ventricle has no regional wall motion abnormalities. The left ventricular internal cavity size was normal in size. There is moderate asymmetric left ventricular hypertrophy of the basal-septal segment. Left ventricular  diastolic parameters are consistent with Grade III diastolic dysfunction (restrictive).  Right Ventricle: The right ventricular size is normal. No increase in right ventricular wall thickness. Right ventricular systolic function is normal. There is moderately elevated pulmonary artery systolic pressure. The tricuspid regurgitant velocity is 3.51 m/s, and with an assumed right atrial pressure of 10 mmHg, the estimated right ventricular systolic pressure is 59.3 mmHg.  Left Atrium: Left atrial size was severely dilated.  Right Atrium: Right atrial size was moderately dilated.  Pericardium: There is no evidence of pericardial effusion.  Mitral Valve: The mitral valve is thickened with mildly restricted leaflet motion.  Decel Time: 272 msec     TR Vmax:        351.00 cm/s MV E velocity: 172.00 cm/s MV A velocity: 49.90 cm/s   SHUNTS MV E/A ratio:  3.45         Systemic VTI:  0.37 m Systemic Diam: 1.90 cm  Arvilla Meres MD Electronically signed by Arvilla Meres MD Signature Date/Time: 12/25/2022/10:16:24 AM    Final   TEE  ECHO TEE 01/01/2023  Narrative TRANSESOPHOGEAL ECHO REPORT    Patient Name:   Dana Horton Date of Exam: 01/01/2023 Medical Rec #:  [id number]                     Height:       63.0 in Accession #:    [id number]                    Weight:       135.6 lb Date of Birth:  10-09-1945                    BSA:          1.639 m Patient Age:    76 years                      BP:           116/52 mmHg Patient Gender: F                              HR:           88 bpm. Exam Location:  Inpatient  Procedure: Transesophageal Echo, 3D Echo, Color Doppler and Cardiac Doppler  Indications:     Mitral Regurgitation i34.0  History:         Patient has prior history of Echocardiogram examinations, most recent 12/25/2022. COPD, Arrythmias:Atrial Fibrillation; Risk Factors:Hypertension, Sleep Apnea and Dyslipidemia.  Sonographer:     Irving Burton Senior RDCS Referring Phys:  [id number] BRIDGETTE CHRISTOPHER Diagnosing Phys: Jodelle Red MD  PROCEDURE: After discussion of the risks and benefits of a TEE, an informed consent was obtained from the patient. The transesophogeal probe was passed without difficulty through the esophogus of the patient. Sedation performed by different physician. The patient was monitored while under deep sedation. Anesthestetic sedation was provided intravenously by Anesthesiology: 97mg  of Propofol, 60mg  of Lidocaine. Image quality was good. The patient developed no complications during the procedure.  IMPRESSIONS   1. Left ventricular ejection fraction, by estimation, is 65 to 70%. The left ventricle has normal function. 2. Right ventricular systolic function is normal. The right ventricular size is normal. There is severely elevated pulmonary artery systolic pressure. The estimated right ventricular systolic pressure is 60.7 mmHg. 3. Left atrial size was severely dilated. No left atrial/left atrial appendage thrombus was detected. 4. Right atrial size was moderately dilated. 5. A small pericardial effusion is present. The pericardial effusion is circumferential. 6. Flail P3 leaflet with possible cleft between P2-P3. The mitral valve is abnormal. Severe mitral valve regurgitation. 7. Tricuspid valve regurgitation is moderate. 8. The aortic valve is tricuspid. Aortic valve regurgitation is severe. No aortic stenosis is present. 9. 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 10. Severely dilated pulmonary artery.  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 Severe aortic regurgitation. Severely dilated PA with severely elevated PA pressure.  FINDINGS  Height:       63.0 in Accession #:    [id number]                    Weight:       130.1 lb Date of Birth:  1945-11-21                    BSA:          1.611 m Patient Age:    76 years                      BP:           102/33 mmHg Patient Gender: F                             HR:           72 bpm. Exam Location:  Inpatient  Procedure: 2D Echo, Cardiac Doppler and Color Doppler  Indications:    CHF  History:        Patient has no prior history of Echocardiogram examinations. CHF, COPD, Arrythmias:Atrial Fibrillation; Risk Factors:Hypertension and  Dyslipidemia.  Sonographer:    Melton Krebs RDCS, FE, PE Referring Phys: [id number] JAN A MANSY  IMPRESSIONS   1. Left ventricular ejection fraction, by estimation, is 65 to 70%. The left ventricle has normal function. The left ventricle has no regional wall motion abnormalities. There is moderate asymmetric left ventricular hypertrophy of the basal-septal segment. Left ventricular diastolic parameters are consistent with Grade III diastolic dysfunction (restrictive). 2. Right ventricular systolic function is normal. The right ventricular size is normal. There is moderately elevated pulmonary artery systolic pressure. The estimated right ventricular systolic pressure is 59.3 mmHg. 3. Left atrial size was severely dilated. 4. Right atrial size was moderately dilated. 5. The mitral valve is thickened with mildly restricted leaflet motion. The left atrium is severely dialted with dialtion of the MV annulus. There is severe central MR. The mitral valve is abnormal. Severe mitral valve regurgitation. No evidence of mitral stenosis. 6. Tricuspid valve regurgitation is moderate. 7. The aortic valve is tricuspid. Aortic valve regurgitation is mild to moderate. No aortic stenosis is present. Aortic valve area, by VTI measures 2.45 cm. Aortic valve mean gradient measures 9.0 mmHg. Aortic valve Vmax measures 2.03 m/s. 8. The inferior vena cava is dilated in size with <50% respiratory variability, suggesting right atrial pressure of 15 mmHg.  Conclusion(s)/Recommendation(s): Consider TEE to further evalaute valvular disease if clinically indicated.  FINDINGS Left Ventricle: Left ventricular ejection fraction, by estimation, is 65 to 70%. The left ventricle has normal function. The left ventricle has no regional wall motion abnormalities. The left ventricular internal cavity size was normal in size. There is moderate asymmetric left ventricular hypertrophy of the basal-septal segment. Left ventricular  diastolic parameters are consistent with Grade III diastolic dysfunction (restrictive).  Right Ventricle: The right ventricular size is normal. No increase in right ventricular wall thickness. Right ventricular systolic function is normal. There is moderately elevated pulmonary artery systolic pressure. The tricuspid regurgitant velocity is 3.51 m/s, and with an assumed right atrial pressure of 10 mmHg, the estimated right ventricular systolic pressure is 59.3 mmHg.  Left Atrium: Left atrial size was severely dilated.  Right Atrium: Right atrial size was moderately dilated.  Pericardium: There is no evidence of pericardial effusion.  Mitral Valve: The mitral valve is thickened with mildly restricted leaflet motion.  Decel Time: 272 msec     TR Vmax:        351.00 cm/s MV E velocity: 172.00 cm/s MV A velocity: 49.90 cm/s   SHUNTS MV E/A ratio:  3.45         Systemic VTI:  0.37 m Systemic Diam: 1.90 cm  Arvilla Meres MD Electronically signed by Arvilla Meres MD Signature Date/Time: 12/25/2022/10:16:24 AM    Final   TEE  ECHO TEE 01/01/2023  Narrative TRANSESOPHOGEAL ECHO REPORT    Patient Name:   Dana Horton Date of Exam: 01/01/2023 Medical Rec #:  [id number]                     Height:       63.0 in Accession #:    [id number]                    Weight:       135.6 lb Date of Birth:  10-09-1945                    BSA:          1.639 m Patient Age:    76 years                      BP:           116/52 mmHg Patient Gender: F                              HR:           88 bpm. Exam Location:  Inpatient  Procedure: Transesophageal Echo, 3D Echo, Color Doppler and Cardiac Doppler  Indications:     Mitral Regurgitation i34.0  History:         Patient has prior history of Echocardiogram examinations, most recent 12/25/2022. COPD, Arrythmias:Atrial Fibrillation; Risk Factors:Hypertension, Sleep Apnea and Dyslipidemia.  Sonographer:     Irving Burton Senior RDCS Referring Phys:  [id number] BRIDGETTE CHRISTOPHER Diagnosing Phys: Jodelle Red MD  PROCEDURE: After discussion of the risks and benefits of a TEE, an informed consent was obtained from the patient. The transesophogeal probe was passed without difficulty through the esophogus of the patient. Sedation performed by different physician. The patient was monitored while under deep sedation. Anesthestetic sedation was provided intravenously by Anesthesiology: 97mg  of Propofol, 60mg  of Lidocaine. Image quality was good. The patient developed no complications during the procedure.  IMPRESSIONS   1. Left ventricular ejection fraction, by estimation, is 65 to 70%. The left ventricle has normal function. 2. Right ventricular systolic function is normal. The right ventricular size is normal. There is severely elevated pulmonary artery systolic pressure. The estimated right ventricular systolic pressure is 60.7 mmHg. 3. Left atrial size was severely dilated. No left atrial/left atrial appendage thrombus was detected. 4. Right atrial size was moderately dilated. 5. A small pericardial effusion is present. The pericardial effusion is circumferential. 6. Flail P3 leaflet with possible cleft between P2-P3. The mitral valve is abnormal. Severe mitral valve regurgitation. 7. Tricuspid valve regurgitation is moderate. 8. The aortic valve is tricuspid. Aortic valve regurgitation is severe. No aortic stenosis is present. 9. 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 10. Severely dilated pulmonary artery.  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 Severe aortic regurgitation. Severely dilated PA with severely elevated PA pressure.  FINDINGS  Height:       63.0 in Accession #:    [id number]                    Weight:       130.1 lb Date of Birth:  1945-11-21                    BSA:          1.611 m Patient Age:    76 years                      BP:           102/33 mmHg Patient Gender: F                             HR:           72 bpm. Exam Location:  Inpatient  Procedure: 2D Echo, Cardiac Doppler and Color Doppler  Indications:    CHF  History:        Patient has no prior history of Echocardiogram examinations. CHF, COPD, Arrythmias:Atrial Fibrillation; Risk Factors:Hypertension and  Dyslipidemia.  Sonographer:    Melton Krebs RDCS, FE, PE Referring Phys: [id number] JAN A MANSY  IMPRESSIONS   1. Left ventricular ejection fraction, by estimation, is 65 to 70%. The left ventricle has normal function. The left ventricle has no regional wall motion abnormalities. There is moderate asymmetric left ventricular hypertrophy of the basal-septal segment. Left ventricular diastolic parameters are consistent with Grade III diastolic dysfunction (restrictive). 2. Right ventricular systolic function is normal. The right ventricular size is normal. There is moderately elevated pulmonary artery systolic pressure. The estimated right ventricular systolic pressure is 59.3 mmHg. 3. Left atrial size was severely dilated. 4. Right atrial size was moderately dilated. 5. The mitral valve is thickened with mildly restricted leaflet motion. The left atrium is severely dialted with dialtion of the MV annulus. There is severe central MR. The mitral valve is abnormal. Severe mitral valve regurgitation. No evidence of mitral stenosis. 6. Tricuspid valve regurgitation is moderate. 7. The aortic valve is tricuspid. Aortic valve regurgitation is mild to moderate. No aortic stenosis is present. Aortic valve area, by VTI measures 2.45 cm. Aortic valve mean gradient measures 9.0 mmHg. Aortic valve Vmax measures 2.03 m/s. 8. The inferior vena cava is dilated in size with <50% respiratory variability, suggesting right atrial pressure of 15 mmHg.  Conclusion(s)/Recommendation(s): Consider TEE to further evalaute valvular disease if clinically indicated.  FINDINGS Left Ventricle: Left ventricular ejection fraction, by estimation, is 65 to 70%. The left ventricle has normal function. The left ventricle has no regional wall motion abnormalities. The left ventricular internal cavity size was normal in size. There is moderate asymmetric left ventricular hypertrophy of the basal-septal segment. Left ventricular  diastolic parameters are consistent with Grade III diastolic dysfunction (restrictive).  Right Ventricle: The right ventricular size is normal. No increase in right ventricular wall thickness. Right ventricular systolic function is normal. There is moderately elevated pulmonary artery systolic pressure. The tricuspid regurgitant velocity is 3.51 m/s, and with an assumed right atrial pressure of 10 mmHg, the estimated right ventricular systolic pressure is 59.3 mmHg.  Left Atrium: Left atrial size was severely dilated.  Right Atrium: Right atrial size was moderately dilated.  Pericardium: There is no evidence of pericardial effusion.  Mitral Valve: The mitral valve is thickened with mildly restricted leaflet motion.

## 2023-01-02 NOTE — Care Management Important Message (Signed)
Important Message  Patient Details  Name: Dana Horton MRN: [id number] Date of Birth: 02/28/1946   Important Message Given:  Yes - Medicare IM     Dorena Bodo 01/02/2023, 2:40 PM

## 2023-01-02 NOTE — Hospital Course (Signed)
Mrs. Bowlby was admitted to the hospital with the working diagnosis of heart failure decompensation in the setting of valvular heart disease.   77 yo female with the past medical history of atrial fibrillation, CKD, COPD, GERD, hypertension, and OSA who presented with palpitations and generalized weakness. Apparently patient had not been taking metoprolol or apixaban. Because low blood pressure she was brought to the hospital. In the ED her blood pressure was 110/60, 96/63, HR 167, RR 33 and 02 saturation 100%, lungs with decreased breath sounds, bilateral rales and increased work of breathing, heart with S1 and S2 present, irregularly irregular, no gallops, abdomen with no distention and no lower extremity edema.   Chest radiograph with cardiomegaly, bilateral hilar vascular congestion, with small right pleural effusion.   #1 EKG 146 bpm, normal axis, normal intervals, atrial flutter with variable block, no significant ST segment or T wave changes.   Patient was placed on IV furosemide, and IV amiodarone.  Respiratory support with Bipap.   #2 EKG 73 bpm, normal axis, normal intervals, sinus rhythm with no significant ST segment or T wave changes.   Patient was weaned off non invasive mechanical ventilation.  10/08 TEE with no atrial thrombus, severe aortic regurgitation, severe mitral regurgitation, moderate mitral regurgitation. LV systolic function preserved.  10/09 transfer from [facility] to [facility] for cardiac catheterization and cardiothoracic consultation.

## 2023-01-02 NOTE — Assessment & Plan Note (Addendum)
Echocardiogram 65 to 70%, moderate asymmetric left ventricular hypertrophy of the basal septal. RV with preserved systolic function, RVSP 59.3 mmHg. LA with severe dilatation, RA with moderate dilatation, severe mitral regurgitation, moderate tricuspid regurgitation, mild to moderate aortic regurgitation.   10/08 TEE with no atrial thrombus, severe aortic regurgitation, severe mitral regurgitation, moderate mitral regurgitation.  10/09 cardiac catheterization  RV 57/1 PA 54/15 mean 35 PCWP 23  Cardiac output 4,9 and index 3,12 (fick).  Positive postcapillary pulmonary hypertension.  Coronary angiogram with normal coronaries.   Patient not candidate for surgical intervention.   Systolic blood pressure 100 to 90   Continue diuresis with furosemide 40 mg po bid. Metoprolol 12.5 mg po bid Spironolactone 12.5 mg po daily.  When blood pressure more stable to start ARB.

## 2023-01-03 ENCOUNTER — Encounter (HOSPITAL_COMMUNITY): Payer: Self-pay | Admitting: Cardiovascular Disease

## 2023-01-03 ENCOUNTER — Telehealth: Payer: Self-pay

## 2023-01-03 ENCOUNTER — Inpatient Hospital Stay (HOSPITAL_COMMUNITY): Payer: Medicare Other

## 2023-01-03 DIAGNOSIS — I340 Nonrheumatic mitral (valve) insufficiency: Secondary | ICD-10-CM

## 2023-01-03 DIAGNOSIS — I5033 Acute on chronic diastolic (congestive) heart failure: Secondary | ICD-10-CM | POA: Diagnosis not present

## 2023-01-03 LAB — CBC
HCT: 24.5 % — ABNORMAL LOW (ref 36.0–46.0)
Hemoglobin: 7.6 g/dL — ABNORMAL LOW (ref 12.0–15.0)
MCH: 29.9 pg (ref 26.0–34.0)
MCHC: 31 g/dL (ref 30.0–36.0)
MCV: 96.5 fL (ref 80.0–100.0)
Platelets: 158 10*3/uL (ref 150–400)
RBC: 2.54 MIL/uL — ABNORMAL LOW (ref 3.87–5.11)
RDW: 19.5 % — ABNORMAL HIGH (ref 11.5–15.5)
WBC: 7.8 10*3/uL (ref 4.0–10.5)
nRBC: 0 % (ref 0.0–0.2)

## 2023-01-03 LAB — LIPOPROTEIN A (LPA): Lipoprotein (a): 31 nmol/L — ABNORMAL HIGH (ref <75.0)

## 2023-01-03 LAB — BASIC METABOLIC PANEL
Anion gap: 9 (ref 5–15)
BUN: 21 mg/dL (ref 8–23)
CO2: 27 mmol/L (ref 22–32)
Calcium: 8.8 mg/dL — ABNORMAL LOW (ref 8.9–10.3)
Chloride: 103 mmol/L (ref 98–111)
Creatinine, Ser: 1.18 mg/dL — ABNORMAL HIGH (ref 0.44–1.00)
GFR, Estimated: 48 mL/min — ABNORMAL LOW (ref >60)
Glucose, Bld: 120 mg/dL — ABNORMAL HIGH (ref 70–99)
Potassium: 4.1 mmol/L (ref 3.5–5.1)
Sodium: 139 mmol/L (ref 135–145)

## 2023-01-03 MED ORDER — SODIUM CHLORIDE 0.9 % IV BOLUS: 500.0000 mL | Freq: Once | INTRAVENOUS | Status: DC

## 2023-01-03 MED ORDER — SODIUM CHLORIDE 0.9 % IV SOLN
100.0000 mg | Freq: Once | INTRAVENOUS | Status: AC
  Administered 2023-01-03: 100 mg via INTRAVENOUS
  Filled 2023-01-03: qty 5

## 2023-01-03 MED ORDER — FUROSEMIDE 20 MG PO TABS
20.0000 mg | ORAL_TABLET | Freq: Every day | ORAL | Status: DC
  Administered 2023-01-03: 20 mg via ORAL
  Filled 2023-01-03: qty 1

## 2023-01-03 MED ORDER — METOPROLOL TARTRATE 12.5 MG HALF TABLET
12.5000 mg | ORAL_TABLET | Freq: Two times a day (BID) | ORAL | Status: DC
  Administered 2023-01-03 – 2023-01-06 (×7): 12.5 mg via ORAL
  Filled 2023-01-03 (×7): qty 1

## 2023-01-03 MED ORDER — APIXABAN 5 MG PO TABS: 5.0000 mg | ORAL_TABLET | Freq: Two times a day (BID) | ORAL | Status: DC

## 2023-01-03 MED ORDER — SODIUM CHLORIDE 0.9 % IV BOLUS
500.0000 mL | INTRAVENOUS | Status: AC
  Administered 2023-01-03: 500 mL via INTRAVENOUS

## 2023-01-03 MED ORDER — IOHEXOL 350 MG/ML SOLN
50.0000 mL | Freq: Once | INTRAVENOUS | Status: AC | PRN
  Administered 2023-01-03: 50 mL via INTRAVENOUS

## 2023-01-03 MED FILL — Heparin Sodium (Porcine) Inj 1000 Unit/ML: INTRAMUSCULAR | Qty: 10 | Status: AC

## 2023-01-03 NOTE — Progress Notes (Signed)
to right radial loop that did not allow advancement of a catheter  in spite of being able to navigate this with an 014 wire and use balloon assisted tracking.  The procedure was finished via the femoral approach.  Findings Coronary Findings Diagnostic  Dominance: Right  Left Main Vessel is normal in caliber. Vessel is angiographically normal.  Left Anterior Descending Vessel is normal in caliber. Vessel is angiographically normal.  Left Circumflex Vessel is normal in caliber. Vessel is angiographically normal.  Right Coronary Artery Vessel is normal in caliber. Vessel is angiographically normal.  Intervention  No interventions have been documented.   CARDIAC CATHETERIZATION  CARDIAC CATHETERIZATION 04/09/2017  Narrative  The left ventricular systolic function is normal.  LV end diastolic pressure is normal. LVEDP 16 mm Hg.  The left ventricular ejection fraction is greater than 65% by visual estimate.  There is no aortic valve stenosis.  No angiographically apparent CAD.  Continue medical therapy.  Restart heparin  gtt 8 hours after sheath pull for AFib/stroke prevention.  Findings Coronary Findings Diagnostic  Dominance: Right  Left Main Vessel was injected. Vessel is normal in caliber. Vessel is angiographically normal.  Left Anterior Descending Vessel was injected. Vessel is normal in caliber. Vessel is angiographically normal.  Left Circumflex Vessel was injected. Vessel is normal in caliber. Vessel is angiographically normal.  Right Coronary Artery Vessel was injected. Vessel is normal in caliber. Vessel is angiographically normal.  Intervention  No interventions have been documented.     ECHOCARDIOGRAM  ECHOCARDIOGRAM COMPLETE 12/25/2022  Narrative ECHOCARDIOGRAM REPORT    Patient Name:   Dana Horton Date of Exam: 12/25/2022 Medical Rec #:  [id number]                     Height:       63.0 in Accession #:    [id number]                    Weight:       130.1 lb Date of Birth:  13-Oct-1945                    BSA:          1.611 m Patient Age:    77 years                      BP:           102/33 mmHg Patient Gender: F                             HR:           72 bpm. Exam Location:  Inpatient  Procedure: 2D Echo, Cardiac Doppler and Color Doppler  Indications:    CHF  History:        Patient has no prior history of Echocardiogram examinations. CHF, COPD, Arrythmias:Atrial Fibrillation; Risk Factors:Hypertension and Dyslipidemia.  Sonographer:    Melton Krebs RDCS, FE, PE Referring Phys: [id number] JAN A MANSY  IMPRESSIONS   1. Left ventricular ejection fraction, by estimation, is 65 to 70%. The left ventricle has normal function. The left ventricle has no regional wall motion abnormalities. There is moderate asymmetric left ventricular hypertrophy of the basal-septal segment. Left ventricular diastolic parameters are consistent with Grade III diastolic dysfunction (restrictive). 2. Right ventricular systolic function is normal. The right ventricular size  is normal. There is moderately  Area:     2.84 cm   RIGHT VENTRICLE RV S prime:     12.90 cm/s TAPSE (M-mode): 1.7 cm  LEFT ATRIUM             Index        RIGHT ATRIUM           Index LA diam:        4.30 cm 2.67 cm/m   RA Area:     20.40 cm LA Vol (A2C):   96.4 ml 59.86 ml/m  RA Volume:   56.70 ml  35.21  ml/m LA Vol (A4C):   85.3 ml 52.96 ml/m LA Biplane Vol: 91.9 ml 57.06 ml/m AORTIC VALVE AV Area (Vmax):    2.54 cm AV Area (Vmean):   2.30 cm AV Area (VTI):     2.45 cm AV Vmax:           203.00 cm/s AV Vmean:          143.000 cm/s AV VTI:            0.426 m AV Peak Grad:      16.5 mmHg AV Mean Grad:      9.0 mmHg LVOT Vmax:         182.00 cm/s LVOT Vmean:        116.000 cm/s LVOT VTI:          0.368 m LVOT/AV VTI ratio: 0.86 AI PHT:            270 msec  AORTA Ao Root diam: 2.60 cm  MITRAL VALVE                TRICUSPID VALVE MV Area (PHT): 2.79 cm     TR Peak grad:   49.3 mmHg MV Decel Time: 272 msec     TR Vmax:        351.00 cm/s MV E velocity: 172.00 cm/s MV A velocity: 49.90 cm/s   SHUNTS MV E/A ratio:  3.45         Systemic VTI:  0.37 m Systemic Diam: 1.90 cm  Arvilla Meres MD Electronically signed by Arvilla Meres MD Signature Date/Time: 12/25/2022/10:16:24 AM    Final   TEE  ECHO TEE 01/01/2023  Narrative TRANSESOPHOGEAL ECHO REPORT    Patient Name:   Dana Horton Date of Exam: 01/01/2023 Medical Rec #:  [id number]                     Height:       63.0 in Accession #:    [id number]                    Weight:       135.6 lb Date of Birth:  January 21, 1946                    BSA:          1.639 m Patient Age:    77 years                      BP:           116/52 mmHg Patient Gender: F                             HR:           88 bpm. Exam Location:  Inpatient  Procedure: Transesophageal  Area:     2.84 cm   RIGHT VENTRICLE RV S prime:     12.90 cm/s TAPSE (M-mode): 1.7 cm  LEFT ATRIUM             Index        RIGHT ATRIUM           Index LA diam:        4.30 cm 2.67 cm/m   RA Area:     20.40 cm LA Vol (A2C):   96.4 ml 59.86 ml/m  RA Volume:   56.70 ml  35.21  ml/m LA Vol (A4C):   85.3 ml 52.96 ml/m LA Biplane Vol: 91.9 ml 57.06 ml/m AORTIC VALVE AV Area (Vmax):    2.54 cm AV Area (Vmean):   2.30 cm AV Area (VTI):     2.45 cm AV Vmax:           203.00 cm/s AV Vmean:          143.000 cm/s AV VTI:            0.426 m AV Peak Grad:      16.5 mmHg AV Mean Grad:      9.0 mmHg LVOT Vmax:         182.00 cm/s LVOT Vmean:        116.000 cm/s LVOT VTI:          0.368 m LVOT/AV VTI ratio: 0.86 AI PHT:            270 msec  AORTA Ao Root diam: 2.60 cm  MITRAL VALVE                TRICUSPID VALVE MV Area (PHT): 2.79 cm     TR Peak grad:   49.3 mmHg MV Decel Time: 272 msec     TR Vmax:        351.00 cm/s MV E velocity: 172.00 cm/s MV A velocity: 49.90 cm/s   SHUNTS MV E/A ratio:  3.45         Systemic VTI:  0.37 m Systemic Diam: 1.90 cm  Arvilla Meres MD Electronically signed by Arvilla Meres MD Signature Date/Time: 12/25/2022/10:16:24 AM    Final   TEE  ECHO TEE 01/01/2023  Narrative TRANSESOPHOGEAL ECHO REPORT    Patient Name:   Dana Horton Date of Exam: 01/01/2023 Medical Rec #:  [id number]                     Height:       63.0 in Accession #:    [id number]                    Weight:       135.6 lb Date of Birth:  January 21, 1946                    BSA:          1.639 m Patient Age:    77 years                      BP:           116/52 mmHg Patient Gender: F                             HR:           88 bpm. Exam Location:  Inpatient  Procedure: Transesophageal  to right radial loop that did not allow advancement of a catheter  in spite of being able to navigate this with an 014 wire and use balloon assisted tracking.  The procedure was finished via the femoral approach.  Findings Coronary Findings Diagnostic  Dominance: Right  Left Main Vessel is normal in caliber. Vessel is angiographically normal.  Left Anterior Descending Vessel is normal in caliber. Vessel is angiographically normal.  Left Circumflex Vessel is normal in caliber. Vessel is angiographically normal.  Right Coronary Artery Vessel is normal in caliber. Vessel is angiographically normal.  Intervention  No interventions have been documented.   CARDIAC CATHETERIZATION  CARDIAC CATHETERIZATION 04/09/2017  Narrative  The left ventricular systolic function is normal.  LV end diastolic pressure is normal. LVEDP 16 mm Hg.  The left ventricular ejection fraction is greater than 65% by visual estimate.  There is no aortic valve stenosis.  No angiographically apparent CAD.  Continue medical therapy.  Restart heparin  gtt 8 hours after sheath pull for AFib/stroke prevention.  Findings Coronary Findings Diagnostic  Dominance: Right  Left Main Vessel was injected. Vessel is normal in caliber. Vessel is angiographically normal.  Left Anterior Descending Vessel was injected. Vessel is normal in caliber. Vessel is angiographically normal.  Left Circumflex Vessel was injected. Vessel is normal in caliber. Vessel is angiographically normal.  Right Coronary Artery Vessel was injected. Vessel is normal in caliber. Vessel is angiographically normal.  Intervention  No interventions have been documented.     ECHOCARDIOGRAM  ECHOCARDIOGRAM COMPLETE 12/25/2022  Narrative ECHOCARDIOGRAM REPORT    Patient Name:   Dana Horton Date of Exam: 12/25/2022 Medical Rec #:  [id number]                     Height:       63.0 in Accession #:    [id number]                    Weight:       130.1 lb Date of Birth:  13-Oct-1945                    BSA:          1.611 m Patient Age:    77 years                      BP:           102/33 mmHg Patient Gender: F                             HR:           72 bpm. Exam Location:  Inpatient  Procedure: 2D Echo, Cardiac Doppler and Color Doppler  Indications:    CHF  History:        Patient has no prior history of Echocardiogram examinations. CHF, COPD, Arrythmias:Atrial Fibrillation; Risk Factors:Hypertension and Dyslipidemia.  Sonographer:    Melton Krebs RDCS, FE, PE Referring Phys: [id number] JAN A MANSY  IMPRESSIONS   1. Left ventricular ejection fraction, by estimation, is 65 to 70%. The left ventricle has normal function. The left ventricle has no regional wall motion abnormalities. There is moderate asymmetric left ventricular hypertrophy of the basal-septal segment. Left ventricular diastolic parameters are consistent with Grade III diastolic dysfunction (restrictive). 2. Right ventricular systolic function is normal. The right ventricular size  is normal. There is moderately  to right radial loop that did not allow advancement of a catheter  in spite of being able to navigate this with an 014 wire and use balloon assisted tracking.  The procedure was finished via the femoral approach.  Findings Coronary Findings Diagnostic  Dominance: Right  Left Main Vessel is normal in caliber. Vessel is angiographically normal.  Left Anterior Descending Vessel is normal in caliber. Vessel is angiographically normal.  Left Circumflex Vessel is normal in caliber. Vessel is angiographically normal.  Right Coronary Artery Vessel is normal in caliber. Vessel is angiographically normal.  Intervention  No interventions have been documented.   CARDIAC CATHETERIZATION  CARDIAC CATHETERIZATION 04/09/2017  Narrative  The left ventricular systolic function is normal.  LV end diastolic pressure is normal. LVEDP 16 mm Hg.  The left ventricular ejection fraction is greater than 65% by visual estimate.  There is no aortic valve stenosis.  No angiographically apparent CAD.  Continue medical therapy.  Restart heparin  gtt 8 hours after sheath pull for AFib/stroke prevention.  Findings Coronary Findings Diagnostic  Dominance: Right  Left Main Vessel was injected. Vessel is normal in caliber. Vessel is angiographically normal.  Left Anterior Descending Vessel was injected. Vessel is normal in caliber. Vessel is angiographically normal.  Left Circumflex Vessel was injected. Vessel is normal in caliber. Vessel is angiographically normal.  Right Coronary Artery Vessel was injected. Vessel is normal in caliber. Vessel is angiographically normal.  Intervention  No interventions have been documented.     ECHOCARDIOGRAM  ECHOCARDIOGRAM COMPLETE 12/25/2022  Narrative ECHOCARDIOGRAM REPORT    Patient Name:   Dana Horton Date of Exam: 12/25/2022 Medical Rec #:  [id number]                     Height:       63.0 in Accession #:    [id number]                    Weight:       130.1 lb Date of Birth:  13-Oct-1945                    BSA:          1.611 m Patient Age:    77 years                      BP:           102/33 mmHg Patient Gender: F                             HR:           72 bpm. Exam Location:  Inpatient  Procedure: 2D Echo, Cardiac Doppler and Color Doppler  Indications:    CHF  History:        Patient has no prior history of Echocardiogram examinations. CHF, COPD, Arrythmias:Atrial Fibrillation; Risk Factors:Hypertension and Dyslipidemia.  Sonographer:    Melton Krebs RDCS, FE, PE Referring Phys: [id number] JAN A MANSY  IMPRESSIONS   1. Left ventricular ejection fraction, by estimation, is 65 to 70%. The left ventricle has normal function. The left ventricle has no regional wall motion abnormalities. There is moderate asymmetric left ventricular hypertrophy of the basal-septal segment. Left ventricular diastolic parameters are consistent with Grade III diastolic dysfunction (restrictive). 2. Right ventricular systolic function is normal. The right ventricular size  is normal. There is moderately  to right radial loop that did not allow advancement of a catheter  in spite of being able to navigate this with an 014 wire and use balloon assisted tracking.  The procedure was finished via the femoral approach.  Findings Coronary Findings Diagnostic  Dominance: Right  Left Main Vessel is normal in caliber. Vessel is angiographically normal.  Left Anterior Descending Vessel is normal in caliber. Vessel is angiographically normal.  Left Circumflex Vessel is normal in caliber. Vessel is angiographically normal.  Right Coronary Artery Vessel is normal in caliber. Vessel is angiographically normal.  Intervention  No interventions have been documented.   CARDIAC CATHETERIZATION  CARDIAC CATHETERIZATION 04/09/2017  Narrative  The left ventricular systolic function is normal.  LV end diastolic pressure is normal. LVEDP 16 mm Hg.  The left ventricular ejection fraction is greater than 65% by visual estimate.  There is no aortic valve stenosis.  No angiographically apparent CAD.  Continue medical therapy.  Restart heparin  gtt 8 hours after sheath pull for AFib/stroke prevention.  Findings Coronary Findings Diagnostic  Dominance: Right  Left Main Vessel was injected. Vessel is normal in caliber. Vessel is angiographically normal.  Left Anterior Descending Vessel was injected. Vessel is normal in caliber. Vessel is angiographically normal.  Left Circumflex Vessel was injected. Vessel is normal in caliber. Vessel is angiographically normal.  Right Coronary Artery Vessel was injected. Vessel is normal in caliber. Vessel is angiographically normal.  Intervention  No interventions have been documented.     ECHOCARDIOGRAM  ECHOCARDIOGRAM COMPLETE 12/25/2022  Narrative ECHOCARDIOGRAM REPORT    Patient Name:   Dana Horton Date of Exam: 12/25/2022 Medical Rec #:  [id number]                     Height:       63.0 in Accession #:    [id number]                    Weight:       130.1 lb Date of Birth:  13-Oct-1945                    BSA:          1.611 m Patient Age:    77 years                      BP:           102/33 mmHg Patient Gender: F                             HR:           72 bpm. Exam Location:  Inpatient  Procedure: 2D Echo, Cardiac Doppler and Color Doppler  Indications:    CHF  History:        Patient has no prior history of Echocardiogram examinations. CHF, COPD, Arrythmias:Atrial Fibrillation; Risk Factors:Hypertension and Dyslipidemia.  Sonographer:    Melton Krebs RDCS, FE, PE Referring Phys: [id number] JAN A MANSY  IMPRESSIONS   1. Left ventricular ejection fraction, by estimation, is 65 to 70%. The left ventricle has normal function. The left ventricle has no regional wall motion abnormalities. There is moderate asymmetric left ventricular hypertrophy of the basal-septal segment. Left ventricular diastolic parameters are consistent with Grade III diastolic dysfunction (restrictive). 2. Right ventricular systolic function is normal. The right ventricular size  is normal. There is moderately  to right radial loop that did not allow advancement of a catheter  in spite of being able to navigate this with an 014 wire and use balloon assisted tracking.  The procedure was finished via the femoral approach.  Findings Coronary Findings Diagnostic  Dominance: Right  Left Main Vessel is normal in caliber. Vessel is angiographically normal.  Left Anterior Descending Vessel is normal in caliber. Vessel is angiographically normal.  Left Circumflex Vessel is normal in caliber. Vessel is angiographically normal.  Right Coronary Artery Vessel is normal in caliber. Vessel is angiographically normal.  Intervention  No interventions have been documented.   CARDIAC CATHETERIZATION  CARDIAC CATHETERIZATION 04/09/2017  Narrative  The left ventricular systolic function is normal.  LV end diastolic pressure is normal. LVEDP 16 mm Hg.  The left ventricular ejection fraction is greater than 65% by visual estimate.  There is no aortic valve stenosis.  No angiographically apparent CAD.  Continue medical therapy.  Restart heparin  gtt 8 hours after sheath pull for AFib/stroke prevention.  Findings Coronary Findings Diagnostic  Dominance: Right  Left Main Vessel was injected. Vessel is normal in caliber. Vessel is angiographically normal.  Left Anterior Descending Vessel was injected. Vessel is normal in caliber. Vessel is angiographically normal.  Left Circumflex Vessel was injected. Vessel is normal in caliber. Vessel is angiographically normal.  Right Coronary Artery Vessel was injected. Vessel is normal in caliber. Vessel is angiographically normal.  Intervention  No interventions have been documented.     ECHOCARDIOGRAM  ECHOCARDIOGRAM COMPLETE 12/25/2022  Narrative ECHOCARDIOGRAM REPORT    Patient Name:   Dana Horton Date of Exam: 12/25/2022 Medical Rec #:  [id number]                     Height:       63.0 in Accession #:    [id number]                    Weight:       130.1 lb Date of Birth:  13-Oct-1945                    BSA:          1.611 m Patient Age:    77 years                      BP:           102/33 mmHg Patient Gender: F                             HR:           72 bpm. Exam Location:  Inpatient  Procedure: 2D Echo, Cardiac Doppler and Color Doppler  Indications:    CHF  History:        Patient has no prior history of Echocardiogram examinations. CHF, COPD, Arrythmias:Atrial Fibrillation; Risk Factors:Hypertension and Dyslipidemia.  Sonographer:    Melton Krebs RDCS, FE, PE Referring Phys: [id number] JAN A MANSY  IMPRESSIONS   1. Left ventricular ejection fraction, by estimation, is 65 to 70%. The left ventricle has normal function. The left ventricle has no regional wall motion abnormalities. There is moderate asymmetric left ventricular hypertrophy of the basal-septal segment. Left ventricular diastolic parameters are consistent with Grade III diastolic dysfunction (restrictive). 2. Right ventricular systolic function is normal. The right ventricular size  is normal. There is moderately

## 2023-01-03 NOTE — Progress Notes (Addendum)
Hypotensive episodes: Bedside nurse reported that patient blood pressure is 91/47. Per chart review patient has been admitted for acute on chronic diastolic heart failure.  Currently patient is on Lasix 40 mg p.o. twice daily and metoprolol 12.5 mg twice daily, spironolactone 12.5 mg daily. -In the setting of MAP 59 recommended bedside nurse to hold morning dose of Lasix, spironolactone, metoprolol and Jardiance. -Once blood pressure will be improved can resume blood pressure regimen accordingly.  Deferring daytime provider to follow-up with blood pressure and resumption of blood pressure regimen accordingly. -Giving NS bolus 500 cc over 2-hours and continue to monitor blood pressure very closely. - Continue cardiac monitoring   Tereasa Coop, MD [facility] 01/03/2023, 5:29 AM

## 2023-01-03 NOTE — Progress Notes (Addendum)
PHARMACY - ANTICOAGULATION CONSULT NOTE  Pharmacy Consult for Eliquis Indication: atrial fibrillation  Allergies  Allergen Reactions   Albuterol Palpitations   Lisinopril Cough    Patient Measurements: Height: 5\' 3"  (160 cm) Weight: 56.2 kg (123 lb 14.4 oz) IBW/kg (Calculated) : 52.4  Vital Signs: Temp: 97.6 F (36.4 C) (10/10 0520) Temp Source: Oral (10/10 0520) BP: 91/47 (10/10 0520) Pulse Rate: 66 (10/10 0520)  Labs: Recent Labs    01/01/23 0502 01/01/23 2113 01/02/23 0739 01/02/23 1546 01/02/23 1548 01/03/23 0314  HGB 7.8*  --  8.7* 8.2* 8.5* 7.6*  HCT 25.7*  --  28.2* 24.0* 25.0* 24.5*  PLT 142*  --  172  --   --  158  APTT  --  41* 53*  --   --   --   HEPARINUNFRC  --  >1.10*  --   --   --   --   CREATININE 0.71  --  0.93  --   --  1.18*    Estimated Creatinine Clearance: 33.6 mL/min (A) (by C-G formula based on SCr of 1.18 mg/dL (H)).   Medical History: Past Medical History:  Diagnosis Date   Atrial fibrillation with rapid ventricular response (HCC) 04/10/2017   Bladder tumor 08/06/2022   BMI 28.0-28.9,adult 05/21/2022   Bronchitis 03/07/2022   Chronic anticoagulation 06/10/2019   Eliquis   Chronic diastolic heart failure (HCC)    Chronic obstructive pulmonary disease (HCC) 05/21/2022   Cigarette smoker 05/21/2022   CKD (chronic kidney disease) stage 3, GFR 30-59 ml/min (HCC)    Demand ischemia (HCC) 09/25/2016   Normal coronaries Jan 2019 when admitted with AF with RVR and chest pain   Dysrhythmia    a-fib   Essential hypertension    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 06/29/2022   GERD (gastroesophageal reflux disease)    HCAP (healthcare-associated pneumonia) 04/10/2017   History of bladder cancer    Hypercholesterolemia 05/21/2022   Iron deficiency anemia due to chronic blood loss 06/29/2022   Lung nodule    OSA on CPAP 04/08/2017   Osteopenia 05/21/2022   Osteoporosis    PAF (paroxysmal atrial  fibrillation) (HCC) 09/24/2016   CHADS2 vasc=3   Sleep apnea    cpap  setting at 2.5 per patient    Stage 3a chronic kidney disease (HCC) 05/21/2022   Symptomatic anemia 08/07/2022   Tobacco abuse     Medications:  Scheduled:   [START ON 01/06/2023] amiodarone  200 mg Oral Daily   amiodarone  400 mg Oral BID   budesonide (PULMICORT) nebulizer solution  0.5 mg Nebulization BID   docusate sodium  100 mg Oral Daily   ferrous sulfate  325 mg Oral Q breakfast   fluticasone  2 spray Each Nare Daily   levalbuterol  0.63 mg Nebulization BID   montelukast  10 mg Oral QPM   pantoprazole  40 mg Oral Daily   pregabalin  50 mg Oral BID   rosuvastatin  5 mg Oral QODAY   sodium chloride flush  3 mL Intravenous Q12H    Assessment: 77 yo F with history of Afib (on apixaban 5mg  BID), HFpEF, COPD, HLD, aortic atherosclerosis, and PAD presents with mixed and multiple valve disease. Patient transitioned to heparin for procedures and now pharmacy consulted to restart PTA Eliquis.  Hgb 8.7 > 7.6, pltc stable.  No overt bleeding per RN.    Plan:  START Eliquis 5 mg twice daily  10/10 AM - Eliquis held per MD for  anemia  Trixie Rude, PharmD Clinical Pharmacist 01/03/2023  7:53 AM

## 2023-01-03 NOTE — Progress Notes (Signed)
Heart Failure Navigator Progress Note  Assessed for Heart & Vascular TOC clinic readiness.  Patient does not meet criteria due to EF 65-70%, No HF TOC per Dr. Conan Bowens, has a structural heart appointment with Dr. Excell Seltzer on 01/09/2023. .   Navigator will sign off at this time.   Rhae Hammock, BSN, Scientist, clinical (histocompatibility and immunogenetics) Only

## 2023-01-03 NOTE — Progress Notes (Signed)
   01/03/23 2241  BiPAP/CPAP/SIPAP  $ Non-Invasive Home Ventilator  Subsequent  $ Face Mask Medium Yes  BiPAP/CPAP/SIPAP Pt Type Adult  BiPAP/CPAP/SIPAP Resmed  Mask Type Full face mask  Mask Size Medium  EPAP 10 cmH2O  PEEP 10 cmH20  Flow Rate 2 lpm  Patient Home Equipment Yes  Auto Titrate No  Nasal massage performed Yes  Safety Check Completed by RT for Home Unit Yes, no issues noted

## 2023-01-03 NOTE — Progress Notes (Addendum)
moderate pulmonary hypertension, moderately elevated wedge pressure with giant V waves and normal cardiac output. Recommendations: There is hemodynamic evidence of severe mitral regurgitation. Continue oral diuresis and evaluation for mitral valve repair. Avoid catheterization via the right radial artery in the future due to right radial loop that did not allow advancement of a catheter  in spite of being able to navigate this with an 014 wire and use balloon assisted tracking.  The procedure was finished via the femoral approach.  Result Date: 01/02/2023 1.  Normal coronary arteries. 2.  Normal LV systolic function. 3.  Right heart catheterization showed normal RA pressure, moderate pulmonary hypertension, moderately elevated wedge pressure with giant V waves and normal cardiac output. Recommendations: There is hemodynamic evidence of severe mitral  regurgitation. Continue oral diuresis and evaluation for mitral valve repair.   ECHO TEE  Result Date: 01/01/2023    TRANSESOPHOGEAL ECHO REPORT   Patient Name:   Dana Horton Date of Exam: 01/01/2023 Medical Rec #:  [id number]                     Height:       63.0 in Accession #:    [id number]                    Weight:       135.6 lb Date of Birth:  1946/02/26                    BSA:          1.639 m Patient Age:    77 years                      BP:           116/52 mmHg Patient Gender: F                             HR:           88 bpm. Exam Location:  Inpatient Procedure: Transesophageal Echo, 3D Echo, Color Doppler and Cardiac Doppler Indications:     Mitral Regurgitation i34.0  History:         Patient has prior history of Echocardiogram examinations, most                  recent 12/25/2022. COPD, Arrythmias:Atrial Fibrillation; Risk                  Factors:Hypertension, Sleep Apnea and Dyslipidemia.  Sonographer:     Irving Burton Senior RDCS Referring Phys:  [id number] BRIDGETTE CHRISTOPHER Diagnosing Phys: Jodelle Red MD PROCEDURE: After discussion of the risks and benefits of a TEE, an informed consent was obtained from the patient. The transesophogeal probe was passed without difficulty through the esophogus of the patient. Sedation performed by different physician. The patient was monitored while under deep sedation. Anesthestetic sedation was provided intravenously by Anesthesiology: 97mg  of Propofol, 60mg  of Lidocaine. Image quality was good. The patient developed no complications during the procedure.  IMPRESSIONS  1. Left ventricular ejection fraction, by estimation, is 65 to 70%. The left ventricle has normal function.  2. Right ventricular systolic function is normal. The right ventricular size is normal. There is severely elevated pulmonary artery systolic pressure. The estimated right ventricular systolic pressure is 60.7 mmHg.  3. Left atrial size was severely dilated. No  left atrial/left atrial appendage thrombus was detected.  moderate pulmonary hypertension, moderately elevated wedge pressure with giant V waves and normal cardiac output. Recommendations: There is hemodynamic evidence of severe mitral regurgitation. Continue oral diuresis and evaluation for mitral valve repair. Avoid catheterization via the right radial artery in the future due to right radial loop that did not allow advancement of a catheter  in spite of being able to navigate this with an 014 wire and use balloon assisted tracking.  The procedure was finished via the femoral approach.  Result Date: 01/02/2023 1.  Normal coronary arteries. 2.  Normal LV systolic function. 3.  Right heart catheterization showed normal RA pressure, moderate pulmonary hypertension, moderately elevated wedge pressure with giant V waves and normal cardiac output. Recommendations: There is hemodynamic evidence of severe mitral  regurgitation. Continue oral diuresis and evaluation for mitral valve repair.   ECHO TEE  Result Date: 01/01/2023    TRANSESOPHOGEAL ECHO REPORT   Patient Name:   Dana Horton Date of Exam: 01/01/2023 Medical Rec #:  [id number]                     Height:       63.0 in Accession #:    [id number]                    Weight:       135.6 lb Date of Birth:  1946/02/26                    BSA:          1.639 m Patient Age:    77 years                      BP:           116/52 mmHg Patient Gender: F                             HR:           88 bpm. Exam Location:  Inpatient Procedure: Transesophageal Echo, 3D Echo, Color Doppler and Cardiac Doppler Indications:     Mitral Regurgitation i34.0  History:         Patient has prior history of Echocardiogram examinations, most                  recent 12/25/2022. COPD, Arrythmias:Atrial Fibrillation; Risk                  Factors:Hypertension, Sleep Apnea and Dyslipidemia.  Sonographer:     Irving Burton Senior RDCS Referring Phys:  [id number] BRIDGETTE CHRISTOPHER Diagnosing Phys: Jodelle Red MD PROCEDURE: After discussion of the risks and benefits of a TEE, an informed consent was obtained from the patient. The transesophogeal probe was passed without difficulty through the esophogus of the patient. Sedation performed by different physician. The patient was monitored while under deep sedation. Anesthestetic sedation was provided intravenously by Anesthesiology: 97mg  of Propofol, 60mg  of Lidocaine. Image quality was good. The patient developed no complications during the procedure.  IMPRESSIONS  1. Left ventricular ejection fraction, by estimation, is 65 to 70%. The left ventricle has normal function.  2. Right ventricular systolic function is normal. The right ventricular size is normal. There is severely elevated pulmonary artery systolic pressure. The estimated right ventricular systolic pressure is 60.7 mmHg.  3. Left atrial size was severely dilated. No  left atrial/left atrial appendage thrombus was detected.  moderate pulmonary hypertension, moderately elevated wedge pressure with giant V waves and normal cardiac output. Recommendations: There is hemodynamic evidence of severe mitral regurgitation. Continue oral diuresis and evaluation for mitral valve repair. Avoid catheterization via the right radial artery in the future due to right radial loop that did not allow advancement of a catheter  in spite of being able to navigate this with an 014 wire and use balloon assisted tracking.  The procedure was finished via the femoral approach.  Result Date: 01/02/2023 1.  Normal coronary arteries. 2.  Normal LV systolic function. 3.  Right heart catheterization showed normal RA pressure, moderate pulmonary hypertension, moderately elevated wedge pressure with giant V waves and normal cardiac output. Recommendations: There is hemodynamic evidence of severe mitral  regurgitation. Continue oral diuresis and evaluation for mitral valve repair.   ECHO TEE  Result Date: 01/01/2023    TRANSESOPHOGEAL ECHO REPORT   Patient Name:   Dana Horton Date of Exam: 01/01/2023 Medical Rec #:  [id number]                     Height:       63.0 in Accession #:    [id number]                    Weight:       135.6 lb Date of Birth:  1946/02/26                    BSA:          1.639 m Patient Age:    77 years                      BP:           116/52 mmHg Patient Gender: F                             HR:           88 bpm. Exam Location:  Inpatient Procedure: Transesophageal Echo, 3D Echo, Color Doppler and Cardiac Doppler Indications:     Mitral Regurgitation i34.0  History:         Patient has prior history of Echocardiogram examinations, most                  recent 12/25/2022. COPD, Arrythmias:Atrial Fibrillation; Risk                  Factors:Hypertension, Sleep Apnea and Dyslipidemia.  Sonographer:     Irving Burton Senior RDCS Referring Phys:  [id number] BRIDGETTE CHRISTOPHER Diagnosing Phys: Jodelle Red MD PROCEDURE: After discussion of the risks and benefits of a TEE, an informed consent was obtained from the patient. The transesophogeal probe was passed without difficulty through the esophogus of the patient. Sedation performed by different physician. The patient was monitored while under deep sedation. Anesthestetic sedation was provided intravenously by Anesthesiology: 97mg  of Propofol, 60mg  of Lidocaine. Image quality was good. The patient developed no complications during the procedure.  IMPRESSIONS  1. Left ventricular ejection fraction, by estimation, is 65 to 70%. The left ventricle has normal function.  2. Right ventricular systolic function is normal. The right ventricular size is normal. There is severely elevated pulmonary artery systolic pressure. The estimated right ventricular systolic pressure is 60.7 mmHg.  3. Left atrial size was severely dilated. No  left atrial/left atrial appendage thrombus was detected.  PROGRESS NOTE    Dana Horton  ZOX:[id number] DOB: 28-Oct-1945 DOA: 12/24/2022 PCP: Crist Fat, MD  76/F w atrial fibrillation, CKD, COPD, GERD, hypertension, and OSA who presented with palpitations and generalized weakness. Apparently patient had not been taking metoprolol or apixaban. Because low blood pressure she was brought to the hospital. In the ED her blood pressure was 110/60, 96/63,  in Afib w/ bilateral rales and increased work of breathing,  CXR w/cardiomegaly, bilateral hilar vascular congestion, with small right pleural effusion.  -Patient was placed on IV furosemide, IV amiodarone and  Bipap.   -weaned off non invasive mechanical ventilation.  10/8 TEE with no atrial thrombus, severe aortic regurgitation, severe mitral regurgitation, moderate TR, LV systolic function preserved.  10/09 transfer from [facility] to [facility] for cardiac catheterization and cardiothoracic consultation.  RHC/LHC with normal cors, elevated wedge, hemodynamics of severe MR     Subjective: Feels okay, denies any complaints this morning   Assessment and Plan:  Acute on chronic diastolic CHF  Severe mitral regurgitation Severe aortic insufficiency -Echo- EF 65 to 70%, moderate asymmetric LVH, RV with preserved, severe mitral regurgitation, moderate tricuspid regurgitation, mod AI -10/08 TEE with no atrial thrombus, severe aortic regurgitation, severe mitral regurgitation, moderate TR -10/9 : RHC/LHC with normal coronaries, hemodynamics suggestive of severe mitral regurgitation -TCTS eval completed, not candidate for surgery -Vol status imrpovving, down 7 LB Lasix changed to 20 Mg daily, continue metoprolol -SGLT2i and MRA held for hypotension -FU CT chest, if no other issues eval for mTEER -Wean O2  Paroxysmal atrial fibrillation with RVR (HCC) Continue amiodarone, metoprolol and apixaban.   Hypertensive urgency - weaned off nitroglycerin gtt -Continue with metoprolol.   Stage 3a  chronic kidney disease (HCC) Hypokalemia,  -continue furosemide,   -Avoid hypotension  Chronic obstructive pulmonary disease (HCC) No signs of acute exacerbation.   OSA on CPAP Home Cpap.   Acute on chronic anemia -Hemoglobin now down to 7.6, anemia panel this admission suggestive of chronic disease however previously throughout this year was indicative of iron deficiency  -Likely a combination of both, will add IV iron today, recheck CBC in a.m., may benefit from transfusion prior to discharge if hemoglobin remains low  Peripheral neuropathy Continue pregabalin.   GERD without esophagitis Continue pantoprazole.   DVT prophylaxis:SCDs Code Status: DNR Family Communication: No family at bedside Disposition Plan: Home tomorrow if stable  Consultants:    Procedures:   Antimicrobials:    Objective: Vitals:   01/02/23 1943 01/02/23 2039 01/02/23 2347 01/03/23 0117  BP: (!) 110/44  98/68   Pulse: 76 71 71 71  Resp: 18  17   Temp: 97.6 F (36.4 C)  97.7 F (36.5 C)   TempSrc: Oral  Axillary   SpO2: 90% (!) 84% 97% 98%  Weight:      Height:        Intake/Output Summary (Last 24 hours) at 01/03/2023 0521 Last data filed at 01/02/2023 2350 Gross per 24 hour  Intake 360 ml  Output 500 ml  Net -140 ml   Filed Weights   12/31/22 0500 01/01/23 2107 01/02/23 0441  Weight: 61.5 kg 57.8 kg 56.6 kg    Examination:  Gen: Awake, Alert, Oriented X 3,  HEENT: no JVD Lungs: Good air movement bilaterally, CTAB CVS: S1S2/RRR, systolic murmur Abd: soft, Non tender, non distended, BS present Extremities: No edema Skin: no new rashes on exposed skin    Data Reviewed:   CBC: Recent Labs  Lab 12/29/22 [phone]  PROGRESS NOTE    Dana Horton  ZOX:[id number] DOB: 28-Oct-1945 DOA: 12/24/2022 PCP: Crist Fat, MD  76/F w atrial fibrillation, CKD, COPD, GERD, hypertension, and OSA who presented with palpitations and generalized weakness. Apparently patient had not been taking metoprolol or apixaban. Because low blood pressure she was brought to the hospital. In the ED her blood pressure was 110/60, 96/63,  in Afib w/ bilateral rales and increased work of breathing,  CXR w/cardiomegaly, bilateral hilar vascular congestion, with small right pleural effusion.  -Patient was placed on IV furosemide, IV amiodarone and  Bipap.   -weaned off non invasive mechanical ventilation.  10/8 TEE with no atrial thrombus, severe aortic regurgitation, severe mitral regurgitation, moderate TR, LV systolic function preserved.  10/09 transfer from [facility] to [facility] for cardiac catheterization and cardiothoracic consultation.  RHC/LHC with normal cors, elevated wedge, hemodynamics of severe MR     Subjective: Feels okay, denies any complaints this morning   Assessment and Plan:  Acute on chronic diastolic CHF  Severe mitral regurgitation Severe aortic insufficiency -Echo- EF 65 to 70%, moderate asymmetric LVH, RV with preserved, severe mitral regurgitation, moderate tricuspid regurgitation, mod AI -10/08 TEE with no atrial thrombus, severe aortic regurgitation, severe mitral regurgitation, moderate TR -10/9 : RHC/LHC with normal coronaries, hemodynamics suggestive of severe mitral regurgitation -TCTS eval completed, not candidate for surgery -Vol status imrpovving, down 7 LB Lasix changed to 20 Mg daily, continue metoprolol -SGLT2i and MRA held for hypotension -FU CT chest, if no other issues eval for mTEER -Wean O2  Paroxysmal atrial fibrillation with RVR (HCC) Continue amiodarone, metoprolol and apixaban.   Hypertensive urgency - weaned off nitroglycerin gtt -Continue with metoprolol.   Stage 3a  chronic kidney disease (HCC) Hypokalemia,  -continue furosemide,   -Avoid hypotension  Chronic obstructive pulmonary disease (HCC) No signs of acute exacerbation.   OSA on CPAP Home Cpap.   Acute on chronic anemia -Hemoglobin now down to 7.6, anemia panel this admission suggestive of chronic disease however previously throughout this year was indicative of iron deficiency  -Likely a combination of both, will add IV iron today, recheck CBC in a.m., may benefit from transfusion prior to discharge if hemoglobin remains low  Peripheral neuropathy Continue pregabalin.   GERD without esophagitis Continue pantoprazole.   DVT prophylaxis:SCDs Code Status: DNR Family Communication: No family at bedside Disposition Plan: Home tomorrow if stable  Consultants:    Procedures:   Antimicrobials:    Objective: Vitals:   01/02/23 1943 01/02/23 2039 01/02/23 2347 01/03/23 0117  BP: (!) 110/44  98/68   Pulse: 76 71 71 71  Resp: 18  17   Temp: 97.6 F (36.4 C)  97.7 F (36.5 C)   TempSrc: Oral  Axillary   SpO2: 90% (!) 84% 97% 98%  Weight:      Height:        Intake/Output Summary (Last 24 hours) at 01/03/2023 0521 Last data filed at 01/02/2023 2350 Gross per 24 hour  Intake 360 ml  Output 500 ml  Net -140 ml   Filed Weights   12/31/22 0500 01/01/23 2107 01/02/23 0441  Weight: 61.5 kg 57.8 kg 56.6 kg    Examination:  Gen: Awake, Alert, Oriented X 3,  HEENT: no JVD Lungs: Good air movement bilaterally, CTAB CVS: S1S2/RRR, systolic murmur Abd: soft, Non tender, non distended, BS present Extremities: No edema Skin: no new rashes on exposed skin    Data Reviewed:   CBC: Recent Labs  Lab 12/29/22 [phone]

## 2023-01-03 NOTE — Telephone Encounter (Signed)
Per [employer],  "This valve appears to be a challenging but doable MitraClip case. In the and SAXB and Bicaval views, the fossa looks reasonable for a transseptal puncture. The LA dimensions are large enough for steering and straddle of the Clip Delivery System. The MR is caused by a P2/P3 flail and what appears to be an A2/A3 cleft and possibly P3 cleft as well. The posterior leaflet measures >42mm in the LVOT grasping views. Gradient & MVA not measured. Based on this information, I would recommend placing an NT/W near A3/P3 and another NTW or XTW just lateral of A2/P2 where the leaflets are coapting, assessing for gradient & residual.   *Clips will likely not be parallel due to the anatomy of the valve. Plan to have residual MR in the near center of the valve where cleft(s) are located   *TR noted"

## 2023-01-04 DIAGNOSIS — I5033 Acute on chronic diastolic (congestive) heart failure: Secondary | ICD-10-CM | POA: Diagnosis not present

## 2023-01-04 LAB — CBC
HCT: 24.2 % — ABNORMAL LOW (ref 36.0–46.0)
Hemoglobin: 7.5 g/dL — ABNORMAL LOW (ref 12.0–15.0)
MCH: 29.4 pg (ref 26.0–34.0)
MCHC: 31 g/dL (ref 30.0–36.0)
MCV: 94.9 fL (ref 80.0–100.0)
Platelets: 169 10*3/uL (ref 150–400)
RBC: 2.55 MIL/uL — ABNORMAL LOW (ref 3.87–5.11)
RDW: 19 % — ABNORMAL HIGH (ref 11.5–15.5)
WBC: 7.6 10*3/uL (ref 4.0–10.5)
nRBC: 0 % (ref 0.0–0.2)

## 2023-01-04 LAB — BASIC METABOLIC PANEL
Anion gap: 9 (ref 5–15)
BUN: 19 mg/dL (ref 8–23)
CO2: 27 mmol/L (ref 22–32)
Calcium: 8.9 mg/dL (ref 8.9–10.3)
Chloride: 101 mmol/L (ref 98–111)
Creatinine, Ser: 1.2 mg/dL — ABNORMAL HIGH (ref 0.44–1.00)
GFR, Estimated: 47 mL/min — ABNORMAL LOW (ref >60)
Glucose, Bld: 109 mg/dL — ABNORMAL HIGH (ref 70–99)
Potassium: 3.9 mmol/L (ref 3.5–5.1)
Sodium: 137 mmol/L (ref 135–145)

## 2023-01-04 LAB — PREPARE RBC (CROSSMATCH)

## 2023-01-04 MED ORDER — SODIUM CHLORIDE 0.9% IV SOLUTION: Freq: Once | INTRAVENOUS | Status: AC

## 2023-01-04 MED ORDER — AMIODARONE HCL 200 MG PO TABS
200.0000 mg | ORAL_TABLET | Freq: Every day | ORAL | Status: DC
  Administered 2023-01-05 – 2023-01-06 (×2): 200 mg via ORAL
  Filled 2023-01-04 (×2): qty 1

## 2023-01-04 MED ORDER — POTASSIUM CHLORIDE CRYS ER 20 MEQ PO TBCR
40.0000 meq | EXTENDED_RELEASE_TABLET | Freq: Once | ORAL | Status: AC
  Administered 2023-01-04: 40 meq via ORAL
  Filled 2023-01-04: qty 2

## 2023-01-04 MED ORDER — FUROSEMIDE 10 MG/ML IJ SOLN
40.0000 mg | Freq: Two times a day (BID) | INTRAMUSCULAR | Status: DC
  Administered 2023-01-04 – 2023-01-05 (×3): 40 mg via INTRAVENOUS
  Filled 2023-01-04 (×3): qty 4

## 2023-01-04 MED ORDER — POLYETHYLENE GLYCOL 3350 17 G PO PACK
17.0000 g | PACK | Freq: Every day | ORAL | Status: DC
  Administered 2023-01-04 – 2023-01-06 (×3): 17 g via ORAL
  Filled 2023-01-04 (×3): qty 1

## 2023-01-04 NOTE — Plan of Care (Signed)

## 2023-01-04 NOTE — Plan of Care (Signed)
  Problem: Education: Goal: Ability to verbalize understanding of medication therapies will improve Outcome: Progressing   Problem: Activity: Goal: Capacity to carry out activities will improve Outcome: Progressing   Problem: Education: Goal: Knowledge of General Education information will improve Description: Including pain rating scale, medication(s)/side effects and non-pharmacologic comfort measures Outcome: Progressing

## 2023-01-04 NOTE — Progress Notes (Addendum)
PROGRESS NOTE    Dana Horton  ZOX:[id number] DOB: [birth year] DOA: 12/24/2022 PCP: Crist Fat, MD  76/F w atrial fibrillation, CKD, COPD, GERD, hypertension, and OSA who presented with palpitations and generalized weakness. Apparently patient had not been taking metoprolol or apixaban. Because low blood pressure she was brought to the hospital. In the ED her blood pressure was 110/60, 96/63,  in Afib w/ bilateral rales and increased work of breathing,  CXR w/cardiomegaly, bilateral hilar vascular congestion, with small right pleural effusion.  -Patient was placed on IV furosemide, IV amiodarone and  Bipap.   -weaned off non invasive mechanical ventilation.  10/8 TEE with no atrial thrombus, severe aortic regurgitation, severe mitral regurgitation, moderate TR, LV systolic function preserved.  10/09 transfer from [facility] to [facility] for cardiac catheterization and cardiothoracic consultation.  RHC/LHC with normal cors, elevated wedge, hemodynamics of severe MR 10/10, CT chest with pulmonary edema, bilateral groundglass opacities and pleural effusions, reactive adenopathy and nodule and Left Kidney lesion   Subjective: Feels okay overall, still with some cough, sleeping reclined   Assessment and Plan:  Acute on chronic diastolic CHF  Severe mitral regurgitation Severe aortic insufficiency -Echo- EF 65 to 70%, moderate LVH, RV preserved, Severe MR, mod TR, mod AI -10/08 TEE with severe aortic regurgitation, severe mitral regurgitation, moderate TR -10/9 : RHC/LHC with normal coronaries, hemodynamics suggestive of severe mitral regurgitation -TCTS eval completed, not candidate for surgery, follow-up with structural heart team -Volume status improving, 7 LB, was changed to oral Lasix yesterday, follow-up CT chest with persistent pulmonary edema and effusions, restart IV Lasix today, continue metoprolol  -SGLT2i and MRA held for hypotension -Wean O2  Paroxysmal atrial fibrillation  with RVR (HCC) Continue amiodarone, metoprolol , apixaban on hold ? resume  Hypertensive urgency - weaned off nitroglycerin gtt -Continue with metoprolol.   Stage 3a chronic kidney disease (HCC) Hypokalemia,  -continue furosemide,   -Avoid hypotension  Chronic obstructive pulmonary disease (HCC) No signs of acute exacerbation.   Left Kidney lesion -noted on imaging, FU with Urology -Per Pt, she is well aware of this, the renal protocol CT from 5/24, suggests its a proteinaceous cyst, she's followed by Urology Dr.Manny who is aware abt this too  Pulm nodule -need FU  OSA on CPAP Home Cpap.   Acute on chronic anemia -Hemoglobin now down to 7.6, anemia panel this admission suggestive of chronic disease however previously throughout this year was indicative of iron deficiency  -Likely a combination of both, given IV iron yesterday, will transfuse 1 unit of PRBC given severity of valvular heart disease  Peripheral neuropathy Continue pregabalin.   GERD without esophagitis Continue pantoprazole.   DVT prophylaxis:SCDs Code Status: DNR Family Communication: No family at bedside, called and updated Sister Vernona Rieger Disposition Plan: Home in 1 to 2 days if stable  Consultants:    Procedures:   Antimicrobials:    Objective: Vitals:   01/04/23 0017 01/04/23 0429 01/04/23 0459 01/04/23 0819  BP: (!) 101/57 103/83    Pulse: 71 (!) 107    Resp: 16 18    Temp: 98.9 F (37.2 C) (!) 97 F (36.1 C) 97.6 F (36.4 C)   TempSrc: Axillary Axillary Axillary   SpO2: 97% 97%  97%  Weight:  56.1 kg    Height:        Intake/Output Summary (Last 24 hours) at 01/04/2023 1118 Last data filed at 01/04/2023 0637 Gross per 24 hour  Intake 100 ml  Output 1450 ml  Net -  1350 ml   Filed Weights   01/02/23 0441 01/03/23 0520 01/04/23 0429  Weight: 56.6 kg 56.2 kg 56.1 kg    Examination:  Gen: Awake, Alert, Oriented X 3,  HEENT: Positive JVD CVS: S1-S2, regular rhythm Lungs: Few  basilar Rales Abdomen: Soft, nontender, bowel sounds present Extremities: No edema Skin: no new rashes on exposed skin    Data Reviewed:   CBC: Recent Labs  Lab 12/30/22 0521 01/01/23 0502 01/02/23 0739 01/02/23 1546 01/02/23 1548 01/03/23 0314 01/04/23 0621  WBC 7.8 7.1 7.6  --   --  7.8 7.6  HGB 8.2* 7.8* 8.7* 8.2* 8.5* 7.6* 7.5*  HCT 26.5* 25.7* 28.2* 24.0* 25.0* 24.5* 24.2*  MCV 96.0 98.5 94.6  --   --  96.5 94.9  PLT 151 142* 172  --   --  158 169   Basic Metabolic Panel: Recent Labs  Lab 12/30/22 0521 01/01/23 0502 01/02/23 0739 01/02/23 1546 01/02/23 1548 01/03/23 0314 01/04/23 0949  NA 137 139 140 143 141 139 137  K 3.8 3.7 3.3* 4.3 4.3 4.1 3.9  CL 103 104 101  --   --  103 101  CO2 25 23 26   --   --  27 27  GLUCOSE 100* 97 119*  --   --  120* 109*  BUN 23 22 24*  --   --  21 19  CREATININE 0.73 0.71 0.93  --   --  1.18* 1.20*  CALCIUM 8.9 8.9 8.8*  --   --  8.8* 8.9   GFR: Estimated Creatinine Clearance: 33 mL/min (A) (by C-G formula based on SCr of 1.2 mg/dL (H)). Liver Function Tests: No results for input(s): "AST", "ALT", "ALKPHOS", "BILITOT", "PROT", "ALBUMIN" in the last 168 hours. No results for input(s): "LIPASE", "AMYLASE" in the last 168 hours. No results for input(s): "AMMONIA" in the last 168 hours. Coagulation Profile: No results for input(s): "INR", "PROTIME" in the last 168 hours. Cardiac Enzymes: No results for input(s): "CKTOTAL", "CKMB", "CKMBINDEX", "TROPONINI" in the last 168 hours. BNP (last 3 results) No results for input(s): "PROBNP" in the last 8760 hours. HbA1C: No results for input(s): "HGBA1C" in the last 72 hours. CBG: No results for input(s): "GLUCAP" in the last 168 hours. Lipid Profile: No results for input(s): "CHOL", "HDL", "LDLCALC", "TRIG", "CHOLHDL", "LDLDIRECT" in the last 72 hours. Thyroid Function Tests: No results for input(s): "TSH", "T4TOTAL", "FREET4", "T3FREE", "THYROIDAB" in the last 72 hours. Anemia  Panel: No results for input(s): "VITAMINB12", "FOLATE", "FERRITIN", "TIBC", "IRON", "RETICCTPCT" in the last 72 hours. Urine analysis:    Component Value Date/Time   COLORURINE YELLOW 12/26/2022 0328   APPEARANCEUR CLEAR 12/26/2022 0328   LABSPEC 1.017 12/26/2022 0328   PHURINE 5.0 12/26/2022 0328   GLUCOSEU NEGATIVE 12/26/2022 0328   HGBUR SMALL (A) 12/26/2022 0328   BILIRUBINUR NEGATIVE 12/26/2022 0328   KETONESUR NEGATIVE 12/26/2022 0328   PROTEINUR NEGATIVE 12/26/2022 0328   NITRITE NEGATIVE 12/26/2022 0328   LEUKOCYTESUR NEGATIVE 12/26/2022 0328   Sepsis Labs: @LABRCNTIP (procalcitonin:4,lacticidven:4)  ) No results found for this or any previous visit (from the past 240 hour(s)).    Radiology Studies: CT CHEST W CONTRAST  Result Date: 01/03/2023 CLINICAL DATA:  Shortness of breath EXAM: CT CHEST WITH CONTRAST TECHNIQUE: Multidetector CT imaging of the chest was performed during intravenous contrast administration. RADIATION DOSE REDUCTION: This exam was performed according to the departmental dose-optimization program which includes automated exposure control, adjustment of the mA and/or kV according to patient size and/or  use of iterative reconstruction technique. CONTRAST:  50mL OMNIPAQUE IOHEXOL 350 MG/ML SOLN COMPARISON:  Chest CTA dated October 15, 2015 FINDINGS: Cardiovascular: Normal heart size. Trace No pericardial effusion. Normal caliber thoracic aorta with severe atherosclerotic disease. Mild coronary artery calcifications. Mediastinum/Nodes: Esophagus and thyroid are unremarkable. New enlarged mediastinal and bilateral hilar lymph nodes. Reference right lower paratracheal lymph node measuring 12 mm in short axis on series 3, image 62 reference right hilar lymph node measuring 11 mm in short axis on image 70. Lungs/Pleura: Central airways are patent. Severe centrilobular emphysema. Diffuse ground-glass opacities and septal thickening. Moderate bilateral pleural effusions,  loculated appearance on the right. Solid pulmonary nodule of the right upper lobe measuring 4 mm on series 4, image 80. Upper Abdomen: Unchanged low-attenuation lesion of the left lobe of the liver, likely simple cysts. Layering high attenuation material in the gallbladder, likely due to sludge. Left adrenal gland mass measuring 3.0 cm, unchanged when compared with 2017 prior, consistent with benign adenoma given multi year stability. Possibly enhancing of the posterior right kidney measuring up to 2.0 cm, previously measured 0.6 cm. Musculoskeletal: No chest wall abnormality. No acute or significant osseous findings. IMPRESSION: 1. Diffuse ground-glass opacities and septal thickening, consistent with pulmonary edema. 2. Moderate bilateral pleural effusions, loculated on the right. 3. Enlarged mediastinal and bilateral hilar lymph nodes, likely reactive. 4. New solid pulmonary nodule of the right upper lobe measuring 4 mm. Consider Non-contrast chest CT can be considered in 12 months. This recommendation follows the consensus statement: Guidelines for Management of Incidental Pulmonary Nodules Detected on CT Images: From the Fleischner Society 2017; Radiology 2017; 284:228-243. 5. Lesion of the posterior left kidney measuring 2.0 cm, concerning for renal neoplasm. Recommend nonemergent renal protocol MRI or CT for further evaluation. 6. Aortic Atherosclerosis (ICD10-I70.0) and Emphysema (ICD10-J43.9). Electronically Signed   By: Allegra Lai M.D.   On: 01/03/2023 15:07   CARDIAC CATHETERIZATION  Addendum Date: 01/02/2023   1.  Normal coronary arteries. 2.  Normal LV systolic function. 3.  Right heart catheterization showed normal RA pressure, moderate pulmonary hypertension, moderately elevated wedge pressure with giant V waves and normal cardiac output. Recommendations: There is hemodynamic evidence of severe mitral regurgitation. Continue oral diuresis and evaluation for mitral valve repair. Avoid  catheterization via the right radial artery in the future due to right radial loop that did not allow advancement of a catheter  in spite of being able to navigate this with an 014 wire and use balloon assisted tracking.  The procedure was finished via the femoral approach.  Result Date: 01/02/2023 1.  Normal coronary arteries. 2.  Normal LV systolic function. 3.  Right heart catheterization showed normal RA pressure, moderate pulmonary hypertension, moderately elevated wedge pressure with giant V waves and normal cardiac output. Recommendations: There is hemodynamic evidence of severe mitral regurgitation. Continue oral diuresis and evaluation for mitral valve repair.     Scheduled Meds:  sodium chloride   Intravenous Once   [START ON 01/06/2023] amiodarone  200 mg Oral Daily   amiodarone  400 mg Oral BID   budesonide (PULMICORT) nebulizer solution  0.5 mg Nebulization BID   docusate sodium  100 mg Oral Daily   ferrous sulfate  325 mg Oral Q breakfast   fluticasone  2 spray Each Nare Daily   furosemide  40 mg Intravenous BID   levalbuterol  0.63 mg Nebulization BID   metoprolol tartrate  12.5 mg Oral BID   montelukast  10 mg Oral QPM  pantoprazole  40 mg Oral Daily   polyethylene glycol  17 g Oral Daily   pregabalin  50 mg Oral BID   rosuvastatin  5 mg Oral QODAY   Continuous Infusions:     LOS: 11 days    Time spent:    Zannie Cove, MD [facility]   01/04/2023, 11:18 AM

## 2023-01-04 NOTE — Progress Notes (Addendum)
Progress Note  Patient Name: Dana Horton Date of Encounter: 01/04/2023  Primary Cardiologist: Norman Herrlich, MD  Subjective   Reports she overall feels better. No CP. Breathing stable.  Inpatient Medications    Scheduled Meds:  [START ON 01/06/2023] amiodarone  200 mg Oral Daily   amiodarone  400 mg Oral BID   budesonide (PULMICORT) nebulizer solution  0.5 mg Nebulization BID   docusate sodium  100 mg Oral Daily   ferrous sulfate  325 mg Oral Q breakfast   fluticasone  2 spray Each Nare Daily   furosemide  40 mg Intravenous BID   levalbuterol  0.63 mg Nebulization BID   metoprolol tartrate  12.5 mg Oral BID   montelukast  10 mg Oral QPM   pantoprazole  40 mg Oral Daily   polyethylene glycol  17 g Oral Daily   potassium chloride  40 mEq Oral Once   pregabalin  50 mg Oral BID   rosuvastatin  5 mg Oral QODAY   Continuous Infusions:  PRN Meds: acetaminophen **OR** [DISCONTINUED] acetaminophen, ipratropium-albuterol, magnesium hydroxide, ondansetron **OR** ondansetron (ZOFRAN) IV, mouth rinse, phenol, traZODone   Vital Signs    Vitals:   01/04/23 0459 01/04/23 0819 01/04/23 0939 01/04/23 1110  BP:   (!) 101/42 (!) 98/42  Pulse:   68   Resp:      Temp: 97.6 F (36.4 C)     TempSrc: Axillary     SpO2:  97% 94%   Weight:      Height:        Intake/Output Summary (Last 24 hours) at 01/04/2023 1128 Last data filed at 01/04/2023 4098 Gross per 24 hour  Intake 100 ml  Output 1450 ml  Net -1350 ml      01/04/2023    4:29 AM 01/03/2023    5:20 AM 01/02/2023    4:41 AM  Last 3 Weights  Weight (lbs) 123 lb 9.6 oz 123 lb 14.4 oz 124 lb 12.8 oz  Weight (kg) 56.065 kg 56.2 kg 56.609 kg     Telemetry    NSR 60s presently - QTC - Personally Reviewed  Physical Exam   GEN: No acute distress.  HEENT: Normocephalic, atraumatic, sclera non-icteric. Neck: No JVD or bruits. Cardiac: RRR +apical SEM, no rubs or gallops.  Respiratory:Diffusely  diminished bilaterally without wheezing, rales, rhonchi. Breathing is unlabored. GI: Soft, nontender, non-distended, BS +x 4. MS: no deformity. Extremities: No clubbing or cyanosis. No edema. Distal pedal pulses are 2+ and equal bilaterally. Right groin cath site without hematoma, ecchymosis, or bruit. Pt prefers to keep tegaderm/gauze combo on for now Neuro:  AAOx3. Follows commands. Psych:  Responds to questions appropriately with a normal affect.  Labs    High Sensitivity Troponin:   Recent Labs  Lab 12/24/22 1701 12/24/22 2015  TROPONINIHS 11 13      Cardiac EnzymesNo results for input(s): "TROPONINI" in the last 168 hours. No results for input(s): "TROPIPOC" in the last 168 hours.   Chemistry Recent Labs  Lab 01/02/23 0739 01/02/23 1546 01/02/23 1548 01/03/23 0314 01/04/23 0949  NA 140   < > 141 139 137  K 3.3*   < > 4.3 4.1 3.9  CL 101  --   --  103 101  CO2 26  --   --  27 27  GLUCOSE 119*  --   --  120* 109*  BUN 24*  --   --  21 19  CREATININE 0.93  --   --  1.18* 1.20*  CALCIUM 8.8*  --   --  8.8* 8.9  GFRNONAA >60  --   --  48* 47*  ANIONGAP 13  --   --  9 9   < > = values in this interval not displayed.     Hematology Recent Labs  Lab 01/02/23 0739 01/02/23 1546 01/02/23 1548 01/03/23 0314 01/04/23 0621  WBC 7.6  --   --  7.8 7.6  RBC 2.98*  --   --  2.54* 2.55*  HGB 8.7*   < > 8.5* 7.6* 7.5*  HCT 28.2*   < > 25.0* 24.5* 24.2*  MCV 94.6  --   --  96.5 94.9  MCH 29.2  --   --  29.9 29.4  MCHC 30.9  --   --  31.0 31.0  RDW 19.0*  --   --  19.5* 19.0*  PLT 172  --   --  158 169   < > = values in this interval not displayed.    BNPNo results for input(s): "BNP", "PROBNP" in the last 168 hours.   DDimer No results for input(s): "DDIMER" in the last 168 hours.   Radiology    CT CHEST W CONTRAST  Result Date: 01/03/2023 CLINICAL DATA:  Shortness of breath EXAM: CT CHEST WITH CONTRAST TECHNIQUE: Multidetector CT imaging of the chest was  performed during intravenous contrast administration. RADIATION DOSE REDUCTION: This exam was performed according to the departmental dose-optimization program which includes automated exposure control, adjustment of the mA and/or kV according to patient size and/or use of iterative reconstruction technique. CONTRAST:  50mL OMNIPAQUE IOHEXOL 350 MG/ML SOLN COMPARISON:  Chest CTA dated October 15, 2015 FINDINGS: Cardiovascular: Normal heart size. Trace No pericardial effusion. Normal caliber thoracic aorta with severe atherosclerotic disease. Mild coronary artery calcifications. Mediastinum/Nodes: Esophagus and thyroid are unremarkable. New enlarged mediastinal and bilateral hilar lymph nodes. Reference right lower paratracheal lymph node measuring 12 mm in short axis on series 3, image 62 reference right hilar lymph node measuring 11 mm in short axis on image 70. Lungs/Pleura: Central airways are patent. Severe centrilobular emphysema. Diffuse ground-glass opacities and septal thickening. Moderate bilateral pleural effusions, loculated appearance on the right. Solid pulmonary nodule of the right upper lobe measuring 4 mm on series 4, image 80. Upper Abdomen: Unchanged low-attenuation lesion of the left lobe of the liver, likely simple cysts. Layering high attenuation material in the gallbladder, likely due to sludge. Left adrenal gland mass measuring 3.0 cm, unchanged when compared with 2017 prior, consistent with benign adenoma given multi year stability. Possibly enhancing of the posterior right kidney measuring up to 2.0 cm, previously measured 0.6 cm. Musculoskeletal: No chest wall abnormality. No acute or significant osseous findings. IMPRESSION: 1. Diffuse ground-glass opacities and septal thickening, consistent with pulmonary edema. 2. Moderate bilateral pleural effusions, loculated on the right. 3. Enlarged mediastinal and bilateral hilar lymph nodes, likely reactive. 4. New solid pulmonary nodule of the right  upper lobe measuring 4 mm. Consider Non-contrast chest CT can be considered in 12 months. This recommendation follows the consensus statement: Guidelines for Management of Incidental Pulmonary Nodules Detected on CT Images: From the Fleischner Society 2017; Radiology 2017; 284:228-243. 5. Lesion of the posterior left kidney measuring 2.0 cm, concerning for renal neoplasm. Recommend nonemergent renal protocol MRI or CT for further evaluation. 6. Aortic Atherosclerosis (ICD10-I70.0) and Emphysema (ICD10-J43.9). Electronically Signed   By: Allegra Lai M.D.   On: 01/03/2023 15:07   CARDIAC CATHETERIZATION  Addendum Date:  01/02/2023   1.  Normal coronary arteries. 2.  Normal LV systolic function. 3.  Right heart catheterization showed normal RA pressure, moderate pulmonary hypertension, moderately elevated wedge pressure with giant V waves and normal cardiac output. Recommendations: There is hemodynamic evidence of severe mitral regurgitation. Continue oral diuresis and evaluation for mitral valve repair. Avoid catheterization via the right radial artery in the future due to right radial loop that did not allow advancement of a catheter  in spite of being able to navigate this with an 014 wire and use balloon assisted tracking.  The procedure was finished via the femoral approach.  Result Date: 01/02/2023 1.  Normal coronary arteries. 2.  Normal LV systolic function. 3.  Right heart catheterization showed normal RA pressure, moderate pulmonary hypertension, moderately elevated wedge pressure with giant V waves and normal cardiac output. Recommendations: There is hemodynamic evidence of severe mitral regurgitation. Continue oral diuresis and evaluation for mitral valve repair.    Cardiac Studies   [facility] 01/02/23  1.  Normal coronary arteries. 2.  Normal LV systolic function. 3.  Right heart catheterization showed normal RA pressure, moderate pulmonary hypertension, moderately elevated wedge pressure with  giant V waves and normal cardiac output.   Recommendations: There is hemodynamic evidence of severe mitral regurgitation. Continue oral diuresis and evaluation for mitral valve repair. Avoid catheterization via the right radial artery in the future due to right radial loop that did not allow advancement of a catheter  in spite of being able to navigate this with an 014 wire and use balloon assisted tracking.  The procedure was finished via the femoral approach.   TEE 01/01/23   1. Left ventricular ejection fraction, by estimation, is 65 to 70%. The  left ventricle has normal function.   2. Right ventricular systolic function is normal. The right ventricular  size is normal. There is severely elevated pulmonary artery systolic  pressure. The estimated right ventricular systolic pressure is 60.7 mmHg.   3. Left atrial size was severely dilated. No left atrial/left atrial  appendage thrombus was detected.   4. Right atrial size was moderately dilated.   5. A small pericardial effusion is present. The pericardial effusion is  circumferential.   6. Flail P3 leaflet with possible cleft between P2-P3. The mitral valve  is abnormal. Severe mitral valve regurgitation.   7. Tricuspid valve regurgitation is moderate.   8. The aortic valve is tricuspid. Aortic valve regurgitation is severe.  No aortic stenosis is present.   9. There is Moderate (Grade III) plaque involving the descending aorta.  10. Severely dilated pulmonary artery.   Conclusion(s)/Recommendation(s): Severe MR with flail P3 leaflet and  possible cleft between P2-P3. Severe aortic regurgitation. Severely  dilated PA with severely elevated PA pressure.    Patient Profile     77 y.o. female with paroxysmal atrial fibrillation on Eliquis (diagnosed 2018 with subsequent recurrent episodes managed with flecainide), chronic HFpEF, HTN, tobacco use with COPD, OSA on CPAP, CKD stage IIIa, bladder cancer, HLD, GERD, iron deficiency  anemia admitted 12/24/2022 from PCP with hypotension and tachycardia with AF RVR. Found to have newly dx severe mitral regurgitation/tricuspid regurgitation/aortic regurgitation, seen by structural team with consideration of mTEER.  Assessment & Plan    1. Acute HFpEF in setting of mixed/multiple valve disease, mild AKI on labs which might reflect new baseline - torrential mitral regurgitation primarily mediated with the P2-P3 flail, moderate tricuspid regurgitation, and severe aortic regurgitation. Noted basal septal hypertrophy and severe left atrial  enlargement. Dr. Izora Ribas did not think the P2/P3 cleft is prohibitive for intervention, See phone note from structural team per [facility] - RA pressure was 4 mm Hg by RHC 10/9 with aggressive medical therapy - notes indicate plan was to transition to oral Lasix but put back on IV Lasix by IM today, will review with MD - final structural plans pending - CT chest done yesterday with numerous pertinent findings - diffuse GGO/septal thickening concerning for continued pulmonary edema, moderate bilateral effusions including loculated on the right, reactive LN, new 4mm pulm nodule (F/u recommended 12 months) and left kidney lesion concerning for renal neoplasm - will need further workup of this, timing TBD, did not discuss yet with patient pending discussion with team - hypotension otherwise prohibits aggressive GDMT  2. Paroxysmal atrial fibrillation - on Eliquis PTA, this  was held starting 10/8, will discuss resumption of anticoagulation with MD - remains anemic - continue amiodarone 400mg  BID then reduce to 200mg  daily on 10/12 - adjusted MAR to reflect this - continue metoprolol 12.5mg  BID  3. H/o PAD, aortic atherosclerosis, HLD - continue statin therapy  4. H/o iron deficiency anemia - Hgb in the 7's remains ongoing issue, down from 9's in September 2024 - appreciate IM assistance  For questions or updates, please contact [facility] Please consult [email or web address] for contact info under Cardiology/STEMI.  Signed, Laurann Montana, PA-C 01/04/2023, 11:28 AM    Personally seen and examined. Agree with APP above with the following comments: CT findings suggestive of volume; discussed with structural heart team. Renal and some of her other findings are chronic in nature. Patient notes that she feels even better than when she was walking around [facility]  Exam notable for diffuse rhonchi.  She has IRIR rhythm and tachycardia  Tele: back in atrial fibrillation, rates ~ 110 Would recommend  - aggress with transfusion, no clear bleeding diathesis, restart AC when able - continue amiodarone, and will not increase metoprolol due to blood pressure - agree with 40 IV lasix BID, if tolerates this consider SGLT2i tomorrow - we are coordinating a potential mTEER as a salvage intervention next week; presently timing is in the works  Riley Lam, MD FASE [facility] Cardiologist [facility]  [address] [place], [address] [place], [place] [address] [phone]  2:06 PM

## 2023-01-05 DIAGNOSIS — I5033 Acute on chronic diastolic (congestive) heart failure: Secondary | ICD-10-CM | POA: Diagnosis not present

## 2023-01-05 DIAGNOSIS — I340 Nonrheumatic mitral (valve) insufficiency: Secondary | ICD-10-CM | POA: Diagnosis not present

## 2023-01-05 LAB — TYPE AND SCREEN
ABO/RH(D): O POS
Antibody Screen: NEGATIVE
Unit division: 0

## 2023-01-05 LAB — CBC
HCT: 29.6 % — ABNORMAL LOW (ref 36.0–46.0)
Hemoglobin: 9.3 g/dL — ABNORMAL LOW (ref 12.0–15.0)
MCH: 28.3 pg (ref 26.0–34.0)
MCHC: 31.4 g/dL (ref 30.0–36.0)
MCV: 90 fL (ref 80.0–100.0)
Platelets: 186 10*3/uL (ref 150–400)
RBC: 3.29 MIL/uL — ABNORMAL LOW (ref 3.87–5.11)
RDW: 21.1 % — ABNORMAL HIGH (ref 11.5–15.5)
WBC: 8.5 10*3/uL (ref 4.0–10.5)
nRBC: 0 % (ref 0.0–0.2)

## 2023-01-05 LAB — URINALYSIS, ROUTINE W REFLEX MICROSCOPIC
Bacteria, UA: NONE SEEN
Bilirubin Urine: NEGATIVE
Glucose, UA: >=500 mg/dL — AB
Ketones, ur: NEGATIVE mg/dL
Leukocytes,Ua: NEGATIVE
Nitrite: NEGATIVE
Protein, ur: NEGATIVE mg/dL
Specific Gravity, Urine: 1.008 (ref 1.005–1.030)
pH: 5 (ref 5.0–8.0)

## 2023-01-05 LAB — BASIC METABOLIC PANEL
Anion gap: 11 (ref 5–15)
BUN: 21 mg/dL (ref 8–23)
CO2: 28 mmol/L (ref 22–32)
Calcium: 9 mg/dL (ref 8.9–10.3)
Chloride: 98 mmol/L (ref 98–111)
Creatinine, Ser: 1.28 mg/dL — ABNORMAL HIGH (ref 0.44–1.00)
GFR, Estimated: 43 mL/min — ABNORMAL LOW (ref >60)
Glucose, Bld: 99 mg/dL (ref 70–99)
Potassium: 4.2 mmol/L (ref 3.5–5.1)
Sodium: 137 mmol/L (ref 135–145)

## 2023-01-05 LAB — BPAM RBC
Blood Product Expiration Date: 202411072359
ISSUE DATE / TIME: 202410111359
Unit Type and Rh: 5100

## 2023-01-05 MED ORDER — EMPAGLIFLOZIN 10 MG PO TABS
10.0000 mg | ORAL_TABLET | Freq: Every day | ORAL | Status: DC
  Administered 2023-01-05 – 2023-01-06 (×2): 10 mg via ORAL
  Filled 2023-01-05 (×2): qty 1

## 2023-01-05 MED ORDER — FUROSEMIDE 40 MG PO TABS
40.0000 mg | ORAL_TABLET | Freq: Every day | ORAL | Status: DC
Start: 2023-01-05 — End: 2023-01-05

## 2023-01-05 MED ORDER — SODIUM CHLORIDE 0.9 % IV BOLUS
500.0000 mL | Freq: Once | INTRAVENOUS | Status: AC
  Administered 2023-01-05: 500 mL via INTRAVENOUS

## 2023-01-05 MED ORDER — FUROSEMIDE 10 MG/ML IJ SOLN
40.0000 mg | Freq: Two times a day (BID) | INTRAMUSCULAR | Status: DC
  Administered 2023-01-05 – 2023-01-06 (×2): 40 mg via INTRAVENOUS
  Filled 2023-01-05 (×2): qty 4

## 2023-01-05 MED ORDER — APIXABAN 5 MG PO TABS
5.0000 mg | ORAL_TABLET | Freq: Two times a day (BID) | ORAL | Status: DC
  Administered 2023-01-05 – 2023-01-06 (×3): 5 mg via ORAL
  Filled 2023-01-05 (×3): qty 1

## 2023-01-05 MED ORDER — SODIUM CHLORIDE 0.9 % IV BOLUS: 1000.0000 mL | Freq: Once | INTRAVENOUS | Status: DC

## 2023-01-05 MED ORDER — LACTULOSE 10 GM/15ML PO SOLN
20.0000 g | Freq: Two times a day (BID) | ORAL | Status: AC
  Administered 2023-01-05: 20 g via ORAL
  Filled 2023-01-05 (×2): qty 30

## 2023-01-05 MED ORDER — MIDODRINE HCL 5 MG PO TABS
10.0000 mg | ORAL_TABLET | Freq: Two times a day (BID) | ORAL | Status: DC
  Administered 2023-01-05 – 2023-01-06 (×3): 10 mg via ORAL
  Filled 2023-01-05 (×3): qty 2

## 2023-01-05 NOTE — Plan of Care (Signed)
Problem: Education: Goal: Ability to demonstrate management of disease process will improve Outcome: Progressing Goal: Ability to verbalize understanding of medication therapies will improve Outcome: Progressing Goal: Individualized Educational Video(s) Outcome: Progressing   Problem: Activity: Goal: Capacity to carry out activities will improve Outcome: Progressing

## 2023-01-05 NOTE — Progress Notes (Addendum)
  Patient nurse reported that patient has blood pressure 90/40 and MAP 57.  Patient is hemodynamically stable. - Giving 500 bolus of NS. -Ordering midodrine 10 mg twice daily. - Recommending to hold morning Lasix 40 mg.  Addendum - After receiving 500 mL of NS bolus patient blood pressure still 105/43 and MAP is 64.  Giving patient another 500 mL of bolus of NS.  If patient maps continues to run slow less than 65 need to inform PCCM for requirement of pressor support.   Tereasa Coop, MD [facility] 01/05/2023, 5:15 AM

## 2023-01-05 NOTE — Progress Notes (Signed)
Mobility Specialist Progress Note:    01/05/23 1010  Mobility  Activity Ambulated with assistance in hallway  Level of Assistance Standby assist, set-up cues, supervision of patient - no hands on  Assistive Device None  Distance Ambulated (ft) 500 ft  Activity Response Tolerated well  Mobility Referral Yes  $Mobility charge 1 Mobility  Mobility Specialist Start Time (ACUTE ONLY) 0910  Mobility Specialist Stop Time (ACUTE ONLY) 0925  Mobility Specialist Time Calculation (min) (ACUTE ONLY) 15 min   Received pt at sink brushing teeth having no complaints and agreeable to mobility. Pt was asymptomatic throughout ambulation and returned to room w/o fault. Left seated EOB w/ call bell in reach and all needs met.   Thompson Grayer Mobility Specialist  Please contact vis Secure Chat or  Rehab Office [phone]

## 2023-01-05 NOTE — Progress Notes (Addendum)
Progress Note  Patient Name: Dana Horton Date of Encounter: 01/05/2023  Primary Cardiologist: Norman Herrlich, MD  Subjective  She feels well this AM. Not on O2. She is ready to go home  Inpatient Medications    Scheduled Meds:  amiodarone  200 mg Oral Daily   budesonide (PULMICORT) nebulizer solution  0.5 mg Nebulization BID   docusate sodium  100 mg Oral Daily   ferrous sulfate  325 mg Oral Q breakfast   fluticasone  2 spray Each Nare Daily   furosemide  40 mg Intravenous BID   lactulose  20 g Oral BID   levalbuterol  0.63 mg Nebulization BID   metoprolol tartrate  12.5 mg Oral BID   midodrine  10 mg Oral BID WC   montelukast  10 mg Oral QPM   pantoprazole  40 mg Oral Daily   polyethylene glycol  17 g Oral Daily   pregabalin  50 mg Oral BID   rosuvastatin  5 mg Oral QODAY   Continuous Infusions:  PRN Meds: acetaminophen **OR** [DISCONTINUED] acetaminophen, ipratropium-albuterol, magnesium hydroxide, ondansetron **OR** ondansetron (ZOFRAN) IV, mouth rinse, phenol, traZODone   Vital Signs    Vitals:   01/05/23 0626 01/05/23 0636 01/05/23 0757 01/05/23 0838  BP: (!) 93/45 (!) 105/43 (!) 104/42   Pulse:   60   Resp:   18   Temp:   (!) 96.6 F (35.9 C)   TempSrc:   Axillary   SpO2:   97% 100%  Weight:      Height:        Intake/Output Summary (Last 24 hours) at 01/05/2023 1104 Last data filed at 01/05/2023 0700 Gross per 24 hour  Intake 996 ml  Output 3100 ml  Net -2104 ml      01/05/2023    4:48 AM 01/04/2023    4:29 AM 01/03/2023    5:20 AM  Last 3 Weights  Weight (lbs) 121 lb 7.6 oz 123 lb 9.6 oz 123 lb 14.4 oz  Weight (kg) 55.1 kg 56.065 kg 56.2 kg     Telemetry    NSR - Personally Reviewed  Physical Exam   GEN: No acute distress.  HEENT: Normocephalic, atraumatic, sclera non-icteric. Neck: No JVD or bruits. Cardiac: RRR  Respiratory: nl wob, mild decreased BS BL GI: Soft, nontender, non-distended, BS +x 4. MS: no  deformity. Extremities: No clubbing or cyanosis. No edema. Distal pedal pulses are 2+ and equal bilaterally. Right groin cath site without hematoma, ecchymosis, or bruit. Pt prefers to keep tegaderm/gauze combo on for now Neuro:  AAOx3. Follows commands. Psych:  Responds to questions appropriately with a normal affect.  Labs    High Sensitivity Troponin:   Recent Labs  Lab 12/24/22 1701 12/24/22 2015  TROPONINIHS 11 13      Cardiac EnzymesNo results for input(s): "TROPONINI" in the last 168 hours. No results for input(s): "TROPIPOC" in the last 168 hours.   Chemistry Recent Labs  Lab 01/03/23 0314 01/04/23 0949 01/05/23 0321  NA 139 137 137  K 4.1 3.9 4.2  CL 103 101 98  CO2 27 27 28   GLUCOSE 120* 109* 99  BUN 21 19 21   CREATININE 1.18* 1.20* 1.28*  CALCIUM 8.8* 8.9 9.0  GFRNONAA 48* 47* 43*  ANIONGAP 9 9 11      Hematology Recent Labs  Lab 01/03/23 0314 01/04/23 0621 01/05/23 0321  WBC 7.8 7.6 8.5  RBC 2.54* 2.55* 3.29*  HGB 7.6* 7.5* 9.3*  HCT 24.5* 24.2* 29.6*  MCV 96.5 94.9 90.0  MCH 29.9 29.4 28.3  MCHC 31.0 31.0 31.4  RDW 19.5* 19.0* 21.1*  PLT 158 169 186    BNPNo results for input(s): "BNP", "PROBNP" in the last 168 hours.   DDimer No results for input(s): "DDIMER" in the last 168 hours.   Radiology    No results found.  Cardiac Studies   [facility] 01/02/23  1.  Normal coronary arteries. 2.  Normal LV systolic function. 3.  Right heart catheterization showed normal RA pressure, moderate pulmonary hypertension, moderately elevated wedge pressure with giant V waves and normal cardiac output.   Recommendations: There is hemodynamic evidence of severe mitral regurgitation. Continue oral diuresis and evaluation for mitral valve repair. Avoid catheterization via the right radial artery in the future due to right radial loop that did not allow advancement of a catheter  in spite of being able to navigate this with an 014 wire and use balloon assisted  tracking.  The procedure was finished via the femoral approach.   TEE 01/01/23   1. Left ventricular ejection fraction, by estimation, is 65 to 70%. The  left ventricle has normal function.   2. Right ventricular systolic function is normal. The right ventricular  size is normal. There is severely elevated pulmonary artery systolic  pressure. The estimated right ventricular systolic pressure is 60.7 mmHg.   3. Left atrial size was severely dilated. No left atrial/left atrial  appendage thrombus was detected.   4. Right atrial size was moderately dilated.   5. A small pericardial effusion is present. The pericardial effusion is  circumferential.   6. Flail P3 leaflet with possible cleft between P2-P3. The mitral valve  is abnormal. Severe mitral valve regurgitation.   7. Tricuspid valve regurgitation is moderate.   8. The aortic valve is tricuspid. Aortic valve regurgitation is severe.  No aortic stenosis is present.   9. There is Moderate (Grade III) plaque involving the descending aorta.  10. Severely dilated pulmonary artery.   Conclusion(s)/Recommendation(s): Severe MR with flail P3 leaflet and  possible cleft between P2-P3. Severe aortic regurgitation. Severely  dilated PA with severely elevated PA pressure.    Patient Profile     77 y.o. female with paroxysmal atrial fibrillation on Eliquis (diagnosed 2018 with subsequent recurrent episodes managed with flecainide), chronic HFpEF, HTN, tobacco use with COPD, OSA on CPAP, CKD stage IIIa, bladder cancer, HLD, GERD, iron deficiency anemia admitted 12/24/2022 from PCP with hypotension and tachycardia with AF RVR. Found to have newly dx severe mitral regurgitation/tricuspid regurgitation/aortic regurgitation, seen by structural team with consideration of mTEER.  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 Noted basal septal hypertrophy and severe left atrial enlargement. Dr. 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 I switched her to  lasix 40 mg daily oral. Starting SGLT2i  2. Paroxysmal atrial fibrillation - on Eliquis PTA, this  was held starting 10/8 2/2 anemia, unknown etiolgy, hgb improved and stable. Restart AC - continue amiodarone 400mg  BID then reduce to 200mg  daily on 10/12 - adjusted MAR to reflect this. Stop home flecainide - continue metoprolol 12.5mg  BID  HTN - continue home valsartan 160 mg daily  H/o PAD, aortic atherosclerosis, HLD - continue statin therapy  She notes feeling well today. Crt is stable. Switched to oral lasix, she is not on O2. She has scheduled plans for mitra clip eval on 10/16 with Dr. Excell Seltzer. Otherwise, she can go when felt to be euvolemic   Time Spent Directly with Patient:  I have spent a total of 35 minutes with the patient reviewing hospital notes, telemetry, EKGs, labs and examining the patient as well as establishing an assessment and plan that was discussed personally with the patient.  > 50% of time was spent in direct patient care.   For questions or updates, please contact [facility] Please consult [email or web address] for contact info under  Cardiology/STEMI.  Signed, Maisie Fus, MD 01/05/2023, 11:04 AM

## 2023-01-05 NOTE — Plan of Care (Signed)

## 2023-01-05 NOTE — Progress Notes (Signed)
PROGRESS NOTE    Dana Horton  RUE:[id number] DOB: 10-05-1945 DOA: 12/24/2022 PCP: Crist Fat, MD  76/F w atrial fibrillation, CKD, COPD, GERD, hypertension, and OSA who presented with palpitations and generalized weakness. Apparently patient had not been taking metoprolol or apixaban. Because low blood pressure she was brought to the hospital. In the ED her blood pressure was 110/60, 96/63,  in Afib w/ bilateral rales and increased work of breathing,  CXR w/cardiomegaly, bilateral hilar vascular congestion, with small right pleural effusion.  -Patient was placed on IV furosemide, IV amiodarone and  Bipap.   -weaned off non invasive mechanical ventilation.  10/8 TEE with no atrial thrombus, severe aortic regurgitation, severe mitral regurgitation, moderate TR, LV systolic function preserved.  10/09 transfer from [facility] to [facility] for cardiac catheterization and cardiothoracic consultation.  RHC/LHC with normal cors, elevated wedge, hemodynamics of severe MR 10/10, CT chest with pulmonary edema, bilateral groundglass opacities and pleural effusions, reactive adenopathy and nodule and Left Kidney lesion   Subjective: Blood pressure in the 90s overnight, asymptomatic but was given a liter fluid bolus   Assessment and Plan:  Acute on chronic diastolic CHF  Severe mitral regurgitation Severe aortic insufficiency -Echo- EF 65 to 70%, moderate LVH, RV preserved, Severe MR, mod TR, mod AI -10/08 TEE with severe aortic regurgitation, severe mitral regurgitation, moderate TR -10/9 : RHC/LHC with normal coronaries, hemodynamics suggestive of severe mitral regurgitation -TCTS eval completed, not candidate for surgery, follow-up with structural heart team -Volume status improving, down 9 LB,  follow-up CT chest with persistent pulmonary edema and effusions, restart IV Lasix yesterday, continue IV diuretics 1 more day, continue metoprolol  -SGLT2i and MRA held for hypotension -Weaned off  O2, discharge planning, home tomorrow if stable -Plan for mitral clip on Wednesday 10/16, preop labs to be drawn tomorrow prior to discharge  Paroxysmal atrial fibrillation with RVR (HCC) Continue amiodarone, metoprolol , restarted apixaban  Hypertensive urgency - weaned off nitroglycerin gtt -Continue with metoprolol.   Stage 3a chronic kidney disease (HCC) Hypokalemia,  -continue furosemide,   -Avoid hypotension  Chronic obstructive pulmonary disease (HCC) No signs of acute exacerbation.   Left Kidney lesion -noted on imaging, FU with Urology -Per Pt, she is well aware of this, the renal protocol CT from 5/24, suggests its a proteinaceous cyst, she's followed by Urology Dr.Manny who is aware abt this too  Pulm nodule -need FU  OSA on CPAP Home Cpap.   Acute on chronic anemia -Hemoglobin now down to 7.6, anemia panel this admission suggestive of chronic disease however previously throughout this year was indicative of iron deficiency  -Likely a combination of both, given IV iron and transfused 1 unit PRBC 10/11  Peripheral neuropathy Continue pregabalin.   GERD without esophagitis Continue pantoprazole.   DVT prophylaxis:SCDs Code Status: DNR Family Communication: No family at bedside Disposition Plan: Home tomorrow  Consultants:    Procedures:   Antimicrobials:    Objective: Vitals:   01/05/23 0636 01/05/23 0757 01/05/23 0838 01/05/23 1218  BP: (!) 105/43 (!) 104/42    Pulse:  60    Resp:  18  15  Temp:  (!) 96.6 F (35.9 C)  97.8 F (36.6 C)  TempSrc:  Axillary  Oral  SpO2:  97% 100%   Weight:      Height:        Intake/Output Summary (Last 24 hours) at 01/05/2023 1222 Last data filed at 01/05/2023 1220 Gross per 24 hour  Intake 996 ml  Output 4100 ml  Net -3104 ml   Filed Weights   01/03/23 0520 01/04/23 0429 01/05/23 0448  Weight: 56.2 kg 56.1 kg 55.1 kg    Examination:  Gen: Pleasant thinly built female sitting up in bed,  AAOx3 HEENT: Positive JVD CVS: S1-S2, regular rhythm Lungs: Few basilar Rales Abdomen: Soft, nontender, bowel sounds present Extremities: No edema Skin: no new rashes on exposed skin    Data Reviewed:   CBC: Recent Labs  Lab 01/01/23 0502 01/02/23 0739 01/02/23 1546 01/02/23 1548 01/03/23 0314 01/04/23 0621 01/05/23 0321  WBC 7.1 7.6  --   --  7.8 7.6 8.5  HGB 7.8* 8.7* 8.2* 8.5* 7.6* 7.5* 9.3*  HCT 25.7* 28.2* 24.0* 25.0* 24.5* 24.2* 29.6*  MCV 98.5 94.6  --   --  96.5 94.9 90.0  PLT 142* 172  --   --  158 169 186   Basic Metabolic Panel: Recent Labs  Lab 01/01/23 0502 01/02/23 0739 01/02/23 1546 01/02/23 1548 01/03/23 0314 01/04/23 0949 01/05/23 0321  NA 139 140 143 141 139 137 137  K 3.7 3.3* 4.3 4.3 4.1 3.9 4.2  CL 104 101  --   --  103 101 98  CO2 23 26  --   --  27 27 28   GLUCOSE 97 119*  --   --  120* 109* 99  BUN 22 24*  --   --  21 19 21   CREATININE 0.71 0.93  --   --  1.18* 1.20* 1.28*  CALCIUM 8.9 8.8*  --   --  8.8* 8.9 9.0   GFR: Estimated Creatinine Clearance: 30.9 mL/min (A) (by C-G formula based on SCr of 1.28 mg/dL (H)). Liver Function Tests: No results for input(s): "AST", "ALT", "ALKPHOS", "BILITOT", "PROT", "ALBUMIN" in the last 168 hours. No results for input(s): "LIPASE", "AMYLASE" in the last 168 hours. No results for input(s): "AMMONIA" in the last 168 hours. Coagulation Profile: No results for input(s): "INR", "PROTIME" in the last 168 hours. Cardiac Enzymes: No results for input(s): "CKTOTAL", "CKMB", "CKMBINDEX", "TROPONINI" in the last 168 hours. BNP (last 3 results) No results for input(s): "PROBNP" in the last 8760 hours. HbA1C: No results for input(s): "HGBA1C" in the last 72 hours. CBG: No results for input(s): "GLUCAP" in the last 168 hours. Lipid Profile: No results for input(s): "CHOL", "HDL", "LDLCALC", "TRIG", "CHOLHDL", "LDLDIRECT" in the last 72 hours. Thyroid Function Tests: No results for input(s): "TSH",  "T4TOTAL", "FREET4", "T3FREE", "THYROIDAB" in the last 72 hours. Anemia Panel: No results for input(s): "VITAMINB12", "FOLATE", "FERRITIN", "TIBC", "IRON", "RETICCTPCT" in the last 72 hours. Urine analysis:    Component Value Date/Time   COLORURINE YELLOW 12/26/2022 0328   APPEARANCEUR CLEAR 12/26/2022 0328   LABSPEC 1.017 12/26/2022 0328   PHURINE 5.0 12/26/2022 0328   GLUCOSEU NEGATIVE 12/26/2022 0328   HGBUR SMALL (A) 12/26/2022 0328   BILIRUBINUR NEGATIVE 12/26/2022 0328   KETONESUR NEGATIVE 12/26/2022 0328   PROTEINUR NEGATIVE 12/26/2022 0328   NITRITE NEGATIVE 12/26/2022 0328   LEUKOCYTESUR NEGATIVE 12/26/2022 0328   Sepsis Labs: @LABRCNTIP (procalcitonin:4,lacticidven:4)  ) No results found for this or any previous visit (from the past 240 hour(s)).    Radiology Studies: No results found.   Scheduled Meds:  amiodarone  200 mg Oral Daily   apixaban  5 mg Oral BID   budesonide (PULMICORT) nebulizer solution  0.5 mg Nebulization BID   docusate sodium  100 mg Oral Daily   empagliflozin  10 mg Oral Daily   ferrous  sulfate  325 mg Oral Q breakfast   fluticasone  2 spray Each Nare Daily   furosemide  40 mg Intravenous BID   lactulose  20 g Oral BID   levalbuterol  0.63 mg Nebulization BID   metoprolol tartrate  12.5 mg Oral BID   midodrine  10 mg Oral BID WC   montelukast  10 mg Oral QPM   pantoprazole  40 mg Oral Daily   polyethylene glycol  17 g Oral Daily   pregabalin  50 mg Oral BID   rosuvastatin  5 mg Oral QODAY   Continuous Infusions:     LOS: 12 days    Time spent:    Zannie Cove, MD [facility]   01/05/2023, 12:22 PM

## 2023-01-06 DIAGNOSIS — I5033 Acute on chronic diastolic (congestive) heart failure: Secondary | ICD-10-CM | POA: Diagnosis not present

## 2023-01-06 LAB — COMPREHENSIVE METABOLIC PANEL
ALT: 10 U/L (ref 0–44)
AST: 12 U/L — ABNORMAL LOW (ref 15–41)
Albumin: 2.4 g/dL — ABNORMAL LOW (ref 3.5–5.0)
Alkaline Phosphatase: 72 U/L (ref 38–126)
Anion gap: 11 (ref 5–15)
BUN: 24 mg/dL — ABNORMAL HIGH (ref 8–23)
CO2: 29 mmol/L (ref 22–32)
Calcium: 8.9 mg/dL (ref 8.9–10.3)
Chloride: 98 mmol/L (ref 98–111)
Creatinine, Ser: 1.19 mg/dL — ABNORMAL HIGH (ref 0.44–1.00)
GFR, Estimated: 47 mL/min — ABNORMAL LOW (ref >60)
Glucose, Bld: 92 mg/dL (ref 70–99)
Potassium: 3.8 mmol/L (ref 3.5–5.1)
Sodium: 138 mmol/L (ref 135–145)
Total Bilirubin: 0.5 mg/dL (ref 0.3–1.2)
Total Protein: 6.5 g/dL (ref 6.5–8.1)

## 2023-01-06 LAB — APTT: aPTT: 43 s — ABNORMAL HIGH (ref 24–36)

## 2023-01-06 LAB — CBC
HCT: 28.6 % — ABNORMAL LOW (ref 36.0–46.0)
Hemoglobin: 9.1 g/dL — ABNORMAL LOW (ref 12.0–15.0)
MCH: 29 pg (ref 26.0–34.0)
MCHC: 31.8 g/dL (ref 30.0–36.0)
MCV: 91.1 fL (ref 80.0–100.0)
Platelets: 186 10*3/uL (ref 150–400)
RBC: 3.14 MIL/uL — ABNORMAL LOW (ref 3.87–5.11)
RDW: 19.9 % — ABNORMAL HIGH (ref 11.5–15.5)
WBC: 7.9 10*3/uL (ref 4.0–10.5)
nRBC: 0 % (ref 0.0–0.2)

## 2023-01-06 LAB — BRAIN NATRIURETIC PEPTIDE: B Natriuretic Peptide: 333.6 pg/mL — ABNORMAL HIGH (ref 0.0–100.0)

## 2023-01-06 LAB — PROTIME-INR
INR: 1.5 — ABNORMAL HIGH (ref 0.8–1.2)
Prothrombin Time: 18 s — ABNORMAL HIGH (ref 11.4–15.2)

## 2023-01-06 MED ORDER — POTASSIUM CHLORIDE CRYS ER 20 MEQ PO TBCR: 20.0000 meq | EXTENDED_RELEASE_TABLET | Freq: Every day | ORAL | 0 refills | Status: DC

## 2023-01-06 MED ORDER — POLYETHYLENE GLYCOL 3350 17 G PO PACK: 17.0000 g | PACK | Freq: Every day | ORAL | 0 refills | Status: DC | PRN

## 2023-01-06 MED ORDER — METOPROLOL TARTRATE 25 MG PO TABS: 12.5000 mg | ORAL_TABLET | Freq: Two times a day (BID) | ORAL | 0 refills | Status: DC

## 2023-01-06 MED ORDER — TRAZODONE HCL 50 MG PO TABS: 25.0000 mg | ORAL_TABLET | Freq: Every evening | ORAL | 0 refills | Status: DC | PRN

## 2023-01-06 MED ORDER — AMIODARONE HCL 200 MG PO TABS: 200.0000 mg | ORAL_TABLET | Freq: Every day | ORAL | 0 refills | Status: DC

## 2023-01-06 MED ORDER — FUROSEMIDE 40 MG PO TABS: 40.0000 mg | ORAL_TABLET | Freq: Every day | ORAL | 0 refills | Status: DC

## 2023-01-06 MED ORDER — MIDODRINE HCL 10 MG PO TABS: 10.0000 mg | ORAL_TABLET | Freq: Two times a day (BID) | ORAL | 0 refills | Status: DC

## 2023-01-06 MED ORDER — EMPAGLIFLOZIN 10 MG PO TABS: 10.0000 mg | ORAL_TABLET | Freq: Every day | ORAL | 0 refills | Status: DC

## 2023-01-06 NOTE — Plan of Care (Signed)
There is no changes from yesterday's plan at that time recommended oral diuretics, Lasix 40 mg daily oral.  Otherwise she is on room air.  Agree with discharge today cardiology will sign off.

## 2023-01-06 NOTE — TOC Transition Note (Signed)
Transition of Care [facility]) - CM/SW Discharge Note   Patient Details  Name: Dana Horton MRN: [id number] Date of Birth: 15-May-1945  [facility] CM/SW Contact:  Lawerance Sabal, RN Phone Number: 01/06/2023, 9:16 AM   Clinical Narrative:     Spoke w patient at bedside and provided with Jardience 1 month coupon.  No other TOC needs identified at this time for DC   Final next level of care: Home/Self Care Barriers to Discharge: No Barriers Identified   Patient Goals and CMS Choice CMS Medicare.gov Compare Post Acute Care list provided to:: Patient Represenative (must comment) Choice offered to / list presented to : Adult Children  Discharge Placement                         Discharge Plan and Services Additional resources added to the After Visit Summary for   In-house Referral: Clinical Social Work   Post Acute Care Choice: Home Health                               Social Determinants of Health (SDOH) Interventions SDOH Screenings   Food Insecurity: No Food Insecurity (12/25/2022)  Housing: Low Risk  (12/25/2022)  Transportation Needs: No Transportation Needs (12/25/2022)  Utilities: Not At Risk (12/25/2022)  Tobacco Use: High Risk (01/01/2023)     Readmission Risk Interventions    12/27/2022    6:36 PM 08/09/2022    9:38 AM  Readmission Risk Prevention Plan  Post Dischage Appt  Complete  Medication Screening  Complete  Transportation Screening Complete Complete  PCP or Specialist Appt within 5-7 Days Complete   Home Care Screening Complete   Medication Review (RN CM) Referral to Pharmacy

## 2023-01-06 NOTE — Discharge Summary (Signed)
Physician Discharge Summary  Dana Horton ZOX:[id number] DOB: 05-Sep-1945 DOA: 12/24/2022  PCP: Crist Fat, MD  Admit date: 12/24/2022 Discharge date: 01/06/2023  Time spent: 45 minutes  Recommendations for Outpatient Follow-up:  Cardiology Dr. Excell Seltzer on Wednesday for mitral clip Urology Dr. Berneice Heinrich for bladder CA and left kidney lesion   Discharge Diagnoses:  Principal Problem:   Acute on chronic diastolic CHF (congestive heart failure) (HCC) Active Problems:   Paroxysmal atrial fibrillation with RVR (HCC)   Essential hypertension   Stage 3a chronic kidney disease (HCC)   Chronic obstructive pulmonary disease (HCC)   OSA on CPAP   Acute on chronic anemia   Peripheral neuropathy   GERD without esophagitis   Nonrheumatic mitral (valve) insufficiency   Discharge Condition: Improved  Diet recommendation: Low sodium, heart healthy  Filed Weights   01/03/23 0520 01/04/23 0429 01/05/23 0448  Weight: 56.2 kg 56.1 kg 55.1 kg    History of present illness:  77/F w atrial fibrillation, CKD, COPD, GERD, hypertension, and OSA who presented with palpitations and generalized weakness. Apparently patient had not been taking metoprolol or apixaban. Because low blood pressure she was brought to the hospital. In the ED her blood pressure was 110/60, 96/63,  in Afib w/ bilateral rales and increased work of breathing,  CXR w/cardiomegaly, bilateral hilar vascular congestion, with small right pleural effusion.  -Patient was placed on IV furosemide, IV amiodarone and  Bipap.   -weaned off non invasive mechanical ventilation.  10/8 TEE with no atrial thrombus, severe aortic regurgitation, severe mitral regurgitation, moderate TR, LV systolic function preserved.  10/09 transfer from [facility] to [facility] for cardiac catheterization and cardiothoracic consultation.  RHC/LHC with normal cors, elevated wedge, hemodynamics of severe MR 10/10, CT chest with pulmonary edema, bilateral groundglass  opacities and pleural effusions, reactive adenopathy and nodule and Left Kidney lesion    Hospital Course:   Acute on chronic diastolic CHF  Severe mitral regurgitation Severe aortic insufficiency -Echo- EF 65 to 70%, moderate LVH, RV preserved, Severe MR, mod TR, mod AI -10/08 TEE with severe aortic regurgitation, severe mitral regurgitation, moderate TR -10/9 : RHC/LHC with normal coronaries, hemodynamics suggestive of severe mitral regurgitation -TCTS eval completed, not candidate for surgery, follow-up with structural heart team -Admitted with volume overload, diuresed with IV Lasix,  -volume status has improved, switched over to oral Lasix, continue metoprolol -SGLT2i and MRA held for hypotension -Weaned off O2, discharged home in a stable condition -Plan for mitral clip on Wednesday 10/16, preop labs drawn this morning prior to discharge   Paroxysmal atrial fibrillation with RVR (HCC) Continue amiodarone, metoprolol ,apixaban   Hypertensive urgency - weaned off nitroglycerin gtt -Continue with metoprolol.    Stage 3a chronic kidney disease (HCC) Hypokalemia,  -continue furosemide,   -Avoid hypotension   Chronic obstructive pulmonary disease (HCC) No signs of acute exacerbation.    Left Kidney lesion -noted on imaging, FU with Urology -Per Pt, she is well aware of this, the renal protocol CT from 5/24, suggests its a proteinaceous cyst, she's followed by Urology Dr.Manny who is aware abt this too  Bladder CA -FU with Urology Dr.Manny, has undergone multiple transurethral resection of bladder tumors   Pulm nodule -need FU   OSA on CPAP Home Cpap.    Acute on chronic anemia -Hemoglobin now down to 7.6, anemia panel this admission suggestive of chronic disease however previously throughout this year was indicative of iron deficiency  -Likely a combination of both, given IV iron and  Physician Discharge Summary  Dana Horton ZOX:[id number] DOB: 05-Sep-1945 DOA: 12/24/2022  PCP: Crist Fat, MD  Admit date: 12/24/2022 Discharge date: 01/06/2023  Time spent: 45 minutes  Recommendations for Outpatient Follow-up:  Cardiology Dr. Excell Seltzer on Wednesday for mitral clip Urology Dr. Berneice Heinrich for bladder CA and left kidney lesion   Discharge Diagnoses:  Principal Problem:   Acute on chronic diastolic CHF (congestive heart failure) (HCC) Active Problems:   Paroxysmal atrial fibrillation with RVR (HCC)   Essential hypertension   Stage 3a chronic kidney disease (HCC)   Chronic obstructive pulmonary disease (HCC)   OSA on CPAP   Acute on chronic anemia   Peripheral neuropathy   GERD without esophagitis   Nonrheumatic mitral (valve) insufficiency   Discharge Condition: Improved  Diet recommendation: Low sodium, heart healthy  Filed Weights   01/03/23 0520 01/04/23 0429 01/05/23 0448  Weight: 56.2 kg 56.1 kg 55.1 kg    History of present illness:  77/F w atrial fibrillation, CKD, COPD, GERD, hypertension, and OSA who presented with palpitations and generalized weakness. Apparently patient had not been taking metoprolol or apixaban. Because low blood pressure she was brought to the hospital. In the ED her blood pressure was 110/60, 96/63,  in Afib w/ bilateral rales and increased work of breathing,  CXR w/cardiomegaly, bilateral hilar vascular congestion, with small right pleural effusion.  -Patient was placed on IV furosemide, IV amiodarone and  Bipap.   -weaned off non invasive mechanical ventilation.  10/8 TEE with no atrial thrombus, severe aortic regurgitation, severe mitral regurgitation, moderate TR, LV systolic function preserved.  10/09 transfer from [facility] to [facility] for cardiac catheterization and cardiothoracic consultation.  RHC/LHC with normal cors, elevated wedge, hemodynamics of severe MR 10/10, CT chest with pulmonary edema, bilateral groundglass  opacities and pleural effusions, reactive adenopathy and nodule and Left Kidney lesion    Hospital Course:   Acute on chronic diastolic CHF  Severe mitral regurgitation Severe aortic insufficiency -Echo- EF 65 to 70%, moderate LVH, RV preserved, Severe MR, mod TR, mod AI -10/08 TEE with severe aortic regurgitation, severe mitral regurgitation, moderate TR -10/9 : RHC/LHC with normal coronaries, hemodynamics suggestive of severe mitral regurgitation -TCTS eval completed, not candidate for surgery, follow-up with structural heart team -Admitted with volume overload, diuresed with IV Lasix,  -volume status has improved, switched over to oral Lasix, continue metoprolol -SGLT2i and MRA held for hypotension -Weaned off O2, discharged home in a stable condition -Plan for mitral clip on Wednesday 10/16, preop labs drawn this morning prior to discharge   Paroxysmal atrial fibrillation with RVR (HCC) Continue amiodarone, metoprolol ,apixaban   Hypertensive urgency - weaned off nitroglycerin gtt -Continue with metoprolol.    Stage 3a chronic kidney disease (HCC) Hypokalemia,  -continue furosemide,   -Avoid hypotension   Chronic obstructive pulmonary disease (HCC) No signs of acute exacerbation.    Left Kidney lesion -noted on imaging, FU with Urology -Per Pt, she is well aware of this, the renal protocol CT from 5/24, suggests its a proteinaceous cyst, she's followed by Urology Dr.Manny who is aware abt this too  Bladder CA -FU with Urology Dr.Manny, has undergone multiple transurethral resection of bladder tumors   Pulm nodule -need FU   OSA on CPAP Home Cpap.    Acute on chronic anemia -Hemoglobin now down to 7.6, anemia panel this admission suggestive of chronic disease however previously throughout this year was indicative of iron deficiency  -Likely a combination of both, given IV iron and  09[birth year]                    BSA:          1.611 m Patient Age:    76 years                      BP:           102/33 mmHg Patient Gender: F                             HR:           72 bpm. Exam Location:  Inpatient Procedure: 2D Echo, Cardiac Doppler and Color Doppler Indications:    CHF  History:        Patient has  no prior history of Echocardiogram examinations.                 CHF, COPD, Arrythmias:Atrial Fibrillation; Risk                 Factors:Hypertension and Dyslipidemia.  Sonographer:    Melton Krebs RDCS, FE, PE Referring Phys: [id number] JAN A MANSY IMPRESSIONS  1. Left ventricular ejection fraction, by estimation, is 65 to 70%. The left ventricle has normal function. The left ventricle has no regional wall motion abnormalities. There is moderate asymmetric left ventricular hypertrophy of the basal-septal segment. Left ventricular diastolic parameters are consistent with Grade III diastolic dysfunction (restrictive).  2. Right ventricular systolic function is normal. The right ventricular size is normal. There is moderately elevated pulmonary artery systolic pressure. The estimated right ventricular systolic pressure is 59.3 mmHg.  3. Left atrial size was severely dilated.  4. Right atrial size was moderately dilated.  5. The mitral valve is thickened with mildly restricted leaflet motion. The left atrium is severely dialted with dialtion of the MV annulus. There is severe central MR. The mitral valve is abnormal. Severe mitral valve regurgitation. No evidence of mitral stenosis.  6. Tricuspid valve regurgitation is moderate.  7. The aortic valve is tricuspid. Aortic valve regurgitation is mild to moderate. No aortic stenosis is present. Aortic valve area, by VTI measures 2.45 cm. Aortic valve mean gradient measures 9.0 mmHg. Aortic valve Vmax measures 2.03 m/s.  8. The inferior vena cava is dilated in size with <50% respiratory variability, suggesting right atrial pressure of 15 mmHg. Conclusion(s)/Recommendation(s): Consider TEE to further evalaute valvular disease if clinically indicated. FINDINGS  Left Ventricle: Left ventricular ejection fraction, by estimation, is 65 to 70%. The left ventricle has normal function. The left ventricle has no regional wall motion abnormalities. The left ventricular internal cavity  size was normal in size. There is  moderate asymmetric left ventricular hypertrophy of the basal-septal segment. Left ventricular diastolic parameters are consistent with Grade III diastolic dysfunction (restrictive). Right Ventricle: The right ventricular size is normal. No increase in right ventricular wall thickness. Right ventricular systolic function is normal. There is moderately elevated pulmonary artery systolic pressure. The tricuspid regurgitant velocity is 3.51 m/s, and with an assumed right atrial pressure of 10 mmHg, the estimated right ventricular systolic pressure is 59.3 mmHg. Left Atrium: Left atrial size was severely dilated. Right Atrium: Right atrial size was moderately dilated. Pericardium: There is no evidence of pericardial effusion. Mitral Valve: The mitral valve is thickened with mildly restricted leaflet motion. The left atrium is severely dialted with dialtion of the MV annulus. There is severe central MR.  [birth year]                    BSA:          1.611 m Patient Age:    76 years                      BP:           102/33 mmHg Patient Gender: F                             HR:           72 bpm. Exam Location:  Inpatient Procedure: 2D Echo, Cardiac Doppler and Color Doppler Indications:    CHF  History:        Patient has  no prior history of Echocardiogram examinations.                 CHF, COPD, Arrythmias:Atrial Fibrillation; Risk                 Factors:Hypertension and Dyslipidemia.  Sonographer:    Melton Krebs RDCS, FE, PE Referring Phys: [id number] JAN A MANSY IMPRESSIONS  1. Left ventricular ejection fraction, by estimation, is 65 to 70%. The left ventricle has normal function. The left ventricle has no regional wall motion abnormalities. There is moderate asymmetric left ventricular hypertrophy of the basal-septal segment. Left ventricular diastolic parameters are consistent with Grade III diastolic dysfunction (restrictive).  2. Right ventricular systolic function is normal. The right ventricular size is normal. There is moderately elevated pulmonary artery systolic pressure. The estimated right ventricular systolic pressure is 59.3 mmHg.  3. Left atrial size was severely dilated.  4. Right atrial size was moderately dilated.  5. The mitral valve is thickened with mildly restricted leaflet motion. The left atrium is severely dialted with dialtion of the MV annulus. There is severe central MR. The mitral valve is abnormal. Severe mitral valve regurgitation. No evidence of mitral stenosis.  6. Tricuspid valve regurgitation is moderate.  7. The aortic valve is tricuspid. Aortic valve regurgitation is mild to moderate. No aortic stenosis is present. Aortic valve area, by VTI measures 2.45 cm. Aortic valve mean gradient measures 9.0 mmHg. Aortic valve Vmax measures 2.03 m/s.  8. The inferior vena cava is dilated in size with <50% respiratory variability, suggesting right atrial pressure of 15 mmHg. Conclusion(s)/Recommendation(s): Consider TEE to further evalaute valvular disease if clinically indicated. FINDINGS  Left Ventricle: Left ventricular ejection fraction, by estimation, is 65 to 70%. The left ventricle has normal function. The left ventricle has no regional wall motion abnormalities. The left ventricular internal cavity  size was normal in size. There is  moderate asymmetric left ventricular hypertrophy of the basal-septal segment. Left ventricular diastolic parameters are consistent with Grade III diastolic dysfunction (restrictive). Right Ventricle: The right ventricular size is normal. No increase in right ventricular wall thickness. Right ventricular systolic function is normal. There is moderately elevated pulmonary artery systolic pressure. The tricuspid regurgitant velocity is 3.51 m/s, and with an assumed right atrial pressure of 10 mmHg, the estimated right ventricular systolic pressure is 59.3 mmHg. Left Atrium: Left atrial size was severely dilated. Right Atrium: Right atrial size was moderately dilated. Pericardium: There is no evidence of pericardial effusion. Mitral Valve: The mitral valve is thickened with mildly restricted leaflet motion. The left atrium is severely dialted with dialtion of the MV annulus. There is severe central MR.  to right radial loop that did not allow advancement of a catheter  in spite of being able to navigate this with an 014 wire and use balloon assisted tracking.  The procedure was finished via the femoral approach.  Result Date: 01/02/2023 1.  Normal coronary arteries. 2.  Normal LV systolic function. 3.  Right heart catheterization showed normal RA pressure, moderate pulmonary hypertension, moderately elevated wedge pressure with giant V waves and normal cardiac output. Recommendations: There is hemodynamic evidence of severe mitral regurgitation. Continue oral diuresis and evaluation for mitral valve repair.   ECHO TEE  Result Date: 01/01/2023    TRANSESOPHOGEAL ECHO REPORT   Patient Name:   Dana Horton  [place] Date of Exam: 01/01/2023 Medical Rec #:  [id number]                     Height:       63.0 in Accession #:    [id number]                    Weight:       135.6 lb Date of Birth:  January 07, 1946                    BSA:          1.639 m Patient Age:    76 years                      BP:           116/52 mmHg Patient Gender: F                             HR:           88 bpm. Exam Location:  Inpatient Procedure: Transesophageal Echo, 3D Echo, Color Doppler and Cardiac Doppler Indications:     Mitral Regurgitation i34.0  History:         Patient has prior history of Echocardiogram examinations, most                  recent 12/25/2022. COPD, Arrythmias:Atrial Fibrillation; Risk                  Factors:Hypertension, Sleep Apnea and Dyslipidemia.  Sonographer:     Irving Burton Senior RDCS Referring Phys:  [id number] BRIDGETTE CHRISTOPHER Diagnosing Phys: Jodelle Red MD PROCEDURE: After discussion of the risks and benefits of a TEE, an informed consent was obtained from the patient. The transesophogeal probe was passed without difficulty through the esophogus of the patient. Sedation performed by different physician. The patient was monitored while under deep sedation. Anesthestetic sedation was provided intravenously by Anesthesiology: 97mg  of Propofol, 60mg  of Lidocaine. Image quality was good. The patient developed no complications during the procedure.  IMPRESSIONS  1. Left ventricular ejection fraction, by estimation, is 65 to 70%. The left ventricle has normal function.  2. Right ventricular systolic function is normal. The right ventricular size is normal. There is severely elevated pulmonary artery systolic pressure. The estimated right ventricular systolic pressure is 60.7 mmHg.  3. Left atrial size was severely dilated. No left atrial/left atrial appendage thrombus was detected.  4. Right atrial size was moderately dilated.  5. A small pericardial effusion is present. The pericardial effusion is  circumferential.  6. Flail P3 leaflet with possible cleft between P2-P3. The mitral valve is abnormal. Severe mitral valve regurgitation.  7. Tricuspid valve  to right radial loop that did not allow advancement of a catheter  in spite of being able to navigate this with an 014 wire and use balloon assisted tracking.  The procedure was finished via the femoral approach.  Result Date: 01/02/2023 1.  Normal coronary arteries. 2.  Normal LV systolic function. 3.  Right heart catheterization showed normal RA pressure, moderate pulmonary hypertension, moderately elevated wedge pressure with giant V waves and normal cardiac output. Recommendations: There is hemodynamic evidence of severe mitral regurgitation. Continue oral diuresis and evaluation for mitral valve repair.   ECHO TEE  Result Date: 01/01/2023    TRANSESOPHOGEAL ECHO REPORT   Patient Name:   Dana Horton  [place] Date of Exam: 01/01/2023 Medical Rec #:  [id number]                     Height:       63.0 in Accession #:    [id number]                    Weight:       135.6 lb Date of Birth:  January 07, 1946                    BSA:          1.639 m Patient Age:    76 years                      BP:           116/52 mmHg Patient Gender: F                             HR:           88 bpm. Exam Location:  Inpatient Procedure: Transesophageal Echo, 3D Echo, Color Doppler and Cardiac Doppler Indications:     Mitral Regurgitation i34.0  History:         Patient has prior history of Echocardiogram examinations, most                  recent 12/25/2022. COPD, Arrythmias:Atrial Fibrillation; Risk                  Factors:Hypertension, Sleep Apnea and Dyslipidemia.  Sonographer:     Irving Burton Senior RDCS Referring Phys:  [id number] BRIDGETTE CHRISTOPHER Diagnosing Phys: Jodelle Red MD PROCEDURE: After discussion of the risks and benefits of a TEE, an informed consent was obtained from the patient. The transesophogeal probe was passed without difficulty through the esophogus of the patient. Sedation performed by different physician. The patient was monitored while under deep sedation. Anesthestetic sedation was provided intravenously by Anesthesiology: 97mg  of Propofol, 60mg  of Lidocaine. Image quality was good. The patient developed no complications during the procedure.  IMPRESSIONS  1. Left ventricular ejection fraction, by estimation, is 65 to 70%. The left ventricle has normal function.  2. Right ventricular systolic function is normal. The right ventricular size is normal. There is severely elevated pulmonary artery systolic pressure. The estimated right ventricular systolic pressure is 60.7 mmHg.  3. Left atrial size was severely dilated. No left atrial/left atrial appendage thrombus was detected.  4. Right atrial size was moderately dilated.  5. A small pericardial effusion is present. The pericardial effusion is  circumferential.  6. Flail P3 leaflet with possible cleft between P2-P3. The mitral valve is abnormal. Severe mitral valve regurgitation.  7. Tricuspid valve  Physician Discharge Summary  Dana Horton ZOX:[id number] DOB: 05-Sep-1945 DOA: 12/24/2022  PCP: Crist Fat, MD  Admit date: 12/24/2022 Discharge date: 01/06/2023  Time spent: 45 minutes  Recommendations for Outpatient Follow-up:  Cardiology Dr. Excell Seltzer on Wednesday for mitral clip Urology Dr. Berneice Heinrich for bladder CA and left kidney lesion   Discharge Diagnoses:  Principal Problem:   Acute on chronic diastolic CHF (congestive heart failure) (HCC) Active Problems:   Paroxysmal atrial fibrillation with RVR (HCC)   Essential hypertension   Stage 3a chronic kidney disease (HCC)   Chronic obstructive pulmonary disease (HCC)   OSA on CPAP   Acute on chronic anemia   Peripheral neuropathy   GERD without esophagitis   Nonrheumatic mitral (valve) insufficiency   Discharge Condition: Improved  Diet recommendation: Low sodium, heart healthy  Filed Weights   01/03/23 0520 01/04/23 0429 01/05/23 0448  Weight: 56.2 kg 56.1 kg 55.1 kg    History of present illness:  77/F w atrial fibrillation, CKD, COPD, GERD, hypertension, and OSA who presented with palpitations and generalized weakness. Apparently patient had not been taking metoprolol or apixaban. Because low blood pressure she was brought to the hospital. In the ED her blood pressure was 110/60, 96/63,  in Afib w/ bilateral rales and increased work of breathing,  CXR w/cardiomegaly, bilateral hilar vascular congestion, with small right pleural effusion.  -Patient was placed on IV furosemide, IV amiodarone and  Bipap.   -weaned off non invasive mechanical ventilation.  10/8 TEE with no atrial thrombus, severe aortic regurgitation, severe mitral regurgitation, moderate TR, LV systolic function preserved.  10/09 transfer from [facility] to [facility] for cardiac catheterization and cardiothoracic consultation.  RHC/LHC with normal cors, elevated wedge, hemodynamics of severe MR 10/10, CT chest with pulmonary edema, bilateral groundglass  opacities and pleural effusions, reactive adenopathy and nodule and Left Kidney lesion    Hospital Course:   Acute on chronic diastolic CHF  Severe mitral regurgitation Severe aortic insufficiency -Echo- EF 65 to 70%, moderate LVH, RV preserved, Severe MR, mod TR, mod AI -10/08 TEE with severe aortic regurgitation, severe mitral regurgitation, moderate TR -10/9 : RHC/LHC with normal coronaries, hemodynamics suggestive of severe mitral regurgitation -TCTS eval completed, not candidate for surgery, follow-up with structural heart team -Admitted with volume overload, diuresed with IV Lasix,  -volume status has improved, switched over to oral Lasix, continue metoprolol -SGLT2i and MRA held for hypotension -Weaned off O2, discharged home in a stable condition -Plan for mitral clip on Wednesday 10/16, preop labs drawn this morning prior to discharge   Paroxysmal atrial fibrillation with RVR (HCC) Continue amiodarone, metoprolol ,apixaban   Hypertensive urgency - weaned off nitroglycerin gtt -Continue with metoprolol.    Stage 3a chronic kidney disease (HCC) Hypokalemia,  -continue furosemide,   -Avoid hypotension   Chronic obstructive pulmonary disease (HCC) No signs of acute exacerbation.    Left Kidney lesion -noted on imaging, FU with Urology -Per Pt, she is well aware of this, the renal protocol CT from 5/24, suggests its a proteinaceous cyst, she's followed by Urology Dr.Manny who is aware abt this too  Bladder CA -FU with Urology Dr.Manny, has undergone multiple transurethral resection of bladder tumors   Pulm nodule -need FU   OSA on CPAP Home Cpap.    Acute on chronic anemia -Hemoglobin now down to 7.6, anemia panel this admission suggestive of chronic disease however previously throughout this year was indicative of iron deficiency  -Likely a combination of both, given IV iron and  to right radial loop that did not allow advancement of a catheter  in spite of being able to navigate this with an 014 wire and use balloon assisted tracking.  The procedure was finished via the femoral approach.  Result Date: 01/02/2023 1.  Normal coronary arteries. 2.  Normal LV systolic function. 3.  Right heart catheterization showed normal RA pressure, moderate pulmonary hypertension, moderately elevated wedge pressure with giant V waves and normal cardiac output. Recommendations: There is hemodynamic evidence of severe mitral regurgitation. Continue oral diuresis and evaluation for mitral valve repair.   ECHO TEE  Result Date: 01/01/2023    TRANSESOPHOGEAL ECHO REPORT   Patient Name:   Dana Horton  [place] Date of Exam: 01/01/2023 Medical Rec #:  [id number]                     Height:       63.0 in Accession #:    [id number]                    Weight:       135.6 lb Date of Birth:  January 07, 1946                    BSA:          1.639 m Patient Age:    76 years                      BP:           116/52 mmHg Patient Gender: F                             HR:           88 bpm. Exam Location:  Inpatient Procedure: Transesophageal Echo, 3D Echo, Color Doppler and Cardiac Doppler Indications:     Mitral Regurgitation i34.0  History:         Patient has prior history of Echocardiogram examinations, most                  recent 12/25/2022. COPD, Arrythmias:Atrial Fibrillation; Risk                  Factors:Hypertension, Sleep Apnea and Dyslipidemia.  Sonographer:     Irving Burton Senior RDCS Referring Phys:  [id number] BRIDGETTE CHRISTOPHER Diagnosing Phys: Jodelle Red MD PROCEDURE: After discussion of the risks and benefits of a TEE, an informed consent was obtained from the patient. The transesophogeal probe was passed without difficulty through the esophogus of the patient. Sedation performed by different physician. The patient was monitored while under deep sedation. Anesthestetic sedation was provided intravenously by Anesthesiology: 97mg  of Propofol, 60mg  of Lidocaine. Image quality was good. The patient developed no complications during the procedure.  IMPRESSIONS  1. Left ventricular ejection fraction, by estimation, is 65 to 70%. The left ventricle has normal function.  2. Right ventricular systolic function is normal. The right ventricular size is normal. There is severely elevated pulmonary artery systolic pressure. The estimated right ventricular systolic pressure is 60.7 mmHg.  3. Left atrial size was severely dilated. No left atrial/left atrial appendage thrombus was detected.  4. Right atrial size was moderately dilated.  5. A small pericardial effusion is present. The pericardial effusion is  circumferential.  6. Flail P3 leaflet with possible cleft between P2-P3. The mitral valve is abnormal. Severe mitral valve regurgitation.  7. Tricuspid valve  Physician Discharge Summary  Dana Horton ZOX:[id number] DOB: 05-Sep-1945 DOA: 12/24/2022  PCP: Crist Fat, MD  Admit date: 12/24/2022 Discharge date: 01/06/2023  Time spent: 45 minutes  Recommendations for Outpatient Follow-up:  Cardiology Dr. Excell Seltzer on Wednesday for mitral clip Urology Dr. Berneice Heinrich for bladder CA and left kidney lesion   Discharge Diagnoses:  Principal Problem:   Acute on chronic diastolic CHF (congestive heart failure) (HCC) Active Problems:   Paroxysmal atrial fibrillation with RVR (HCC)   Essential hypertension   Stage 3a chronic kidney disease (HCC)   Chronic obstructive pulmonary disease (HCC)   OSA on CPAP   Acute on chronic anemia   Peripheral neuropathy   GERD without esophagitis   Nonrheumatic mitral (valve) insufficiency   Discharge Condition: Improved  Diet recommendation: Low sodium, heart healthy  Filed Weights   01/03/23 0520 01/04/23 0429 01/05/23 0448  Weight: 56.2 kg 56.1 kg 55.1 kg    History of present illness:  77/F w atrial fibrillation, CKD, COPD, GERD, hypertension, and OSA who presented with palpitations and generalized weakness. Apparently patient had not been taking metoprolol or apixaban. Because low blood pressure she was brought to the hospital. In the ED her blood pressure was 110/60, 96/63,  in Afib w/ bilateral rales and increased work of breathing,  CXR w/cardiomegaly, bilateral hilar vascular congestion, with small right pleural effusion.  -Patient was placed on IV furosemide, IV amiodarone and  Bipap.   -weaned off non invasive mechanical ventilation.  10/8 TEE with no atrial thrombus, severe aortic regurgitation, severe mitral regurgitation, moderate TR, LV systolic function preserved.  10/09 transfer from [facility] to [facility] for cardiac catheterization and cardiothoracic consultation.  RHC/LHC with normal cors, elevated wedge, hemodynamics of severe MR 10/10, CT chest with pulmonary edema, bilateral groundglass  opacities and pleural effusions, reactive adenopathy and nodule and Left Kidney lesion    Hospital Course:   Acute on chronic diastolic CHF  Severe mitral regurgitation Severe aortic insufficiency -Echo- EF 65 to 70%, moderate LVH, RV preserved, Severe MR, mod TR, mod AI -10/08 TEE with severe aortic regurgitation, severe mitral regurgitation, moderate TR -10/9 : RHC/LHC with normal coronaries, hemodynamics suggestive of severe mitral regurgitation -TCTS eval completed, not candidate for surgery, follow-up with structural heart team -Admitted with volume overload, diuresed with IV Lasix,  -volume status has improved, switched over to oral Lasix, continue metoprolol -SGLT2i and MRA held for hypotension -Weaned off O2, discharged home in a stable condition -Plan for mitral clip on Wednesday 10/16, preop labs drawn this morning prior to discharge   Paroxysmal atrial fibrillation with RVR (HCC) Continue amiodarone, metoprolol ,apixaban   Hypertensive urgency - weaned off nitroglycerin gtt -Continue with metoprolol.    Stage 3a chronic kidney disease (HCC) Hypokalemia,  -continue furosemide,   -Avoid hypotension   Chronic obstructive pulmonary disease (HCC) No signs of acute exacerbation.    Left Kidney lesion -noted on imaging, FU with Urology -Per Pt, she is well aware of this, the renal protocol CT from 5/24, suggests its a proteinaceous cyst, she's followed by Urology Dr.Manny who is aware abt this too  Bladder CA -FU with Urology Dr.Manny, has undergone multiple transurethral resection of bladder tumors   Pulm nodule -need FU   OSA on CPAP Home Cpap.    Acute on chronic anemia -Hemoglobin now down to 7.6, anemia panel this admission suggestive of chronic disease however previously throughout this year was indicative of iron deficiency  -Likely a combination of both, given IV iron and

## 2023-01-07 ENCOUNTER — Telehealth: Payer: Self-pay

## 2023-01-07 ENCOUNTER — Encounter: Payer: Self-pay | Admitting: Internal Medicine

## 2023-01-07 NOTE — Telephone Encounter (Signed)
Called patient to discuss mTEER planning. Scheduled her for check-up with Dr. Excell Seltzer 01/10/2023 in preparation for MitraClip procedure 01/16/2023. She states she is feeling well after discharge. She understands to call prior to visit if she experiences symptoms. She was grateful for call and agreed with plan.

## 2023-01-08 ENCOUNTER — Other Ambulatory Visit: Payer: Self-pay

## 2023-01-08 DIAGNOSIS — I340 Nonrheumatic mitral (valve) insufficiency: Secondary | ICD-10-CM

## 2023-01-09 ENCOUNTER — Ambulatory Visit: Payer: Medicare Other | Admitting: Cardiovascular Disease

## 2023-01-10 ENCOUNTER — Ambulatory Visit: Payer: Medicare Other | Attending: Cardiovascular Disease | Admitting: Cardiovascular Disease

## 2023-01-10 ENCOUNTER — Encounter: Payer: Self-pay | Admitting: Cardiovascular Disease

## 2023-01-10 VITALS — BP 110/76 | HR 58 | Ht 63.0 in | Wt 120.8 lb

## 2023-01-10 DIAGNOSIS — I480 Paroxysmal atrial fibrillation: Secondary | ICD-10-CM | POA: Diagnosis not present

## 2023-01-10 DIAGNOSIS — I340 Nonrheumatic mitral (valve) insufficiency: Secondary | ICD-10-CM | POA: Diagnosis not present

## 2023-01-10 NOTE — Assessment & Plan Note (Signed)
Maintaining sinus rhythm on amiodarone.  She is given instructions about holding apixaban before the MitraClip procedure.  Might consider reducing her amiodarone to 100 mg daily since she is experiencing significant nausea and and anorexia.  Will review amiodarone dosing after her MitraClip procedure.

## 2023-01-10 NOTE — Progress Notes (Signed)
Cardiology Office Note:    Date:  01/10/2023   ID:  Garvin Fila Hillman, DOB [birth year], MRN [id number]  PCP:  Crist Fat, MD   [facility] Providers Cardiologist:  Norman Herrlich, MD     Referring MD: Leonia Reader, Barbara Cower, MD   Chief Complaint  Patient presents with   Mitral Regurgitation    History of Present Illness:    Dana Horton is a 77 y.o. female presenting for hospital follow-up of severe mitral regurgitation and heart failure.  The patient was last seen in the outpatient setting by Dr. Dulce Sellar in May of this year.  At that time her cardiovascular issues included paroxysmal atrial fibrillation, anemia, and hypotension.  There were some adjustments made in her medications.  She was then admitted September 30 through October 13 with acute on chronic diastolic heart failure, paroxysmal atrial fibrillation, and severe mitral regurgitation.  She required BiPAP, IV amiodarone, and aggressive diuresis with IV furosemide.  She was found to have severe valvular heart disease with severe mitral and aortic regurgitation as well as moderate tricuspid regurgitation.  She was seen by cardiac surgery and was deemed a nonoperative candidate due to her severe comorbidities.  Structural heart team evaluated her for consideration of transcatheter edge-to-edge repair of the mitral valve.  She underwent right and left heart catheterization showing angiographically normal coronary arteries and cardiac hemodynamics that confirmed severe mitral regurgitation with giant V waves in the wedge tracing.  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  She returns today to discuss treatment options and consideration of MitraClip.  The patient is here with her daughter.  She is feeling a little better than when she was hospitalized last week.  Her breathing has improved.  She is experiencing nausea and decreased appetite since hospital discharge.  She has had no chest  pain or pressure.  She denies edema, orthopnea, or PND.  She is still short of breath with activity and she also complains of generalized weakness.   Current Medications: Current Meds  Medication Sig   acetaminophen (TYLENOL) 500 MG tablet Take 500 mg by mouth every 6 (six) hours as needed for moderate pain or headache.   amiodarone (PACERONE) 200 MG tablet Take 1 tablet (200 mg total) by mouth daily.   apixaban (ELIQUIS) 5 MG TABS tablet Take 1 tablet (5 mg total) by mouth 2 (two) times daily.   docusate sodium (COLACE) 100 MG capsule Take 100 mg by mouth daily.   empagliflozin (JARDIANCE) 10 MG TABS tablet Take 1 tablet (10 mg total) by mouth daily.   Ferric Maltol 30 MG CAPS Take 1 capsule (30 mg total) by mouth daily.   fluticasone (FLONASE) 50 MCG/ACT nasal spray Place 2 sprays into both nostrils daily.   furosemide (LASIX) 40 MG tablet Take 1 tablet (40 mg total) by mouth daily.   levalbuterol (XOPENEX HFA) 45 MCG/ACT inhaler Inhale 1-2 puffs into the lungs every 4 (four) hours as needed for wheezing. (Patient taking differently: Inhale 1-2 puffs into the lungs 2 (two) times daily.)   metoprolol tartrate (LOPRESSOR) 25 MG tablet Take 0.5 tablets (12.5 mg total) by mouth 2 (two) times daily.   midodrine (PROAMATINE) 10 MG tablet Take 1 tablet (10 mg total) by mouth 2 (two) times daily with a meal.   montelukast (SINGULAIR) 10 MG tablet Take 1 tablet (10 mg total) by mouth every evening.   pantoprazole (PROTONIX) 40 MG tablet Take 1 tablet (40 mg total)  Cardiology Office Note:    Date:  01/10/2023   ID:  Garvin Fila Hillman, DOB [birth year], MRN [id number]  PCP:  Crist Fat, MD   [facility] Providers Cardiologist:  Norman Herrlich, MD     Referring MD: Leonia Reader, Barbara Cower, MD   Chief Complaint  Patient presents with   Mitral Regurgitation    History of Present Illness:    Dana Horton is a 77 y.o. female presenting for hospital follow-up of severe mitral regurgitation and heart failure.  The patient was last seen in the outpatient setting by Dr. Dulce Sellar in May of this year.  At that time her cardiovascular issues included paroxysmal atrial fibrillation, anemia, and hypotension.  There were some adjustments made in her medications.  She was then admitted September 30 through October 13 with acute on chronic diastolic heart failure, paroxysmal atrial fibrillation, and severe mitral regurgitation.  She required BiPAP, IV amiodarone, and aggressive diuresis with IV furosemide.  She was found to have severe valvular heart disease with severe mitral and aortic regurgitation as well as moderate tricuspid regurgitation.  She was seen by cardiac surgery and was deemed a nonoperative candidate due to her severe comorbidities.  Structural heart team evaluated her for consideration of transcatheter edge-to-edge repair of the mitral valve.  She underwent right and left heart catheterization showing angiographically normal coronary arteries and cardiac hemodynamics that confirmed severe mitral regurgitation with giant V waves in the wedge tracing.  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  She returns today to discuss treatment options and consideration of MitraClip.  The patient is here with her daughter.  She is feeling a little better than when she was hospitalized last week.  Her breathing has improved.  She is experiencing nausea and decreased appetite since hospital discharge.  She has had no chest  pain or pressure.  She denies edema, orthopnea, or PND.  She is still short of breath with activity and she also complains of generalized weakness.   Current Medications: Current Meds  Medication Sig   acetaminophen (TYLENOL) 500 MG tablet Take 500 mg by mouth every 6 (six) hours as needed for moderate pain or headache.   amiodarone (PACERONE) 200 MG tablet Take 1 tablet (200 mg total) by mouth daily.   apixaban (ELIQUIS) 5 MG TABS tablet Take 1 tablet (5 mg total) by mouth 2 (two) times daily.   docusate sodium (COLACE) 100 MG capsule Take 100 mg by mouth daily.   empagliflozin (JARDIANCE) 10 MG TABS tablet Take 1 tablet (10 mg total) by mouth daily.   Ferric Maltol 30 MG CAPS Take 1 capsule (30 mg total) by mouth daily.   fluticasone (FLONASE) 50 MCG/ACT nasal spray Place 2 sprays into both nostrils daily.   furosemide (LASIX) 40 MG tablet Take 1 tablet (40 mg total) by mouth daily.   levalbuterol (XOPENEX HFA) 45 MCG/ACT inhaler Inhale 1-2 puffs into the lungs every 4 (four) hours as needed for wheezing. (Patient taking differently: Inhale 1-2 puffs into the lungs 2 (two) times daily.)   metoprolol tartrate (LOPRESSOR) 25 MG tablet Take 0.5 tablets (12.5 mg total) by mouth 2 (two) times daily.   midodrine (PROAMATINE) 10 MG tablet Take 1 tablet (10 mg total) by mouth 2 (two) times daily with a meal.   montelukast (SINGULAIR) 10 MG tablet Take 1 tablet (10 mg total) by mouth every evening.   pantoprazole (PROTONIX) 40 MG tablet Take 1 tablet (40 mg total)  Cardiology Office Note:    Date:  01/10/2023   ID:  Garvin Fila Hillman, DOB [birth year], MRN [id number]  PCP:  Crist Fat, MD   [facility] Providers Cardiologist:  Norman Herrlich, MD     Referring MD: Leonia Reader, Barbara Cower, MD   Chief Complaint  Patient presents with   Mitral Regurgitation    History of Present Illness:    Dana Horton is a 77 y.o. female presenting for hospital follow-up of severe mitral regurgitation and heart failure.  The patient was last seen in the outpatient setting by Dr. Dulce Sellar in May of this year.  At that time her cardiovascular issues included paroxysmal atrial fibrillation, anemia, and hypotension.  There were some adjustments made in her medications.  She was then admitted September 30 through October 13 with acute on chronic diastolic heart failure, paroxysmal atrial fibrillation, and severe mitral regurgitation.  She required BiPAP, IV amiodarone, and aggressive diuresis with IV furosemide.  She was found to have severe valvular heart disease with severe mitral and aortic regurgitation as well as moderate tricuspid regurgitation.  She was seen by cardiac surgery and was deemed a nonoperative candidate due to her severe comorbidities.  Structural heart team evaluated her for consideration of transcatheter edge-to-edge repair of the mitral valve.  She underwent right and left heart catheterization showing angiographically normal coronary arteries and cardiac hemodynamics that confirmed severe mitral regurgitation with giant V waves in the wedge tracing.  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  She returns today to discuss treatment options and consideration of MitraClip.  The patient is here with her daughter.  She is feeling a little better than when she was hospitalized last week.  Her breathing has improved.  She is experiencing nausea and decreased appetite since hospital discharge.  She has had no chest  pain or pressure.  She denies edema, orthopnea, or PND.  She is still short of breath with activity and she also complains of generalized weakness.   Current Medications: Current Meds  Medication Sig   acetaminophen (TYLENOL) 500 MG tablet Take 500 mg by mouth every 6 (six) hours as needed for moderate pain or headache.   amiodarone (PACERONE) 200 MG tablet Take 1 tablet (200 mg total) by mouth daily.   apixaban (ELIQUIS) 5 MG TABS tablet Take 1 tablet (5 mg total) by mouth 2 (two) times daily.   docusate sodium (COLACE) 100 MG capsule Take 100 mg by mouth daily.   empagliflozin (JARDIANCE) 10 MG TABS tablet Take 1 tablet (10 mg total) by mouth daily.   Ferric Maltol 30 MG CAPS Take 1 capsule (30 mg total) by mouth daily.   fluticasone (FLONASE) 50 MCG/ACT nasal spray Place 2 sprays into both nostrils daily.   furosemide (LASIX) 40 MG tablet Take 1 tablet (40 mg total) by mouth daily.   levalbuterol (XOPENEX HFA) 45 MCG/ACT inhaler Inhale 1-2 puffs into the lungs every 4 (four) hours as needed for wheezing. (Patient taking differently: Inhale 1-2 puffs into the lungs 2 (two) times daily.)   metoprolol tartrate (LOPRESSOR) 25 MG tablet Take 0.5 tablets (12.5 mg total) by mouth 2 (two) times daily.   midodrine (PROAMATINE) 10 MG tablet Take 1 tablet (10 mg total) by mouth 2 (two) times daily with a meal.   montelukast (SINGULAIR) 10 MG tablet Take 1 tablet (10 mg total) by mouth every evening.   pantoprazole (PROTONIX) 40 MG tablet Take 1 tablet (40 mg total)  Cardiology Office Note:    Date:  01/10/2023   ID:  Garvin Fila Hillman, DOB [birth year], MRN [id number]  PCP:  Crist Fat, MD   [facility] Providers Cardiologist:  Norman Herrlich, MD     Referring MD: Leonia Reader, Barbara Cower, MD   Chief Complaint  Patient presents with   Mitral Regurgitation    History of Present Illness:    Dana Horton is a 77 y.o. female presenting for hospital follow-up of severe mitral regurgitation and heart failure.  The patient was last seen in the outpatient setting by Dr. Dulce Sellar in May of this year.  At that time her cardiovascular issues included paroxysmal atrial fibrillation, anemia, and hypotension.  There were some adjustments made in her medications.  She was then admitted September 30 through October 13 with acute on chronic diastolic heart failure, paroxysmal atrial fibrillation, and severe mitral regurgitation.  She required BiPAP, IV amiodarone, and aggressive diuresis with IV furosemide.  She was found to have severe valvular heart disease with severe mitral and aortic regurgitation as well as moderate tricuspid regurgitation.  She was seen by cardiac surgery and was deemed a nonoperative candidate due to her severe comorbidities.  Structural heart team evaluated her for consideration of transcatheter edge-to-edge repair of the mitral valve.  She underwent right and left heart catheterization showing angiographically normal coronary arteries and cardiac hemodynamics that confirmed severe mitral regurgitation with giant V waves in the wedge tracing.  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  She returns today to discuss treatment options and consideration of MitraClip.  The patient is here with her daughter.  She is feeling a little better than when she was hospitalized last week.  Her breathing has improved.  She is experiencing nausea and decreased appetite since hospital discharge.  She has had no chest  pain or pressure.  She denies edema, orthopnea, or PND.  She is still short of breath with activity and she also complains of generalized weakness.   Current Medications: Current Meds  Medication Sig   acetaminophen (TYLENOL) 500 MG tablet Take 500 mg by mouth every 6 (six) hours as needed for moderate pain or headache.   amiodarone (PACERONE) 200 MG tablet Take 1 tablet (200 mg total) by mouth daily.   apixaban (ELIQUIS) 5 MG TABS tablet Take 1 tablet (5 mg total) by mouth 2 (two) times daily.   docusate sodium (COLACE) 100 MG capsule Take 100 mg by mouth daily.   empagliflozin (JARDIANCE) 10 MG TABS tablet Take 1 tablet (10 mg total) by mouth daily.   Ferric Maltol 30 MG CAPS Take 1 capsule (30 mg total) by mouth daily.   fluticasone (FLONASE) 50 MCG/ACT nasal spray Place 2 sprays into both nostrils daily.   furosemide (LASIX) 40 MG tablet Take 1 tablet (40 mg total) by mouth daily.   levalbuterol (XOPENEX HFA) 45 MCG/ACT inhaler Inhale 1-2 puffs into the lungs every 4 (four) hours as needed for wheezing. (Patient taking differently: Inhale 1-2 puffs into the lungs 2 (two) times daily.)   metoprolol tartrate (LOPRESSOR) 25 MG tablet Take 0.5 tablets (12.5 mg total) by mouth 2 (two) times daily.   midodrine (PROAMATINE) 10 MG tablet Take 1 tablet (10 mg total) by mouth 2 (two) times daily with a meal.   montelukast (SINGULAIR) 10 MG tablet Take 1 tablet (10 mg total) by mouth every evening.   pantoprazole (PROTONIX) 40 MG tablet Take 1 tablet (40 mg total)  [id number]                    Weight:       130.1 lb Date of Birth:  1946/03/07                    BSA:          1.611 m Patient Age:    76 years                      BP:           102/33 mmHg Patient Gender: F                             HR:           72 bpm. Exam Location:  Inpatient  Procedure: 2D Echo, Cardiac Doppler and Color Doppler  Indications:    CHF  History:        Patient has no prior history of Echocardiogram examinations. CHF, COPD, Arrythmias:Atrial Fibrillation; Risk Factors:Hypertension and Dyslipidemia.  Sonographer:    Melton Krebs RDCS, FE, PE Referring Phys: [id number] JAN A MANSY  IMPRESSIONS   1. Left ventricular ejection fraction, by estimation, is 65 to 70%. The left ventricle  has normal function. The left ventricle has no regional wall motion abnormalities. There is moderate asymmetric left ventricular hypertrophy of the basal-septal segment. Left ventricular diastolic parameters are consistent with Grade III diastolic dysfunction (restrictive). 2. Right ventricular systolic function is normal. The right ventricular size is normal. There is moderately elevated pulmonary artery systolic pressure. The estimated right ventricular systolic pressure is 59.3 mmHg. 3. Left atrial size was severely dilated. 4. Right atrial size was moderately dilated. 5. The mitral valve is thickened with mildly restricted leaflet motion. The left atrium is severely dialted with dialtion of the MV annulus. There is severe central MR. The mitral valve is abnormal. Severe mitral valve regurgitation. No evidence of mitral stenosis. 6. Tricuspid valve regurgitation is moderate. 7. The aortic valve is tricuspid. Aortic valve regurgitation is mild to moderate. No aortic stenosis is present. Aortic valve area, by VTI measures 2.45 cm. Aortic valve mean gradient measures 9.0 mmHg. Aortic valve Vmax measures 2.03 m/s. 8. The inferior vena cava is dilated in size with <50% respiratory variability, suggesting right atrial pressure of 15 mmHg.  Conclusion(s)/Recommendation(s): Consider TEE to further evalaute valvular disease if clinically indicated.  FINDINGS Left Ventricle: Left ventricular ejection fraction, by estimation, is 65 to 70%. The left ventricle has normal function. The left ventricle has no regional wall motion abnormalities. The left ventricular internal cavity size was normal in size. There is moderate asymmetric left ventricular hypertrophy of the basal-septal segment. Left ventricular diastolic parameters are consistent with Grade III diastolic dysfunction (restrictive).  Right Ventricle: The right ventricular size is normal. No increase in right ventricular wall thickness. Right  ventricular systolic function is normal. There is moderately elevated pulmonary artery systolic pressure. The tricuspid regurgitant velocity is 3.51 m/s, and with an assumed right atrial pressure of 10 mmHg, the estimated right ventricular systolic pressure is 59.3 mmHg.  Left Atrium: Left atrial size was severely dilated.  Right Atrium: Right atrial size was moderately dilated.  Pericardium: There is no evidence of pericardial effusion.  Mitral Valve: The mitral valve is thickened with mildly restricted leaflet motion. The left atrium is severely dialted with dialtion of the MV annulus. There is  Cardiology Office Note:    Date:  01/10/2023   ID:  Garvin Fila Hillman, DOB [birth year], MRN [id number]  PCP:  Crist Fat, MD   [facility] Providers Cardiologist:  Norman Herrlich, MD     Referring MD: Leonia Reader, Barbara Cower, MD   Chief Complaint  Patient presents with   Mitral Regurgitation    History of Present Illness:    Dana Horton is a 77 y.o. female presenting for hospital follow-up of severe mitral regurgitation and heart failure.  The patient was last seen in the outpatient setting by Dr. Dulce Sellar in May of this year.  At that time her cardiovascular issues included paroxysmal atrial fibrillation, anemia, and hypotension.  There were some adjustments made in her medications.  She was then admitted September 30 through October 13 with acute on chronic diastolic heart failure, paroxysmal atrial fibrillation, and severe mitral regurgitation.  She required BiPAP, IV amiodarone, and aggressive diuresis with IV furosemide.  She was found to have severe valvular heart disease with severe mitral and aortic regurgitation as well as moderate tricuspid regurgitation.  She was seen by cardiac surgery and was deemed a nonoperative candidate due to her severe comorbidities.  Structural heart team evaluated her for consideration of transcatheter edge-to-edge repair of the mitral valve.  She underwent right and left heart catheterization showing angiographically normal coronary arteries and cardiac hemodynamics that confirmed severe mitral regurgitation with giant V waves in the wedge tracing.  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  She returns today to discuss treatment options and consideration of MitraClip.  The patient is here with her daughter.  She is feeling a little better than when she was hospitalized last week.  Her breathing has improved.  She is experiencing nausea and decreased appetite since hospital discharge.  She has had no chest  pain or pressure.  She denies edema, orthopnea, or PND.  She is still short of breath with activity and she also complains of generalized weakness.   Current Medications: Current Meds  Medication Sig   acetaminophen (TYLENOL) 500 MG tablet Take 500 mg by mouth every 6 (six) hours as needed for moderate pain or headache.   amiodarone (PACERONE) 200 MG tablet Take 1 tablet (200 mg total) by mouth daily.   apixaban (ELIQUIS) 5 MG TABS tablet Take 1 tablet (5 mg total) by mouth 2 (two) times daily.   docusate sodium (COLACE) 100 MG capsule Take 100 mg by mouth daily.   empagliflozin (JARDIANCE) 10 MG TABS tablet Take 1 tablet (10 mg total) by mouth daily.   Ferric Maltol 30 MG CAPS Take 1 capsule (30 mg total) by mouth daily.   fluticasone (FLONASE) 50 MCG/ACT nasal spray Place 2 sprays into both nostrils daily.   furosemide (LASIX) 40 MG tablet Take 1 tablet (40 mg total) by mouth daily.   levalbuterol (XOPENEX HFA) 45 MCG/ACT inhaler Inhale 1-2 puffs into the lungs every 4 (four) hours as needed for wheezing. (Patient taking differently: Inhale 1-2 puffs into the lungs 2 (two) times daily.)   metoprolol tartrate (LOPRESSOR) 25 MG tablet Take 0.5 tablets (12.5 mg total) by mouth 2 (two) times daily.   midodrine (PROAMATINE) 10 MG tablet Take 1 tablet (10 mg total) by mouth 2 (two) times daily with a meal.   montelukast (SINGULAIR) 10 MG tablet Take 1 tablet (10 mg total) by mouth every evening.   pantoprazole (PROTONIX) 40 MG tablet Take 1 tablet (40 mg total)  Cardiology Office Note:    Date:  01/10/2023   ID:  Garvin Fila Hillman, DOB [birth year], MRN [id number]  PCP:  Crist Fat, MD   [facility] Providers Cardiologist:  Norman Herrlich, MD     Referring MD: Leonia Reader, Barbara Cower, MD   Chief Complaint  Patient presents with   Mitral Regurgitation    History of Present Illness:    Dana Horton is a 77 y.o. female presenting for hospital follow-up of severe mitral regurgitation and heart failure.  The patient was last seen in the outpatient setting by Dr. Dulce Sellar in May of this year.  At that time her cardiovascular issues included paroxysmal atrial fibrillation, anemia, and hypotension.  There were some adjustments made in her medications.  She was then admitted September 30 through October 13 with acute on chronic diastolic heart failure, paroxysmal atrial fibrillation, and severe mitral regurgitation.  She required BiPAP, IV amiodarone, and aggressive diuresis with IV furosemide.  She was found to have severe valvular heart disease with severe mitral and aortic regurgitation as well as moderate tricuspid regurgitation.  She was seen by cardiac surgery and was deemed a nonoperative candidate due to her severe comorbidities.  Structural heart team evaluated her for consideration of transcatheter edge-to-edge repair of the mitral valve.  She underwent right and left heart catheterization showing angiographically normal coronary arteries and cardiac hemodynamics that confirmed severe mitral regurgitation with giant V waves in the wedge tracing.  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  She returns today to discuss treatment options and consideration of MitraClip.  The patient is here with her daughter.  She is feeling a little better than when she was hospitalized last week.  Her breathing has improved.  She is experiencing nausea and decreased appetite since hospital discharge.  She has had no chest  pain or pressure.  She denies edema, orthopnea, or PND.  She is still short of breath with activity and she also complains of generalized weakness.   Current Medications: Current Meds  Medication Sig   acetaminophen (TYLENOL) 500 MG tablet Take 500 mg by mouth every 6 (six) hours as needed for moderate pain or headache.   amiodarone (PACERONE) 200 MG tablet Take 1 tablet (200 mg total) by mouth daily.   apixaban (ELIQUIS) 5 MG TABS tablet Take 1 tablet (5 mg total) by mouth 2 (two) times daily.   docusate sodium (COLACE) 100 MG capsule Take 100 mg by mouth daily.   empagliflozin (JARDIANCE) 10 MG TABS tablet Take 1 tablet (10 mg total) by mouth daily.   Ferric Maltol 30 MG CAPS Take 1 capsule (30 mg total) by mouth daily.   fluticasone (FLONASE) 50 MCG/ACT nasal spray Place 2 sprays into both nostrils daily.   furosemide (LASIX) 40 MG tablet Take 1 tablet (40 mg total) by mouth daily.   levalbuterol (XOPENEX HFA) 45 MCG/ACT inhaler Inhale 1-2 puffs into the lungs every 4 (four) hours as needed for wheezing. (Patient taking differently: Inhale 1-2 puffs into the lungs 2 (two) times daily.)   metoprolol tartrate (LOPRESSOR) 25 MG tablet Take 0.5 tablets (12.5 mg total) by mouth 2 (two) times daily.   midodrine (PROAMATINE) 10 MG tablet Take 1 tablet (10 mg total) by mouth 2 (two) times daily with a meal.   montelukast (SINGULAIR) 10 MG tablet Take 1 tablet (10 mg total) by mouth every evening.   pantoprazole (PROTONIX) 40 MG tablet Take 1 tablet (40 mg total)  [id number]                    Weight:       130.1 lb Date of Birth:  1946/03/07                    BSA:          1.611 m Patient Age:    76 years                      BP:           102/33 mmHg Patient Gender: F                             HR:           72 bpm. Exam Location:  Inpatient  Procedure: 2D Echo, Cardiac Doppler and Color Doppler  Indications:    CHF  History:        Patient has no prior history of Echocardiogram examinations. CHF, COPD, Arrythmias:Atrial Fibrillation; Risk Factors:Hypertension and Dyslipidemia.  Sonographer:    Melton Krebs RDCS, FE, PE Referring Phys: [id number] JAN A MANSY  IMPRESSIONS   1. Left ventricular ejection fraction, by estimation, is 65 to 70%. The left ventricle  has normal function. The left ventricle has no regional wall motion abnormalities. There is moderate asymmetric left ventricular hypertrophy of the basal-septal segment. Left ventricular diastolic parameters are consistent with Grade III diastolic dysfunction (restrictive). 2. Right ventricular systolic function is normal. The right ventricular size is normal. There is moderately elevated pulmonary artery systolic pressure. The estimated right ventricular systolic pressure is 59.3 mmHg. 3. Left atrial size was severely dilated. 4. Right atrial size was moderately dilated. 5. The mitral valve is thickened with mildly restricted leaflet motion. The left atrium is severely dialted with dialtion of the MV annulus. There is severe central MR. The mitral valve is abnormal. Severe mitral valve regurgitation. No evidence of mitral stenosis. 6. Tricuspid valve regurgitation is moderate. 7. The aortic valve is tricuspid. Aortic valve regurgitation is mild to moderate. No aortic stenosis is present. Aortic valve area, by VTI measures 2.45 cm. Aortic valve mean gradient measures 9.0 mmHg. Aortic valve Vmax measures 2.03 m/s. 8. The inferior vena cava is dilated in size with <50% respiratory variability, suggesting right atrial pressure of 15 mmHg.  Conclusion(s)/Recommendation(s): Consider TEE to further evalaute valvular disease if clinically indicated.  FINDINGS Left Ventricle: Left ventricular ejection fraction, by estimation, is 65 to 70%. The left ventricle has normal function. The left ventricle has no regional wall motion abnormalities. The left ventricular internal cavity size was normal in size. There is moderate asymmetric left ventricular hypertrophy of the basal-septal segment. Left ventricular diastolic parameters are consistent with Grade III diastolic dysfunction (restrictive).  Right Ventricle: The right ventricular size is normal. No increase in right ventricular wall thickness. Right  ventricular systolic function is normal. There is moderately elevated pulmonary artery systolic pressure. The tricuspid regurgitant velocity is 3.51 m/s, and with an assumed right atrial pressure of 10 mmHg, the estimated right ventricular systolic pressure is 59.3 mmHg.  Left Atrium: Left atrial size was severely dilated.  Right Atrium: Right atrial size was moderately dilated.  Pericardium: There is no evidence of pericardial effusion.  Mitral Valve: The mitral valve is thickened with mildly restricted leaflet motion. The left atrium is severely dialted with dialtion of the MV annulus. There is

## 2023-01-10 NOTE — Patient Instructions (Signed)
Medication Instructions:  Your physician recommends that you continue on your current medications as directed. Please refer to the Current Medication list given to you today.  *If you need a refill on your cardiac medications before your next appointment, please call your pharmacy*  Follow-Up: At [facility], you and your health needs are our priority.  As part of our continuing mission to provide you with exceptional heart care, we have created designated Provider Care Teams.  These Care Teams include your primary Cardiologist (physician) and Advanced Practice Providers (APPs -  Physician Assistants and Nurse Practitioners) who all work together to provide you with the care you need, when you need it.  Your next appointment:   Structural Team will follow-up  Provider:   Tonny Bollman, MD

## 2023-01-10 NOTE — H&P (View-Only) (Signed)
[id number]                    Weight:       130.1 lb Date of Birth:  1946/03/07                    BSA:          1.611 m Patient Age:    77 years                      BP:           102/33 mmHg Patient Gender: F                             HR:           72 bpm. Exam Location:  Inpatient  Procedure: 2D Echo, Cardiac Doppler and Color Doppler  Indications:    CHF  History:        Patient has no prior history of Echocardiogram examinations. CHF, COPD, Arrythmias:Atrial Fibrillation; Risk Factors:Hypertension and Dyslipidemia.  Sonographer:    Melton Krebs RDCS, FE, PE Referring Phys: [id number] JAN A MANSY  IMPRESSIONS   1. Left ventricular ejection fraction, by estimation, is 65 to 70%. The left ventricle  has normal function. The left ventricle has no regional wall motion abnormalities. There is moderate asymmetric left ventricular hypertrophy of the basal-septal segment. Left ventricular diastolic parameters are consistent with Grade III diastolic dysfunction (restrictive). 2. Right ventricular systolic function is normal. The right ventricular size is normal. There is moderately elevated pulmonary artery systolic pressure. The estimated right ventricular systolic pressure is 59.3 mmHg. 3. Left atrial size was severely dilated. 4. Right atrial size was moderately dilated. 5. The mitral valve is thickened with mildly restricted leaflet motion. The left atrium is severely dialted with dialtion of the MV annulus. There is severe central MR. The mitral valve is abnormal. Severe mitral valve regurgitation. No evidence of mitral stenosis. 6. Tricuspid valve regurgitation is moderate. 7. The aortic valve is tricuspid. Aortic valve regurgitation is mild to moderate. No aortic stenosis is present. Aortic valve area, by VTI measures 2.45 cm. Aortic valve mean gradient measures 9.0 mmHg. Aortic valve Vmax measures 2.03 m/s. 8. The inferior vena cava is dilated in size with <50% respiratory variability, suggesting right atrial pressure of 15 mmHg.  Conclusion(s)/Recommendation(s): Consider TEE to further evalaute valvular disease if clinically indicated.  FINDINGS Left Ventricle: Left ventricular ejection fraction, by estimation, is 65 to 70%. The left ventricle has normal function. The left ventricle has no regional wall motion abnormalities. The left ventricular internal cavity size was normal in size. There is moderate asymmetric left ventricular hypertrophy of the basal-septal segment. Left ventricular diastolic parameters are consistent with Grade III diastolic dysfunction (restrictive).  Right Ventricle: The right ventricular size is normal. No increase in right ventricular wall thickness. Right  ventricular systolic function is normal. There is moderately elevated pulmonary artery systolic pressure. The tricuspid regurgitant velocity is 3.51 m/s, and with an assumed right atrial pressure of 10 mmHg, the estimated right ventricular systolic pressure is 59.3 mmHg.  Left Atrium: Left atrial size was severely dilated.  Right Atrium: Right atrial size was moderately dilated.  Pericardium: There is no evidence of pericardial effusion.  Mitral Valve: The mitral valve is thickened with mildly restricted leaflet motion. The left atrium is severely dialted with dialtion of the MV annulus. There is  351.00 cm/s MV E velocity: 172.00 cm/s MV A velocity: 49.90 cm/s   SHUNTS MV E/A ratio:  3.45         Systemic VTI:  0.37 m Systemic Diam: 1.90 cm  Dana Meres MD Electronically signed by Dana Meres MD Signature Date/Time: 12/25/2022/10:16:24 AM    Final   TEE  ECHO TEE 01/01/2023  Narrative TRANSESOPHOGEAL ECHO REPORT    Patient Name:   Dana Horton Date of Exam: 01/01/2023 Medical Rec #:  [id number]                     Height:       63.0 in Accession #:    [id number]                    Weight:       135.6 lb Date of Birth:  [birth year]                    BSA:          1.639 m Patient Age:    77 years                      BP:           116/52 mmHg Patient Gender: F                             HR:           88 bpm. Exam Location:  Inpatient  Procedure: Transesophageal Echo, 3D Echo, Color Doppler and Cardiac Doppler  Indications:     Mitral Regurgitation  i34.0  History:         Patient has prior history of Echocardiogram examinations, most recent 12/25/2022. COPD, Arrythmias:Atrial Fibrillation; Risk Factors:Hypertension, Sleep Apnea and Dyslipidemia.  Sonographer:     Irving Burton Senior RDCS Referring Phys:  [id number] BRIDGETTE CHRISTOPHER Diagnosing Phys: Jodelle Red MD  PROCEDURE: After discussion of the risks and benefits of a TEE, an informed consent was obtained from the patient. The transesophogeal probe was passed without difficulty through the esophogus of the patient. Sedation performed by different physician. The patient was monitored while under deep sedation. Anesthestetic sedation was provided intravenously by Anesthesiology: 97mg  of Propofol, 60mg  of Lidocaine. Image quality was good. The patient developed no complications during the procedure.  IMPRESSIONS   1. Left ventricular ejection fraction, by estimation, is 65 to 70%. The left ventricle has normal function. 2. Right ventricular systolic function is normal. The right ventricular size is normal. There is severely elevated pulmonary artery systolic pressure. The estimated right ventricular systolic pressure is 60.7 mmHg. 3. Left atrial size was severely dilated. No left atrial/left atrial appendage thrombus was detected. 4. Right atrial size was moderately dilated. 5. A small pericardial effusion is present. The pericardial effusion is circumferential. 6. Flail P3 leaflet with possible cleft between P2-P3. The mitral valve is abnormal. Severe mitral valve regurgitation. 7. Tricuspid valve regurgitation is moderate. 8. The aortic valve is tricuspid. Aortic valve regurgitation is severe. No aortic stenosis is present. 9. There is Moderate (Grade III) plaque involving the descending aorta. 10. Severely dilated pulmonary artery.  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 Severe aortic regurgitation. Severely dilated  PA with severely elevated PA pressure.  FINDINGS Left Ventricle: Left ventricular ejection fraction, by estimation, is 65 to 70%. The left ventricle has normal  [id number]                    Weight:       130.1 lb Date of Birth:  1946/03/07                    BSA:          1.611 m Patient Age:    77 years                      BP:           102/33 mmHg Patient Gender: F                             HR:           72 bpm. Exam Location:  Inpatient  Procedure: 2D Echo, Cardiac Doppler and Color Doppler  Indications:    CHF  History:        Patient has no prior history of Echocardiogram examinations. CHF, COPD, Arrythmias:Atrial Fibrillation; Risk Factors:Hypertension and Dyslipidemia.  Sonographer:    Melton Krebs RDCS, FE, PE Referring Phys: [id number] JAN A MANSY  IMPRESSIONS   1. Left ventricular ejection fraction, by estimation, is 65 to 70%. The left ventricle  has normal function. The left ventricle has no regional wall motion abnormalities. There is moderate asymmetric left ventricular hypertrophy of the basal-septal segment. Left ventricular diastolic parameters are consistent with Grade III diastolic dysfunction (restrictive). 2. Right ventricular systolic function is normal. The right ventricular size is normal. There is moderately elevated pulmonary artery systolic pressure. The estimated right ventricular systolic pressure is 59.3 mmHg. 3. Left atrial size was severely dilated. 4. Right atrial size was moderately dilated. 5. The mitral valve is thickened with mildly restricted leaflet motion. The left atrium is severely dialted with dialtion of the MV annulus. There is severe central MR. The mitral valve is abnormal. Severe mitral valve regurgitation. No evidence of mitral stenosis. 6. Tricuspid valve regurgitation is moderate. 7. The aortic valve is tricuspid. Aortic valve regurgitation is mild to moderate. No aortic stenosis is present. Aortic valve area, by VTI measures 2.45 cm. Aortic valve mean gradient measures 9.0 mmHg. Aortic valve Vmax measures 2.03 m/s. 8. The inferior vena cava is dilated in size with <50% respiratory variability, suggesting right atrial pressure of 15 mmHg.  Conclusion(s)/Recommendation(s): Consider TEE to further evalaute valvular disease if clinically indicated.  FINDINGS Left Ventricle: Left ventricular ejection fraction, by estimation, is 65 to 70%. The left ventricle has normal function. The left ventricle has no regional wall motion abnormalities. The left ventricular internal cavity size was normal in size. There is moderate asymmetric left ventricular hypertrophy of the basal-septal segment. Left ventricular diastolic parameters are consistent with Grade III diastolic dysfunction (restrictive).  Right Ventricle: The right ventricular size is normal. No increase in right ventricular wall thickness. Right  ventricular systolic function is normal. There is moderately elevated pulmonary artery systolic pressure. The tricuspid regurgitant velocity is 3.51 m/s, and with an assumed right atrial pressure of 10 mmHg, the estimated right ventricular systolic pressure is 59.3 mmHg.  Left Atrium: Left atrial size was severely dilated.  Right Atrium: Right atrial size was moderately dilated.  Pericardium: There is no evidence of pericardial effusion.  Mitral Valve: The mitral valve is thickened with mildly restricted leaflet motion. The left atrium is severely dialted with dialtion of the MV annulus. There is  [id number]                    Weight:       130.1 lb Date of Birth:  1946/03/07                    BSA:          1.611 m Patient Age:    77 years                      BP:           102/33 mmHg Patient Gender: F                             HR:           72 bpm. Exam Location:  Inpatient  Procedure: 2D Echo, Cardiac Doppler and Color Doppler  Indications:    CHF  History:        Patient has no prior history of Echocardiogram examinations. CHF, COPD, Arrythmias:Atrial Fibrillation; Risk Factors:Hypertension and Dyslipidemia.  Sonographer:    Melton Krebs RDCS, FE, PE Referring Phys: [id number] JAN A MANSY  IMPRESSIONS   1. Left ventricular ejection fraction, by estimation, is 65 to 70%. The left ventricle  has normal function. The left ventricle has no regional wall motion abnormalities. There is moderate asymmetric left ventricular hypertrophy of the basal-septal segment. Left ventricular diastolic parameters are consistent with Grade III diastolic dysfunction (restrictive). 2. Right ventricular systolic function is normal. The right ventricular size is normal. There is moderately elevated pulmonary artery systolic pressure. The estimated right ventricular systolic pressure is 59.3 mmHg. 3. Left atrial size was severely dilated. 4. Right atrial size was moderately dilated. 5. The mitral valve is thickened with mildly restricted leaflet motion. The left atrium is severely dialted with dialtion of the MV annulus. There is severe central MR. The mitral valve is abnormal. Severe mitral valve regurgitation. No evidence of mitral stenosis. 6. Tricuspid valve regurgitation is moderate. 7. The aortic valve is tricuspid. Aortic valve regurgitation is mild to moderate. No aortic stenosis is present. Aortic valve area, by VTI measures 2.45 cm. Aortic valve mean gradient measures 9.0 mmHg. Aortic valve Vmax measures 2.03 m/s. 8. The inferior vena cava is dilated in size with <50% respiratory variability, suggesting right atrial pressure of 15 mmHg.  Conclusion(s)/Recommendation(s): Consider TEE to further evalaute valvular disease if clinically indicated.  FINDINGS Left Ventricle: Left ventricular ejection fraction, by estimation, is 65 to 70%. The left ventricle has normal function. The left ventricle has no regional wall motion abnormalities. The left ventricular internal cavity size was normal in size. There is moderate asymmetric left ventricular hypertrophy of the basal-septal segment. Left ventricular diastolic parameters are consistent with Grade III diastolic dysfunction (restrictive).  Right Ventricle: The right ventricular size is normal. No increase in right ventricular wall thickness. Right  ventricular systolic function is normal. There is moderately elevated pulmonary artery systolic pressure. The tricuspid regurgitant velocity is 3.51 m/s, and with an assumed right atrial pressure of 10 mmHg, the estimated right ventricular systolic pressure is 59.3 mmHg.  Left Atrium: Left atrial size was severely dilated.  Right Atrium: Right atrial size was moderately dilated.  Pericardium: There is no evidence of pericardial effusion.  Mitral Valve: The mitral valve is thickened with mildly restricted leaflet motion. The left atrium is severely dialted with dialtion of the MV annulus. There is  Cardiology Office Note:    Date:  01/10/2023   ID:  Dana Horton, DOB [birth year], MRN [id number]  PCP:  Crist Fat, MD   [facility] Providers Cardiologist:  Norman Herrlich, MD     Referring MD: Leonia Reader, Barbara Cower, MD   Chief Complaint  Patient presents with   Mitral Regurgitation    History of Present Illness:    Dana Horton is a 77 y.o. female presenting for hospital follow-up of severe mitral regurgitation and heart failure.  The patient was last seen in the outpatient setting by Dr. Dulce Sellar in May of this year.  At that time her cardiovascular issues included paroxysmal atrial fibrillation, anemia, and hypotension.  There were some adjustments made in her medications.  She was then admitted September 30 through October 13 with acute on chronic diastolic heart failure, paroxysmal atrial fibrillation, and severe mitral regurgitation.  She required BiPAP, IV amiodarone, and aggressive diuresis with IV furosemide.  She was found to have severe valvular heart disease with severe mitral and aortic regurgitation as well as moderate tricuspid regurgitation.  She was seen by cardiac surgery and was deemed a nonoperative candidate due to her severe comorbidities.  Structural heart team evaluated her for consideration of transcatheter edge-to-edge repair of the mitral valve.  She underwent right and left heart catheterization showing angiographically normal coronary arteries and cardiac hemodynamics that confirmed severe mitral regurgitation with giant V waves in the wedge tracing.  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  She returns today to discuss treatment options and consideration of MitraClip.  The patient is here with her daughter.  She is feeling a little better than when she was hospitalized last week.  Her breathing has improved.  She is experiencing nausea and decreased appetite since hospital discharge.  She has had no chest  pain or pressure.  She denies edema, orthopnea, or PND.  She is still short of breath with activity and she also complains of generalized weakness.   Current Medications: Current Meds  Medication Sig   acetaminophen (TYLENOL) 500 MG tablet Take 500 mg by mouth every 6 (six) hours as needed for moderate pain or headache.   amiodarone (PACERONE) 200 MG tablet Take 1 tablet (200 mg total) by mouth daily.   apixaban (ELIQUIS) 5 MG TABS tablet Take 1 tablet (5 mg total) by mouth 2 (two) times daily.   docusate sodium (COLACE) 100 MG capsule Take 100 mg by mouth daily.   empagliflozin (JARDIANCE) 10 MG TABS tablet Take 1 tablet (10 mg total) by mouth daily.   Ferric Maltol 30 MG CAPS Take 1 capsule (30 mg total) by mouth daily.   fluticasone (FLONASE) 50 MCG/ACT nasal spray Place 2 sprays into both nostrils daily.   furosemide (LASIX) 40 MG tablet Take 1 tablet (40 mg total) by mouth daily.   levalbuterol (XOPENEX HFA) 45 MCG/ACT inhaler Inhale 1-2 puffs into the lungs every 4 (four) hours as needed for wheezing. (Patient taking differently: Inhale 1-2 puffs into the lungs 2 (two) times daily.)   metoprolol tartrate (LOPRESSOR) 25 MG tablet Take 0.5 tablets (12.5 mg total) by mouth 2 (two) times daily.   midodrine (PROAMATINE) 10 MG tablet Take 1 tablet (10 mg total) by mouth 2 (two) times daily with a meal.   montelukast (SINGULAIR) 10 MG tablet Take 1 tablet (10 mg total) by mouth every evening.   pantoprazole (PROTONIX) 40 MG tablet Take 1 tablet (40 mg total)  Cardiology Office Note:    Date:  01/10/2023   ID:  Dana Horton, DOB [birth year], MRN [id number]  PCP:  Crist Fat, MD   [facility] Providers Cardiologist:  Norman Herrlich, MD     Referring MD: Leonia Reader, Barbara Cower, MD   Chief Complaint  Patient presents with   Mitral Regurgitation    History of Present Illness:    Dana Horton is a 77 y.o. female presenting for hospital follow-up of severe mitral regurgitation and heart failure.  The patient was last seen in the outpatient setting by Dr. Dulce Sellar in May of this year.  At that time her cardiovascular issues included paroxysmal atrial fibrillation, anemia, and hypotension.  There were some adjustments made in her medications.  She was then admitted September 30 through October 13 with acute on chronic diastolic heart failure, paroxysmal atrial fibrillation, and severe mitral regurgitation.  She required BiPAP, IV amiodarone, and aggressive diuresis with IV furosemide.  She was found to have severe valvular heart disease with severe mitral and aortic regurgitation as well as moderate tricuspid regurgitation.  She was seen by cardiac surgery and was deemed a nonoperative candidate due to her severe comorbidities.  Structural heart team evaluated her for consideration of transcatheter edge-to-edge repair of the mitral valve.  She underwent right and left heart catheterization showing angiographically normal coronary arteries and cardiac hemodynamics that confirmed severe mitral regurgitation with giant V waves in the wedge tracing.  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  She returns today to discuss treatment options and consideration of MitraClip.  The patient is here with her daughter.  She is feeling a little better than when she was hospitalized last week.  Her breathing has improved.  She is experiencing nausea and decreased appetite since hospital discharge.  She has had no chest  pain or pressure.  She denies edema, orthopnea, or PND.  She is still short of breath with activity and she also complains of generalized weakness.   Current Medications: Current Meds  Medication Sig   acetaminophen (TYLENOL) 500 MG tablet Take 500 mg by mouth every 6 (six) hours as needed for moderate pain or headache.   amiodarone (PACERONE) 200 MG tablet Take 1 tablet (200 mg total) by mouth daily.   apixaban (ELIQUIS) 5 MG TABS tablet Take 1 tablet (5 mg total) by mouth 2 (two) times daily.   docusate sodium (COLACE) 100 MG capsule Take 100 mg by mouth daily.   empagliflozin (JARDIANCE) 10 MG TABS tablet Take 1 tablet (10 mg total) by mouth daily.   Ferric Maltol 30 MG CAPS Take 1 capsule (30 mg total) by mouth daily.   fluticasone (FLONASE) 50 MCG/ACT nasal spray Place 2 sprays into both nostrils daily.   furosemide (LASIX) 40 MG tablet Take 1 tablet (40 mg total) by mouth daily.   levalbuterol (XOPENEX HFA) 45 MCG/ACT inhaler Inhale 1-2 puffs into the lungs every 4 (four) hours as needed for wheezing. (Patient taking differently: Inhale 1-2 puffs into the lungs 2 (two) times daily.)   metoprolol tartrate (LOPRESSOR) 25 MG tablet Take 0.5 tablets (12.5 mg total) by mouth 2 (two) times daily.   midodrine (PROAMATINE) 10 MG tablet Take 1 tablet (10 mg total) by mouth 2 (two) times daily with a meal.   montelukast (SINGULAIR) 10 MG tablet Take 1 tablet (10 mg total) by mouth every evening.   pantoprazole (PROTONIX) 40 MG tablet Take 1 tablet (40 mg total)  Cardiology Office Note:    Date:  01/10/2023   ID:  Dana Horton, DOB [birth year], MRN [id number]  PCP:  Crist Fat, MD   [facility] Providers Cardiologist:  Norman Herrlich, MD     Referring MD: Leonia Reader, Barbara Cower, MD   Chief Complaint  Patient presents with   Mitral Regurgitation    History of Present Illness:    Dana Horton is a 77 y.o. female presenting for hospital follow-up of severe mitral regurgitation and heart failure.  The patient was last seen in the outpatient setting by Dr. Dulce Sellar in May of this year.  At that time her cardiovascular issues included paroxysmal atrial fibrillation, anemia, and hypotension.  There were some adjustments made in her medications.  She was then admitted September 30 through October 13 with acute on chronic diastolic heart failure, paroxysmal atrial fibrillation, and severe mitral regurgitation.  She required BiPAP, IV amiodarone, and aggressive diuresis with IV furosemide.  She was found to have severe valvular heart disease with severe mitral and aortic regurgitation as well as moderate tricuspid regurgitation.  She was seen by cardiac surgery and was deemed a nonoperative candidate due to her severe comorbidities.  Structural heart team evaluated her for consideration of transcatheter edge-to-edge repair of the mitral valve.  She underwent right and left heart catheterization showing angiographically normal coronary arteries and cardiac hemodynamics that confirmed severe mitral regurgitation with giant V waves in the wedge tracing.  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  She returns today to discuss treatment options and consideration of MitraClip.  The patient is here with her daughter.  She is feeling a little better than when she was hospitalized last week.  Her breathing has improved.  She is experiencing nausea and decreased appetite since hospital discharge.  She has had no chest  pain or pressure.  She denies edema, orthopnea, or PND.  She is still short of breath with activity and she also complains of generalized weakness.   Current Medications: Current Meds  Medication Sig   acetaminophen (TYLENOL) 500 MG tablet Take 500 mg by mouth every 6 (six) hours as needed for moderate pain or headache.   amiodarone (PACERONE) 200 MG tablet Take 1 tablet (200 mg total) by mouth daily.   apixaban (ELIQUIS) 5 MG TABS tablet Take 1 tablet (5 mg total) by mouth 2 (two) times daily.   docusate sodium (COLACE) 100 MG capsule Take 100 mg by mouth daily.   empagliflozin (JARDIANCE) 10 MG TABS tablet Take 1 tablet (10 mg total) by mouth daily.   Ferric Maltol 30 MG CAPS Take 1 capsule (30 mg total) by mouth daily.   fluticasone (FLONASE) 50 MCG/ACT nasal spray Place 2 sprays into both nostrils daily.   furosemide (LASIX) 40 MG tablet Take 1 tablet (40 mg total) by mouth daily.   levalbuterol (XOPENEX HFA) 45 MCG/ACT inhaler Inhale 1-2 puffs into the lungs every 4 (four) hours as needed for wheezing. (Patient taking differently: Inhale 1-2 puffs into the lungs 2 (two) times daily.)   metoprolol tartrate (LOPRESSOR) 25 MG tablet Take 0.5 tablets (12.5 mg total) by mouth 2 (two) times daily.   midodrine (PROAMATINE) 10 MG tablet Take 1 tablet (10 mg total) by mouth 2 (two) times daily with a meal.   montelukast (SINGULAIR) 10 MG tablet Take 1 tablet (10 mg total) by mouth every evening.   pantoprazole (PROTONIX) 40 MG tablet Take 1 tablet (40 mg total)  [id number]                    Weight:       130.1 lb Date of Birth:  1946/03/07                    BSA:          1.611 m Patient Age:    77 years                      BP:           102/33 mmHg Patient Gender: F                             HR:           72 bpm. Exam Location:  Inpatient  Procedure: 2D Echo, Cardiac Doppler and Color Doppler  Indications:    CHF  History:        Patient has no prior history of Echocardiogram examinations. CHF, COPD, Arrythmias:Atrial Fibrillation; Risk Factors:Hypertension and Dyslipidemia.  Sonographer:    Melton Krebs RDCS, FE, PE Referring Phys: [id number] JAN A MANSY  IMPRESSIONS   1. Left ventricular ejection fraction, by estimation, is 65 to 70%. The left ventricle  has normal function. The left ventricle has no regional wall motion abnormalities. There is moderate asymmetric left ventricular hypertrophy of the basal-septal segment. Left ventricular diastolic parameters are consistent with Grade III diastolic dysfunction (restrictive). 2. Right ventricular systolic function is normal. The right ventricular size is normal. There is moderately elevated pulmonary artery systolic pressure. The estimated right ventricular systolic pressure is 59.3 mmHg. 3. Left atrial size was severely dilated. 4. Right atrial size was moderately dilated. 5. The mitral valve is thickened with mildly restricted leaflet motion. The left atrium is severely dialted with dialtion of the MV annulus. There is severe central MR. The mitral valve is abnormal. Severe mitral valve regurgitation. No evidence of mitral stenosis. 6. Tricuspid valve regurgitation is moderate. 7. The aortic valve is tricuspid. Aortic valve regurgitation is mild to moderate. No aortic stenosis is present. Aortic valve area, by VTI measures 2.45 cm. Aortic valve mean gradient measures 9.0 mmHg. Aortic valve Vmax measures 2.03 m/s. 8. The inferior vena cava is dilated in size with <50% respiratory variability, suggesting right atrial pressure of 15 mmHg.  Conclusion(s)/Recommendation(s): Consider TEE to further evalaute valvular disease if clinically indicated.  FINDINGS Left Ventricle: Left ventricular ejection fraction, by estimation, is 65 to 70%. The left ventricle has normal function. The left ventricle has no regional wall motion abnormalities. The left ventricular internal cavity size was normal in size. There is moderate asymmetric left ventricular hypertrophy of the basal-septal segment. Left ventricular diastolic parameters are consistent with Grade III diastolic dysfunction (restrictive).  Right Ventricle: The right ventricular size is normal. No increase in right ventricular wall thickness. Right  ventricular systolic function is normal. There is moderately elevated pulmonary artery systolic pressure. The tricuspid regurgitant velocity is 3.51 m/s, and with an assumed right atrial pressure of 10 mmHg, the estimated right ventricular systolic pressure is 59.3 mmHg.  Left Atrium: Left atrial size was severely dilated.  Right Atrium: Right atrial size was moderately dilated.  Pericardium: There is no evidence of pericardial effusion.  Mitral Valve: The mitral valve is thickened with mildly restricted leaflet motion. The left atrium is severely dialted with dialtion of the MV annulus. There is

## 2023-01-11 ENCOUNTER — Other Ambulatory Visit: Payer: Self-pay

## 2023-01-11 DIAGNOSIS — R110 Nausea: Secondary | ICD-10-CM

## 2023-01-11 MED ORDER — ONDANSETRON HCL 4 MG PO TABS: 4.0000 mg | ORAL_TABLET | Freq: Every day | ORAL | 1 refills | Status: DC | PRN

## 2023-01-11 NOTE — Progress Notes (Signed)
New Rx

## 2023-01-12 ENCOUNTER — Other Ambulatory Visit: Payer: Self-pay | Admitting: Internal Medicine

## 2023-01-15 ENCOUNTER — Other Ambulatory Visit: Payer: Self-pay

## 2023-01-15 ENCOUNTER — Encounter (HOSPITAL_COMMUNITY): Payer: Self-pay | Admitting: Cardiovascular Disease

## 2023-01-15 NOTE — Progress Notes (Signed)
PCP - Crist Fat, MD Cardiologist - Dr. Norman Herrlich Urologist- Dr. Berneice Heinrich   PPM/ICD - denies   Chest x-ray - needs DOS EKG - 12/24/22 Stress Test - denies ECHO - 01/01/23 Cardiac Cath - 04/09/17  CPAP - nightly, pressure settings 5  DM- denies  Blood Thinner Instructions: Eliquis last dose 10/20 Aspirin Instructions: n/a  ERAS Protcol - no, NPO  COVID TEST- needs DOS  Anesthesia review: yes, Anesthesia PA notified  Patient verbally denies any shortness of breath, fever, cough and chest pain during phone call   -------------  SDW INSTRUCTIONS given:  Your procedure is scheduled on 10/23.  Report to [facility] Main Entrance "A" at 0530 A.M., and check in at the Admitting office.  Call this number if you have problems the morning of surgery:  [phone]   Remember:  Do not eat or drinkafter midnight the night before your surgery      Take these medicines the morning of surgery with A SIP OF WATER  Amiodarone  Stop Jardiance 10/19 Last dose Eliquis 10/20  As of today, STOP taking any Aspirin (unless otherwise instructed by your surgeon) Aleve, Naproxen, Ibuprofen, Motrin, Advil, Goody's, BC's, all herbal medications, fish oil, and all vitamins.                      Do not wear jewelry, make up, or nail polish            Do not wear lotions, powders, perfumes/colognes, or deodorant.            Do not shave 48 hours prior to surgery.  Men may shave face and neck.            Do not bring valuables to the hospital.            [facility] is not responsible for any belongings or valuables.  Do NOT Smoke (Tobacco/Vaping) 24 hours prior to your procedure If you use a CPAP at night, you may bring all equipment for your overnight stay.   Contacts, glasses, dentures or bridgework may not be worn into surgery.      For patients admitted to the hospital, discharge time will be determined by your treatment team.   Patients discharged the day of surgery will not be  allowed to drive home, and someone needs to stay with them for 24 hours.    Special instructions:   [place]- Preparing For Surgery  Before surgery, you can play an important role. Because skin is not sterile, your skin needs to be as free of germs as possible. You can reduce the number of germs on your skin by washing with CHG (chlorahexidine gluconate) Soap before surgery.  CHG is an antiseptic cleaner which kills germs and bonds with the skin to continue killing germs even after washing.    Oral Hygiene is also important to reduce your risk of infection.  Remember - BRUSH YOUR TEETH THE MORNING OF SURGERY WITH YOUR REGULAR TOOTHPASTE  Please do not use if you have an allergy to CHG or antibacterial soaps. If your skin becomes reddened/irritated stop using the CHG.  Do not shave (including legs and underarms) for at least 48 hours prior to first CHG shower. It is OK to shave your face.  Please follow these instructions carefully.   Shower the NIGHT BEFORE SURGERY and the MORNING OF SURGERY with DIAL Soap.   Pat yourself dry with a CLEAN TOWEL.  Wear CLEAN PAJAMAS  to bed the night before surgery  Place CLEAN SHEETS on your bed the night of your first shower and DO NOT SLEEP WITH PETS.   Day of Surgery: Please shower morning of surgery  Wear Clean/Comfortable clothing the morning of surgery Do not apply any deodorants/lotions.   Remember to brush your teeth WITH YOUR REGULAR TOOTHPASTE.   Questions were answered. Patient verbalized understanding of instructions.

## 2023-01-16 ENCOUNTER — Other Ambulatory Visit: Payer: Self-pay

## 2023-01-16 ENCOUNTER — Encounter (HOSPITAL_COMMUNITY)
Admission: RE | Disposition: A | Discharge status: Home / Self Care | Payer: Self-pay | Source: Home / Self Care | Attending: Cardiovascular Disease

## 2023-01-16 ENCOUNTER — Inpatient Hospital Stay (HOSPITAL_COMMUNITY): Payer: Medicare Other | Admitting: Certified Registered Nurse Anesthetist

## 2023-01-16 ENCOUNTER — Inpatient Hospital Stay (HOSPITAL_COMMUNITY)
Admission: RE | Admit: 2023-01-16 | Discharge: 2023-01-17 | DRG: 266 | Disposition: A | Discharge status: Home / Self Care | Payer: Medicare Other | Attending: Cardiovascular Disease | Admitting: Cardiovascular Disease

## 2023-01-16 ENCOUNTER — Encounter (HOSPITAL_COMMUNITY): Payer: Self-pay | Admitting: Cardiovascular Disease

## 2023-01-16 ENCOUNTER — Inpatient Hospital Stay (HOSPITAL_COMMUNITY): Payer: Medicare Other

## 2023-01-16 DIAGNOSIS — D494 Neoplasm of unspecified behavior of bladder: Secondary | ICD-10-CM | POA: Diagnosis present

## 2023-01-16 DIAGNOSIS — N1831 Chronic kidney disease, stage 3a: Secondary | ICD-10-CM | POA: Diagnosis present

## 2023-01-16 DIAGNOSIS — R54 Age-related physical debility: Secondary | ICD-10-CM | POA: Diagnosis present

## 2023-01-16 DIAGNOSIS — Z1152 Encounter for screening for COVID-19: Secondary | ICD-10-CM | POA: Diagnosis not present

## 2023-01-16 DIAGNOSIS — I2510 Atherosclerotic heart disease of native coronary artery without angina pectoris: Secondary | ICD-10-CM | POA: Diagnosis present

## 2023-01-16 DIAGNOSIS — I5032 Chronic diastolic (congestive) heart failure: Secondary | ICD-10-CM | POA: Diagnosis present

## 2023-01-16 DIAGNOSIS — J449 Chronic obstructive pulmonary disease, unspecified: Secondary | ICD-10-CM | POA: Diagnosis present

## 2023-01-16 DIAGNOSIS — Z01818 Encounter for other preprocedural examination: Secondary | ICD-10-CM | POA: Diagnosis not present

## 2023-01-16 DIAGNOSIS — I10 Essential (primary) hypertension: Secondary | ICD-10-CM | POA: Diagnosis present

## 2023-01-16 DIAGNOSIS — D500 Iron deficiency anemia secondary to blood loss (chronic): Secondary | ICD-10-CM | POA: Diagnosis present

## 2023-01-16 DIAGNOSIS — Z720 Tobacco use: Secondary | ICD-10-CM | POA: Diagnosis present

## 2023-01-16 DIAGNOSIS — I4819 Other persistent atrial fibrillation: Secondary | ICD-10-CM | POA: Diagnosis present

## 2023-01-16 DIAGNOSIS — Z7984 Long term (current) use of oral hypoglycemic drugs: Secondary | ICD-10-CM

## 2023-01-16 DIAGNOSIS — Z7951 Long term (current) use of inhaled steroids: Secondary | ICD-10-CM

## 2023-01-16 DIAGNOSIS — Z006 Encounter for examination for normal comparison and control in clinical research program: Secondary | ICD-10-CM

## 2023-01-16 DIAGNOSIS — Z8551 Personal history of malignant neoplasm of bladder: Secondary | ICD-10-CM | POA: Diagnosis not present

## 2023-01-16 DIAGNOSIS — E785 Hyperlipidemia, unspecified: Secondary | ICD-10-CM | POA: Diagnosis present

## 2023-01-16 DIAGNOSIS — Q211 Atrial septal defect, unspecified: Secondary | ICD-10-CM

## 2023-01-16 DIAGNOSIS — G4733 Obstructive sleep apnea (adult) (pediatric): Secondary | ICD-10-CM | POA: Diagnosis present

## 2023-01-16 DIAGNOSIS — Z79899 Other long term (current) drug therapy: Secondary | ICD-10-CM | POA: Diagnosis not present

## 2023-01-16 DIAGNOSIS — Z888 Allergy status to other drugs, medicaments and biological substances status: Secondary | ICD-10-CM | POA: Diagnosis not present

## 2023-01-16 DIAGNOSIS — F17210 Nicotine dependence, cigarettes, uncomplicated: Secondary | ICD-10-CM | POA: Diagnosis present

## 2023-01-16 DIAGNOSIS — I340 Nonrheumatic mitral (valve) insufficiency: Secondary | ICD-10-CM

## 2023-01-16 DIAGNOSIS — K5731 Diverticulosis of large intestine without perforation or abscess with bleeding: Secondary | ICD-10-CM | POA: Diagnosis present

## 2023-01-16 DIAGNOSIS — Z9889 Other specified postprocedural states: Secondary | ICD-10-CM

## 2023-01-16 DIAGNOSIS — I252 Old myocardial infarction: Secondary | ICD-10-CM

## 2023-01-16 DIAGNOSIS — Z95818 Presence of other cardiac implants and grafts: Secondary | ICD-10-CM

## 2023-01-16 DIAGNOSIS — I480 Paroxysmal atrial fibrillation: Secondary | ICD-10-CM | POA: Diagnosis not present

## 2023-01-16 DIAGNOSIS — Z7901 Long term (current) use of anticoagulants: Secondary | ICD-10-CM

## 2023-01-16 DIAGNOSIS — I083 Combined rheumatic disorders of mitral, aortic and tricuspid valves: Secondary | ICD-10-CM | POA: Diagnosis not present

## 2023-01-16 DIAGNOSIS — N183 Chronic kidney disease, stage 3 unspecified: Secondary | ICD-10-CM | POA: Diagnosis present

## 2023-01-16 DIAGNOSIS — K5791 Diverticulosis of intestine, part unspecified, without perforation or abscess with bleeding: Secondary | ICD-10-CM | POA: Diagnosis present

## 2023-01-16 DIAGNOSIS — Z952 Presence of prosthetic heart valve: Secondary | ICD-10-CM | POA: Diagnosis not present

## 2023-01-16 DIAGNOSIS — I130 Hypertensive heart and chronic kidney disease with heart failure and stage 1 through stage 4 chronic kidney disease, or unspecified chronic kidney disease: Secondary | ICD-10-CM | POA: Diagnosis present

## 2023-01-16 DIAGNOSIS — J90 Pleural effusion, not elsewhere classified: Secondary | ICD-10-CM | POA: Diagnosis not present

## 2023-01-16 DIAGNOSIS — J441 Chronic obstructive pulmonary disease with (acute) exacerbation: Secondary | ICD-10-CM

## 2023-01-16 DIAGNOSIS — K219 Gastro-esophageal reflux disease without esophagitis: Secondary | ICD-10-CM | POA: Diagnosis present

## 2023-01-16 DIAGNOSIS — J811 Chronic pulmonary edema: Secondary | ICD-10-CM | POA: Diagnosis not present

## 2023-01-16 DIAGNOSIS — I082 Rheumatic disorders of both aortic and tricuspid valves: Secondary | ICD-10-CM | POA: Diagnosis present

## 2023-01-16 HISTORY — PX: TRANSCATHETER MITRAL EDGE TO EDGE REPAIR: CATH118311

## 2023-01-16 HISTORY — DX: Other specified postprocedural states: Z98.890

## 2023-01-16 HISTORY — DX: Headache, unspecified: R51.9

## 2023-01-16 LAB — CBC
HCT: 27.2 % — ABNORMAL LOW (ref 36.0–46.0)
Hemoglobin: 8.4 g/dL — ABNORMAL LOW (ref 12.0–15.0)
MCH: 29.5 pg (ref 26.0–34.0)
MCHC: 30.9 g/dL (ref 30.0–36.0)
MCV: 95.4 fL (ref 80.0–100.0)
Platelets: 168 10*3/uL (ref 150–400)
RBC: 2.85 MIL/uL — ABNORMAL LOW (ref 3.87–5.11)
RDW: 18.6 % — ABNORMAL HIGH (ref 11.5–15.5)
WBC: 7 10*3/uL (ref 4.0–10.5)
nRBC: 0 % (ref 0.0–0.2)

## 2023-01-16 LAB — BASIC METABOLIC PANEL
Anion gap: 13 (ref 5–15)
BUN: 32 mg/dL — ABNORMAL HIGH (ref 8–23)
CO2: 25 mmol/L (ref 22–32)
Calcium: 9.3 mg/dL (ref 8.9–10.3)
Chloride: 101 mmol/L (ref 98–111)
Creatinine, Ser: 1.5 mg/dL — ABNORMAL HIGH (ref 0.44–1.00)
GFR, Estimated: 36 mL/min — ABNORMAL LOW (ref >60)
Glucose, Bld: 96 mg/dL (ref 70–99)
Potassium: 4 mmol/L (ref 3.5–5.1)
Sodium: 139 mmol/L (ref 135–145)

## 2023-01-16 LAB — POCT ACTIVATED CLOTTING TIME
Activated Clotting Time: 207 s
Activated Clotting Time: 177 s

## 2023-01-16 LAB — ECHO TEE
MV M vel: 5.99 m/s
MV Peak grad: 143.5 mm[Hg]
P 1/2 time: 209 ms

## 2023-01-16 LAB — SARS CORONAVIRUS 2 BY RT PCR: SARS Coronavirus 2 by RT PCR: NEGATIVE

## 2023-01-16 LAB — PREPARE RBC (CROSSMATCH)

## 2023-01-16 LAB — BRAIN NATRIURETIC PEPTIDE: B Natriuretic Peptide: 392.2 pg/mL — ABNORMAL HIGH (ref 0.0–100.0)

## 2023-01-16 LAB — MRSA NEXT GEN BY PCR, NASAL: MRSA by PCR Next Gen: NOT DETECTED

## 2023-01-16 LAB — PROTIME-INR
INR: 1.3 — ABNORMAL HIGH (ref 0.8–1.2)
Prothrombin Time: 15.9 s — ABNORMAL HIGH (ref 11.4–15.2)

## 2023-01-16 SURGERY — TRANSCATHETER MITRAL EDGE TO EDGE REPAIR
Anesthesia: General

## 2023-01-16 MED ORDER — ONDANSETRON HCL 4 MG/2ML IJ SOLN: 4.0000 mg | Freq: Four times a day (QID) | INTRAMUSCULAR | Status: DC | PRN

## 2023-01-16 MED ORDER — HYDRALAZINE HCL 20 MG/ML IJ SOLN
5.0000 mg | INTRAMUSCULAR | Status: DC | PRN
Start: 2023-01-16 — End: 2023-01-17

## 2023-01-16 MED ORDER — HEPARIN (PORCINE) IN NACL 2000-0.9 UNIT/L-% IV SOLN
INTRAVENOUS | Status: DC | PRN
  Administered 2023-01-16 (×3): 1000 mL

## 2023-01-16 MED ORDER — AMIODARONE HCL IN DEXTROSE 360-4.14 MG/200ML-% IV SOLN
30.0000 mg/h | INTRAVENOUS | Status: DC
  Administered 2023-01-16 – 2023-01-17 (×2): 30 mg/h via INTRAVENOUS
  Filled 2023-01-16: qty 200

## 2023-01-16 MED ORDER — SODIUM CHLORIDE 0.9% FLUSH
3.0000 mL | Freq: Two times a day (BID) | INTRAVENOUS | Status: DC
  Administered 2023-01-16 – 2023-01-17 (×2): 3 mL via INTRAVENOUS

## 2023-01-16 MED ORDER — PROPOFOL 500 MG/50ML IV EMUL
INTRAVENOUS | Status: DC | PRN
Start: 2023-01-16 — End: 2023-01-16
  Administered 2023-01-16: 50 ug/kg/min via INTRAVENOUS

## 2023-01-16 MED ORDER — AMIODARONE HCL IN DEXTROSE 360-4.14 MG/200ML-% IV SOLN
INTRAVENOUS | Status: DC | PRN
Start: 2023-01-16 — End: 2023-01-16
  Administered 2023-01-16: 60 mg/h via INTRAVENOUS

## 2023-01-16 MED ORDER — LABETALOL HCL 5 MG/ML IV SOLN: 10.0000 mg | INTRAVENOUS | Status: DC | PRN

## 2023-01-16 MED ORDER — EPHEDRINE SULFATE-NACL 50-0.9 MG/10ML-% IV SOSY
PREFILLED_SYRINGE | INTRAVENOUS | Status: DC | PRN
  Administered 2023-01-16: 5 mg via INTRAVENOUS

## 2023-01-16 MED ORDER — ROCURONIUM BROMIDE 10 MG/ML (PF) SYRINGE
PREFILLED_SYRINGE | INTRAVENOUS | Status: DC | PRN
  Administered 2023-01-16: 50 mg via INTRAVENOUS
  Administered 2023-01-16: 30 mg via INTRAVENOUS

## 2023-01-16 MED ORDER — SODIUM CHLORIDE 0.9 % IV SOLN: INTRAVENOUS | Status: DC

## 2023-01-16 MED ORDER — APIXABAN 5 MG PO TABS
5.0000 mg | ORAL_TABLET | Freq: Two times a day (BID) | ORAL | Status: DC
  Administered 2023-01-16 – 2023-01-17 (×3): 5 mg via ORAL
  Filled 2023-01-16 (×3): qty 1

## 2023-01-16 MED ORDER — HEPARIN SODIUM (PORCINE) 1000 UNIT/ML IJ SOLN
INTRAMUSCULAR | Status: DC | PRN
  Administered 2023-01-16: 2000 [IU] via INTRAVENOUS
  Administered 2023-01-16: 3000 [IU] via INTRAVENOUS
  Administered 2023-01-16: 7000 [IU] via INTRAVENOUS

## 2023-01-16 MED ORDER — LEVALBUTEROL TARTRATE 45 MCG/ACT IN AERO
1.0000 | INHALATION_SPRAY | RESPIRATORY_TRACT | Status: DC | PRN
Start: 2023-01-16 — End: 2023-01-17

## 2023-01-16 MED ORDER — SODIUM CHLORIDE 0.9 % IV SOLN: 250.0000 mL | INTRAVENOUS | Status: DC | PRN

## 2023-01-16 MED ORDER — SUGAMMADEX SODIUM 200 MG/2ML IV SOLN
INTRAVENOUS | Status: DC | PRN
  Administered 2023-01-16: 200 mg via INTRAVENOUS

## 2023-01-16 MED ORDER — AMIODARONE IV BOLUS ONLY 150 MG/100ML
INTRAVENOUS | Status: DC | PRN
  Administered 2023-01-16: 150 mg via INTRAVENOUS

## 2023-01-16 MED ORDER — AMIODARONE HCL IN DEXTROSE 360-4.14 MG/200ML-% IV SOLN
INTRAVENOUS | Status: AC
  Filled 2023-01-16: qty 200

## 2023-01-16 MED ORDER — AMIODARONE HCL IN DEXTROSE 360-4.14 MG/200ML-% IV SOLN
60.0000 mg/h | INTRAVENOUS | Status: AC
  Administered 2023-01-16: 60 mg/h via INTRAVENOUS
  Filled 2023-01-16: qty 200

## 2023-01-16 MED ORDER — CHLORHEXIDINE GLUCONATE 0.12 % MT SOLN
15.0000 mL | Freq: Once | OROMUCOSAL | Status: AC
  Administered 2023-01-16: 15 mL via OROMUCOSAL
  Filled 2023-01-16: qty 15

## 2023-01-16 MED ORDER — LACTATED RINGERS IV SOLN: INTRAVENOUS | Status: DC

## 2023-01-16 MED ORDER — CHLORHEXIDINE GLUCONATE 4 % EX SOLN: 60.0000 mL | Freq: Once | CUTANEOUS | Status: DC

## 2023-01-16 MED ORDER — CEFAZOLIN SODIUM-DEXTROSE 2-4 GM/100ML-% IV SOLN
2.0000 g | INTRAVENOUS | Status: AC
  Administered 2023-01-16: 2 g via INTRAVENOUS
  Filled 2023-01-16: qty 100

## 2023-01-16 MED ORDER — PREGABALIN 50 MG PO CAPS
50.0000 mg | ORAL_CAPSULE | Freq: Two times a day (BID) | ORAL | Status: DC
  Administered 2023-01-16 – 2023-01-17 (×2): 50 mg via ORAL
  Filled 2023-01-16 (×2): qty 1

## 2023-01-16 MED ORDER — ACETAMINOPHEN 325 MG PO TABS: 650.0000 mg | ORAL_TABLET | ORAL | Status: DC | PRN

## 2023-01-16 MED ORDER — LIDOCAINE-EPINEPHRINE 1 %-1:100000 IJ SOLN
INTRAMUSCULAR | Status: AC
  Filled 2023-01-16: qty 1

## 2023-01-16 MED ORDER — MONTELUKAST SODIUM 10 MG PO TABS
10.0000 mg | ORAL_TABLET | Freq: Every evening | ORAL | Status: DC
  Administered 2023-01-16: 10 mg via ORAL
  Filled 2023-01-16: qty 1

## 2023-01-16 MED ORDER — PANTOPRAZOLE SODIUM 40 MG PO TBEC
40.0000 mg | DELAYED_RELEASE_TABLET | Freq: Every day | ORAL | Status: DC
  Administered 2023-01-16 – 2023-01-17 (×2): 40 mg via ORAL
  Filled 2023-01-16 (×2): qty 1

## 2023-01-16 MED ORDER — ROSUVASTATIN CALCIUM 5 MG PO TABS
5.0000 mg | ORAL_TABLET | ORAL | Status: DC
  Administered 2023-01-16: 5 mg via ORAL
  Filled 2023-01-16: qty 1

## 2023-01-16 MED ORDER — FENTANYL CITRATE (PF) 100 MCG/2ML IJ SOLN
INTRAMUSCULAR | Status: AC
  Filled 2023-01-16: qty 2

## 2023-01-16 MED ORDER — DOCUSATE SODIUM 100 MG PO CAPS
100.0000 mg | ORAL_CAPSULE | Freq: Every day | ORAL | Status: DC
  Administered 2023-01-16 – 2023-01-17 (×2): 100 mg via ORAL
  Filled 2023-01-16 (×2): qty 1

## 2023-01-16 MED ORDER — PROPOFOL 10 MG/ML IV BOLUS
INTRAVENOUS | Status: DC | PRN
  Administered 2023-01-16: 40 mg via INTRAVENOUS
  Administered 2023-01-16: 80 mg via INTRAVENOUS

## 2023-01-16 MED ORDER — ETOMIDATE 2 MG/ML IV SOLN
INTRAVENOUS | Status: DC | PRN
  Administered 2023-01-16: 12 mg via INTRAVENOUS

## 2023-01-16 MED ORDER — IPRATROPIUM-ALBUTEROL 0.5-2.5 (3) MG/3ML IN SOLN
3.0000 mL | Freq: Once | RESPIRATORY_TRACT | Status: AC
Start: 2023-01-16 — End: 2023-01-16
  Administered 2023-01-16: 3 mL via RESPIRATORY_TRACT
  Filled 2023-01-16: qty 3

## 2023-01-16 MED ORDER — FENTANYL CITRATE (PF) 100 MCG/2ML IJ SOLN
INTRAMUSCULAR | Status: DC | PRN
  Administered 2023-01-16: 25 ug via INTRAVENOUS

## 2023-01-16 MED ORDER — DEXAMETHASONE SODIUM PHOSPHATE 10 MG/ML IJ SOLN
INTRAMUSCULAR | Status: DC | PRN
  Administered 2023-01-16: 4 mg via INTRAVENOUS

## 2023-01-16 MED ORDER — AMIODARONE HCL 150 MG/3ML IV SOLN
INTRAVENOUS | Status: AC
  Filled 2023-01-16: qty 3

## 2023-01-16 MED ORDER — FUROSEMIDE 40 MG PO TABS
40.0000 mg | ORAL_TABLET | Freq: Every day | ORAL | Status: DC
  Administered 2023-01-16 – 2023-01-17 (×2): 40 mg via ORAL
  Filled 2023-01-16 (×2): qty 1

## 2023-01-16 MED ORDER — ONDANSETRON HCL 4 MG/2ML IJ SOLN
INTRAMUSCULAR | Status: DC | PRN
  Administered 2023-01-16: 4 mg via INTRAVENOUS

## 2023-01-16 MED ORDER — MOMETASONE FURO-FORMOTEROL FUM 200-5 MCG/ACT IN AERO
2.0000 | INHALATION_SPRAY | Freq: Two times a day (BID) | RESPIRATORY_TRACT | Status: DC
  Administered 2023-01-17: 2 via RESPIRATORY_TRACT
  Filled 2023-01-16 (×2): qty 8.8

## 2023-01-16 MED ORDER — NOREPINEPHRINE 4 MG/250ML-% IV SOLN
INTRAVENOUS | Status: DC | PRN
Start: 2023-01-16 — End: 2023-01-16
  Administered 2023-01-16: 2 ug/min via INTRAVENOUS

## 2023-01-16 MED ORDER — SODIUM CHLORIDE 0.9% FLUSH: 3.0000 mL | INTRAVENOUS | Status: DC | PRN

## 2023-01-16 MED ORDER — TRAZODONE HCL 50 MG PO TABS: 25.0000 mg | ORAL_TABLET | Freq: Every evening | ORAL | Status: DC | PRN

## 2023-01-16 SURGICAL SUPPLY — 18 items
CATH MITRA STEERABLE GUIDE (CATHETERS) ×1 IMPLANT
CATH SWAN GANZ 7F STRAIGHT (CATHETERS) ×1 IMPLANT
CLIP MITRA G4 DELIVERY SYS NT (Clip) ×1 IMPLANT
CLOSURE PERCLOSE PROSTYLE (VASCULAR PRODUCTS) ×2 IMPLANT
KIT HEART LEFT (KITS) ×4 IMPLANT
KIT MICROPUNCTURE NIT STIFF (SHEATH) ×2 IMPLANT
KIT VERSACROSS LRG ACCESS (CATHETERS) ×1 IMPLANT
PACK CARDIAC CATHETERIZATION (CUSTOM PROCEDURE TRAY) ×2 IMPLANT
PAD SORBX EP SHIELD 16.5X12 (MISCELLANEOUS) ×1 IMPLANT
SHEATH DILAT COONS TAPER 22F (SHEATH) ×1 IMPLANT
SHEATH PINNACLE 8F 10CM (SHEATH) ×1 IMPLANT
SHEATH PROBE COVER 6X72 (BAG) ×3 IMPLANT
STOPCOCK MORSE 400PSI 3WAY (MISCELLANEOUS) ×12 IMPLANT
SYSTEM MITRACLIP G4 (SYSTAGENIX WOUND MANAGEMENT) ×1 IMPLANT
TRANSDUCER W/STOPCOCK (MISCELLANEOUS) ×2 IMPLANT
TUBING ART PRESS 72 MALE/FEM (TUBING) ×2 IMPLANT
TUBING CIL FLEX 10 FLL-RA (TUBING) ×1 IMPLANT
WIRE EMERALD 3MM-J .035X150CM (WIRE) ×1 IMPLANT

## 2023-01-16 NOTE — Progress Notes (Signed)
  [facility]   MULTIDISCIPLINARY HEART VALVE TEAM  Patient doing well s/p TEER. She is hemodynamically stable. Groin site remains stable. Plan to transfer to from cath lab holding to [facility] when bed available.  Early ambulation after bedrest completed and hopeful discharge over the next 24-48 hours.    Georgie Chard NP-C Structural Heart Team  Phone: [phone]

## 2023-01-16 NOTE — Progress Notes (Addendum)
Dr. Maple Hudson made aware of PT-INR result. Ok per Dr. Maple Hudson. Dr. Excell Seltzer also made aware of PT-15.9 and INR 1.3. OK per Dr. Excell Seltzer.

## 2023-01-16 NOTE — Progress Notes (Signed)
Dr. Melba Coon made aware of patient's BP readings this morning- 100/40 and 99/38. No new orders received. Patient denies any complaints.

## 2023-01-16 NOTE — Anesthesia Procedure Notes (Signed)
Procedure Name: Intubation Date/Time: 01/16/2023 9:01 AM  Performed by: Alease Medina, CRNAPre-anesthesia Checklist: Patient identified, Emergency Drugs available, Suction available and Patient being monitored Patient Re-evaluated:Patient Re-evaluated prior to induction Oxygen Delivery Method: Circle system utilized Preoxygenation: Pre-oxygenation with 100% oxygen Induction Type: IV induction Ventilation: Mask ventilation without difficulty Laryngoscope Size: 3 and Glidescope Grade View: Grade I Tube type: Oral Tube size: 7.5 mm Number of attempts: 1 Airway Equipment and Method: Stylet and Oral airway Placement Confirmation: ETT inserted through vocal cords under direct vision, positive ETCO2 and breath sounds checked- equal and bilateral Secured at: 19 cm Tube secured with: Tape Dental Injury: Teeth and Oropharynx as per pre-operative assessment

## 2023-01-16 NOTE — Progress Notes (Signed)
Dr. Melba Coon made aware of patient's CBC result. Hemoglobin 8.4 and Hematocrit 27.2. No new orders received at this time.

## 2023-01-16 NOTE — Discharge Instructions (Signed)

## 2023-01-16 NOTE — Transfer of Care (Signed)
Immediate Anesthesia Transfer of Care Note  Patient: Dana Horton  Procedure(s) Performed: TRANSCATHETER MITRAL EDGE TO EDGE REPAIR TRANSESOPHAGEAL ECHOCARDIOGRAM RIGHT HEART CATH CARDIOVERSION (CATH LAB)  Patient Location: Cath Lab  Anesthesia Type:General  Level of Consciousness: drowsy and patient cooperative  Airway & Oxygen Therapy: Patient Spontanous Breathing and Patient connected to face mask oxygen  Post-op Assessment: Report given to RN, Post -op Vital signs reviewed and stable, and Patient moving all extremities X 4  Post vital signs: Reviewed and stable  Last Vitals:  Vitals Value Taken Time  BP 93/30 01/16/23 1110  Temp    Pulse 71 01/16/23 1112  Resp 21 01/16/23 1112  SpO2 100 % 01/16/23 1112  Vitals shown include unfiled device data.  Last Pain:  Vitals:   01/16/23 0633  PainSc: 0-No pain      Patients Stated Pain Goal: 0 (01/16/23 8295)  Complications: There were no known notable events for this encounter.

## 2023-01-16 NOTE — Anesthesia Procedure Notes (Addendum)
Arterial Line Insertion Start/End10/23/2024 9:01 AM, 01/16/2023 9:11 AM Performed by: Val Eagle, MD, anesthesiologist  Patient location: O[facility] procedure area. Preanesthetic checklist: patient identified, IV checked, risks and benefits discussed, surgical consent, monitors and equipment checked, pre-op evaluation, timeout performed and anesthesia consent Right, brachial was placed Catheter size: 20 G Hand hygiene performed  and maximum sterile barriers used   Attempts: 1 Procedure performed using ultrasound guided (image not available, [place] lost power and image) technique. Ultrasound Notes:anatomy identified, needle tip was noted to be adjacent to the nerve/plexus identified and no ultrasound evidence of intravascular and/or intraneural injection Following insertion, dressing applied and Biopatch. Post procedure assessment: normal and unchanged  Patient tolerated the procedure well with no immediate complications.

## 2023-01-16 NOTE — Anesthesia Preprocedure Evaluation (Signed)
Anesthesia Evaluation  Patient identified by MRN, date of birth, ID band Patient awake    Reviewed: Allergy & Precautions, NPO status , Patient's Chart, lab work & pertinent test results  History of Anesthesia Complications Negative for: history of anesthetic complications  Airway Mallampati: IV  TM Distance: >3 FB Neck ROM: Limited    Dental  (+) Edentulous Upper, Edentulous Lower, Upper Dentures, Lower Dentures   Pulmonary shortness of breath, sleep apnea, Continuous Positive Airway Pressure Ventilation and Oxygen sleep apnea , COPD, Current Smoker and Patient abstained from smoking.   + rhonchi        Cardiovascular hypertension, Pt. on medications and Pt. on home beta blockers + Past MI and +CHF  + dysrhythmias Atrial Fibrillation + Valvular Problems/Murmurs MR  Rhythm:Regular  1. Left ventricular ejection fraction, by estimation, is 65 to 70%. The  left ventricle has normal function.   2. Right ventricular systolic function is normal. The right ventricular  size is normal. There is severely elevated pulmonary artery systolic  pressure. The estimated right ventricular systolic pressure is 60.7 mmHg.   3. Left atrial size was severely dilated. No left atrial/left atrial  appendage thrombus was detected.   4. Right atrial size was moderately dilated.   5. A small pericardial effusion is present. The pericardial effusion is  circumferential.   6. Flail P3 leaflet with possible cleft between P2-P3. The mitral valve  is abnormal. Severe mitral valve regurgitation.   7. Tricuspid valve regurgitation is moderate.   8. The aortic valve is tricuspid. Aortic valve regurgitation is severe.  No aortic stenosis is present.   9. There is Moderate (Grade III) plaque involving the descending aorta.  10. Severely dilated pulmonary artery.     Neuro/Psych  Headaches  Neuromuscular disease    GI/Hepatic Neg liver ROS,GERD  ,,  Endo/Other     Renal/GU CRFRenal disease     Musculoskeletal  (+) Arthritis ,    Abdominal   Peds  Hematology  (+) Blood dyscrasia, anemia Lab Results      Component                Value               Date                      WBC                      7.0                 01/16/2023                HGB                      8.4 (L)             01/16/2023                HCT                      27.2 (L)            01/16/2023                MCV                      95.4  01/16/2023                PLT                      168                 01/16/2023              Anesthesia Other Findings   Reproductive/Obstetrics                              Anesthesia Physical Anesthesia Plan  ASA: 4  Anesthesia Plan: General   Post-op Pain Management: Minimal or no pain anticipated   Induction: Intravenous and Rapid sequence  PONV Risk Score and Plan: 2 and Ondansetron, Dexamethasone, Propofol infusion and TIVA  Airway Management Planned: Oral ETT and Video Laryngoscope Planned  Additional Equipment: Arterial line  Intra-op Plan:   Post-operative Plan: Possible Post-op intubation/ventilation  Informed Consent: I have reviewed the patients History and Physical, chart, labs and discussed the procedure including the risks, benefits and alternatives for the proposed anesthesia with the patient or authorized representative who has indicated his/her understanding and acceptance.     Dental advisory given  Plan Discussed with: CRNA  Anesthesia Plan Comments:          Anesthesia Quick Evaluation

## 2023-01-16 NOTE — Discharge Summary (Incomplete)
[facility]   MULTIDISCIPLINARY HEART VALVE TEAM  Discharge Summary    Patient ID: Dana Horton MRN: [id number]; DOB: [birth year]  Admit date: 01/16/2023 Discharge date: 01/17/2023  Primary Care Provider: Crist Fat, MD  Primary Cardiologist: Dana Herrlich, MD / Dr. Excell Seltzer, MD (TEER)  Discharge Diagnoses    Principal Problem:   S/P mitral valve clip implantation Active Problems:   Essential hypertension   OSA on CPAP   Chronic diastolic heart failure (HCC)   Chronic anticoagulation   Chronic obstructive pulmonary disease (HCC)   History of bladder cancer   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   CKD (chronic kidney disease) stage 3, GFR 30-59 ml/min (HCC)   Tobacco abuse   GERD without esophagitis   Severe mitral regurgitation   Blood loss anemia  Allergies Allergies  Allergen Reactions   Albuterol Palpitations   Lisinopril Cough    Diagnostic Studies/Procedures    mTEER: 01/16/2023   Preoperative Diagnosis:Severe Symptomatic Mitral Regurgitation (Stage D)   Postoperative Diagnosis:    Same    Procedure Performed: Ultrasound-guided right transfemoral venous access Double PreClose right femoral vein Right heart catheterization Transseptal puncture using Bailess RF needle Mitral valve repair with MitraClip NT Synchronized cardioversion   Surgeon: Dana Chapman, MD  Co-Surgeon: Dana Skeans, MD   Echocardiographer: Dana Lam, MD   Anesthesiologist: Dana Sox, MD   Device Implant: Mitraclip NT, Model # [id number]   Procedural Indication: Severe Non-rheumatic Mitral Regurgitation (Stage D)   Final Conclusion: Successful transcatheter edge to edge repair of the mitral valve with a MitraClip NT device positioned A3/P3, reducing MR from 4+ down to 1+.   _____________   Echocardiogram 01/17/2023:  1. Left ventricular ejection fraction, by estimation, is 65 to 70%. Left   ventricular ejection fraction by 3D volume is 70 %. The left ventricle has  normal function. The left ventricle has no regional wall motion  abnormalities. There is severe asymmetric   left ventricular hypertrophy. Left ventricular diastolic parameters are  indeterminate.   2. Right ventricular systolic function is normal. The right ventricular  size is normal.   3. Left atrial size was moderately dilated.   4. Right atrial size was moderately dilated.   5. Post placement of one NT Mitra Clip device in the A3 P3 position. The  mitral valve has been repaired/replaced. Mild to moderate mitral valve  regurgitation. Moderate mitral stenosis. The mean mitral valve gradient is  8.0 mmHg with average heart rate  of 68 bpm. There is a Mitra-Clip present in the mitral position. Procedure  Date: 01/16/2023.   6. The aortic valve is tricuspid. Aortic valve regurgitation is severe.  No aortic stenosis is present.   7. The inferior vena cava is normal in size with greater than 50%  respiratory variability, suggesting right atrial pressure of 3 mmHg.   8. Evidence of atrial level shunting detected by color flow Doppler.  There is a small atrial septal defect with predominantly left to right  shunting across the atrial septum.   9. Patient imaged in sinus rhythm.   History of Present Illness     Dana Horton is a 77 y.o. female with a history of uncontrolled atrial fibrillation on Eliquis, HFpEF, HTN, current tobacco use with COPD, OSA on CPAP, CKD stage IIIa, hx of bladder cancer, HLD, GERD, iron deficiency anemia, and newly dx severe mitral regurgitation/tricuspid regurgitation/aortic regurgitation who presented to [facility] on 01/16/2023 for planned TEER.   Ms. Dana Horton has  a long hx of uncontrolled atrial fibrillation. She was initially seen by cardiology in 2018 foro AF RVR. Echocardiogram at that time only showed mild MR/TR. LHC with non-obstructive CAD. She was treated for  atrial fibrillation and was last to follow up until 07/2022. ZIO at that time showed low AF burden after being placed on Flecainide.   More recently she was hospitalized with acute respiratory distress, acute on chronic diastolic CHF, and AF with RVR. Cardiology was consulted. Echocardiogram showed normal LVEF at 65-70% with GIII DD with evidence of severe MR and moderate TR. 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 Also noted to have moderate grade III plaquing of the aorta and severely dilated pulmonary artery. R/LHC performed 01/02/2023 that showed normal coronary vessels. She was seen by the structural heart team in consultation with plans for mTEER after discharge. CT chest was performed in the setting of unintentional weight loss with long hx of tobacco use was performed that showed multiple incidental findings that include enlarged mediastinal and bilateral hilar lymph nodes, new RUL 4mm solid pulmonary nodule, 2cm left kidney lesion concerning for renal neoplasm with recommendations for further evaluation with renal protocol MRI or CT.   The patient was evaluated by the multidisciplinary valve team and felt to have severe, symptomatic mitral regurgitation and to be a suitable candidate for TEER, which was set up for 01/16/2023.   Hospital Course     Severe mitral regurgitation: s/p successful transcatheter edge to edge repair of the mitral valve with a MitraClip NT device positioned A3/P3, reducing MR from 4+ down to 1+.  Resumed on Eliquis 2 hours post procedure given AF with RVR s/p DCCV during mTEER. IV Amiodarone initiated however has been having trouble with nausea with home PO dose of 200mg . Maintaining NSR today. See plan below. Echo today with stable MitraClip NT placement in the A3/P3 position. No indication for SBE as she has full dentures. She has ambulated with CRI with no issues. Post procedure  instructions reviewed with understanding. Plan close TOC follow up next week.    Persistent atrial fibrillation: Patient has a long hx of previously uncontrolled atrial fibrillation on previously on flecainide. Noted to have AF w/ RVR during procedure and is now s/p DCCV to NSR on IV Amiodarone. Patient not tolerating Amiodarone 200mg  daily at home secondary to severe nausea. IV Amiodarone stopped with a plan to attempt Amio 100mg  PO daily and follow tolerance next week. If she continues to have issues with profound nausea, she has been tentatively scheduled with AF Clinic if needed. EKG at [facility] appointment.     Acute post-op anemia: No active bleeding noted however AM labs with Hb at 6.9 therefore transfused with 1 PRBC with follow up H&H. She follows with Dr. Kathrynn Running (for bladder tumors known to bleed and PCP for iron deficiency anemia) who watch this closely. Repeat Hb at 8.6 today. Will need follow up with PCP/Dr. Kathrynn Running. Recheck CBC at [facility] appointment.    HFpEF: Recent admission for acute hypoxic respiratory failure and acute on chronic heart failure treated with diuretics. Appears euvolemic on exam today.  Resumed on home Lasix post op. Cr stable at 1.28.    Ongoing tobacco use with COPD: Home inhalers restarted post op. Home CPAP brought to hospital. Lungs with bilateral rhonchus however at her baseline. Incidental finding of pulmonary nodule requiring follow up CT as below.    CKD stage IIIa: Appears  to be at her baseline. Cr today 1.28.    HLD: Continue Crestor. Last LDL from 05/21/22 at 110    HTN: Stable with no changes needed in therapy    Incidental findings: Pre TEER chest CT with new solid pulmonary nodule of the right upper lobe measuring 4 mm. Recommend non-contrast chest CT in 12 months. Lesion of the posterior left kidney at 2cm concerning for renal neoplasm with recommendations for non-emergent renal protocol MRI or CT. Tentative plan will be to consolidate both non-contrast chest  CT and renal protocol CT to be scheduled when the patient is in [place] for her one month follow up with our team as she lives in [place], [place]  Consultants: None    The patient was seen and examined by Dr. Excell Horton who feels that she is stable and ready for discharge today, 01/17/23.  _____________  Discharge Vitals Blood pressure (!) 107/39, pulse 69, temperature 97.7 F (36.5 C), temperature source Oral, resp. rate 20, height 5\' 3"  (1.6 m), weight 54.4 kg, SpO2 94%.  Filed Weights   01/15/23 1519 01/16/23 0611  Weight: 54.4 kg 54.4 kg   General: Well developed, well nourished, NAD Lungs: Bilateral rhonchi. Breathing is unlabored. CPAP in place.  Cardiovascular: RRR with S1 S2. + murmurs Abdomen: Soft, non-tender, non-distended. No obvious abdominal masses. Extremities: No edema. Groin site stable.  Neuro: Alert and oriented. No focal deficits. No facial asymmetry. MAE spontaneously. Psych: Responds to questions appropriately with normal affect.    Disposition   Pt is being discharged home today in good condition.  Follow-up Plans & Appointments    Follow-up Information     Filbert Schilder, NP Follow up on 01/23/2023.   Specialty: Cardiology Why: Your follow up appointment is scheduled for 10/30 at 9:05AM. You will need to arrive at 8:40AM. Contact information: [address] [place] [address] [phone]         Dana Fat, MD Follow up.   Specialty: Internal Medicine Why: Oct. 29,2024 @ 11:15 AM Contact information: [address] [place] [address] [phone]                Discharge Instructions     Call MD for:  difficulty breathing, headache or visual disturbances   Complete by: As directed    Call MD for:  hives   Complete by: As directed    Call MD for:  persistant dizziness or light-headedness   Complete by: As directed    Call MD for:  persistant nausea and vomiting   Complete by: As directed    Call MD for:   redness, tenderness, or signs of infection (pain, swelling, redness, odor or green/yellow discharge around incision site)   Complete by: As directed    Call MD for:  severe uncontrolled pain   Complete by: As directed    Call MD for:  temperature >100.4   Complete by: As directed    Diet - low sodium heart healthy   Complete by: As directed    Discharge instructions   Complete by: As directed    Home Care Following Your MitraClip Procedure      If you have any questions or concerns you can call the structural heart office at [phone] during normal business hours 8am-4pm. If you have an urgent need after hours or on the weekend, please call [phone] to talk to the on call provider for general cardiology. If you have an emergency that  requires immediate attention, please call 911.   Groin Site Care Refer to this sheet in the next few weeks. These instructions provide you with information on caring for yourself after your procedure. Your caregiver may also give you more specific instructions. Your treatment has been planned according to current medical practices, but problems sometimes occur. Call your caregiver if you have any problems or questions after your procedure. HOME CARE INSTRUCTIONS You may shower 24 hours after the procedure. Remove the bandage (dressing) and gently wash the site with plain soap and water. Gently pat the site dry.  Do not apply powder or lotion to the site.  Do not sit in a bathtub, swimming pool, or whirlpool for 5 to 7 days.  No bending, squatting, or lifting anything over 10 pounds (4.5 kg) as directed by your caregiver.  Inspect the site at least twice daily.  Do not drive home if you are discharged the same day of the procedure. Have someone else drive you.  You may drive 72 hours after the procedure unless otherwise instructed by your caregiver.  What to expect: Any bruising will usually fade within 1 to 2 weeks.  Blood that collects in the tissue  (hematoma) may be painful to the touch. It should usually decrease in size and tenderness within 1 to 2 weeks.  SEEK IMMEDIATE MEDICAL CARE IF: You have unusual pain at the groin site or down the affected leg.  You have redness, warmth, swelling, or pain at the groin site.  You have drainage (other than a small amount of blood on the dressing).  You have chills.  You have a fever or persistent symptoms for more than 72 hours.  You have a fever and your symptoms suddenly get worse.  Your leg becomes pale, cool, tingly, or numb.  You have bleeding from the site. Hold pressure on the site until it subsides.    After MitraClip Checklist  Check  Test Description  Follow up appointment in 1-2 weeks  Most of our patients will see our structural heart APP or your primary cardiologist within 1-2 weeks. Your incision site will be checked and you will be cleared to resume all normal activities if you are doing well.    1 month echo and follow up  You will have an echo to check on your heart valve clip and be seen back in the office by our structural heart APP  Follow up with your primary cardiologist You will need to be seen by your primary cardiologist in the following 3-6 months after your 1 month appointment in the valve clinic. Often times your blood thinners will be changed. This is decided on a case by case basis.   1 year echo and follow up You will have another echo to check on your heart valve after one year and be seen back in the office by our structural heart APP. This your last structural heart visit.  Bacterial endocarditis prophylaxis  You will have to take antibiotics for the rest of your life before all dental procedures (even dental cleanings) to protect your heart valve from potential infection. Antibiotics are also required before some surgeries. Please check with your cardiologist before scheduling any surgeries. Also, please make sure to tell us if you have a penicillin allergy as you  will require an alternative antibiotic.   ______________  Your Implant Identification Card Following your procedure, you will receive an Implant Identification Card, which your doctor will fill out and which you must  carry with you at all times. Show your Implant Identification Card if you report to an emergency room. This card identifies you as a patient who has had a MitraClip device implanted. If you require a magnetic resonance imaging (MRI) scan, tell your doctor or MRI technician that you have a MitraClip device implanted. Test results indicate that patients with the MitraClip device can safely undergo MRI scans under certain conditions described on the card.   Increase activity slowly   Complete by: As directed       Discharge Medications   Allergies as of 01/17/2023       Reactions   Albuterol Palpitations   Lisinopril Cough        Medication List     TAKE these medications    acetaminophen 500 MG tablet Commonly known as: TYLENOL Take 500 mg by mouth every 6 (six) hours as needed for moderate pain or headache.   amiodarone 100 MG tablet Commonly known as: PACERONE Take 1 tablet (100 mg total) by mouth daily. Start taking on: January 18, 2023 What changed:  medication strength how much to take   apixaban 5 MG Tabs tablet Commonly known as: ELIQUIS Take 1 tablet (5 mg total) by mouth 2 (two) times daily.   docusate sodium 100 MG capsule Commonly known as: COLACE Take 100 mg by mouth daily.   empagliflozin 10 MG Tabs tablet Commonly known as: JARDIANCE Take 1 tablet (10 mg total) by mouth daily.   Ferric Maltol 30 MG Caps Take 1 capsule (30 mg total) by mouth daily.   fluticasone 50 MCG/ACT nasal spray Commonly known as: FLONASE Place 2 sprays into both nostrils daily.   furosemide 40 MG tablet Commonly known as: Lasix Take 1 tablet (40 mg total) by mouth daily.   levalbuterol 45 MCG/ACT inhaler Commonly known as: XOPENEX HFA Inhale 1-2 puffs  into the lungs every 4 (four) hours as needed for wheezing. What changed: when to take this   metoprolol tartrate 25 MG tablet Commonly known as: LOPRESSOR Take 0.5 tablets (12.5 mg total) by mouth 2 (two) times daily.   midodrine 10 MG tablet Commonly known as: PROAMATINE Take 1 tablet (10 mg total) by mouth 2 (two) times daily with a meal.   montelukast 10 MG tablet Commonly known as: SINGULAIR Take 1 tablet (10 mg total) by mouth every evening.   ondansetron 4 MG tablet Commonly known as: Zofran Take 1 tablet (4 mg total) by mouth daily as needed for nausea or vomiting.   pantoprazole 40 MG tablet Commonly known as: PROTONIX Take 1 tablet (40 mg total) by mouth daily.   polyethylene glycol 17 g packet Commonly known as: MIRALAX / GLYCOLAX Take 17 g by mouth daily as needed.   potassium chloride SA 20 MEQ tablet Commonly known as: KLOR-CON M Take 1 tablet (20 mEq total) by mouth daily.   pregabalin 50 MG capsule Commonly known as: LYRICA TAKE ONE CAPSULE BY MOUTH TWICE DAILY   rosuvastatin 5 MG tablet Commonly known as: CRESTOR Take 5 mg by mouth every other day. In the morning   Symbicort 160-4.5 MCG/ACT inhaler Generic drug: budesonide-formoterol Inhale 2 puffs into the lungs 2 (two) times daily.   traZODone 50 MG tablet Commonly known as: DESYREL Take 0.5 tablets (25 mg total) by mouth at bedtime as needed for sleep.        Outstanding Labs/Studies   CBC, EKG  Duration of Discharge Encounter   Greater than 30 minutes including physician time.  SignedGeorgie Chard, NP 01/17/2023, 11:53 AM [phone]   Patient seen, examined. Available data reviewed. Agree with findings, assessment, and plan as outlined by Georgie Chard, NP.  The patient is independently interviewed and examined.  She is alert, oriented, no distress.  HEENT is normal, JVP is normal, lungs have diminished breath sounds on the left but good breath sounds on the right, heart is  regular rate and rhythm with a 2/6 systolic ejection murmur at the right upper sternal border and a 2/6 diastolic decrescendo murmur at the left lower sternal border, there is no apical murmur of MR anymore.  Abdomen soft and nontender, right groin site is clear with no hematoma or ecchymosis.  There is no lower extremity edema.  The patient did well with transcatheter edge-to-edge repair of mitral valve yesterday.  Her postoperative day #1 echo shows mild to moderate residual mitral regurgitation and a mean transvalvular gradient of 8 mmHg.  I suspect her gradient is partially increased because of anemia.  Her hemoglobin was 6.9 this morning but increased to 8.6 after 1 unit of packed red blood cells.  She has chronic anemia and I do not think she has any signs of active bleeding.  She appears medically stable for discharge.  She has been in sinus rhythm since undergoing cardioversion during the procedure yesterday.  We are going to try her on a lower dose of amiodarone 100 mg daily as she was quite symptomatic with nausea and anorexia on 200 mg daily.  If she remains symptomatic, I think this should be stopped and she should be transitioned to either Multaq or Tikosyn as an outpatient.  She will have to have an amiodarone washout period.  All of this can be directed through the atrial fibrillation clinic.  From a medical perspective she is stable for discharge today  She has close outpatient follow-up as outlined above.  Tonny Bollman, M.D. 01/17/2023 4:48 PM

## 2023-01-16 NOTE — Op Note (Signed)
[facility]   MULTIDISCIPLINARY HEART TEAM  Date of Procedure:  01/16/2023  Preoperative Diagnosis: Severe Symptomatic Mitral Regurgitation (Stage D)  Postoperative Diagnosis: Same   Procedure Performed: Ultrasound-guided right transfemoral venous access Double PreClose right femoral vein Right heart catheterization Transseptal puncture using Bailess RF needle Mitral valve repair with MitraClip NT Synchronized cardioversion  Surgeon: Micheline Chapman, MD  Co-Surgeon: Alverda Skeans, MD  Echocardiographer: Riley Lam, MD  Anesthesiologist: Corky Sox, MD  Device Implant: Mitraclip NT, Model # [id number]  Procedural Indication: Severe Non-rheumatic Mitral Regurgitation (Stage D)   Brief History: 77 year old woman with recent hospital admission for acute on chronic diastolic heart failure with associated respiratory failure.  She is found to have severe primary mitral regurgitation related to a flail P3 portion of the posterior mitral valve.  The patient is also was noted to have severe aortic insufficiency.  She is medically optimized, evaluated by cardiac surgery and felt to be at prohibitive risk of conventional heart surgery.  After multidisciplinary heart team review of her case, she is referred for transcatheter edge-to-edge repair of the mitral valve.  Echo Findings: Preop:  Normal LV systolic function Severe MR secondary to flail P3, Grade 4+ Severe AI Post-op:  Unchanged LV systolic function 1+ residual MR Severe/unchanged AI  Procedural Details: Prep The patient is brought to the cardiac catheterization lab in the fasting state. General anesthesia is induced. The patient is prepped from the groin to chin. Hemodynamics are monitored via a radial artery line.   Venous Access Using ultrasound guidance, the right femoral vein is punctured. Ultrasound images are captured and stored in the patient's chart. The vein is dilated and 2 Perclose  devices are deplyed at 10' and 2' positions to 'Preclose' the femoral vein. An 8 Fr sheath is inserted.  Right Heart Catheterization: Right heart catheterization is performed using a Swan-Ganz catheter.  Pressures are recorded from the right atrium to the pulmonary capillary wedge position.  A pulmonary artery oxygen saturation is recorded.  After the right heart catheterization, the Swan-Ganz catheter is removed from the body.  The 8 French sheath is changed out for the transseptal dilator (see below).  Transseptal Puncture A Baylis Versacross wire is advanced into the SVC A Baylis transseptal dilator is advanced into the SVC, and the VersaCross RF wire is retracted into the dilator  The transseptal sheath is retracted into the RA under fluoroscopic and echo guidance to obtain position on the posterior fossa where echo measurements are made to assure appropriate access to the mitral valve. Once proper position is confirmed by echo, RF energy is delivered and the VersaCross wire is advanced into the LA without resistance. The dilator and sheath are advanced over the wire where proper position is confirmed by echo and pressure measurement Weight based IV heparin is administered and a therapeutic ACT > 250 is confirmed  Steerable Guide Catheter Insertion The VersaCross wire is positioned at the left upper pulmonary vein The femoral vein is progressively dilated and the 24 Fr Steerable guide catheter is inserted and then directed across the interatrial septum over the wire. Position is confirmed approximately 3 cm into the left atrium The guide is de-aired   MitraClip Insertion The MitraClip NT is prepped per protocol and inserted via the introducer into the steerable guide catheter The Clip Delivery System (CDS) is advanced under fluoro and echo guidance so that the sleeve markers are evenly spaced on each side of the guide marker  MitraClip Positioning in the  Left Atrium (Supravalvular  Alignment) M-knob is applied to bring the Clip towards the mitral valve. Echo guidance is used to avoid contact with LA structures. The Clip arms are opened to 180 degrees 2D and 3D TEE imaging is performed in multiple planes and the Clip is positioned and aligned above the valve using standard steering techniques.  Appropriate trajectory is demonstrated.  The clip is positioned above A3/P3 with proper arm orientation.  Entry into the Left Ventricle and Mitral Valve Leaflet Grasp The Clip is advanced across the mitral valve into the LV, maintaining proper orientation The Clip arms are opened to 120 degrees and the Clip is slowly retracted  Capture of both the anterior and posterior leaflets are visualized by echo and the grippers are dropped  MitraClip Deployment After extensive echo evaluation, reduction in mitral regurgitation is felt to be adequate.  The mean transmitral gradient ranged from 8 to 12 mmHg with the patient in atrial fibrillation with RVR.  Since the patient has been on apixaban, is presently anticoagulated with unfractionated heparin, and has a clear left atrial appendage on life TEE imaging, we elected to proceed with cardioversion so that we could stabilize her hemodynamics and better evaluate her MitraClip result.  Synchronized cardioversion is performed with a single 200 J shock delivered with restoration of sinus rhythm.  Following cardioversion, the mean transmitral gradient is 8 mmHg at a heart rate of about 75 bpm. Following standard protocol, the lock line is removed after testing the lock mechanism. The lock is rechecked and is shown to be intact. The MitraClip device is deployed and the clip delivery system is removed under echo guidance with caution taken to avoid contact with LA structures  Device Removal The clip delivery system is removed under echo guidance The steerable guide catheter is retracted into the right atrium and the interatrial septum is assessed by echo  without evidence of right-to-left shunting or large ASD  Hemostasis The guide catheter is removed over a 0.035" wire and the Perclose sutures are tightened with complete hemostasis and no evidence of hematoma  Estimated blood loss: minimal  There are no immediate procedural complications. The patient is transferred to the post-procedure recovery area in stable condition.   Final Conclusion: Successful transcatheter edge to edge repair of the mitral valve with a MitraClip NT device positioned A3/P3, reducing MR from 4+ down to 1+.    Tonny Bollman 01/16/2023 12:33 PM

## 2023-01-16 NOTE — Op Note (Signed)
    PROCEDURES:    Transcatheter edge to edge mitral valve repair (TEER) NT clip at A3/P3  Synchronized cardioversion  INDICATION: Severe symptomatic mitral regurgitation (Stage D)  SURGEON:  Tonny Bollman, MD CO-SURGEON:  Alverda Skeans, MD  PROCEDURAL DETAILS: General anesthesia is induced.  The patient is prepped and draped.  Baseline transesophageal echo images are obtained and confirm appropriate anatomy for transcatheter edge-to-edge mitral valve repair.  Using vascular ultrasound guidance, the right common femoral vein is accessed via a front wall puncture.  2 Perclose sutures are deployed and an 8 [place] sheath is inserted.  A versa cross wire is advanced into the SVC.  Heparin is administered and a therapeutic ACT is achieved.  Transseptal puncture is performed over the mid posterior portion of the fossa.  The septum is dilated while the MitraClip steerable guide catheter is prepped.  After progressively dilating the right common femoral vein, the 24 French MitraClip steerable guide catheter is inserted and advanced across the interatrial septum.  The dilator and the versa cross wire were carefully removed and the guide tip is appropriately positioned approximately 3 cm across the interatrial septum.  A MitraClip NT device is prepped per protocol.  The device is inserted through the steerable guide catheter with caution taken to avoid air entrapment.  The MitraClip device is positioned above the mitral valve after applying appropriate curve to the guide catheter.  The MitraClip device is then oriented coaxially with the anterior and posterior leaflets of the mitral valve.  Low tidal volume ventilation is initiated.  The clip device is advanced across the mitral valve and pulled back until capture of both the anterior and posterior leaflets is achieved.  Grippers are dropped and the clip is closed under TEE guidance.  Careful TEE assessment is performed and reduction in mitral valve regurgitation  is felt to be appropriate.  Leaflet insertion is verified using 3D imaging.  The patient had transitioned to atrial fibrillation from sinus rhythm.  The left atrial appendage was interrogated and was free of thrombus.  Synchronized cardioversion at 200J was then performed with conversion to normal sinus rhythm.  The MitraClip device is deployed using normal technique and the clip delivery system is removed.  After full TEE assessment, the procedural result is felt to be adequate with reduction of mitral regurgitation to 1+.   PROCEDURE COMPLETION: The steerable guide catheter is pulled back into the right atrium and the interatrial septum is assessed with TEE.  There is no significant septal injury seen and no right to left shunting.  The guide catheter is removed and the Perclose sutures are tightened.    CONCLUSION: Successful transcatheter edge-to-edge mitral valve repair under fluoroscopic and echo guidance, reducing baseline 4+ mitral regurgitation to 1+, clip positioned A3/P3 with NT clip.  Orbie Pyo 01/16/2023 10:48 AM

## 2023-01-16 NOTE — Interval H&P Note (Signed)
History and Physical Interval Note:  01/16/2023 8:10 AM  Dana Horton  has presented today for surgery, with the diagnosis of severe mitral insufficiency.  The various methods of treatment have been discussed with the patient and family. After consideration of risks, benefits and other options for treatment, the patient has consented to  Procedure(s): TRANSCATHETER MITRAL EDGE TO EDGE REPAIR (N/A) TRANSESOPHAGEAL ECHOCARDIOGRAM (N/A) as a surgical intervention.  The patient's history has been reviewed, patient examined, no change in status, stable for surgery.  I have reviewed the patient's chart and labs.  Questions were answered to the patient's satisfaction.     Tonny Bollman

## 2023-01-17 ENCOUNTER — Other Ambulatory Visit: Payer: Self-pay | Admitting: Cardiology

## 2023-01-17 ENCOUNTER — Inpatient Hospital Stay (HOSPITAL_COMMUNITY): Payer: Medicare Other

## 2023-01-17 DIAGNOSIS — R911 Solitary pulmonary nodule: Secondary | ICD-10-CM

## 2023-01-17 DIAGNOSIS — N289 Disorder of kidney and ureter, unspecified: Secondary | ICD-10-CM

## 2023-01-17 DIAGNOSIS — I083 Combined rheumatic disorders of mitral, aortic and tricuspid valves: Secondary | ICD-10-CM | POA: Diagnosis not present

## 2023-01-17 DIAGNOSIS — I340 Nonrheumatic mitral (valve) insufficiency: Secondary | ICD-10-CM

## 2023-01-17 DIAGNOSIS — Z006 Encounter for examination for normal comparison and control in clinical research program: Secondary | ICD-10-CM | POA: Diagnosis not present

## 2023-01-17 DIAGNOSIS — Z952 Presence of prosthetic heart valve: Secondary | ICD-10-CM | POA: Diagnosis not present

## 2023-01-17 DIAGNOSIS — D500 Iron deficiency anemia secondary to blood loss (chronic): Secondary | ICD-10-CM | POA: Insufficient documentation

## 2023-01-17 DIAGNOSIS — Z1152 Encounter for screening for COVID-19: Secondary | ICD-10-CM | POA: Diagnosis not present

## 2023-01-17 DIAGNOSIS — N2889 Other specified disorders of kidney and ureter: Secondary | ICD-10-CM

## 2023-01-17 DIAGNOSIS — K5731 Diverticulosis of large intestine without perforation or abscess with bleeding: Secondary | ICD-10-CM | POA: Diagnosis not present

## 2023-01-17 LAB — CBC
HCT: 22.2 % — ABNORMAL LOW (ref 36.0–46.0)
Hemoglobin: 6.9 g/dL — CL (ref 12.0–15.0)
MCH: 29.2 pg (ref 26.0–34.0)
MCHC: 31.1 g/dL (ref 30.0–36.0)
MCV: 94.1 fL (ref 80.0–100.0)
Platelets: 147 10*3/uL — ABNORMAL LOW (ref 150–400)
RBC: 2.36 MIL/uL — ABNORMAL LOW (ref 3.87–5.11)
RDW: 18.4 % — ABNORMAL HIGH (ref 11.5–15.5)
WBC: 6 10*3/uL (ref 4.0–10.5)
nRBC: 0 % (ref 0.0–0.2)

## 2023-01-17 LAB — BASIC METABOLIC PANEL
Anion gap: 9 (ref 5–15)
BUN: 26 mg/dL — ABNORMAL HIGH (ref 8–23)
CO2: 25 mmol/L (ref 22–32)
Calcium: 8.7 mg/dL — ABNORMAL LOW (ref 8.9–10.3)
Chloride: 103 mmol/L (ref 98–111)
Creatinine, Ser: 1.28 mg/dL — ABNORMAL HIGH (ref 0.44–1.00)
GFR, Estimated: 43 mL/min — ABNORMAL LOW (ref >60)
Glucose, Bld: 123 mg/dL — ABNORMAL HIGH (ref 70–99)
Potassium: 4.1 mmol/L (ref 3.5–5.1)
Sodium: 137 mmol/L (ref 135–145)

## 2023-01-17 LAB — POCT I-STAT EG7
Acid-base deficit: 1 mmol/L (ref 0.0–2.0)
Bicarbonate: 26 mmol/L (ref 20.0–28.0)
Calcium, Ion: 1.27 mmol/L (ref 1.15–1.40)
HCT: 25 % — ABNORMAL LOW (ref 36.0–46.0)
Hemoglobin: 8.5 g/dL — ABNORMAL LOW (ref 12.0–15.0)
O2 Saturation: 78 %
Potassium: 3.7 mmol/L (ref 3.5–5.1)
Sodium: 140 mmol/L (ref 135–145)
TCO2: 28 mmol/L (ref 22–32)
pCO2, Ven: 55.6 mm[Hg] (ref 44–60)
pH, Ven: 7.279 (ref 7.25–7.43)
pO2, Ven: 49 mm[Hg] — ABNORMAL HIGH (ref 32–45)

## 2023-01-17 LAB — HEMOGLOBIN AND HEMATOCRIT, BLOOD
HCT: 27.2 % — ABNORMAL LOW (ref 36.0–46.0)
Hemoglobin: 8.6 g/dL — ABNORMAL LOW (ref 12.0–15.0)

## 2023-01-17 LAB — ECHOCARDIOGRAM COMPLETE
AR max vel: 1.97 cm2
AV Area VTI: 2.14 cm2
AV Area mean vel: 2.02 cm2
AV Mean grad: 9 mm[Hg]
AV Peak grad: 17.6 mm[Hg]
Ao pk vel: 2.1 m/s
Area-P 1/2: 2.11 cm2
Calc EF: 77.7 %
Height: 63 in
MV M vel: 5.01 m/s
MV Peak grad: 100.4 mm[Hg]
MV VTI: 1.22 cm2
P 1/2 time: 357 ms
Radius: 0.6 cm
S' Lateral: 2.2 cm
Single Plane A2C EF: 79.2 %
Single Plane A4C EF: 74.7 %
Weight: 1920 [oz_av]

## 2023-01-17 LAB — POCT ACTIVATED CLOTTING TIME
Activated Clotting Time: 232 s
Activated Clotting Time: 244 s

## 2023-01-17 LAB — PREPARE RBC (CROSSMATCH)

## 2023-01-17 MED ORDER — SODIUM CHLORIDE 0.9% IV SOLUTION: Freq: Once | INTRAVENOUS | Status: AC

## 2023-01-17 MED ORDER — AMIODARONE HCL 100 MG PO TABS: 100.0000 mg | ORAL_TABLET | Freq: Every day | ORAL | 2 refills | Status: DC

## 2023-01-17 MED ORDER — AMIODARONE HCL 100 MG PO TABS
100.0000 mg | ORAL_TABLET | Freq: Every day | ORAL | Status: DC
  Administered 2023-01-17: 100 mg via ORAL
  Filled 2023-01-17: qty 1

## 2023-01-17 MED FILL — Lidocaine Inj 1% w/ Epinephrine-1:100000: INTRAMUSCULAR | Qty: 20 | Status: AC

## 2023-01-17 NOTE — Progress Notes (Signed)
CARDIAC REHAB PHASE I   PRE:  Rate/Rhythm: 71 SR  BP:  Supine: 113/48   SaO2: 95 RA  MODE:  Ambulation: 340 ft   POST:  Rate/Rhythm: 79 SR  BP:  Sitting: 116/55   SaO2: 93 RA  Pt resting in bed comfortably, amb in hallway with standby assist and tolerated well with no s/sx. Pt returned to room and educated on risk factors, exercise guidelines, site care/restrictions, nutrition, smoking cessation, and CRP2 referral to [facility]. 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 All questions answered, pt left EOB with call bell in reach.  [phone]   Jonna Coup, MS, ACSM-CEP 01/17/2023 12:25 PM

## 2023-01-17 NOTE — Progress Notes (Signed)
Mobility Specialist Progress Note:   01/17/23 0900  Therapy Vitals  Pulse Rate 70  Resp (!) 23  BP (!) 100/33  Oxygen Therapy  SpO2 95 %  O2 Device Room Air  Mobility  Activity Ambulated with assistance in hallway  Level of Assistance Contact guard assist, steadying assist  Assistive Device None  Distance Ambulated (ft) 340 ft  Activity Response Tolerated well  Mobility Referral Yes  $Mobility charge 1 Mobility  Mobility Specialist Start Time (ACUTE ONLY) [id number]  Mobility Specialist Stop Time (ACUTE ONLY) 0917  Mobility Specialist Time Calculation (min) (ACUTE ONLY) 13 min    Pre Mobility: 84 HR, 94% SpO2 During Mobility: 92 HR,  91% SpO2 Post Mobility:  81 HR, 92% SpO2  Pt received in bed, agreeable to mobility. No complaints throughout. Was a little wobbly during ambulation, but no actual LOB. Pt left on EOB with call bell and all needs met.  [place] Earlene Plater Mobility Specialist Please contact via Special educational needs teacher or Rehab office at [phone]

## 2023-01-17 NOTE — Progress Notes (Signed)
Written discharge instructions reviewed with patient.  Patient states understanding of instructions including groin site care, medication changes, and follow up appointments.

## 2023-01-17 NOTE — Progress Notes (Signed)
Blood still transfusing @120ml /hr. Handoff report given to [facility], RN and order put in for post transfusion H&H.

## 2023-01-17 NOTE — Progress Notes (Signed)
  Echocardiogram 2D Echocardiogram has been performed.  Sheralyn Boatman R 01/17/2023, 9:00 AM

## 2023-01-17 NOTE — TOC Initial Note (Signed)
Transition of Care [facility] - Initial/Assessment Note    Patient Details  Name: Dana Horton MRN: [id number] Date of Birth: 1945-04-08  [facility]) CM/SW Contact:    Leone Haven, RN Phone Number: 01/17/2023, 11:46 AM  Clinical Narrative:                 From home with spouse, has PCP and insurance on file, states has no HH services in place at this time , she states she has all the DME she needs. States daughter will transport her home at [employer] and family is support system, states gets medications from [facility] in [place].  Pta self ambulatory.   Expected Discharge Plan: Home/Self Care Barriers to Discharge: No Barriers Identified   Patient Goals and CMS Choice Patient states their goals for this hospitalization and ongoing recovery are:: return home   Choice offered to / list presented to : NA      Expected Discharge Plan and Services In-house Referral: NA Discharge Planning Services: CM Consult Post Acute Care Choice: NA Living arrangements for the past 2 months: Single Family Home                 DME Arranged: N/A DME Agency: NA       HH Arranged: NA          Prior Living Arrangements/Services Living arrangements for the past 2 months: Single Family Home Lives with:: Spouse Patient language and need for interpreter reviewed:: Yes Do you feel safe going back to the place where you live?: Yes      Need for Family Participation in Patient Care: Yes (Comment) Care giver support system in place?: No (comment)   Criminal Activity/Legal Involvement Pertinent to Current Situation/Hospitalization: No - Comment as needed  Activities of Daily Living   ADL Screening (condition at time of admission) Independently performs ADLs?: Yes (appropriate for developmental age) Does the patient have a NEW difficulty with bathing/dressing/toileting/self-feeding that is expected to last >3 days?: No Does the patient have a NEW  difficulty with getting in/out of bed, walking, or climbing stairs that is expected to last >3 days?: No Does the patient have a NEW difficulty with communication that is expected to last >3 days?: No Is the patient deaf or have difficulty hearing?: No Does the patient have difficulty seeing, even when wearing glasses/contacts?: No Does the patient have difficulty concentrating, remembering, or making decisions?: No  Permission Sought/Granted Permission sought to share information with : Case Manager Permission granted to share information with : Yes, Verbal Permission Granted              Emotional Assessment Appearance:: Appears stated age Attitude/Demeanor/Rapport: Engaged Affect (typically observed): Appropriate Orientation: : Oriented to Self, Oriented to Place, Oriented to  Time, Oriented to Situation Alcohol / Substance Use: Not Applicable Psych Involvement: No (comment)  Admission diagnosis:  Severe mitral regurgitation [I34.0] Patient Active Problem List   Diagnosis Date Noted   Blood loss anemia 01/17/2023   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   Acute hypoxemic respiratory failure (HCC) 01/01/2023   Severe mitral regurgitation 01/01/2023   Severe aortic regurgitation 01/01/2023   Acute respiratory failure with hypoxia (HCC) 12/24/2022   GERD without esophagitis 12/24/2022   CKD (chronic kidney disease) stage 3, GFR 30-59 ml/min (HCC) 08/21/2022   Lung nodule 08/21/2022   Osteoporosis 08/21/2022   Tobacco abuse 08/21/2022   Symptomatic anemia 08/07/2022   Bladder tumor 08/06/2022   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 06/29/2022   Iron deficiency anemia due to chronic blood loss 06/29/2022   Sleep apnea 05/21/2022   Chronic obstructive pulmonary disease (HCC) 05/21/2022   Hypercholesterolemia 05/21/2022   Osteopenia 05/21/2022   Stage 3a chronic kidney disease (HCC) 05/21/2022   Primary osteoarthritis involving multiple  joints 05/21/2022   History of bladder cancer 05/21/2022   BMI 28.0-28.9,adult 05/21/2022   Bronchitis 03/07/2022   Chronic anticoagulation 06/10/2019   Essential hypertension 04/08/2017   OSA on CPAP 04/08/2017   Chronic diastolic heart failure (HCC)    PCP:  Crist Fat, MD Pharmacy:   [facility], [place] - [address] [place] [address] Phone: [phone] Fax: [phone]  [facility] - [place], [place] - [address] [facility] [address] [facility] [place] [address] Phone: [phone] Fax: [phone]  [id number] - [place], [place] - [address] AT [facility] [address] [place] [address] Phone: [phone] Fax: [phone]  [id number] - [place], [place] - [address] AT [facility] [address] [facility] [address] [place] [address] Phone: [phone] Fax: [phone]     Social Determinants of Health (SDOH) Social History: SDOH Screenings   Food Insecurity: No Food Insecurity (01/16/2023)  Housing: Low Risk  (01/16/2023)  Transportation Needs: No Transportation Needs (01/16/2023)  Utilities: Not At Risk (01/16/2023)  Tobacco Use: High Risk (01/16/2023)   SDOH Interventions:     Readmission Risk Interventions    01/17/2023   11:40 AM 12/27/2022    6:36 PM 08/09/2022    9:38 AM  Readmission Risk Prevention Plan  Post Dischage Appt   Complete  Medication Screening   Complete  Transportation Screening Complete Complete Complete  PCP or Specialist Appt within 5-7 Days  Complete   PCP or Specialist Appt within 3-5 Days Complete    Home Care Screening  Complete   Medication Review (RN CM)  Referral to Pharmacy   HRI or Home Care Consult Complete    Palliative Care Screening Not Applicable    Medication Review (RN Care Manager) Complete

## 2023-01-17 NOTE — TOC Transition Note (Signed)
Transition of Care [facility]) - CM/SW Discharge Note   Patient Details  Name: Dana Horton MRN: [id number] Date of Birth: 1945/04/03  [facility]) CM/SW Contact:  Leone Haven, RN Phone Number: 01/17/2023, 12:16 PM   Clinical Narrative:    For dc today, has no needs .  Has transport home.    Final next level of care: Home/Self Care Barriers to Discharge: No Barriers Identified   Patient Goals and CMS Choice   Choice offered to / list presented to : NA  Discharge Placement                         Discharge Plan and Services Additional resources added to the After Visit Summary for   In-house Referral: NA Discharge Planning Services: CM Consult Post Acute Care Choice: NA          DME Arranged: N/A DME Agency: NA       HH Arranged: NA          Social Determinants of Health (SDOH) Interventions SDOH Screenings   Food Insecurity: No Food Insecurity (01/16/2023)  Housing: Low Risk  (01/16/2023)  Transportation Needs: No Transportation Needs (01/16/2023)  Utilities: Not At Risk (01/16/2023)  Tobacco Use: High Risk (01/16/2023)     Readmission Risk Interventions    01/17/2023   11:40 AM 12/27/2022    6:36 PM 08/09/2022    9:38 AM  Readmission Risk Prevention Plan  Post Dischage Appt   Complete  Medication Screening   Complete  Transportation Screening Complete Complete Complete  PCP or Specialist Appt within 5-7 Days  Complete   PCP or Specialist Appt within 3-5 Days Complete    Home Care Screening  Complete   Medication Review (RN CM)  Referral to Pharmacy   HRI or Home Care Consult Complete    Palliative Care Screening Not Applicable    Medication Review (RN Care Manager) Complete

## 2023-01-18 ENCOUNTER — Encounter (HOSPITAL_COMMUNITY): Payer: Self-pay | Admitting: Cardiovascular Disease

## 2023-01-18 ENCOUNTER — Telehealth: Payer: Self-pay | Admitting: Cardiology

## 2023-01-18 LAB — TYPE AND SCREEN
ABO/RH(D): O POS
Antibody Screen: NEGATIVE
Unit division: 0
Unit division: 0
Unit division: 0
Unit division: 0

## 2023-01-18 LAB — BPAM RBC
Blood Product Expiration Date: 202411192359
Blood Product Expiration Date: 202411192359
Blood Product Expiration Date: 202411202359
Blood Product Expiration Date: 202411202359
ISSUE DATE / TIME: 202410230848
ISSUE DATE / TIME: 202410240505
Unit Type and Rh: 5100
Unit Type and Rh: 5100
Unit Type and Rh: 5100
Unit Type and Rh: 5100

## 2023-01-18 NOTE — Telephone Encounter (Signed)
  HEART [facility] VASCULAR [facility]ENTER   MULTIDISCIPLINARY HEART VALVE TEAM   Patient contacted regarding discharge from [facility] on 01/17/23 and reports she is doing well with no issues.    Patient understands to follow up with provider Georgie Chard on 10/30 Patient understands discharge instructions? Yes  Patient understands medications and regimen? Yes  Patient understands to bring all medications to this visit? Yes   Georgie Chard NP-C Structural Heart Team  Pager: [phone]

## 2023-01-20 NOTE — Anesthesia Postprocedure Evaluation (Signed)
Anesthesia Post Note  Patient: Dana Horton  Procedure(s) Performed: TRANSCATHETER MITRAL EDGE TO EDGE REPAIR     Patient location during evaluation: Cath Lab Anesthesia Type: General Level of consciousness: awake and alert Pain management: pain level controlled Vital Signs Assessment: post-procedure vital signs reviewed and stable Respiratory status: spontaneous breathing, nonlabored ventilation and respiratory function stable Cardiovascular status: blood pressure returned to baseline and stable Postop Assessment: no apparent nausea or vomiting Anesthetic complications: no   There were no known notable events for this encounter.               Alicianna Litchford

## 2023-01-21 ENCOUNTER — Encounter: Payer: Self-pay | Admitting: Internal Medicine

## 2023-01-22 ENCOUNTER — Other Ambulatory Visit: Payer: Self-pay

## 2023-01-22 ENCOUNTER — Telehealth (HOSPITAL_COMMUNITY): Payer: Self-pay

## 2023-01-22 ENCOUNTER — Ambulatory Visit: Payer: Medicare Other | Admitting: Student

## 2023-01-22 ENCOUNTER — Other Ambulatory Visit: Payer: Self-pay | Admitting: Student

## 2023-01-22 ENCOUNTER — Encounter: Payer: Self-pay | Admitting: Student

## 2023-01-22 VITALS — BP 118/52 | HR 64 | Temp 97.5°F | Resp 16 | Ht 63.0 in | Wt 121.1 lb

## 2023-01-22 DIAGNOSIS — Z95818 Presence of other cardiac implants and grafts: Secondary | ICD-10-CM | POA: Diagnosis not present

## 2023-01-22 DIAGNOSIS — R052 Subacute cough: Secondary | ICD-10-CM | POA: Diagnosis not present

## 2023-01-22 DIAGNOSIS — K5791 Diverticulosis of intestine, part unspecified, without perforation or abscess with bleeding: Secondary | ICD-10-CM

## 2023-01-22 DIAGNOSIS — Z9889 Other specified postprocedural states: Secondary | ICD-10-CM | POA: Diagnosis not present

## 2023-01-22 DIAGNOSIS — R300 Dysuria: Secondary | ICD-10-CM | POA: Insufficient documentation

## 2023-01-22 DIAGNOSIS — D500 Iron deficiency anemia secondary to blood loss (chronic): Secondary | ICD-10-CM

## 2023-01-22 LAB — POCT URINALYSIS DIPSTICK
Bilirubin, UA: NEGATIVE
Glucose, UA: POSITIVE — AB
Ketones, UA: NEGATIVE
Leukocytes, UA: NEGATIVE
Nitrite, UA: NEGATIVE
Protein, UA: NEGATIVE
Spec Grav, UA: 1.01 (ref 1.010–1.025)
Urobilinogen, UA: 0.2 U/dL
pH, UA: 5 (ref 5.0–8.0)

## 2023-01-22 MED ORDER — TRAZODONE HCL 50 MG PO TABS: 25.0000 mg | ORAL_TABLET | Freq: Every evening | ORAL | 0 refills | Status: DC | PRN

## 2023-01-22 MED ORDER — PANTOPRAZOLE SODIUM 40 MG PO TBEC: 40.0000 mg | DELAYED_RELEASE_TABLET | Freq: Every day | ORAL | 0 refills | Status: DC

## 2023-01-22 MED ORDER — DM-GUAIFENESIN ER 60-1200 MG PO TB12: 1.0000 | ORAL_TABLET | Freq: Two times a day (BID) | ORAL | 0 refills | Status: AC

## 2023-01-22 MED ORDER — MIDODRINE HCL 10 MG PO TABS: 10.0000 mg | ORAL_TABLET | Freq: Two times a day (BID) | ORAL | 0 refills | Status: DC

## 2023-01-22 NOTE — Progress Notes (Unsigned)
[facility]   MULTIDISCIPLINARY HEART VALVE TEAM  Structural Heart Office Note:  .    Date:  01/23/2023  ID:  Dana Horton, DOB 23-Mar-1946, MRN [id number] PCP: Crist Fat, MD  [facility] Providers Cardiologist:  Norman Herrlich, MD/ Dr. Lynnette Caffey, MD (TEER)  History of Present Illness: .   Dana Horton is a 77 y.o. female with a history of uncontrolled atrial fibrillation on Eliquis, HFpEF, HTN, current tobacco use with COPD, OSA on CPAP, CKD stage IIIa, hx of bladder cancer, HLD, GERD, iron deficiency anemia, and newly dx severe mitral regurgitation/tricuspid regurgitation/aortic regurgitation s/p mTEER who is here for TOC follow up.    [name] has a long hx of uncontrolled atrial fibrillation. She was initially seen by cardiology in 2018 foro AF RVR. Echocardiogram at that time only showed mild MR/TR. LHC with non-obstructive CAD. She was treated for atrial fibrillation and was last to follow up until 07/2022. ZIO at that time showed low AF burden after being placed on Flecainide.    More recently she was hospitalized with acute respiratory distress, acute on chronic diastolic CHF, and AF with RVR. Cardiology was consulted. Echocardiogram showed normal LVEF at 65-70% with GIII DD with evidence of severe MR and moderate TR. 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 Also noted to have moderate grade III plaquing of the aorta and severely dilated pulmonary artery. R/LHC performed 01/02/2023 that showed normal coronary vessels. She was seen by the structural heart team in consultation with plans for mTEER after discharge. CT chest was performed in the setting of unintentional weight loss with long hx of tobacco use was performed that showed multiple incidental findings that include enlarged mediastinal and bilateral hilar lymph nodes, new RUL 4mm  solid pulmonary nodule, 2cm left kidney lesion concerning for renal neoplasm with recommendations for further evaluation with renal protocol MRI or CT.    The patient was evaluated by the multidisciplinary valve team and felt to have severe, symptomatic mitral regurgitation and to be a suitable candidate and is now s/p successful transcatheter edge to edge repair of the mitral valve with a MitraClip NT device positioned A3/P3, reducing MR from 4+ down to 1+.  She was resumed on Eliquis 2 hours post procedure given AF with RVR s/p DCCV during mTEER. IV Amiodarone initiated however has been having trouble with nausea with home PO dose of 200mg . Echo prior to discharge with stable MitraClip NT placement in the A3/P3 position.   Today she is here with her daughter and reports that she has been well since discharge. Her breathing has improved and she is now able to do activities without early fatigue. She has smoked a few cigarettes since discharge but knows this is not what she needs to be doing. Groin site stable. She denies chest pain, SOB, palpitations, LE edema, orthopnea, PND, dizziness, or syncope. Denies bleeding in stool or urine.    Physical Exam:    VS:  BP (!) 102/58   Pulse 63   Ht 5\' 3"  (1.6 m)   Wt 121 lb 3.2 oz (55 kg)   LMP  (LMP Unknown)   SpO2 99%   BMI 21.47 kg/m    Wt Readings from Last 3 Encounters:  01/23/23 121 lb 3.2 oz (55 kg)  01/22/23 121 lb 1 oz (54.9 kg)  01/16/23 120 lb (54.4 kg)    General: Well developed,  well nourished, NAD Lungs:Clear to ausculation bilaterally. No wheezes, rales, or rhonchi. Breathing is unlabored. Cardiovascular: RRR with S1 S2. Soft murmur Extremities: No edema. Groin stable.  Neuro: Alert and oriented. No focal deficits. No facial asymmetry. MAE spontaneously. Psych: Responds to questions appropriately with normal affect.    ASSESSMENT AND PLAN: .    Severe mitral regurgitation: Patient doing well with NYHA class I symptoms s/p  successful mTEER with a MitraClip NT device positioned A3/P3, reducing MR from 4+ down to 1+. Continue Eliquis monotherapy.  Echo with stable MitraClip NT placement in the A3/P3 position. No indication for SBE as she has full dentures. Plan one month follow up with echo.    Persistent atrial fibrillation: Patient has a long hx of previously uncontrolled atrial fibrillation on previously on flecainide. Noted to have AF w/ RVR during procedure and is now s/p DCCV to NSR treated with IV Amiodarone. Patient not tolerating Amiodarone 200mg  daily at home secondary to severe nausea and was transitioned to PO Amio 100mg  daily. Nausea has improved since discharge and EKG today with NSR. Plan to cancel AF Clinic follow up and continue Amio 100mg  daily.    Acute post-op anemia: Seen by PCP yesterday with stable HB at 9.8. Continue to follow with Dr. Kathrynn Running (for bladder tumors known to bleed and PCP for iron deficiency anemia) who watch this closely.    HFpEF: Appears euvolemic on exam today.  Resumed on home Lasix. If weight stable and no evidence of HF at follow up, consider reducing to 20mg  daily.    Ongoing tobacco use with COPD: Home inhalers restarted post op. Incidental finding of pulmonary nodule requiring follow up CT as below. Has reduced tobacco use but not eliminated.    CKD stage IIIa: Cr yesterday at PCP at 1.38, stable    HLD: Continue Crestor. Last LDL from 05/21/22 at 110    HTN: Soft but asymptomatic. No changes needed in therapy    Incidental findings: Pre TEER chest CT with new solid pulmonary nodule of the right upper lobe measuring 4mm. Recommend non-contrast chest CT in 12 months. Lesion of the posterior left kidney at 2cm concerning for renal neoplasm with recommendations for non-emergent renal protocol MRI or CT. Plan will be to consolidate both non-contrast chest CT and renal protocol CT to be scheduled when the patient is in [place] for her one month follow up with our team as she  lives in [place], [place].   Signed, Georgie Chard, NP

## 2023-01-22 NOTE — Progress Notes (Signed)
Rx Refill

## 2023-01-22 NOTE — Patient Instructions (Addendum)
Please get your Pneumococcal Polysaccharide-23 updated at your local pharmacy.   Drink plenty of fluids especially while using Mucinex DM for cough. Continue using inhaler and nebulizer for wheezing or shortness of breath.

## 2023-01-22 NOTE — Assessment & Plan Note (Signed)
I have sent in an expectorant to help with mucous production. Due to history of PNA I do encourage her to update her pneumococcal vaccination. She will also [place] Symbicort and Xopenex.

## 2023-01-22 NOTE — Progress Notes (Signed)
Established Patient Office Visit  Subjective   Patient ID: Dana Horton, female    DOB: [birth year]  Age: 77 y.o. MRN: [id number]  Chief Complaint  Patient presents with   Hospitalization Follow-up    Was in Hospital for Tachycardia. Too frail for open heart surgery so they did a clamp. They said 2 valves in her heart were bad.  She went into Afib on the table. But they were able to get 2 clamps in.    HPI  Dana Horton is a 77 year old female who presents for follow up of hospital admission for severe mitral regurgitation. She was admitted on 9/30/20214 and discharged on 01/16/2023. Medical history of uncontrolled atrial fibrillation on Eliquis, HFpEF, HTN, current tobacco use with COPD, OSA on CPAP, CKD stage IIIa, hx of bladder cancer, HLD, GERD, iron deficiency anemia, and newly dx severe mitral regurgitation/tricuspid regurgitation/aortic regurgitation, s/p successful transcatheter edge to edge repair of the mitral valve with a MitraClip NT device positioned A3/P3, reducing MR from 4+ down to 1+.    Per Chart Review by Dr. Excell Seltzer: Echocardiogram showed normal LVEF at 65-70% with GIII DD with evidence of severe MR and moderate TR. 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 Also noted to have moderate grade III plaquing of the aorta and severely dilated pulmonary artery. R/LHC performed 01/02/2023 that showed normal coronary vessels. She was seen by the structural heart team in consultation with plans for mTEER after discharge.  CT chest was performed in the setting of unintentional weight loss with long hx of tobacco use was performed that showed multiple incidental findings that include enlarged mediastinal and bilateral hilar lymph nodes, new RUL 4mm solid pulmonary nodule, 2cm left kidney lesion concerning for renal neoplasm with recommendations for further evaluation with  renal protocol MRI or CT.   The patient was evaluated by the multidisciplinary valve team and felt to have severe, symptomatic mitral regurgitation and to be a suitable candidate for TEER, which was set up for 01/16/2023.  s/p successful transcatheter edge to edge repair of the mitral valve with a MitraClip NT device positioned.   Today the patient reports feeling "pretty good overall", she reports some residual weakness from being in the hospital, she has lost around 9 lbs. She tells me that she was started on 200 mg but dose was decreased due to nausea and anorexia, amiodarone was decreased to 100 mg in which she is tolerating better. Flecainide has been discontinued.  She has a follow up with cardiac thoracic surgeon on tomorrow 10/30/204. There is significant bruising to the anterior right arm with tenderness from surgical interventions.  She reports coughing up yellow mucous and has a runny nose, she denies fevers, or shortness of breath. It was suggested that she receive pneumonia vaccination, her last of Pneumococcal Polysaccharide-23 was 12/2016.  Using inhalers xopenex and Symbicort for cough, no OTC meds.  In addition to cough, she also reports burning with urination for 3 days, there is no discharge, or odor, no flank pain or hematuria.   Chronic Anemia: the patient has chronic anemia, during hospital admission hemoglobin was 6.9 after 1 unit PRBC hemoglobin increased to 8.6. She denies any rectal bleeding, hematuria, or hemoptysis. She continues using Accufer.    Review of Systems  Constitutional: Negative.   HENT:  Positive for congestion.   Eyes: Negative.   Respiratory:  Positive for cough and sputum production.  Negative for shortness of breath.   Cardiovascular: Negative.   Gastrointestinal: Negative.   Genitourinary:  Positive for dysuria, frequency and urgency. Negative for flank pain and hematuria.  Musculoskeletal: Negative.   Skin: Negative.   Neurological:  Positive for  weakness.  Endo/Heme/Allergies: Negative.   Psychiatric/Behavioral: Negative.        Objective:     BP (!) 118/52 (BP Location: Left Arm, Patient Position: Sitting)   Pulse 64   Temp (!) 97.5 F (36.4 C)   Resp 16   Ht 5\' 3"  (1.6 m)   Wt 121 lb 1 oz (54.9 kg)   LMP  (LMP Unknown)   SpO2 97%   BMI 21.45 kg/m    Physical Exam Vitals reviewed.  Constitutional:      Appearance: Normal appearance.  Cardiovascular:     Rate and Rhythm: Normal rate and regular rhythm.     Pulses: Normal pulses.     Heart sounds: Normal heart sounds.  Pulmonary:     Effort: Pulmonary effort is normal. No accessory muscle usage or respiratory distress.     Breath sounds: Decreased air movement present. Examination of the right-lower field reveals rhonchi. Examination of the left-lower field reveals rhonchi. Rhonchi present. No decreased breath sounds, wheezing or rales.  Abdominal:     General: Abdomen is flat. Bowel sounds are normal. There is no distension.     Palpations: Abdomen is soft.  Musculoskeletal:        General: Normal range of motion.     Right lower leg: No edema.     Left lower leg: No edema.  Skin:    General: Skin is warm and dry.     Capillary Refill: Capillary refill takes less than 2 seconds.     Coloration: Skin is pale.     Findings: Bruising present.  Neurological:     Mental Status: She is alert and oriented to person, place, and time. Mental status is at baseline.     Cranial Nerves: No cranial nerve deficit.     Sensory: No sensory deficit.     Motor: Weakness present.     Gait: Gait normal.  Psychiatric:        Mood and Affect: Mood normal.        Behavior: Behavior normal.      Results for orders placed or performed in visit on 01/22/23  POCT urinalysis dipstick  Result Value Ref Range   Color, UA Yellow    Clarity, UA Clear    Glucose, UA Positive (A) Negative   Bilirubin, UA Negative    Ketones, UA Negative    Spec Grav, UA 1.010 1.010 - 1.025    Blood, UA Trace-Intact    pH, UA 5.0 5.0 - 8.0   Protein, UA Negative Negative   Urobilinogen, UA 0.2 0.2 or 1.0 E.U./dL   Nitrite, UA Negative    Leukocytes, UA Negative Negative   Appearance     Odor        The ASCVD Risk score (Arnett DK, et al., 2019) failed to calculate for the following reasons:   The patient has a prior MI or stroke diagnosis    Assessment & Plan:   Problem List Items Addressed This Visit     Iron deficiency anemia due to chronic blood loss    CBC with platelets collected.       Relevant Orders   CBC with Differential/Platelet   CMP14 + Anion Gap   S/P mitral valve clip  implantation - Primary    Patient is status post transcatheter edge to edge mitral valve repair. She appear to be stable at this time. She will follow up for post op visit with Dr. Copper on 01/23/20. Blood work collected cbc/cmp.       Relevant Orders   CMP14 + Anion Gap   Dysuria    She reports dysuria and frequency, dipstick was negative. She will watch and wait and increase hydration.        Relevant Orders   POCT urinalysis dipstick (Completed)   Subacute cough    I have sent in an expectorant to help with mucous production. Due to history of PNA I do encourage her to update her pneumococcal vaccination. She will also [place] Symbicort and Xopenex.       Relevant Medications   Dextromethorphan-Guaifenesin 60-1200 MG 12hr tablet    Return in about 3 months (around 04/24/2023) for follow up .    Edwena Blow, NP

## 2023-01-22 NOTE — Assessment & Plan Note (Signed)
She reports dysuria and frequency, dipstick was negative. She will watch and wait and increase hydration.

## 2023-01-22 NOTE — Assessment & Plan Note (Signed)
CBC with platelets collected.

## 2023-01-22 NOTE — Telephone Encounter (Signed)
Per phase 1 Cardiac Rehab fax referral to [facility]

## 2023-01-22 NOTE — Assessment & Plan Note (Signed)
Patient is status post transcatheter edge to edge mitral valve repair. She appear to be stable at this time. She will follow up for post op visit with Dr. Copper on 01/23/20. Blood work collected cbc/cmp.

## 2023-01-23 ENCOUNTER — Ambulatory Visit: Payer: Medicare Other | Attending: Cardiology | Admitting: Cardiology

## 2023-01-23 VITALS — BP 102/58 | HR 63 | Ht 63.0 in | Wt 121.2 lb

## 2023-01-23 DIAGNOSIS — Z95818 Presence of other cardiac implants and grafts: Secondary | ICD-10-CM | POA: Insufficient documentation

## 2023-01-23 DIAGNOSIS — I340 Nonrheumatic mitral (valve) insufficiency: Secondary | ICD-10-CM | POA: Insufficient documentation

## 2023-01-23 DIAGNOSIS — I480 Paroxysmal atrial fibrillation: Secondary | ICD-10-CM | POA: Insufficient documentation

## 2023-01-23 DIAGNOSIS — Z720 Tobacco use: Secondary | ICD-10-CM | POA: Diagnosis not present

## 2023-01-23 DIAGNOSIS — N1831 Chronic kidney disease, stage 3a: Secondary | ICD-10-CM | POA: Diagnosis not present

## 2023-01-23 DIAGNOSIS — Z09 Encounter for follow-up examination after completed treatment for conditions other than malignant neoplasm: Secondary | ICD-10-CM | POA: Insufficient documentation

## 2023-01-23 DIAGNOSIS — N289 Disorder of kidney and ureter, unspecified: Secondary | ICD-10-CM | POA: Diagnosis not present

## 2023-01-23 DIAGNOSIS — R911 Solitary pulmonary nodule: Secondary | ICD-10-CM | POA: Insufficient documentation

## 2023-01-23 DIAGNOSIS — Z9889 Other specified postprocedural states: Secondary | ICD-10-CM | POA: Insufficient documentation

## 2023-01-23 DIAGNOSIS — Z7901 Long term (current) use of anticoagulants: Secondary | ICD-10-CM | POA: Diagnosis not present

## 2023-01-23 LAB — CBC WITH DIFFERENTIAL/PLATELET
Basophils Absolute: 0.1 10*3/uL (ref 0.0–0.2)
Basos: 0 %
EOS (ABSOLUTE): 0.1 10*3/uL (ref 0.0–0.4)
Eos: 1 %
Hematocrit: 31.7 % — ABNORMAL LOW (ref 34.0–46.6)
Hemoglobin: 9.8 g/dL — ABNORMAL LOW (ref 11.1–15.9)
Immature Grans (Abs): 0.1 10*3/uL (ref 0.0–0.1)
Immature Granulocytes: 1 %
Lymphocytes Absolute: 2 10*3/uL (ref 0.7–3.1)
Lymphs: 16 %
MCH: 29.5 pg (ref 26.6–33.0)
MCHC: 30.9 g/dL — ABNORMAL LOW (ref 31.5–35.7)
MCV: 96 fL (ref 79–97)
Monocytes Absolute: 0.7 10*3/uL (ref 0.1–0.9)
Monocytes: 5 %
Neutrophils Absolute: 9.4 10*3/uL — ABNORMAL HIGH (ref 1.4–7.0)
Neutrophils: 77 %
Platelets: 233 10*3/uL (ref 150–450)
RBC: 3.32 x10E6/uL — ABNORMAL LOW (ref 3.77–5.28)
RDW: 16.9 % — ABNORMAL HIGH (ref 11.7–15.4)
WBC: 12.3 10*3/uL — ABNORMAL HIGH (ref 3.4–10.8)

## 2023-01-23 LAB — CMP14 + ANION GAP
ALT: 6 [IU]/L (ref 0–32)
AST: 13 [IU]/L (ref 0–40)
Albumin: 3.7 g/dL — ABNORMAL LOW (ref 3.8–4.8)
Alkaline Phosphatase: 81 [IU]/L (ref 44–121)
Anion Gap: 18 mmol/L (ref 10.0–18.0)
BUN/Creatinine Ratio: 17 (ref 12–28)
BUN: 24 mg/dL (ref 8–27)
Bilirubin Total: 0.8 mg/dL (ref 0.0–1.2)
CO2: 24 mmol/L (ref 20–29)
Calcium: 9.5 mg/dL (ref 8.7–10.3)
Chloride: 101 mmol/L (ref 96–106)
Creatinine, Ser: 1.38 mg/dL — ABNORMAL HIGH (ref 0.57–1.00)
Globulin, Total: 3.3 g/dL (ref 1.5–4.5)
Glucose: 84 mg/dL (ref 70–99)
Potassium: 4.6 mmol/L (ref 3.5–5.2)
Sodium: 143 mmol/L (ref 134–144)
Total Protein: 7 g/dL (ref 6.0–8.5)
eGFR: 40 mL/min/{1.73_m2} — ABNORMAL LOW (ref >59)

## 2023-01-23 NOTE — Patient Instructions (Signed)
Medication Instructions:   *If you need a refill on your cardiac medications before your next appointment, please call your pharmacy*   Lab Work:  If you have labs (blood work) drawn today and your tests are completely normal, you will receive your results only by: MyChart Message (if you have MyChart) OR A paper copy in the mail If you have any lab test that is abnormal or we need to change your treatment, we will call you to review the results.   Testing/Procedures:    Follow-Up: At [facility], you and your health needs are our priority.  As part of our continuing mission to provide you with exceptional heart care, we have created designated Provider Care Teams.  These Care Teams include your primary Cardiologist (physician) and Advanced Practice Providers (APPs -  Physician Assistants and Nurse Practitioners) who all work together to provide you with the care you need, when you need it.  We recommend signing up for the patient portal called "MyChart".  Sign up information is provided on this After Visit Summary.  MyChart is used to connect with patients for Virtual Visits (Telemedicine).  Patients are able to view lab/test results, encounter notes, upcoming appointments, etc.  Non-urgent messages can be sent to your provider as well.   To learn more about what you can do with MyChart, go to [email or web address].    

## 2023-01-24 NOTE — Progress Notes (Signed)
Leak, Luetta Nutting, NP  Christianne Dolin, CMA I have reviewed the results of your recent test and lab work that you underwent at our clinic. Findings indicate labs that are stable. Continue with medication changes that were discussed at your cardiology appointment yesterday.   Pt. Informed of results and recommendations.

## 2023-01-24 NOTE — Progress Notes (Signed)
I have reviewed the results of your recent test and lab work that you underwent at our clinic. Findings indicate labs that are stable. Continue with medication changes that were discussed at your cardiology appointment yesterday.

## 2023-01-28 ENCOUNTER — Ambulatory Visit (HOSPITAL_COMMUNITY): Payer: Medicare Other | Admitting: Physician Assistant

## 2023-01-31 DIAGNOSIS — N289 Disorder of kidney and ureter, unspecified: Secondary | ICD-10-CM | POA: Insufficient documentation

## 2023-01-31 NOTE — Assessment & Plan Note (Addendum)
CT from Oct 2024:  Lesion of the posterior left kidney measuring 2.0 cm, concerning for renal neoplasm.  Already has a Ct later in Nov. Will follow up with me after imaging

## 2023-01-31 NOTE — Progress Notes (Signed)
[facility] CONSULT NOTE  Patient Care Team: Crist Fat, MD as PCP - General (Internal Medicine) Dulce Sellar Iline Oven, MD as PCP - Cardiology (Cardiology)  ASSESSMENT & PLAN:  Dana Horton is a 77 y.o.female with history of A-fib, CKD, COPD, diastolic heart failure, OSA on CPAP, hypertension and NMIBC being [facility] for Urothelial carcinoma and anemia.  Lung nodule From CT in Oct: New solid pulmonary nodule of the right upper lobe measuring 4 mm  This is very small and cannot be biopsied. Too small for PET to be useful. Follow up with CT  Kidney lesion, native, left CT from Oct 2024:  Lesion of the posterior left kidney measuring 2.0 cm, concerning for renal neoplasm.   Normocytic anemia In October, elevated ferritin. Normal folate.  Borderline low normal B12. Procedure on 10/23 and hgb dropped on 10/24.  Repeat ferritin, B12  Her heart rate decreased throughout the visit. No signs of RVR and dizziness. Recommend report to ED if increase palpitation, heart rate, dizziness, lightheadedness, chest discomfort and short of breath.  Follow up with me 1-2 weeks after her upcoming Ct scan  Orders Placed This Encounter  Procedures   CBC with Differential (Cancer Center Only)    Standing Status:   Future    Number of Occurrences:   1    Standing Expiration Date:   02/04/2024   Lactate dehydrogenase    Standing Status:   Future    Number of Occurrences:   1    Standing Expiration Date:   02/04/2024   Iron and Iron Binding Capacity ([facility] only)    Standing Status:   Future    Number of Occurrences:   1    Standing Expiration Date:   02/04/2024   Ferritin    Standing Status:   Future    Number of Occurrences:   1    Standing Expiration Date:   02/04/2024   Folate    Standing Status:   Future    Number of Occurrences:   1    Standing Expiration Date:   02/04/2024   Copper, serum    Standing Status:   Future    Number of Occurrences:   1     Standing Expiration Date:   02/04/2024   Vitamin B12    Standing Status:   Future    Number of Occurrences:   1    Standing Expiration Date:   02/04/2024   Basic Metabolic Panel - Cancer Center Only    Standing Status:   Future    Number of Occurrences:   1    Standing Expiration Date:   02/04/2024   Hepatic function panel    Standing Status:   Future    Number of Occurrences:   1    Standing Expiration Date:   02/04/2024   Sample to Blood Bank    Standing Status:   Future    Number of Occurrences:   1    Standing Expiration Date:   02/04/2024     All questions were answered. The patient knows to call the clinic with any problems, questions or concerns. No barriers to learning was detected.  Melven Sartorius, MD 11/11/20245:13 PM  CHIEF COMPLAINTS/PURPOSE OF CONSULTATION:  Anemia, renal lesion, bladder cancer  HISTORY OF PRESENTING ILLNESS:  Dana Horton 77 y.o. female report decreased appetite and unexpected weight loss. She did not know she had valvular issue until recently. She feels palpitation for when going  into afib. It has become more frequently uncontrolled about 3 months. She required transfusion a few time in the last few months. She is on apixaban twice daily. No bloody stool but dark with iron. No bloody urine or vaginal bleeding. No bleeding.   She felt better after MV repair for a while but now more fatigue. She also has aortic and tricuspid valve disorder.  She has been taking iron daily in the past year. She does not take b12 or folate or multivitamin. She does not have good appetite.  She saw Dr. Berneice Heinrich for BCG with last in Oct. It was her 4th weekly doses. No hematuria since then. No difficulty urinating. No abdominal pain but nausea. Report right lower flank pain if over exert. No headache.   I have reviewed her chart and materials related to her cancer extensively and collaborated history with the patient. Summary of oncologic history is as  follows:  Brother had bladder cancer in 101's. Maternal aunt had kidney cancer in 50's.  Oncology History  Urothelial carcinoma of bladder (HCC)  08/06/2022 Initial Diagnosis   Urothelial carcinoma of bladder (HCC)   08/09/2022 Pathology Results   A. BLADDER, BIOPSY:  High grade papillary urothelial carcinoma  The carcinoma invades laminar propria  Muscularis propria is present and not involved by carcinoma   B. BLADDER, BASE, BIOPSY:  Small fragment of non-invasive high grade papillary urothelial carcinoma  Submucosa and muscularis propria (detrusor muscle)  Negative for invasive carcinoma    10/05/2022 Pathology Results   A. BLADDER, OLD RESECTION SITE, TURBT:  Foci of Urothelial carcinoma in situ  Muscularis propria (detrusor muscle) is present and not involved by carcinoma   B. BLADDER, BASE OF OLD RESECTION SITE, BIOPSY:  Benign fibromuscular tissue with dystrophic calcification    01/03/2023 Imaging   CT Chest 1. Diffuse ground-glass opacities and septal thickening, consistent with pulmonary edema. 2. Moderate bilateral pleural effusions, loculated on the right. 3. Enlarged mediastinal and bilateral hilar lymph nodes, likely reactive. 4. New solid pulmonary nodule of the right upper lobe measuring 4 mm. Consider Non-contrast chest CT can be considered in 12 months. This recommendation follows the consensus statement: Guidelines for Management of Incidental Pulmonary Nodules Detected on CT Images: From the Fleischner Society 2017; Radiology 2017; 284:228-243. 5. Lesion of the posterior left kidney measuring 2.0 cm, concerning for renal neoplasm. Recommend nonemergent renal protocol MRI or CT for further evaluation. 6. Aortic Atherosclerosis (ICD10-I70.0) and Emphysema (ICD10-J43.9).     MEDICAL HISTORY:  Past Medical History:  Diagnosis Date   Atrial fibrillation with rapid ventricular response (HCC) 04/10/2017   Bladder tumor 08/06/2022   BMI 28.0-28.9,adult 05/21/2022    Bronchitis 03/07/2022   CHF (congestive heart failure) (HCC)    Chronic anticoagulation 06/10/2019   Eliquis   Chronic diastolic heart failure (HCC)    Chronic obstructive pulmonary disease (HCC) 05/21/2022   Cigarette smoker 05/21/2022   CKD (chronic kidney disease) stage 3, GFR 30-59 ml/min (HCC)    Demand ischemia (HCC) 09/25/2016   Normal coronaries Jan 2019 when admitted with AF with RVR and chest pain   Dysrhythmia    a-fib   Essential hypertension    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 06/29/2022   GERD (gastroesophageal reflux disease)    HCAP (healthcare-associated pneumonia) 04/10/2017   Headache    Heart murmur    History of bladder cancer    Hypercholesterolemia 05/21/2022   Iron deficiency anemia due to chronic blood loss 06/29/2022   Lung nodule  Myocardial infarction (HCC) 04/07/2017   NSTEMI   OSA on CPAP 04/08/2017   Osteopenia 05/21/2022   Osteoporosis    PAF (paroxysmal atrial fibrillation) (HCC) 09/24/2016   CHADS2 vasc=3   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   S/p successful transcatheter edge-to-edge mitral valve repair with one Mitral Clip NT positioned A3/P3 position   Sleep apnea    cpap  setting at 2.5 per patient    Stage 3a chronic kidney disease (HCC) 05/21/2022   Symptomatic anemia 08/07/2022   Tobacco abuse     SURGICAL HISTORY: Past Surgical History:  Procedure Laterality Date   ABDOMINAL HYSTERECTOMY     BLADDER SURGERY     COLONOSCOPY N/A 12/28/2016   Procedure: COLONOSCOPY;  Surgeon: Sherrilyn Rist, MD;  Location: [facility];  Service: Gastroenterology;  Laterality: N/A;   LEFT HEART CATH AND CORONARY ANGIOGRAPHY N/A 04/09/2017   Procedure: LEFT HEART CATH AND CORONARY ANGIOGRAPHY;  Surgeon: Corky Crafts, MD;  Location: [facility] INVASIVE CV LAB;  Service: Cardiovascular;  Laterality: N/A;   MOUTH SURGERY     POLYPECTOMY N/A 12/28/2016   Procedure: POLYPECTOMY with injection of  Elevue;  Surgeon: Sherrilyn Rist, MD;  Location: [facility];  Service: Gastroenterology;  Laterality: N/A;   RIGHT/LEFT HEART CATH AND CORONARY ANGIOGRAPHY N/A 01/02/2023   Procedure: RIGHT/LEFT HEART CATH AND CORONARY ANGIOGRAPHY;  Surgeon: Iran Ouch, MD;  Location: [facility] INVASIVE CV LAB;  Service: Cardiovascular;  Laterality: N/A;   TEE WITHOUT CARDIOVERSION N/A 01/01/2023   Procedure: TRANSESOPHAGEAL ECHOCARDIOGRAM;  Surgeon: Jodelle Red, MD;  Location[facility] INVASIVE CV LAB;  Service: Cardiovascular;  Laterality: N/A;   TRANSCATHETER MITRAL EDGE TO EDGE REPAIR N/A 01/16/2023   Procedure: TRANSCATHETER MITRAL EDGE TO EDGE REPAIR;  Surgeon: Tonny Bollman, MD;  Location: [facility] INVASIVE CV LAB;  Service: Cardiovascular;  Laterality: N/A;   TRANSURETHRAL RESECTION OF BLADDER TUMOR N/A 08/09/2022   Procedure: TRANSURETHRAL RESECTION OF BLADDER TUMOR (TURBT) BILATERAL RETROGRADES;  Surgeon: Loletta Parish., MD;  Location: [facility];  Service: Urology;  Laterality: N/A;   TRANSURETHRAL RESECTION OF BLADDER TUMOR N/A 10/05/2022   Procedure: RESTAGING TRANSURETHRAL RESECTION OF BLADDER TUMOR (TURBT), CYSTOSCOPY, BILATERAL RETROGRADE PYELOGRAM;  Surgeon: Loletta Parish., MD;  Location: [facility];  Service: Urology;  Laterality: N/A;  1 HR    SOCIAL HISTORY: Social History   Socioeconomic History   Marital status: Married    Spouse name: Not on file   Number of children: 3   Years of education: Not on file   Highest education level: Not on file  Occupational History   Occupation: Retired  Tobacco Use   Smoking status: Every Day    Current packs/day: 0.50    Average packs/day: 0.5 packs/day for 52.0 years (26.0 ttl pk-yrs)    Types: Cigarettes   Smokeless tobacco: Never   Tobacco comments:    1/2 pack a day  Vaping Use   Vaping status: Never Used  Substance and Sexual Activity   Alcohol use: No   Drug use: No   Sexual activity: Not Currently  Other Topics Concern    Not on file  Social History Narrative   Not on file   Social Determinants of Health   Financial Resource Strain: Not on file  Food Insecurity: No Food Insecurity (01/16/2023)   Hunger Vital Sign    Worried About Running Out of Food in the Last Year: Never true    Ran Out of Food in the Last  Year: Never true  Transportation Needs: No Transportation Needs (01/16/2023)   PRAPARE - Administrator, Civil Service (Medical): No    Lack of Transportation (Non-Medical): No  Physical Activity: Not on file  Stress: Not on file  Social Connections: Not on file  Intimate Partner Violence: Not At Risk (01/16/2023)   Humiliation, Afraid, Rape, and Kick questionnaire    Fear of Current or Ex-Partner: No    Emotionally Abused: No    Physically Abused: No    Sexually Abused: No    FAMILY HISTORY: Family History  Problem Relation Age of Onset   Hypertension Mother    Stroke Mother    Cancer Father    Heart disease Father    CAD Father    Arrhythmia Father    Bladder Cancer Brother    Lung cancer Brother    Colon cancer Neg Hx    Esophageal cancer Neg Hx    Stomach cancer Neg Hx    Colon polyps Neg Hx     ALLERGIES:  is allergic to albuterol and lisinopril.  MEDICATIONS:  Current Outpatient Medications  Medication Sig Dispense Refill   acetaminophen (TYLENOL) 500 MG tablet Take 500 mg by mouth every 6 (six) hours as needed for moderate pain or headache.     amiodarone (PACERONE) 100 MG tablet Take 1 tablet (100 mg total) by mouth daily. 60 tablet 2   apixaban (ELIQUIS) 5 MG TABS tablet Take 1 tablet (5 mg total) by mouth 2 (two) times daily. 180 tablet 3   docusate sodium (COLACE) 100 MG capsule Take 100 mg by mouth daily.     empagliflozin (JARDIANCE) 10 MG TABS tablet Take 1 tablet (10 mg total) by mouth daily. 30 tablet 0   Ferric Maltol 30 MG CAPS Take 1 capsule (30 mg total) by mouth daily. 90 capsule 1   fluticasone (FLONASE) 50 MCG/ACT nasal spray Place 2 sprays  into both nostrils daily. 15.8 mL 0   furosemide (LASIX) 40 MG tablet Take 1 tablet (40 mg total) by mouth daily. 30 tablet 0   levalbuterol (XOPENEX HFA) 45 MCG/ACT inhaler Inhale 1-2 puffs into the lungs every 4 (four) hours as needed for wheezing. (Patient taking differently: Inhale 1-2 puffs into the lungs 2 (two) times daily.) 1 each 12   metoprolol tartrate (LOPRESSOR) 25 MG tablet Take 0.5 tablets (12.5 mg total) by mouth 2 (two) times daily. 60 tablet 0   midodrine (PROAMATINE) 10 MG tablet Take 1 tablet (10 mg total) by mouth 2 (two) times daily with a meal. 60 tablet 0   montelukast (SINGULAIR) 10 MG tablet Take 1 tablet (10 mg total) by mouth every evening. 90 tablet 1   ondansetron (ZOFRAN) 4 MG tablet Take 1 tablet (4 mg total) by mouth daily as needed for nausea or vomiting. 30 tablet 1   pantoprazole (PROTONIX) 40 MG tablet Take 1 tablet (40 mg total) by mouth daily. 90 tablet 0   polyethylene glycol (MIRALAX / GLYCOLAX) 17 g packet Take 17 g by mouth daily as needed. 14 each 0   potassium chloride SA (KLOR-CON M) 20 MEQ tablet Take 1 tablet (20 mEq total) by mouth daily. 30 tablet 0   pregabalin (LYRICA) 50 MG capsule TAKE ONE CAPSULE BY MOUTH TWICE DAILY 60 capsule 0   rosuvastatin (CRESTOR) 5 MG tablet Take 5 mg by mouth every other day. In the morning     SYMBICORT 160-4.5 MCG/ACT inhaler Inhale 2 puffs into the lungs 2 (  two) times daily. 1 each 3   traZODone (DESYREL) 50 MG tablet Take 0.5 tablets (25 mg total) by mouth at bedtime as needed for sleep. 30 tablet 0   Current Facility-Administered Medications  Medication Dose Route Frequency Provider Last Rate Last Admin   ipratropium-albuterol (DUONEB) 0.5-2.5 (3) MG/3ML nebulizer solution 3 mL  3 mL Nebulization Once Crist Fat, MD        REVIEW OF SYSTEMS:   Constitutional: Denies fevers, weight loss or abnormal night sweats Eyes: Denies blurriness of vision, double vision Mouth: Denies mucositis or sore  throat Respiratory: Denies cough, shortness of breath or wheezes Cardiovascular: Denies palpitation, chest discomfort or lower extremity swelling Gastrointestinal:  Denies nausea, vomiting, abdominal pain, diarrhea, constipation or change in bowel habits GU: Denies any dysuria, hematuria, hesitancy Skin: Denies abnormal skin rashes Lymphatics: Denies new lymphadenopathy or easy bruising Neurological: Denies numbness, tingling or new weaknesses Behavioral/Psych: Mood is stable, no new changes  All other systems were reviewed with the patient and are negative.  PHYSICAL EXAMINATION: ECOG PERFORMANCE STATUS: 3 - Symptomatic, >50% confined to bed  Vitals:   02/04/23 1327  BP: (!) 92/42  Pulse: (!) 125  Resp: 18  Temp: (!) 97.5 F (36.4 C)  SpO2: 99%   Filed Weights   02/04/23 1327  Weight: 121 lb (54.9 kg)    GENERAL: alert, no distress and comfortable SKIN: skin color is normal EYES: sclera clear. Conjunctiva pale OROPHARYNX: no exudate NECK: supple LYMPH:  no palpable lymphadenopathy in the cervical, axillary regions LUNGS: Effort normal, no respiratory distress. Bilateral wheezes HEART: irregularly irregular ABDOMEN: soft, non-tender and nondistended Musculoskeletal: no point tenderness NEURO: no focal motor/sensory deficits  LABORATORY DATA:  I have reviewed the data as listed Lab Results  Component Value Date   WBC 8.2 02/04/2023   HGB 8.6 (L) 02/04/2023   HCT 28.0 (L) 02/04/2023   MCV 95.6 02/04/2023   PLT 147 (L) 02/04/2023   Recent Labs    12/24/22 1701 12/25/22 0308 01/06/23 0356 01/16/23 0655 01/16/23 0950 01/17/23 0221 01/22/23 1206 02/04/23 1420  NA 139   < > 138 139   < > 137 143 137  K 3.5   < > 3.8 4.0   < > 4.1 4.6 3.7  CL 105   < > 98 101  --  103 101 102  CO2 25   < > 29 25  --  25 24 27   GLUCOSE 98   < > 92 96  --  123* 84 92  BUN 17   < > 24* 32*  --  26* 24 21  CREATININE 0.87   < > 1.19* 1.50*  --  1.28* 1.38* 1.12*  CALCIUM 8.8*    < > 8.9 9.3  --  8.7* 9.5 9.2  GFRNONAA >60   < > 47* 36*  --  43*  --  51*  PROT 7.0  --  6.5  --   --   --  7.0  --   ALBUMIN 2.9*  --  2.4*  --   --   --  3.7*  --   AST 13*  --  12*  --   --   --  13  --   ALT 11  --  10  --   --   --  6  --   ALKPHOS 70  --  72  --   --   --  81  --   BILITOT 0.7  --  0.5  --   --   --  0.8  --    < > = values in this interval not displayed.    RADIOGRAPHIC STUDIES: I have personally reviewed the radiological images as listed and agreed with the findings in the report. ECHOCARDIOGRAM COMPLETE  Result Date: 01/17/2023    ECHOCARDIOGRAM REPORT   Patient Name:   [facility] Kinch Date of Exam: 01/17/2023 Medical Rec #:  [id number]                     Height:       63.0 in Accession #:    [id number]                    Weight:       120.0 lb Date of Birth:  04-29-1945                    BSA:          1.556 m Patient Age:    76 years                      BP:           100/37 mmHg Patient Gender: F                             HR:           68 bpm. Exam Location:  Inpatient Procedure: 2D Echo, 3D Echo, Cardiac Doppler and Color Doppler Indications:    Z95.2 s/p Mitral Valve Clip implantation.  History:        Patient has prior history of Echocardiogram examinations, most                 recent 01/16/2023. Aortic Valve Disease and Mitral Valve                 Disease, Signs/Symptoms:Shortness of Breath and Dyspnea; Risk                 Factors:Current Smoker, Hypertension, Sleep Apnea and                 Dyslipidemia.                  Mitral Valve: Mitra-Clip valve is present in the mitral                 position. Procedure Date: 01/16/2023.  Sonographer:    Sheralyn Boatman RDCS Referring Phys: [phone] JILL D MCDANIEL IMPRESSIONS  1. Left ventricular ejection fraction, by estimation, is 65 to 70%. Left ventricular ejection fraction by 3D volume is 70 %. The left ventricle has normal function. The left ventricle has no regional wall motion abnormalities. There is  severe asymmetric  left ventricular hypertrophy. Left ventricular diastolic parameters are indeterminate.  2. Right ventricular systolic function is normal. The right ventricular size is normal.  3. Left atrial size was moderately dilated.  4. Right atrial size was moderately dilated.  5. Post placement of one NT Mitra Clip device in the A3 P3 position. The mitral valve has been repaired/replaced. Mild to moderate mitral valve regurgitation. Moderate mitral stenosis. The mean mitral valve gradient is 8.0 mmHg with average heart rate of 68 bpm. There is a Mitra-Clip present in the mitral position. Procedure Date: 01/16/2023.  6. The aortic valve is tricuspid. Aortic valve regurgitation is severe. No  aortic stenosis is present.  7. The inferior vena cava is normal in size with greater than 50% respiratory variability, suggesting right atrial pressure of 3 mmHg.  8. Evidence of atrial level shunting detected by color flow Doppler. There is a small atrial septal defect with predominantly left to right shunting across the atrial septum.  9. Patient imaged in sinus rhythm. FINDINGS  Left Ventricle: Left ventricular ejection fraction, by estimation, is 65 to 70%. Left ventricular ejection fraction by 3D volume is 70 %. The left ventricle has normal function. The left ventricle has no regional wall motion abnormalities. The left ventricular internal cavity size was normal in size. There is severe asymmetric left ventricular hypertrophy. Left ventricular diastolic parameters are indeterminate. Right Ventricle: The right ventricular size is normal. No increase in right ventricular wall thickness. Right ventricular systolic function is normal. Left Atrium: Left atrial size was moderately dilated. Right Atrium: Right atrial size was moderately dilated. Pericardium: There is no evidence of pericardial effusion. Mitral Valve: Post placement of one NT Mitra Clip device in the A3 P3 position. The mitral valve has been  repaired/replaced. Mild to moderate mitral valve regurgitation. There is a Mitra-Clip present in the mitral position. Procedure Date: 01/16/2023. Moderate mitral valve stenosis. MV peak gradient, 19.5 mmHg. The mean mitral valve gradient is 8.0 mmHg with average heart rate of 68 bpm. Tricuspid Valve: The tricuspid valve is normal in structure. Tricuspid valve regurgitation is mild. Aortic Valve: The aortic valve is tricuspid. Aortic valve regurgitation is severe. Aortic regurgitation PHT measures 357 msec. No aortic stenosis is present. Aortic valve mean gradient measures 9.0 mmHg. Aortic valve peak gradient measures 17.6 mmHg. Aortic valve area, by VTI measures 2.14 cm. Pulmonic Valve: The pulmonic valve was normal in structure. Pulmonic valve regurgitation is not visualized. No evidence of pulmonic stenosis. Aorta: The aortic root and ascending aorta are structurally normal, with no evidence of dilitation. Venous: The inferior vena cava is normal in size with greater than 50% respiratory variability, suggesting right atrial pressure of 3 mmHg. IAS/Shunts: Evidence of atrial level shunting detected by color flow Doppler. There is a small atrial septal defect with predominantly left to right shunting across the atrial septum.  LEFT VENTRICLE PLAX 2D LVIDd:         3.70 cm         Diastology LVIDs:         2.20 cm         LV e' medial:    5.00 cm/s LV PW:         1.80 cm         LV E/e' medial:  45.0 LV IVS:        1.40 cm         LV e' lateral:   5.88 cm/s LVOT diam:     1.80 cm         LV E/e' lateral: 38.3 LV SV:         87 LV SV Index:   56 LVOT Area:     2.54 cm        3D Volume EF                                LV 3D EF:    Left  ventricul LV Volumes (MOD)                            ar LV vol d, MOD    107.0 ml                   ejection A2C:                                        fraction LV vol d, MOD    99.9 ml                    by 3D A4C:                                         volume is LV vol s, MOD    22.3 ml                    70 %. A2C: LV vol s, MOD    25.3 ml A4C:                           3D Volume EF: LV SV MOD A2C:   84.7 ml       3D EF:        70 % LV SV MOD A4C:   99.9 ml       LV EDV:       133 ml LV SV MOD BP:    83.5 ml       LV ESV:       40 ml                                LV SV:        93 ml RIGHT VENTRICLE             IVC RV S prime:     13.40 cm/s  IVC diam: 1.90 cm TAPSE (M-mode): 2.0 cm LEFT ATRIUM             Index        RIGHT ATRIUM           Index LA diam:        3.60 cm 2.31 cm/m   RA Area:     18.10 cm LA Vol (A2C):   62.5 ml 40.16 ml/m  RA Volume:   50.60 ml  32.51 ml/m LA Vol (A4C):   62.4 ml 40.10 ml/m LA Biplane Vol: 65.8 ml 42.28 ml/m  AORTIC VALVE AV Area (Vmax):    1.97 cm AV Area (Vmean):   2.02 cm AV Area (VTI):     2.14 cm AV Vmax:           210.00 cm/s AV Vmean:          139.000 cm/s AV VTI:            0.405 m AV Peak Grad:      17.6 mmHg AV Mean Grad:      9.0 mmHg LVOT Vmax:         162.50 cm/s LVOT Vmean:        110.500 cm/s LVOT VTI:  0.340 m LVOT/AV VTI ratio: 0.84 AI PHT:            357 msec  AORTA Ao Root diam: 2.60 cm Ao Asc diam:  3.10 cm MITRAL VALVE                  TRICUSPID VALVE MV Area (PHT): 2.11 cm       TR Peak grad:   29.6 mmHg MV Area VTI:   1.22 cm       TR Vmax:        272.00 cm/s MV Peak grad:  19.5 mmHg MV Mean grad:  8.0 mmHg       SHUNTS MV Vmax:       2.21 m/s       Systemic VTI:  0.34 m MV Vmean:      134.5 cm/s     Systemic Diam: 1.80 cm MV Decel Time: 359 msec MR Peak grad:    100.4 mmHg MR Mean grad:    68.0 mmHg MR Vmax:         501.00 cm/s MR Vmean:        392.0 cm/s MR PISA:         2.26 cm MR PISA Eff ROA: 17 mm MR PISA Radius:  0.60 cm MV E velocity: 225.00 cm/s MV A velocity: 157.00 cm/s MV E/A ratio:  1.43 Riley Lam MD Electronically signed by Riley Lam MD Signature Date/Time: 01/17/2023/9:28:57 AM    Final    Structural Heart Procedure  Result Date:  01/17/2023 See full op note   ECHO TEE  Result Date: 01/16/2023    TRANSESOPHOGEAL ECHO REPORT   Patient Name:   KIELEY WEHRLI Niese Date of Exam: 01/16/2023 Medical Rec #:  [id number]                     Height:       63.0 in Accession #:    [id number]                    Weight:       120.0 lb Date of Birth:  06-May-1945                    BSA:          1.556 m Patient Age:    76 years                      BP:           112/44 mmHg Patient Gender: F                             HR:           114 bpm. Exam Location:  Inpatient Procedure: Transesophageal Echo, 3D Echo, Color Doppler and Cardiac Doppler Indications:     Mitral Regurgitation i34.0  History:         Patient has prior history of Echocardiogram examinations, most                  recent 01/01/2023. Risk Factors:Hypertension, Sleep Apnea and                  Dyslipidemia.  Sonographer:     Irving Burton Senior RDCS Referring Phys:  [id number] MICHAEL COOPER Diagnosing Phys: Riley Lam MD  Sonographer Comments: 1 MitraClip NT PROCEDURE: After discussion of the risks and  benefits of a TEE, an informed consent was obtained from the patient. The transesophogeal probe was passed without difficulty through the esophogus of the patient. Sedation performed by different physician. The patient was monitored while under deep sedation. The patient developed no complications during the procedure.  IMPRESSIONS  1. Left ventricular ejection fraction, by estimation, is 65 to 70%. The left ventricle has normal function.  2. Right ventricular systolic function is normal. The right ventricular size is normal.  3. Patient was in sinus rhythm during procedure, transitioned to atrial fibrillation, and was successfully cardioversted. Left atrial size was moderately dilated. No left atrial/left atrial appendage thrombus was detected. The LAA emptying velocity was 57 cm/s.  4. Right atrial size was moderately dilated.  5. Prior to procedure start, severe mitral  regurgitation. There was both a flail segment and a posterior cleft (best seen in 3D images). Complete systolic blunting of the left sided pulmonary vein signal. There was moderate tricuspid regurgitation with lipomatous interatrial septal hypertrophy. Suitable leaflet length for mTEER. Baseline mean gradient 4 mm Hg (sinus rhythm rate 87 bpm) with a double envelope artifact. X-plane mva 3.93 cm2.     During the procedure a interior posterior transeptal puncture was performed. 1 MitraClip NT was placed at the A3-P3 defect away from the cleft.     Patient was cardioverted.     In sinus rhythm, mild residual mitral regurgitation. Patient has +1 MR with improved systolic flow through pulmonary veins. 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 No pericardial effusion. 3D MVA 2.9 cm2 Mean gradient of 8 mm Hg at a rate of 72 bpm.. The mitral valve has been repaired/replaced. Mild mitral valve regurgitation.  6. Tricuspid valve regurgitation is mild to moderate.  7. The aortic valve is tricuspid. Aortic valve regurgitation is severe. No aortic stenosis is present.  8. There is Moderate (Grade III) plaque involving the descending aorta.  9. Severely dilated pulmonary artery. 10. Evidence of atrial level shunting detected by color flow Doppler. Comparison(s): Successful MitraClip placement with residual mitral valve gradient. FINDINGS  Left Ventricle: Left ventricular ejection fraction, by estimation, is 65 to 70%. The left ventricle has normal function. The left ventricular internal cavity size was normal in size. Right Ventricle: The right ventricular size is normal. No increase in right ventricular wall thickness. Right ventricular systolic function is normal. Left Atrium: Patient was in sinus rhythm during procedure, transitioned to atrial fibrillation, and was successfully cardioversted. Left atrial size was moderately dilated. No left atrial/left atrial appendage thrombus was detected. The LAA  emptying velocity was 57 cm/s. Right Atrium: Right atrial size was moderately dilated. Pericardium: There is no evidence of pericardial effusion. Mitral Valve: Prior to procedure start, severe mitral regurgitation. There was both a flail segment and a posterior cleft (best seen in 3D images). Complete systolic blunting of the left sided pulmonary vein signal. There was moderate tricuspid regurgitation with lipomatous interatrial septal hypertrophy. Suitable leaflet length for mTEER. Baseline mean gradient 4 mm Hg (sinus rhythm rate 87 bpm) with a double envelope artifact. X-plane mva 3.93 cm2. During the procedure a interior posterior transeptal puncture was performed. 1 MitraClip NT was placed at the A3-P3 defect away from the cleft. Patient was cardioverted. In sinus rhythm, mild residual mitral regurgitation. Patient has +1 MR with improved systolic flow through pulmonary veins. 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 No pericardial effusion. 3D MVA 2.9 cm2 Mean gradient of 8 mm Hg at a  rate of 72 bpm.  The mitral valve has been repaired/replaced. Mild mitral valve regurgitation. MV peak gradient, 9.5 mmHg. The mean mitral valve gradient is 4.0 mmHg. Tricuspid Valve: The tricuspid valve is normal in structure. Tricuspid valve regurgitation is mild to moderate. No evidence of tricuspid stenosis. Aortic Valve: The aortic valve is tricuspid. Aortic valve regurgitation is severe. Aortic regurgitation PHT measures 209 msec. No aortic stenosis is present. Pulmonic Valve: The pulmonic valve was normal in structure. Pulmonic valve regurgitation is trivial. No evidence of pulmonic stenosis. Aorta: The aortic root, ascending aorta, aortic arch and descending aorta are all structurally normal, with no evidence of dilitation or obstruction. There is moderate (Grade III) plaque involving the descending aorta. Pulmonary Artery: The pulmonary artery is severely dilated. IAS/Shunts: Evidence of atrial level  shunting detected by color flow Doppler. Additional Comments: Spectral Doppler performed. AORTIC VALVE LVOT Vmax:   202.00 cm/s LVOT Vmean:  129.000 cm/s LVOT VTI:    0.373 m AI PHT:      209 msec MITRAL VALVE MV Peak grad: 9.5 mmHg    SHUNTS MV Mean grad: 4.0 mmHg    Systemic VTI: 0.37 m MV Vmax:      1.54 m/s MV Vmean:     97.9 cm/s MR Peak grad: 143.5 mmHg MR Mean grad: 90.0 mmHg MR Vmax:      599.00 cm/s MR Vmean:     445.0 cm/s Riley Lam MD Electronically signed by Riley Lam MD Signature Date/Time: 01/16/2023/11:24:13 AM    Final    DG Chest 2 View  Result Date: 01/16/2023 CLINICAL DATA:  [id number] Pre-op chest exam [id number]. Severe mitral insufficiency. History of congestive heart failure, atrial fibrillation and COPD. EXAM: CHEST - 2 VIEW COMPARISON:  Chest radiograph 12/24/2022.  Chest CT 01/03/2023. FINDINGS: Improved pulmonary edema with small residual right pleural effusion. Patchy lucency of the upper lungs, consistent with emphysema. Stable cardiac and mediastinal contours. No pneumothorax. IMPRESSION: Improved pulmonary edema with small residual right pleural effusion. Electronically Signed   By: Orvan Falconer M.D.   On: 01/16/2023 08:45

## 2023-01-31 NOTE — Assessment & Plan Note (Addendum)
In October, elevated ferritin. Normal folate.  Borderline low normal B12. Procedure on 10/23 and hgb dropped on 10/24.  Repeat ferritin, B12

## 2023-01-31 NOTE — Assessment & Plan Note (Signed)
From CT in Oct: New solid pulmonary nodule of the right upper lobe measuring 4 mm  This is very small and cannot be biopsied. Too small for PET to be useful. Follow up with CT

## 2023-02-04 ENCOUNTER — Inpatient Hospital Stay: Payer: Medicare Other

## 2023-02-04 ENCOUNTER — Inpatient Hospital Stay (HOSPITAL_BASED_OUTPATIENT_CLINIC_OR_DEPARTMENT_OTHER): Payer: Medicare Other

## 2023-02-04 VITALS — BP 92/42 | HR 125 | Temp 97.5°F | Resp 18 | Wt 121.0 lb

## 2023-02-04 DIAGNOSIS — R059 Cough, unspecified: Secondary | ICD-10-CM | POA: Diagnosis not present

## 2023-02-04 DIAGNOSIS — I959 Hypotension, unspecified: Secondary | ICD-10-CM | POA: Diagnosis not present

## 2023-02-04 DIAGNOSIS — A419 Sepsis, unspecified organism: Secondary | ICD-10-CM | POA: Diagnosis not present

## 2023-02-04 DIAGNOSIS — R911 Solitary pulmonary nodule: Secondary | ICD-10-CM | POA: Insufficient documentation

## 2023-02-04 DIAGNOSIS — I5033 Acute on chronic diastolic (congestive) heart failure: Secondary | ICD-10-CM | POA: Diagnosis present

## 2023-02-04 DIAGNOSIS — T82528A Displacement of other cardiac and vascular devices and implants, initial encounter: Secondary | ICD-10-CM | POA: Diagnosis present

## 2023-02-04 DIAGNOSIS — R918 Other nonspecific abnormal finding of lung field: Secondary | ICD-10-CM | POA: Diagnosis not present

## 2023-02-04 DIAGNOSIS — J189 Pneumonia, unspecified organism: Secondary | ICD-10-CM | POA: Diagnosis present

## 2023-02-04 DIAGNOSIS — I130 Hypertensive heart and chronic kidney disease with heart failure and stage 1 through stage 4 chronic kidney disease, or unspecified chronic kidney disease: Secondary | ICD-10-CM | POA: Diagnosis present

## 2023-02-04 DIAGNOSIS — R0989 Other specified symptoms and signs involving the circulatory and respiratory systems: Secondary | ICD-10-CM | POA: Diagnosis not present

## 2023-02-04 DIAGNOSIS — R54 Age-related physical debility: Secondary | ICD-10-CM | POA: Diagnosis present

## 2023-02-04 DIAGNOSIS — D631 Anemia in chronic kidney disease: Secondary | ICD-10-CM | POA: Diagnosis present

## 2023-02-04 DIAGNOSIS — E8809 Other disorders of plasma-protein metabolism, not elsewhere classified: Secondary | ICD-10-CM | POA: Diagnosis present

## 2023-02-04 DIAGNOSIS — C679 Malignant neoplasm of bladder, unspecified: Secondary | ICD-10-CM

## 2023-02-04 DIAGNOSIS — F17210 Nicotine dependence, cigarettes, uncomplicated: Secondary | ICD-10-CM | POA: Diagnosis not present

## 2023-02-04 DIAGNOSIS — G629 Polyneuropathy, unspecified: Secondary | ICD-10-CM | POA: Diagnosis present

## 2023-02-04 DIAGNOSIS — Z801 Family history of malignant neoplasm of trachea, bronchus and lung: Secondary | ICD-10-CM | POA: Diagnosis not present

## 2023-02-04 DIAGNOSIS — J440 Chronic obstructive pulmonary disease with acute lower respiratory infection: Secondary | ICD-10-CM | POA: Diagnosis present

## 2023-02-04 DIAGNOSIS — R570 Cardiogenic shock: Secondary | ICD-10-CM | POA: Diagnosis not present

## 2023-02-04 DIAGNOSIS — Z8051 Family history of malignant neoplasm of kidney: Secondary | ICD-10-CM

## 2023-02-04 DIAGNOSIS — J984 Other disorders of lung: Secondary | ICD-10-CM | POA: Diagnosis not present

## 2023-02-04 DIAGNOSIS — D649 Anemia, unspecified: Secondary | ICD-10-CM | POA: Diagnosis not present

## 2023-02-04 DIAGNOSIS — I083 Combined rheumatic disorders of mitral, aortic and tricuspid valves: Secondary | ICD-10-CM | POA: Diagnosis present

## 2023-02-04 DIAGNOSIS — N289 Disorder of kidney and ureter, unspecified: Secondary | ICD-10-CM | POA: Diagnosis not present

## 2023-02-04 DIAGNOSIS — R6521 Severe sepsis with septic shock: Secondary | ICD-10-CM | POA: Diagnosis not present

## 2023-02-04 DIAGNOSIS — J441 Chronic obstructive pulmonary disease with (acute) exacerbation: Secondary | ICD-10-CM | POA: Diagnosis present

## 2023-02-04 DIAGNOSIS — N1831 Chronic kidney disease, stage 3a: Secondary | ICD-10-CM | POA: Diagnosis present

## 2023-02-04 DIAGNOSIS — Z66 Do not resuscitate: Secondary | ICD-10-CM | POA: Diagnosis present

## 2023-02-04 DIAGNOSIS — Z8052 Family history of malignant neoplasm of bladder: Secondary | ICD-10-CM

## 2023-02-04 DIAGNOSIS — I517 Cardiomegaly: Secondary | ICD-10-CM | POA: Diagnosis not present

## 2023-02-04 DIAGNOSIS — D62 Acute posthemorrhagic anemia: Secondary | ICD-10-CM | POA: Diagnosis not present

## 2023-02-04 DIAGNOSIS — Z1152 Encounter for screening for COVID-19: Secondary | ICD-10-CM | POA: Diagnosis not present

## 2023-02-04 DIAGNOSIS — K219 Gastro-esophageal reflux disease without esophagitis: Secondary | ICD-10-CM | POA: Diagnosis present

## 2023-02-04 DIAGNOSIS — R042 Hemoptysis: Secondary | ICD-10-CM | POA: Diagnosis not present

## 2023-02-04 DIAGNOSIS — I351 Nonrheumatic aortic (valve) insufficiency: Secondary | ICD-10-CM | POA: Diagnosis not present

## 2023-02-04 DIAGNOSIS — E871 Hypo-osmolality and hyponatremia: Secondary | ICD-10-CM | POA: Diagnosis present

## 2023-02-04 DIAGNOSIS — E872 Acidosis, unspecified: Secondary | ICD-10-CM | POA: Diagnosis present

## 2023-02-04 DIAGNOSIS — I700 Atherosclerosis of aorta: Secondary | ICD-10-CM | POA: Diagnosis not present

## 2023-02-04 DIAGNOSIS — J9621 Acute and chronic respiratory failure with hypoxia: Secondary | ICD-10-CM | POA: Diagnosis present

## 2023-02-04 DIAGNOSIS — J90 Pleural effusion, not elsewhere classified: Secondary | ICD-10-CM | POA: Diagnosis not present

## 2023-02-04 DIAGNOSIS — I340 Nonrheumatic mitral (valve) insufficiency: Secondary | ICD-10-CM | POA: Diagnosis not present

## 2023-02-04 DIAGNOSIS — J810 Acute pulmonary edema: Secondary | ICD-10-CM | POA: Diagnosis not present

## 2023-02-04 DIAGNOSIS — Y838 Other surgical procedures as the cause of abnormal reaction of the patient, or of later complication, without mention of misadventure at the time of the procedure: Secondary | ICD-10-CM | POA: Diagnosis present

## 2023-02-04 DIAGNOSIS — I4819 Other persistent atrial fibrillation: Secondary | ICD-10-CM | POA: Diagnosis not present

## 2023-02-04 DIAGNOSIS — Z452 Encounter for adjustment and management of vascular access device: Secondary | ICD-10-CM | POA: Diagnosis not present

## 2023-02-04 DIAGNOSIS — E7800 Pure hypercholesterolemia, unspecified: Secondary | ICD-10-CM | POA: Diagnosis present

## 2023-02-04 DIAGNOSIS — Z515 Encounter for palliative care: Secondary | ICD-10-CM | POA: Diagnosis not present

## 2023-02-04 LAB — CBC WITH DIFFERENTIAL (CANCER CENTER ONLY)
Abs Immature Granulocytes: 0.04 10*3/uL (ref 0.00–0.07)
Basophils Absolute: 0 10*3/uL (ref 0.0–0.1)
Basophils Relative: 0 %
Eosinophils Absolute: 0 10*3/uL (ref 0.0–0.5)
Eosinophils Relative: 0 %
HCT: 28 % — ABNORMAL LOW (ref 36.0–46.0)
Hemoglobin: 8.6 g/dL — ABNORMAL LOW (ref 12.0–15.0)
Immature Granulocytes: 1 %
Lymphocytes Relative: 15 %
Lymphs Abs: 1.2 10*3/uL (ref 0.7–4.0)
MCH: 29.4 pg (ref 26.0–34.0)
MCHC: 30.7 g/dL (ref 30.0–36.0)
MCV: 95.6 fL (ref 80.0–100.0)
Monocytes Absolute: 0.5 10*3/uL (ref 0.1–1.0)
Monocytes Relative: 6 %
Neutro Abs: 6.5 10*3/uL (ref 1.7–7.7)
Neutrophils Relative %: 78 %
Platelet Count: 147 10*3/uL — ABNORMAL LOW (ref 150–400)
RBC: 2.93 MIL/uL — ABNORMAL LOW (ref 3.87–5.11)
RDW: 18.4 % — ABNORMAL HIGH (ref 11.5–15.5)
Smear Review: DECREASED
WBC Count: 8.2 10*3/uL (ref 4.0–10.5)
nRBC: 0 % (ref 0.0–0.2)

## 2023-02-04 LAB — BASIC METABOLIC PANEL - CANCER CENTER ONLY
Anion gap: 8 (ref 5–15)
BUN: 21 mg/dL (ref 8–23)
CO2: 27 mmol/L (ref 22–32)
Calcium: 9.2 mg/dL (ref 8.9–10.3)
Chloride: 102 mmol/L (ref 98–111)
Creatinine: 1.12 mg/dL — ABNORMAL HIGH (ref 0.44–1.00)
GFR, Estimated: 51 mL/min — ABNORMAL LOW (ref >60)
Glucose, Bld: 92 mg/dL (ref 70–99)
Potassium: 3.7 mmol/L (ref 3.5–5.1)
Sodium: 137 mmol/L (ref 135–145)

## 2023-02-04 LAB — HEPATIC FUNCTION PANEL
ALT: 10 U/L (ref 0–44)
AST: 16 U/L (ref 15–41)
Albumin: 2.7 g/dL — ABNORMAL LOW (ref 3.5–5.0)
Alkaline Phosphatase: 73 U/L (ref 38–126)
Bilirubin, Direct: 0.2 mg/dL (ref 0.0–0.2)
Indirect Bilirubin: 0.6 mg/dL (ref 0.3–0.9)
Total Bilirubin: 0.8 mg/dL (ref <1.2)
Total Protein: 6.7 g/dL (ref 6.5–8.1)

## 2023-02-04 LAB — IRON AND IRON BINDING CAPACITY (CC-WL,HP ONLY)
Iron: 22 ug/dL — ABNORMAL LOW (ref 28–170)
Saturation Ratios: 9 % — ABNORMAL LOW (ref 10.4–31.8)
TIBC: 255 ug/dL (ref 250–450)
UIBC: 233 ug/dL (ref 148–442)

## 2023-02-04 LAB — FERRITIN: Ferritin: 406 ng/mL — ABNORMAL HIGH (ref 11–307)

## 2023-02-04 LAB — SAMPLE TO BLOOD BANK

## 2023-02-04 LAB — FOLATE: Folate: 14.3 ng/mL (ref >5.9)

## 2023-02-04 LAB — LACTATE DEHYDROGENASE: LDH: 185 U/L (ref 98–192)

## 2023-02-04 LAB — VITAMIN B12: Vitamin B-12: 694 pg/mL (ref 180–914)

## 2023-02-04 NOTE — Assessment & Plan Note (Addendum)
She had a TURBT in the hospital on 10/05/2022.  She will continue BCG. Now completed 4 weekly doses.

## 2023-02-05 ENCOUNTER — Telehealth: Payer: Self-pay | Admitting: Cardiovascular Disease

## 2023-02-05 NOTE — Telephone Encounter (Signed)
Left voicemail to return call to office.

## 2023-02-05 NOTE — Telephone Encounter (Signed)
Appt made

## 2023-02-05 NOTE — Telephone Encounter (Signed)
If I am looking correctly, [facility] may have an opening at 2pm today?

## 2023-02-05 NOTE — Telephone Encounter (Signed)
Pt's daughter needs a callback regarding the pt's amiodarone. It is causing the pt not to be able to eat. Please advise

## 2023-02-05 NOTE — Telephone Encounter (Signed)
Reviewed notes. She was doing better when she saw Noreene Larsson, but nausea must have worsened again. Would stop amiodarone and get her into [facility] as soon as possible. They can review options for other anti-arrhythmic drugs with her. thanks

## 2023-02-05 NOTE — Telephone Encounter (Signed)
Spoke with daughter per DPR and she states her mom has been going in and out of AFIB.  Yesterday at her oncology appointment her BP was low 92/44 but her hearst rate was 130. Dr. Cherly Hensen wanted her to go to hospital but she declined. She states she has been taking half tablet of her amiodarone but the nausea is so bad she is not eating. She states she can no longer take the amiodarone because she is not eating. She is not able to give me any vitals today. She would like other options. ED precautions discussed.

## 2023-02-05 NOTE — Telephone Encounter (Signed)
Pt was returning nurse call and is requesting a callback. Please advise. 

## 2023-02-06 ENCOUNTER — Telehealth: Payer: Self-pay

## 2023-02-06 ENCOUNTER — Other Ambulatory Visit: Payer: Self-pay

## 2023-02-06 ENCOUNTER — Telehealth: Payer: Self-pay | Admitting: Cardiovascular Disease

## 2023-02-06 ENCOUNTER — Emergency Department (HOSPITAL_COMMUNITY): Payer: Medicare Other

## 2023-02-06 ENCOUNTER — Inpatient Hospital Stay (HOSPITAL_COMMUNITY)
Admission: EM | Admit: 2023-02-06 | Discharge: 2023-02-24 | DRG: 871 | Disposition: E | Payer: Medicare Other | Attending: Pulmonary Disease | Admitting: Pulmonary Disease

## 2023-02-06 ENCOUNTER — Encounter (HOSPITAL_COMMUNITY): Payer: Self-pay

## 2023-02-06 ENCOUNTER — Inpatient Hospital Stay (HOSPITAL_COMMUNITY): Payer: Medicare Other

## 2023-02-06 DIAGNOSIS — I5033 Acute on chronic diastolic (congestive) heart failure: Secondary | ICD-10-CM | POA: Diagnosis present

## 2023-02-06 DIAGNOSIS — Z90710 Acquired absence of both cervix and uterus: Secondary | ICD-10-CM

## 2023-02-06 DIAGNOSIS — G4733 Obstructive sleep apnea (adult) (pediatric): Secondary | ICD-10-CM | POA: Diagnosis present

## 2023-02-06 DIAGNOSIS — J440 Chronic obstructive pulmonary disease with acute lower respiratory infection: Secondary | ICD-10-CM | POA: Diagnosis present

## 2023-02-06 DIAGNOSIS — Z7984 Long term (current) use of oral hypoglycemic drugs: Secondary | ICD-10-CM

## 2023-02-06 DIAGNOSIS — E7800 Pure hypercholesterolemia, unspecified: Secondary | ICD-10-CM | POA: Diagnosis present

## 2023-02-06 DIAGNOSIS — I130 Hypertensive heart and chronic kidney disease with heart failure and stage 1 through stage 4 chronic kidney disease, or unspecified chronic kidney disease: Secondary | ICD-10-CM | POA: Diagnosis present

## 2023-02-06 DIAGNOSIS — E871 Hypo-osmolality and hyponatremia: Secondary | ICD-10-CM | POA: Diagnosis present

## 2023-02-06 DIAGNOSIS — I2510 Atherosclerotic heart disease of native coronary artery without angina pectoris: Secondary | ICD-10-CM | POA: Diagnosis present

## 2023-02-06 DIAGNOSIS — J441 Chronic obstructive pulmonary disease with (acute) exacerbation: Secondary | ICD-10-CM | POA: Diagnosis present

## 2023-02-06 DIAGNOSIS — Z8249 Family history of ischemic heart disease and other diseases of the circulatory system: Secondary | ICD-10-CM

## 2023-02-06 DIAGNOSIS — R0989 Other specified symptoms and signs involving the circulatory and respiratory systems: Secondary | ICD-10-CM | POA: Diagnosis not present

## 2023-02-06 DIAGNOSIS — E872 Acidosis, unspecified: Secondary | ICD-10-CM | POA: Diagnosis present

## 2023-02-06 DIAGNOSIS — A419 Sepsis, unspecified organism: Principal | ICD-10-CM | POA: Diagnosis present

## 2023-02-06 DIAGNOSIS — Z1152 Encounter for screening for COVID-19: Secondary | ICD-10-CM | POA: Diagnosis not present

## 2023-02-06 DIAGNOSIS — Z79899 Other long term (current) drug therapy: Secondary | ICD-10-CM

## 2023-02-06 DIAGNOSIS — J189 Pneumonia, unspecified organism: Secondary | ICD-10-CM | POA: Diagnosis present

## 2023-02-06 DIAGNOSIS — J9621 Acute and chronic respiratory failure with hypoxia: Secondary | ICD-10-CM | POA: Diagnosis present

## 2023-02-06 DIAGNOSIS — I700 Atherosclerosis of aorta: Secondary | ICD-10-CM | POA: Diagnosis not present

## 2023-02-06 DIAGNOSIS — N1831 Chronic kidney disease, stage 3a: Secondary | ICD-10-CM | POA: Diagnosis present

## 2023-02-06 DIAGNOSIS — E876 Hypokalemia: Secondary | ICD-10-CM | POA: Diagnosis present

## 2023-02-06 DIAGNOSIS — Z95818 Presence of other cardiac implants and grafts: Secondary | ICD-10-CM

## 2023-02-06 DIAGNOSIS — R6521 Severe sepsis with septic shock: Secondary | ICD-10-CM | POA: Diagnosis present

## 2023-02-06 DIAGNOSIS — D631 Anemia in chronic kidney disease: Secondary | ICD-10-CM | POA: Diagnosis present

## 2023-02-06 DIAGNOSIS — I083 Combined rheumatic disorders of mitral, aortic and tricuspid valves: Secondary | ICD-10-CM | POA: Diagnosis present

## 2023-02-06 DIAGNOSIS — J810 Acute pulmonary edema: Secondary | ICD-10-CM | POA: Diagnosis not present

## 2023-02-06 DIAGNOSIS — T82528A Displacement of other cardiac and vascular devices and implants, initial encounter: Secondary | ICD-10-CM | POA: Diagnosis present

## 2023-02-06 DIAGNOSIS — Z7901 Long term (current) use of anticoagulants: Secondary | ICD-10-CM

## 2023-02-06 DIAGNOSIS — J984 Other disorders of lung: Secondary | ICD-10-CM | POA: Diagnosis not present

## 2023-02-06 DIAGNOSIS — R059 Cough, unspecified: Secondary | ICD-10-CM | POA: Diagnosis not present

## 2023-02-06 DIAGNOSIS — Z515 Encounter for palliative care: Secondary | ICD-10-CM

## 2023-02-06 DIAGNOSIS — Z66 Do not resuscitate: Secondary | ICD-10-CM | POA: Diagnosis present

## 2023-02-06 DIAGNOSIS — R54 Age-related physical debility: Secondary | ICD-10-CM | POA: Diagnosis present

## 2023-02-06 DIAGNOSIS — G629 Polyneuropathy, unspecified: Secondary | ICD-10-CM | POA: Diagnosis present

## 2023-02-06 DIAGNOSIS — Z452 Encounter for adjustment and management of vascular access device: Secondary | ICD-10-CM | POA: Diagnosis not present

## 2023-02-06 DIAGNOSIS — K219 Gastro-esophageal reflux disease without esophagitis: Secondary | ICD-10-CM | POA: Diagnosis present

## 2023-02-06 DIAGNOSIS — I517 Cardiomegaly: Secondary | ICD-10-CM | POA: Diagnosis not present

## 2023-02-06 DIAGNOSIS — I252 Old myocardial infarction: Secondary | ICD-10-CM

## 2023-02-06 DIAGNOSIS — Z716 Tobacco abuse counseling: Secondary | ICD-10-CM

## 2023-02-06 DIAGNOSIS — Z7951 Long term (current) use of inhaled steroids: Secondary | ICD-10-CM

## 2023-02-06 DIAGNOSIS — I9589 Other hypotension: Secondary | ICD-10-CM | POA: Diagnosis present

## 2023-02-06 DIAGNOSIS — Z8551 Personal history of malignant neoplasm of bladder: Secondary | ICD-10-CM

## 2023-02-06 DIAGNOSIS — Z8052 Family history of malignant neoplasm of bladder: Secondary | ICD-10-CM

## 2023-02-06 DIAGNOSIS — I351 Nonrheumatic aortic (valve) insufficiency: Secondary | ICD-10-CM | POA: Diagnosis not present

## 2023-02-06 DIAGNOSIS — F17210 Nicotine dependence, cigarettes, uncomplicated: Secondary | ICD-10-CM | POA: Diagnosis present

## 2023-02-06 DIAGNOSIS — I959 Hypotension, unspecified: Secondary | ICD-10-CM | POA: Diagnosis not present

## 2023-02-06 DIAGNOSIS — I4819 Other persistent atrial fibrillation: Secondary | ICD-10-CM | POA: Diagnosis present

## 2023-02-06 DIAGNOSIS — R918 Other nonspecific abnormal finding of lung field: Secondary | ICD-10-CM | POA: Diagnosis not present

## 2023-02-06 DIAGNOSIS — J90 Pleural effusion, not elsewhere classified: Secondary | ICD-10-CM | POA: Diagnosis not present

## 2023-02-06 DIAGNOSIS — R570 Cardiogenic shock: Secondary | ICD-10-CM | POA: Diagnosis not present

## 2023-02-06 DIAGNOSIS — I340 Nonrheumatic mitral (valve) insufficiency: Secondary | ICD-10-CM | POA: Diagnosis not present

## 2023-02-06 DIAGNOSIS — E8809 Other disorders of plasma-protein metabolism, not elsewhere classified: Secondary | ICD-10-CM | POA: Diagnosis present

## 2023-02-06 DIAGNOSIS — D62 Acute posthemorrhagic anemia: Secondary | ICD-10-CM | POA: Diagnosis not present

## 2023-02-06 DIAGNOSIS — R911 Solitary pulmonary nodule: Secondary | ICD-10-CM | POA: Diagnosis present

## 2023-02-06 DIAGNOSIS — Y838 Other surgical procedures as the cause of abnormal reaction of the patient, or of later complication, without mention of misadventure at the time of the procedure: Secondary | ICD-10-CM | POA: Diagnosis present

## 2023-02-06 DIAGNOSIS — Z888 Allergy status to other drugs, medicaments and biological substances status: Secondary | ICD-10-CM

## 2023-02-06 DIAGNOSIS — R042 Hemoptysis: Secondary | ICD-10-CM | POA: Diagnosis not present

## 2023-02-06 LAB — TROPONIN I (HIGH SENSITIVITY)
Troponin I (High Sensitivity): 16 ng/L (ref <18)
Troponin I (High Sensitivity): 23 ng/L — ABNORMAL HIGH (ref <18)

## 2023-02-06 LAB — MRSA NEXT GEN BY PCR, NASAL: MRSA by PCR Next Gen: NOT DETECTED

## 2023-02-06 LAB — CBC WITH DIFFERENTIAL/PLATELET
Abs Immature Granulocytes: 0.07 10*3/uL (ref 0.00–0.07)
Basophils Absolute: 0 10*3/uL (ref 0.0–0.1)
Basophils Relative: 0 %
Eosinophils Absolute: 0 10*3/uL (ref 0.0–0.5)
Eosinophils Relative: 0 %
HCT: 25.2 % — ABNORMAL LOW (ref 36.0–46.0)
Hemoglobin: 7.6 g/dL — ABNORMAL LOW (ref 12.0–15.0)
Immature Granulocytes: 1 %
Lymphocytes Relative: 9 %
Lymphs Abs: 1 10*3/uL (ref 0.7–4.0)
MCH: 28.9 pg (ref 26.0–34.0)
MCHC: 30.2 g/dL (ref 30.0–36.0)
MCV: 95.8 fL (ref 80.0–100.0)
Monocytes Absolute: 0.6 10*3/uL (ref 0.1–1.0)
Monocytes Relative: 5 %
Neutro Abs: 9.3 10*3/uL — ABNORMAL HIGH (ref 1.7–7.7)
Neutrophils Relative %: 85 %
Platelets: 145 10*3/uL — ABNORMAL LOW (ref 150–400)
RBC: 2.63 MIL/uL — ABNORMAL LOW (ref 3.87–5.11)
RDW: 18.5 % — ABNORMAL HIGH (ref 11.5–15.5)
WBC: 11 10*3/uL — ABNORMAL HIGH (ref 4.0–10.5)
nRBC: 0 % (ref 0.0–0.2)

## 2023-02-06 LAB — RESP PANEL BY RT-PCR (RSV, FLU A&B, COVID)  RVPGX2
Influenza A by PCR: NEGATIVE
Influenza B by PCR: NEGATIVE
Resp Syncytial Virus by PCR: NEGATIVE
SARS Coronavirus 2 by RT PCR: NEGATIVE

## 2023-02-06 LAB — LACTIC ACID, PLASMA
Lactic Acid, Venous: 2.7 mmol/L (ref 0.5–1.9)
Lactic Acid, Venous: 0.9 mmol/L (ref 0.5–1.9)

## 2023-02-06 LAB — COMPREHENSIVE METABOLIC PANEL
ALT: 10 U/L (ref 0–44)
AST: 14 U/L — ABNORMAL LOW (ref 15–41)
Albumin: 2.3 g/dL — ABNORMAL LOW (ref 3.5–5.0)
Alkaline Phosphatase: 65 U/L (ref 38–126)
Anion gap: 11 (ref 5–15)
BUN: 23 mg/dL (ref 8–23)
CO2: 22 mmol/L (ref 22–32)
Calcium: 8.6 mg/dL — ABNORMAL LOW (ref 8.9–10.3)
Chloride: 101 mmol/L (ref 98–111)
Creatinine, Ser: 1.38 mg/dL — ABNORMAL HIGH (ref 0.44–1.00)
GFR, Estimated: 40 mL/min — ABNORMAL LOW (ref >60)
Glucose, Bld: 130 mg/dL — ABNORMAL HIGH (ref 70–99)
Potassium: 3.4 mmol/L — ABNORMAL LOW (ref 3.5–5.1)
Sodium: 134 mmol/L — ABNORMAL LOW (ref 135–145)
Total Bilirubin: 1 mg/dL (ref <1.2)
Total Protein: 6.1 g/dL — ABNORMAL LOW (ref 6.5–8.1)

## 2023-02-06 LAB — URINALYSIS, W/ REFLEX TO CULTURE (INFECTION SUSPECTED)
Bilirubin Urine: NEGATIVE
Glucose, UA: >=500 mg/dL — AB
Ketones, ur: NEGATIVE mg/dL
Leukocytes,Ua: NEGATIVE
Nitrite: NEGATIVE
Protein, ur: NEGATIVE mg/dL
Specific Gravity, Urine: 1.009 (ref 1.005–1.030)
pH: 5 (ref 5.0–8.0)

## 2023-02-06 LAB — APTT: aPTT: 46 s — ABNORMAL HIGH (ref 24–36)

## 2023-02-06 LAB — GLUCOSE, CAPILLARY
Glucose-Capillary: 148 mg/dL — ABNORMAL HIGH (ref 70–99)
Glucose-Capillary: 165 mg/dL — ABNORMAL HIGH (ref 70–99)
Glucose-Capillary: 190 mg/dL — ABNORMAL HIGH (ref 70–99)

## 2023-02-06 LAB — MAGNESIUM: Magnesium: 1.9 mg/dL (ref 1.7–2.4)

## 2023-02-06 LAB — CG4 I-STAT (LACTIC ACID): Lactic Acid, Venous: 2.5 mmol/L (ref 0.5–1.9)

## 2023-02-06 LAB — PROCALCITONIN: Procalcitonin: 0.26 ng/mL

## 2023-02-06 LAB — STREP PNEUMONIAE URINARY ANTIGEN: Strep Pneumo Urinary Antigen: NEGATIVE

## 2023-02-06 LAB — PROTIME-INR
INR: 2.5 — ABNORMAL HIGH (ref 0.8–1.2)
Prothrombin Time: 27.5 s — ABNORMAL HIGH (ref 11.4–15.2)

## 2023-02-06 LAB — COPPER, SERUM: Copper: 221 ug/dL — ABNORMAL HIGH (ref 80–158)

## 2023-02-06 MED ORDER — AMIODARONE HCL 200 MG PO TABS
100.0000 mg | ORAL_TABLET | Freq: Every day | ORAL | Status: DC
  Administered 2023-02-06 – 2023-02-07 (×2): 100 mg via ORAL
  Filled 2023-02-06 (×2): qty 1

## 2023-02-06 MED ORDER — SODIUM CHLORIDE 0.9 % IV BOLUS
1000.0000 mL | Freq: Once | INTRAVENOUS | Status: AC
  Administered 2023-02-06: 1000 mL via INTRAVENOUS

## 2023-02-06 MED ORDER — SODIUM CHLORIDE 0.9% FLUSH
10.0000 mL | Freq: Two times a day (BID) | INTRAVENOUS | Status: DC
  Administered 2023-02-07: 10 mL
  Administered 2023-02-08: 20 mL

## 2023-02-06 MED ORDER — NOREPINEPHRINE 4 MG/250ML-% IV SOLN
0.0000 ug/min | INTRAVENOUS | Status: DC
  Administered 2023-02-06: 2 ug/min via INTRAVENOUS
  Filled 2023-02-06: qty 250

## 2023-02-06 MED ORDER — DEXTROSE 5 % IV SOLN
500.0000 mg | INTRAVENOUS | Status: DC
  Administered 2023-02-06 – 2023-02-08 (×3): 500 mg via INTRAVENOUS
  Filled 2023-02-06 (×5): qty 5

## 2023-02-06 MED ORDER — SODIUM CHLORIDE 0.9% FLUSH: 10.0000 mL | INTRAVENOUS | Status: DC | PRN

## 2023-02-06 MED ORDER — SODIUM CHLORIDE 0.9 % IV BOLUS: 1000.0000 mL | Freq: Once | INTRAVENOUS | Status: AC

## 2023-02-06 MED ORDER — CHLORHEXIDINE GLUCONATE CLOTH 2 % EX PADS
6.0000 | MEDICATED_PAD | Freq: Every day | CUTANEOUS | Status: DC
  Administered 2023-02-07 – 2023-02-08 (×3): 6 via TOPICAL

## 2023-02-06 MED ORDER — ORAL CARE MOUTH RINSE: 15.0000 mL | OROMUCOSAL | Status: DC | PRN

## 2023-02-06 MED ORDER — MIDODRINE HCL 5 MG PO TABS
10.0000 mg | ORAL_TABLET | Freq: Three times a day (TID) | ORAL | Status: DC
  Administered 2023-02-06 – 2023-02-08 (×6): 10 mg via ORAL
  Filled 2023-02-06 (×6): qty 2

## 2023-02-06 MED ORDER — SODIUM CHLORIDE 0.9 % IV SOLN
2.0000 g | INTRAVENOUS | Status: DC
  Administered 2023-02-06 – 2023-02-08 (×3): 2 g via INTRAVENOUS
  Filled 2023-02-06 (×4): qty 20

## 2023-02-06 MED ORDER — BUDESONIDE 0.5 MG/2ML IN SUSP
0.5000 mg | Freq: Two times a day (BID) | RESPIRATORY_TRACT | Status: DC
  Administered 2023-02-06 – 2023-02-08 (×5): 0.5 mg via RESPIRATORY_TRACT
  Filled 2023-02-06 (×5): qty 2

## 2023-02-06 MED ORDER — HEPARIN SODIUM (PORCINE) 5000 UNIT/ML IJ SOLN: 5000.0000 [IU] | Freq: Three times a day (TID) | INTRAMUSCULAR | Status: DC

## 2023-02-06 MED ORDER — NOREPINEPHRINE 16 MG/250ML-% IV SOLN
0.0000 ug/min | INTRAVENOUS | Status: DC
  Administered 2023-02-06: 11 ug/min via INTRAVENOUS
  Filled 2023-02-06: qty 250

## 2023-02-06 MED ORDER — NOREPINEPHRINE 4 MG/250ML-% IV SOLN
INTRAVENOUS | Status: AC
  Filled 2023-02-06: qty 250

## 2023-02-06 MED ORDER — MAGNESIUM SULFATE 2 GM/50ML IV SOLN
2.0000 g | Freq: Once | INTRAVENOUS | Status: AC
  Administered 2023-02-06: 2 g via INTRAVENOUS
  Filled 2023-02-06: qty 50

## 2023-02-06 MED ORDER — LACTATED RINGERS IV BOLUS (SEPSIS): 1000.0000 mL | Freq: Once | INTRAVENOUS | Status: DC

## 2023-02-06 MED ORDER — NOREPINEPHRINE 4 MG/250ML-% IV SOLN
2.0000 ug/min | INTRAVENOUS | Status: DC
  Administered 2023-02-06: 15 ug/min via INTRAVENOUS

## 2023-02-06 MED ORDER — PANTOPRAZOLE SODIUM 40 MG PO TBEC: 40.0000 mg | DELAYED_RELEASE_TABLET | Freq: Every day | ORAL | Status: DC

## 2023-02-06 MED ORDER — ROSUVASTATIN CALCIUM 5 MG PO TABS
5.0000 mg | ORAL_TABLET | ORAL | Status: DC
  Administered 2023-02-06 – 2023-02-08 (×2): 5 mg via ORAL
  Filled 2023-02-06 (×2): qty 1

## 2023-02-06 MED ORDER — MONTELUKAST SODIUM 10 MG PO TABS
10.0000 mg | ORAL_TABLET | Freq: Every evening | ORAL | Status: DC
  Administered 2023-02-06 – 2023-02-07 (×2): 10 mg via ORAL
  Filled 2023-02-06 (×2): qty 1

## 2023-02-06 MED ORDER — POTASSIUM CHLORIDE 10 MEQ/100ML IV SOLN
10.0000 meq | INTRAVENOUS | Status: AC
  Administered 2023-02-06 (×2): 10 meq via INTRAVENOUS
  Filled 2023-02-06 (×2): qty 100

## 2023-02-06 MED ORDER — LACTATED RINGERS IV BOLUS (SEPSIS): 500.0000 mL | Freq: Once | INTRAVENOUS | Status: DC

## 2023-02-06 MED ORDER — ARFORMOTEROL TARTRATE 15 MCG/2ML IN NEBU
15.0000 ug | INHALATION_SOLUTION | Freq: Two times a day (BID) | RESPIRATORY_TRACT | Status: DC
  Administered 2023-02-06 – 2023-02-08 (×5): 15 ug via RESPIRATORY_TRACT
  Filled 2023-02-06 (×5): qty 2

## 2023-02-06 MED ORDER — DOCUSATE SODIUM 100 MG PO CAPS: 100.0000 mg | ORAL_CAPSULE | Freq: Two times a day (BID) | ORAL | Status: DC | PRN

## 2023-02-06 MED ORDER — SODIUM CHLORIDE 0.9 % IV SOLN
250.0000 mL | INTRAVENOUS | Status: AC
  Administered 2023-02-06: 250 mL via INTRAVENOUS

## 2023-02-06 MED ORDER — LACTATED RINGERS IV BOLUS (SEPSIS): 250.0000 mL | Freq: Once | INTRAVENOUS | Status: DC

## 2023-02-06 MED ORDER — LEVALBUTEROL HCL 0.63 MG/3ML IN NEBU
0.6300 mg | INHALATION_SOLUTION | RESPIRATORY_TRACT | Status: DC | PRN
  Administered 2023-02-06 – 2023-02-08 (×2): 0.63 mg via RESPIRATORY_TRACT
  Filled 2023-02-06 (×2): qty 3

## 2023-02-06 MED ORDER — POLYETHYLENE GLYCOL 3350 17 G PO PACK: 17.0000 g | PACK | Freq: Every day | ORAL | Status: DC | PRN

## 2023-02-06 MED ORDER — LEVALBUTEROL TARTRATE 45 MCG/ACT IN AERO: 1.0000 | INHALATION_SPRAY | RESPIRATORY_TRACT | Status: DC | PRN

## 2023-02-06 MED ORDER — LACTATED RINGERS IV SOLN: INTRAVENOUS | Status: DC

## 2023-02-06 MED ORDER — PANTOPRAZOLE SODIUM 40 MG IV SOLR
40.0000 mg | Freq: Two times a day (BID) | INTRAVENOUS | Status: DC
  Administered 2023-02-06 – 2023-02-07 (×4): 40 mg via INTRAVENOUS
  Filled 2023-02-06 (×4): qty 10

## 2023-02-06 MED ORDER — POTASSIUM CHLORIDE 10 MEQ/100ML IV SOLN
10.0000 meq | INTRAVENOUS | Status: AC
  Administered 2023-02-06 (×2): 10 meq via INTRAVENOUS
  Filled 2023-02-06: qty 100

## 2023-02-06 MED ORDER — ACETAMINOPHEN 325 MG PO TABS: 650.0000 mg | ORAL_TABLET | Freq: Four times a day (QID) | ORAL | Status: DC | PRN

## 2023-02-06 MED ORDER — ACETAMINOPHEN 10 MG/ML IV SOLN
1000.0000 mg | Freq: Four times a day (QID) | INTRAVENOUS | Status: AC
  Administered 2023-02-06 (×2): 1000 mg via INTRAVENOUS
  Filled 2023-02-06 (×4): qty 100

## 2023-02-06 MED ORDER — DM-GUAIFENESIN ER 30-600 MG PO TB12
1.0000 | ORAL_TABLET | Freq: Two times a day (BID) | ORAL | Status: DC | PRN
  Administered 2023-02-06 – 2023-02-08 (×2): 1 via ORAL
  Filled 2023-02-06 (×3): qty 1

## 2023-02-06 NOTE — Telephone Encounter (Signed)
Spoke with pt's daughter to give them clarification on the current care plan per Dr. Excell Seltzer. Pt's daughter informed us that the pt is on the way to the ED right now d/t BP being 60's SBP and 40's DBP. Pt's daughter told to call our office if she has any further questions.

## 2023-02-06 NOTE — Telephone Encounter (Signed)
Patient's daughter is calling the patient is very confused as to what is going on. Patient's daughter is requesting to speak with a nurse.

## 2023-02-06 NOTE — ED Triage Notes (Signed)
Pt took BP at home and it was 66/50. Pt takes bp meds even though blood pressures have been lower than usual. Denies CP but does c/o SOB. Pt has cough but no fever or chills.Pt lethargic in triage

## 2023-02-06 NOTE — H&P (Signed)
NAME:  Dana Horton, MRN:  [id number], DOB:  1945-09-21, LOS: 0 ADMISSION DATE:  02/06/2023, CONSULTATION DATE:  11/13 REFERRING MD:  Dr. Rhae Hammock, CHIEF COMPLAINT:  shock   History of Present Illness:  Patient is a 77 yo F w/ pertinent PMH Afib, chronic hypotension, severe MVR, chronic diastolic chf, CKD 3a, COPD, HTN, OSA on cpap presents to [facility] on 11/13 w/ hypotension.  Patient states recently medication switched to oral amio for afib. States does not tolerate it well because gives her nausea. Family states not eating/drinking well past few days because of this. Stated she has also been having a cough. Denies sob, chest pain, diarrhea, vomiting. Having worsening generalized weakness and checked BP at home w/ low BP. Came to [facility] ED on 11/13 for further workup.   On arrival patient weak but mentating well. BP 90/42 and afib w/ rate 110s. Afebrile but wbc 11. CXR personally reviewed w/ possible RLL infiltrate. Cultures obtained, given fluids, and started on rocephin/azithro. Despite fluids remained hypotensive and started on levo. PCCM consulted.  Pertinent  Medical History   Past Medical History:  Diagnosis Date   Atrial fibrillation with rapid ventricular response (HCC) 04/10/2017   Bladder tumor 08/06/2022   BMI 28.0-28.9,adult 05/21/2022   Bronchitis 03/07/2022   CHF (congestive heart failure) (HCC)    Chronic anticoagulation 06/10/2019   Eliquis   Chronic diastolic heart failure (HCC)    Chronic obstructive pulmonary disease (HCC) 05/21/2022   Cigarette smoker 05/21/2022   CKD (chronic kidney disease) stage 3, GFR 30-59 ml/min (HCC)    Demand ischemia (HCC) 09/25/2016   Normal coronaries Jan 2019 when admitted with AF with RVR and chest pain   Dysrhythmia    a-fib   Essential hypertension    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 06/29/2022   GERD (gastroesophageal reflux disease)    HCAP (healthcare-associated pneumonia) 04/10/2017    Headache    Heart murmur    History of bladder cancer    Hypercholesterolemia 05/21/2022   Iron deficiency anemia due to chronic blood loss 06/29/2022   Lung nodule    Myocardial infarction (HCC) 04/07/2017   NSTEMI   OSA on CPAP 04/08/2017   Osteopenia 05/21/2022   Osteoporosis    PAF (paroxysmal atrial fibrillation) (HCC) 09/24/2016   CHADS2 vasc=3   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   S/p successful transcatheter edge-to-edge mitral valve repair with one Mitral Clip NT positioned A3/P3 position   Sleep apnea    cpap  setting at 2.5 per patient    Stage 3a chronic kidney disease (HCC) 05/21/2022   Symptomatic anemia 08/07/2022   Tobacco abuse      Significant Hospital Events: Including procedures, antibiotic start and stop dates in addition to other pertinent events   11/13 admitted to [facility] hypotension on levo  Interim History / Subjective:  See above  Objective   Blood pressure (!) 79/37, pulse (!) 108, temperature 98 F (36.7 C), temperature source Oral, resp. rate (!) 22, height 5\' 3"  (1.6 m), weight 54.9 kg, SpO2 90%.        Intake/Output Summary (Last 24 hours) at 02/06/2023 1353 Last data filed at 02/06/2023 1344 Gross per 24 hour  Intake 6.45 ml  Output --  Net 6.45 ml   Filed Weights   02/06/23 1136  Weight: 54.9 kg    Examination: General: elderly female in NAD HEENT: MM pink/moist; ETT in place Neuro: Aox3; MAE CV: s1s2, afib rate 100s, no m/r/g PULM:  dim  clear BS bilaterally; RA GI: soft, bsx4 active  Extremities: warm/dry, no edema  Skin: no rashes or lesions    Resolved Hospital Problem list     Assessment & Plan:  Shock: likely sepsis 2/2 to pna Possible RLL pna Plan: -admit to icu -cont levo for map goal >65 -given iv fluids -trend LA -cont home midodrine -cont rocephin/azithro for possible cap -follow cultures; check expectorated sputum, ua, rvp, pct, ur legionella/strep -trend wbc/fever curve  Chronic  diastolic CHF Severe MVR s/p mitraclip Severe aortic insufficeincy HLD Plan: -hold GDMT while hypotensive -daily weights; strict I/o's -statin  PAF -has been on flecainide in past and switched to amio; not tolerating well due to nausea Plan: -tele monitoring -cont amio -may need to f/u w/ cardiology to consider other alternatives  COPD Chronic respiratory failure w/ hypoxia Plan: -cont [place] for sats >92% -pulmicort/brovana scheduled -prn xopenex  CKD 3a Hypokalemia Plan: -replete K and check mag -Trend BMP / urinary output -Replace electrolytes as indicated -Avoid nephrotoxic agents, ensure adequate renal perfusion  Acute on chronic anemia Plan: -patient denies any GIB -hold home eliquis for now; check cbc in am and consider restarting if stable -hx of gerd; protonix bid -trend cbc  OSA on CPAP Plan: -cpap qhs  Pulm nodule Tobacco abuse Plan: -cessation counseling -f/u repeat CT in 12 months outpt  Peripheral neuropathy Plan: -hold home lyrica  Best Practice (right click and "Reselect all SmartList Selections" daily)   Diet/type: clear liquids DVT prophylaxis: SCD given low hgb; consider restarting home eliquis tomorrow if hgb stable GI prophylaxis: PPI Lines: N/A Foley:  N/A Code Status:  DNR Last date of multidisciplinary goals of care discussion [11/13 spoke with daughter over phone. States last time they spoke with doctor patient made decision to be DNR/DNI. She states she would be okay w/ central line if needed.]  Labs   CBC: Recent Labs  Lab 02/04/23 1420 02/06/23 1158  WBC 8.2 11.0*  NEUTROABS 6.5 9.3*  HGB 8.6* 7.6*  HCT 28.0* 25.2*  MCV 95.6 95.8  PLT 147* 145*    Basic Metabolic Panel: Recent Labs  Lab 02/04/23 1420 02/06/23 1158  NA 137 134*  K 3.7 3.4*  CL 102 101  CO2 27 22  GLUCOSE 92 130*  BUN 21 23  CREATININE 1.12* 1.38*  CALCIUM 9.2 8.6*   GFR: Estimated Creatinine Clearance: 28.7 mL/min (A) (by C-G formula  based on SCr of 1.38 mg/dL (H)). Recent Labs  Lab 02/04/23 1420 02/06/23 1158  WBC 8.2 11.0*    Liver Function Tests: Recent Labs  Lab 02/04/23 1420 02/06/23 1158  AST 16 14*  ALT 10 10  ALKPHOS 73 65  BILITOT 0.8 1.0  PROT 6.7 6.1*  ALBUMIN 2.7* 2.3*   No results for input(s): "LIPASE", "AMYLASE" in the last 168 hours. No results for input(s): "AMMONIA" in the last 168 hours.  ABG    Component Value Date/Time   PHART 7.413 01/02/2023 1548   PCO2ART 43.4 01/02/2023 1548   PO2ART 30 (LL) 01/02/2023 1548   HCO3 26.0 01/16/2023 0950   TCO2 28 01/16/2023 0950   ACIDBASEDEF 1.0 01/16/2023 0950   O2SAT 78 01/16/2023 0950     Coagulation Profile: Recent Labs  Lab 02/06/23 1300  INR 2.5*    Cardiac Enzymes: No results for input(s): "CKTOTAL", "CKMB", "CKMBINDEX", "TROPONINI" in the last 168 hours.  HbA1C: No results found for: "HGBA1C"  CBG: No results for input(s): "GLUCAP" in the last 168 hours.  Review of  Systems:     Past Medical History:  She,  has a past medical history of Atrial fibrillation with rapid ventricular response (HCC) (04/10/2017), Bladder tumor (08/06/2022), BMI 28.0-28.9,adult (05/21/2022), Bronchitis (03/07/2022), CHF (congestive heart failure) (HCC), Chronic anticoagulation (06/10/2019), Chronic diastolic heart failure (HCC), Chronic obstructive pulmonary disease (HCC) (05/21/2022), Cigarette smoker (05/21/2022), CKD (chronic kidney disease) stage 3, GFR 30-59 ml/min (HCC), Demand ischemia (HCC) (09/25/2016), Dysrhythmia, Essential hypertension, 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 (06/29/2022), GERD (gastroesophageal reflux disease), HCAP (healthcare-associated pneumonia) (04/10/2017), Headache, Heart murmur, History of bladder cancer, Hypercholesterolemia (05/21/2022), Iron deficiency anemia due to chronic blood loss (06/29/2022), Lung nodule, Myocardial infarction (HCC) (04/07/2017), OSA on CPAP (04/08/2017),  Osteopenia (05/21/2022), Osteoporosis, PAF (paroxysmal atrial fibrillation) (HCC) (09/24/2016), S/P mitral valve clip implantation (01/16/2023), Sleep apnea, Stage 3a chronic kidney disease (HCC) (05/21/2022), Symptomatic anemia (08/07/2022), and Tobacco abuse.   Surgical History:   Past Surgical History:  Procedure Laterality Date   ABDOMINAL HYSTERECTOMY     BLADDER SURGERY     COLONOSCOPY N/A 12/28/2016   Procedure: COLONOSCOPY;  Surgeon: Sherrilyn Rist, MD;  Location: [facility] ENDOSCOPY;  Service: Gastroenterology;  Laterality: N/A;   LEFT HEART CATH AND CORONARY ANGIOGRAPHY N/A 04/09/2017   Procedure: LEFT HEART CATH AND CORONARY ANGIOGRAPHY;  Surgeon: Corky Crafts, MD;  Location: [facility] INVASIVE CV LAB;  Service: Cardiovascular;  Laterality: N/A;   MOUTH SURGERY     POLYPECTOMY N/A 12/28/2016   Procedure: POLYPECTOMY with injection of Elevue;  Surgeon: Sherrilyn Rist, MD;  Location: [facility] ENDOSCOPY;  Service: Gastroenterology;  Laterality: N/A;   RIGHT/LEFT HEART CATH AND CORONARY ANGIOGRAPHY N/A 01/02/2023   Procedure: RIGHT/LEFT HEART CATH AND CORONARY ANGIOGRAPHY;  Surgeon: Iran Ouch, MD;  Location: [facility] INVASIVE CV LAB;  Service: Cardiovascular;  Laterality: N/A;   TEE WITHOUT CARDIOVERSION N/A 01/01/2023   Procedure: TRANSESOPHAGEAL ECHOCARDIOGRAM;  Surgeon: Jodelle Red, MD;  Location: [facility] INVASIVE CV LAB;  Service: Cardiovascular;  Laterality: N/A;   TRANSCATHETER MITRAL EDGE TO EDGE REPAIR N/A 01/16/2023   Procedure: TRANSCATHETER MITRAL EDGE TO EDGE REPAIR;  Surgeon: Tonny Bollman, MD;  Location: [facility] INVASIVE CV LAB;  Service: Cardiovascular;  Laterality: N/A;   TRANSURETHRAL RESECTION OF BLADDER TUMOR N/A 08/09/2022   Procedure: TRANSURETHRAL RESECTION OF BLADDER TUMOR (TURBT) BILATERAL RETROGRADES;  Surgeon: Loletta Parish., MD;  Location: [facility] ORS;  Service: Urology;  Laterality: N/A;   TRANSURETHRAL RESECTION OF BLADDER TUMOR N/A 10/05/2022   Procedure:  RESTAGING TRANSURETHRAL RESECTION OF BLADDER TUMOR (TURBT), CYSTOSCOPY, BILATERAL RETROGRADE PYELOGRAM;  Surgeon: Loletta Parish., MD;  Location: [facility] ORS;  Service: Urology;  Laterality: N/A;  1 HR     Social History:   reports that she has been smoking cigarettes. She has a 26 pack-year smoking history. She has never used smokeless tobacco. She reports that she does not drink alcohol and does not use drugs.   Family History:  Her family history includes Arrhythmia in her father; Bladder Cancer in her brother; CAD in her father; Cancer in her father; Heart disease in her father; Hypertension in her mother; Lung cancer in her brother; Stroke in her mother. There is no history of Colon cancer, Esophageal cancer, Stomach cancer, or Colon polyps.   Allergies Allergies  Allergen Reactions   Albuterol Palpitations   Lisinopril Cough     Home Medications  Prior to Admission medications   Medication Sig Start Date End Date Taking? Authorizing Provider  acetaminophen (TYLENOL) 500 MG tablet Take 500 mg by mouth  every 6 (six) hours as needed for moderate pain or headache.    [provider]  amiodarone (PACERONE) 100 MG tablet Take 1 tablet (100 mg total) by mouth daily. 01/18/23   Filbert Schilder, NP  apixaban (ELIQUIS) 5 MG TABS tablet Take 1 tablet (5 mg total) by mouth 2 (two) times daily. 08/12/22   Azucena Fallen, MD  docusate sodium (COLACE) 100 MG capsule Take 100 mg by mouth daily.    [provider]  empagliflozin (JARDIANCE) 10 MG TABS tablet Take 1 tablet (10 mg total) by mouth daily. 01/06/23   Zannie Cove, MD  Ferric Maltol 30 MG CAPS Take 1 capsule (30 mg total) by mouth daily. 12/24/22   Crist Fat, MD  fluticasone [facility]) 50 MCG/ACT nasal spray Place 2 sprays into both nostrils daily. 03/07/22   Crist Fat, MD  furosemide (LASIX) 40 MG tablet Take 1 tablet (40 mg total) by mouth daily. 01/06/23   Zannie Cove, MD  levalbuterol [facility]  HFA) 45 MCG/ACT inhaler Inhale 1-2 puffs into the lungs every 4 (four) hours as needed for wheezing. Patient taking differently: Inhale 1-2 puffs into the lungs 2 (two) times daily. 05/21/22   Crist Fat, MD  metoprolol tartrate (LOPRESSOR) 25 MG tablet Take 0.5 tablets (12.5 mg total) by mouth 2 (two) times daily. 01/06/23   Zannie Cove, MD  midodrine (PROAMATINE) 10 MG tablet Take 1 tablet (10 mg total) by mouth 2 (two) times daily with a meal. 01/22/23   Leonia Reader, Barbara Cower, MD  montelukast (SINGULAIR) 10 MG tablet Take 1 tablet (10 mg total) by mouth every evening. 08/06/22   Crist Fat, MD  ondansetron (ZOFRAN) 4 MG tablet Take 1 tablet (4 mg total) by mouth daily as needed for nausea or vomiting. 01/11/23 01/11/24  Crist Fat, MD  pantoprazole (PROTONIX) 40 MG tablet Take 1 tablet (40 mg total) by mouth daily. 01/22/23   Crist Fat, MD  polyethylene glycol (MIRALAX / GLYCOLAX) 17 g packet Take 17 g by mouth daily as needed. 01/06/23   Zannie Cove, MD  potassium chloride SA (KLOR-CON M) 20 MEQ tablet Take 1 tablet (20 mEq total) by mouth daily. 01/06/23   Zannie Cove, MD  pregabalin (LYRICA) 50 MG capsule TAKE ONE CAPSULE BY MOUTH TWICE DAILY 01/16/23   Leonia Reader, Barbara Cower, MD  rosuvastatin (CRESTOR) 5 MG tablet Take 5 mg by mouth every other day. In the morning    [provider]  SYMBICORT 160-4.5 MCG/ACT inhaler Inhale 2 puffs into the lungs 2 (two) times daily. 05/21/22   Crist Fat, MD  traZODone (DESYREL) 50 MG tablet Take 0.5 tablets (25 mg total) by mouth at bedtime as needed for sleep. 01/22/23   Crist Fat, MD     Critical care time: 45 minutes    JD Anselm Lis [facility] 02/06/2023, 1:58 PM  Please see [email or web address] for pager details.  From 7A-7P if no response, please call [phone]. After hours, please call ELink [phone].

## 2023-02-06 NOTE — Progress Notes (Signed)
   02/06/23 2230  BiPAP/CPAP/SIPAP  $ Non-Invasive Ventilator  Non-Invasive Vent Set Up  BiPAP/CPAP/SIPAP Pt Type Adult  BiPAP/CPAP/SIPAP DREAMSTATIOND  Mask Type Full face mask  Mask Size Medium  Flow Rate 6 lpm  CPAP 6 cmH2O  Patient Home Equipment No  BiPAP/CPAP /SiPAP Vitals  Pulse Rate (!) 112  Resp 18  BP 122/65  SpO2 95 %  MEWS Score/Color  MEWS Score 2  MEWS Score Color Yellow

## 2023-02-06 NOTE — ED Provider Notes (Incomplete)
[place] EMERGENCY DEPARTMENT AT [facility] Provider Note   CSN: [id number] Arrival date & time: 02/06/23  1114     History {Add pertinent medical, surgical, social history, OB history to HPI:1} Chief Complaint  Patient presents with   Hypotension    Dana Horton is a 77 y.o. female.  77 year old female with past medical history of atrial fibrillation and mitral valve prolapse presenting to the emergency department today with fatigue and hypotension.  The patient was feeling unwell this morning and checked her blood pressure and it was low.  She came to the ER at that time for further evaluation.  The patient reports that she has been coughing a lot over the past few days.  She also is having some nasal congestion.  She was brought to the emergency department today for further evaluation regarding this.  The patient reports that during her most recent hospitalization that she was found to be hypotensive.  Looks like she was started on midodrine.  She has been taking this as prescribed.  She brought to the ER today for further evaluation she denies any fevers.  Denies any blood in her stool or dark stools.  Denies any abdominal pain or urinary symptoms.        Home Medications Prior to Admission medications   Medication Sig Start Date End Date Taking? Authorizing Provider  acetaminophen (TYLENOL) 500 MG tablet Take 500 mg by mouth every 6 (six) hours as needed for moderate pain or headache.    [provider]  amiodarone (PACERONE) 100 MG tablet Take 1 tablet (100 mg total) by mouth daily. 01/18/23   Filbert Schilder, NP  apixaban (ELIQUIS) 5 MG TABS tablet Take 1 tablet (5 mg total) by mouth 2 (two) times daily. 08/12/22   Azucena Fallen, MD  docusate sodium (COLACE) 100 MG capsule Take 100 mg by mouth daily.    [provider]  empagliflozin (JARDIANCE) 10 MG TABS tablet Take 1 tablet (10 mg total) by mouth daily. 01/06/23    Zannie Cove, MD  Ferric Maltol 30 MG CAPS Take 1 capsule (30 mg total) by mouth daily. 12/24/22   Crist Fat, MD  fluticasone [facility]) 50 MCG/ACT nasal spray Place 2 sprays into both nostrils daily. 03/07/22   Crist Fat, MD  furosemide (LASIX) 40 MG tablet Take 1 tablet (40 mg total) by mouth daily. 01/06/23   Zannie Cove, MD  levalbuterol [facility] HFA) 45 MCG/ACT inhaler Inhale 1-2 puffs into the lungs every 4 (four) hours as needed for wheezing. Patient taking differently: Inhale 1-2 puffs into the lungs 2 (two) times daily. 05/21/22   Crist Fat, MD  metoprolol tartrate (LOPRESSOR) 25 MG tablet Take 0.5 tablets (12.5 mg total) by mouth 2 (two) times daily. 01/06/23   Zannie Cove, MD  midodrine (PROAMATINE) 10 MG tablet Take 1 tablet (10 mg total) by mouth 2 (two) times daily with a meal. 01/22/23   Leonia Reader, Barbara Cower, MD  montelukast (SINGULAIR) 10 MG tablet Take 1 tablet (10 mg total) by mouth every evening. 08/06/22   Crist Fat, MD  ondansetron (ZOFRAN) 4 MG tablet Take 1 tablet (4 mg total) by mouth daily as needed for nausea or vomiting. 01/11/23 01/11/24  Crist Fat, MD  pantoprazole (PROTONIX) 40 MG tablet Take 1 tablet (40 mg total) by mouth daily. 01/22/23   Crist Fat, MD  polyethylene glycol (MIRALAX / GLYCOLAX) 17 g packet Take 17 g by mouth daily  as needed. 01/06/23   Zannie Cove, MD  potassium chloride SA (KLOR-CON M) 20 MEQ tablet Take 1 tablet (20 mEq total) by mouth daily. 01/06/23   Zannie Cove, MD  pregabalin (LYRICA) 50 MG capsule TAKE ONE CAPSULE BY MOUTH TWICE DAILY 01/16/23   Leonia Reader, Barbara Cower, MD  rosuvastatin (CRESTOR) 5 MG tablet Take 5 mg by mouth every other day. In the morning    [provider]  SYMBICORT 160-4.5 MCG/ACT inhaler Inhale 2 puffs into the lungs 2 (two) times daily. 05/21/22   Crist Fat, MD  traZODone (DESYREL) 50 MG tablet Take 0.5 tablets (25 mg total) by mouth at bedtime as needed for sleep. 01/22/23    Crist Fat, MD      Allergies    Albuterol and Lisinopril    Review of Systems   Review of Systems  Respiratory:  Positive for cough.   Neurological:  Positive for light-headedness.  All other systems reviewed and are negative.   Physical Exam Updated Vital Signs BP (!) 90/42   Pulse (!) 112   Temp 98 F (36.7 C) (Oral)   Resp 18   Ht 5\' 3"  (1.6 m)   Wt 54.9 kg   LMP  (LMP Unknown)   SpO2 99%   BMI 21.44 kg/m  Physical Exam Vitals and nursing note reviewed.   Gen: NAD Eyes: PERRL, EOMI HEENT: no oropharyngeal swelling Neck: trachea midline Resp: Diminished to bilateral lung bases Card: Tachycardic, irregular, no murmurs, rubs, or gallops Abd: nontender, nondistended Extremities: no calf tenderness, no edema Neuro: No focal deficits Vascular: 2+ radial pulses bilaterally, 2+ DP pulses bilaterally Skin: no rashes Psyc: acting appropriately   ED Results / Procedures / Treatments   Labs (all labs ordered are listed, but only abnormal results are displayed) Labs Reviewed  RESP PANEL BY RT-PCR (RSV, FLU A&B, COVID)  RVPGX2  CULTURE, BLOOD (ROUTINE X 2)  CULTURE, BLOOD (ROUTINE X 2)  COMPREHENSIVE METABOLIC PANEL  CBC WITH DIFFERENTIAL/PLATELET  PROTIME-INR  APTT  URINALYSIS, W/ REFLEX TO CULTURE (INFECTION SUSPECTED)  I-STAT CG4 LACTIC ACID, ED    EKG EKG Interpretation Date/Time:  Wednesday February 06 2023 11:40:59 EST Ventricular Rate:  122 PR Interval:    QRS Duration:  108 QT Interval:  362 QTC Calculation: 515 R Axis:   68  Text Interpretation: Atrial fibrillation with rapid ventricular response Nonspecific ST and T wave abnormality Abnormal ECG Confirmed by Gerhard Munch [phone]) on 02/06/2023 11:46:11 AM  Radiology No results found.  Procedures Procedures  {Document cardiac monitor, telemetry assessment procedure when appropriate:1}  Medications Ordered in ED Medications  lactated ringers infusion (has no administration in time  range)  cefTRIAXone (ROCEPHIN) 2 g in sodium chloride 0.9 % 100 mL IVPB (has no administration in time range)  azithromycin (ZITHROMAX) 500 mg in dextrose 5 % 250 mL IVPB (has no administration in time range)  lactated ringers bolus 1,000 mL (has no administration in time range)    And  lactated ringers bolus 500 mL (has no administration in time range)    And  lactated ringers bolus 250 mL (has no administration in time range)    ED Course/ Medical Decision Making/ A&P   {   Click here for ABCD2, HEART and other calculatorsREFRESH Note before signing :1}                              Medical Decision Making 77 year old female  with past medical history of atrial fibrillation and mitral valve prolapse presenting to the emergency department today with concern for hypotension and sepsis potentially.  I will cover the patient with coverage for community-acquired pneumonia given her history of cough.  I will give the patient a liter of fluid to start.  It seems that the hypotension is more of a chronic issue for her.  She states that she did take her morning medication just prior to arrival which should include the midodrine.  I will reevaluate after the fluids to see if she improves with the liter as does appear that she has some diastolic dysfunction on her echocardiogram to prevent volume overload especially if the hypotension is more of a chronic issue for her.  The patient does have a mild leukocytosis.  Her blood pressure remained low despite the initial IV fluid bolus.  She is given additional IV fluids which will be 30 mL/kg and actually little more.  The patient's heart rate has improved to the 90s at this time.  A call is placed to the ICU to discuss management.  Peripheral Levophed is ordered.  Given her heart rate improving to the 90s we will hold off on cardioversion at this time.  Amount and/or Complexity of Data Reviewed Labs: ordered. Radiology: ordered. ECG/medicine tests:  ordered.  Risk Prescription drug management.   ***  {Document critical care time when appropriate:1} {Document review of labs and clinical decision tools ie heart score, Chads2Vasc2 etc:1}  {Document your independent review of radiology images, and any outside records:1} {Document your discussion with family members, caretakers, and with consultants:1} {Document social determinants of health affecting pt's care:1} {Document your decision making why or why not admission, treatments were needed:1} Final Clinical Impression(s) / ED Diagnoses Final diagnoses:  None    Rx / DC Orders ED Discharge Orders     None

## 2023-02-06 NOTE — ED Notes (Signed)
EDP notified of hypotension. See new orders

## 2023-02-06 NOTE — ED Notes (Signed)
Patient in trendelenburg with 2nd fluid bolus going. EDP notified.

## 2023-02-06 NOTE — Sepsis Progress Note (Signed)
Message sent to bedside RN Grover Canavan inquiring about the status of the lactic acid. It appears that it was drawn as an iSTAT at 1200, but never resulted. Grover Canavan stated that she would inquire with lab about the test.

## 2023-02-06 NOTE — Procedures (Signed)
Central Venous Catheter Insertion Procedure Note  Dana Horton  [id number]  [birth year]  Date:02/06/23  Time:3:48 PM   Provider Performing:Delsie Amador Bea Laura  Cherlynn Polo   Procedure: Insertion of Non-tunneled Central Venous [phone]) with US guidance ([id number])   Indication(s) Medication administration  Consent Risks of the procedure as well as the alternatives and risks of each were explained to the patient and/or caregiver.  Consent for the procedure was obtained and is signed in the bedside chart  Anesthesia Topical only with 1% lidocaine   Timeout Verified patient identification, verified procedure, site/side was marked, verified correct patient position, special equipment/implants available, medications/allergies/relevant history reviewed, required imaging and test results available.  Sterile Technique Maximal sterile technique including full sterile barrier drape, hand hygiene, sterile gown, sterile gloves, mask, hair covering, sterile ultrasound probe cover (if used).  Procedure Description Area of catheter insertion was cleaned with chlorhexidine and draped in sterile fashion.  With real-time ultrasound guidance a central venous catheter was placed into the left internal jugular vein. Nonpulsatile blood flow and easy flushing noted in all ports.  The catheter was sutured in place and sterile dressing applied.  Complications/Tolerance None; patient tolerated the procedure well. Chest X-ray is ordered to verify placement for internal jugular or subclavian cannulation.   Chest x-ray is not ordered for femoral cannulation.  EBL Minimal  Specimen(s) None   Under direct supervision of JD Suzie Portela, NP

## 2023-02-06 NOTE — Sepsis Progress Note (Signed)
eLink is following this Code Sepsis. °

## 2023-02-06 NOTE — Telephone Encounter (Signed)
Patient spouse is aware of scheduled appointment times/dates

## 2023-02-07 ENCOUNTER — Inpatient Hospital Stay (HOSPITAL_COMMUNITY): Payer: Medicare Other

## 2023-02-07 DIAGNOSIS — I340 Nonrheumatic mitral (valve) insufficiency: Secondary | ICD-10-CM | POA: Diagnosis not present

## 2023-02-07 DIAGNOSIS — A419 Sepsis, unspecified organism: Secondary | ICD-10-CM | POA: Diagnosis not present

## 2023-02-07 DIAGNOSIS — R570 Cardiogenic shock: Secondary | ICD-10-CM | POA: Diagnosis not present

## 2023-02-07 DIAGNOSIS — R6521 Severe sepsis with septic shock: Secondary | ICD-10-CM | POA: Diagnosis not present

## 2023-02-07 DIAGNOSIS — I4819 Other persistent atrial fibrillation: Secondary | ICD-10-CM | POA: Diagnosis not present

## 2023-02-07 DIAGNOSIS — I351 Nonrheumatic aortic (valve) insufficiency: Secondary | ICD-10-CM

## 2023-02-07 DIAGNOSIS — J810 Acute pulmonary edema: Secondary | ICD-10-CM | POA: Diagnosis not present

## 2023-02-07 DIAGNOSIS — J9621 Acute and chronic respiratory failure with hypoxia: Secondary | ICD-10-CM | POA: Diagnosis not present

## 2023-02-07 LAB — EXPECTORATED SPUTUM ASSESSMENT W GRAM STAIN, RFLX TO RESP C

## 2023-02-07 LAB — CBC
HCT: 26.8 % — ABNORMAL LOW (ref 36.0–46.0)
Hemoglobin: 8.3 g/dL — ABNORMAL LOW (ref 12.0–15.0)
MCH: 29.5 pg (ref 26.0–34.0)
MCHC: 31 g/dL (ref 30.0–36.0)
MCV: 95.4 fL (ref 80.0–100.0)
Platelets: 213 10*3/uL (ref 150–400)
RBC: 2.81 MIL/uL — ABNORMAL LOW (ref 3.87–5.11)
RDW: 18 % — ABNORMAL HIGH (ref 11.5–15.5)
WBC: 16.4 10*3/uL — ABNORMAL HIGH (ref 4.0–10.5)
nRBC: 0.1 % (ref 0.0–0.2)

## 2023-02-07 LAB — ECHOCARDIOGRAM COMPLETE
AR max vel: 2.09 cm2
AV Area VTI: 2.18 cm2
AV Area mean vel: 1.98 cm2
AV Mean grad: 6 mm[Hg]
AV Peak grad: 10.8 mm[Hg]
Ao pk vel: 1.64 m/s
Area-P 1/2: 5.02 cm2
Height: 63 in
MV M vel: 5.12 m/s
MV Peak grad: 104.9 mm[Hg]
S' Lateral: 2.5 cm
Weight: 2148.16 [oz_av]

## 2023-02-07 LAB — BASIC METABOLIC PANEL
Anion gap: 8 (ref 5–15)
BUN: 22 mg/dL (ref 8–23)
CO2: 19 mmol/L — ABNORMAL LOW (ref 22–32)
Calcium: 7.7 mg/dL — ABNORMAL LOW (ref 8.9–10.3)
Chloride: 101 mmol/L (ref 98–111)
Creatinine, Ser: 1.34 mg/dL — ABNORMAL HIGH (ref 0.44–1.00)
GFR, Estimated: 41 mL/min — ABNORMAL LOW (ref >60)
Glucose, Bld: 132 mg/dL — ABNORMAL HIGH (ref 70–99)
Potassium: 4.1 mmol/L (ref 3.5–5.1)
Sodium: 128 mmol/L — ABNORMAL LOW (ref 135–145)

## 2023-02-07 LAB — GLUCOSE, CAPILLARY
Glucose-Capillary: 118 mg/dL — ABNORMAL HIGH (ref 70–99)
Glucose-Capillary: 104 mg/dL — ABNORMAL HIGH (ref 70–99)
Glucose-Capillary: 114 mg/dL — ABNORMAL HIGH (ref 70–99)
Glucose-Capillary: 77 mg/dL (ref 70–99)
Glucose-Capillary: 90 mg/dL (ref 70–99)

## 2023-02-07 LAB — RESPIRATORY PANEL BY PCR

## 2023-02-07 LAB — BRAIN NATRIURETIC PEPTIDE: B Natriuretic Peptide: 1576.2 pg/mL — ABNORMAL HIGH (ref 0.0–100.0)

## 2023-02-07 LAB — MAGNESIUM: Magnesium: 2.8 mg/dL — ABNORMAL HIGH (ref 1.7–2.4)

## 2023-02-07 MED ORDER — AMIODARONE HCL IN DEXTROSE 360-4.14 MG/200ML-% IV SOLN
60.0000 mg/h | INTRAVENOUS | Status: AC
  Administered 2023-02-07 (×2): 60 mg/h via INTRAVENOUS
  Filled 2023-02-07 (×2): qty 200

## 2023-02-07 MED ORDER — HEPARIN (PORCINE) 25000 UT/250ML-% IV SOLN
700.0000 [IU]/h | INTRAVENOUS | Status: DC
  Administered 2023-02-07: 700 [IU]/h via INTRAVENOUS
  Filled 2023-02-07 (×2): qty 250

## 2023-02-07 MED ORDER — AMIODARONE HCL IN DEXTROSE 360-4.14 MG/200ML-% IV SOLN
60.0000 mg/h | INTRAVENOUS | Status: DC
  Administered 2023-02-08: 60 mg/h via INTRAVENOUS
  Filled 2023-02-07: qty 200

## 2023-02-07 MED ORDER — HEPARIN SODIUM (PORCINE) 5000 UNIT/ML IJ SOLN
5000.0000 [IU] | Freq: Three times a day (TID) | INTRAMUSCULAR | Status: DC
  Administered 2023-02-07: 5000 [IU] via SUBCUTANEOUS
  Filled 2023-02-07: qty 1

## 2023-02-07 MED ORDER — MELATONIN 3 MG PO TABS
3.0000 mg | ORAL_TABLET | Freq: Every evening | ORAL | Status: DC | PRN
  Administered 2023-02-07: 3 mg via ORAL
  Filled 2023-02-07: qty 1

## 2023-02-07 MED ORDER — FUROSEMIDE 10 MG/ML IJ SOLN
INTRAMUSCULAR | Status: AC
  Filled 2023-02-07: qty 4

## 2023-02-07 MED ORDER — FUROSEMIDE 10 MG/ML IJ SOLN
20.0000 mg | Freq: Once | INTRAMUSCULAR | Status: AC
  Administered 2023-02-07: 20 mg via INTRAVENOUS
  Filled 2023-02-07: qty 2

## 2023-02-07 MED ORDER — FUROSEMIDE 10 MG/ML IJ SOLN
40.0000 mg | Freq: Once | INTRAMUSCULAR | Status: AC
  Administered 2023-02-07: 40 mg via INTRAVENOUS

## 2023-02-07 NOTE — Progress Notes (Signed)
Patient desat to <88% despite increase increase in oxygen via [place]. Patient mouth breathing but refusing CPAP. Attempted venturi mask up to 14L  FiO2 55% without correction in sats. Patient placed on 15L NRB with recovery in sats to >92%. Elink contacted. Patient is coarse bilaterally with wet cough. Awaiting further orders.

## 2023-02-07 NOTE — Progress Notes (Signed)
eLink Physician-Brief Progress Note Patient Name: Tichina Jobst Pickron DOB: 26-Feb-1946 MRN: [id number]   Date of Service  02/07/2023  HPI/Events of Note  Patient with desaturation requiring placement on a non re-breather mask, saturation now 93 %. Bedside RN reports coarse breath sounds to auscultation.  eICU Interventions  Will check a CXR and BNP.        Thomasene Lot Kyo Cocuzza 02/07/2023, 3:05 AM

## 2023-02-07 NOTE — Plan of Care (Signed)
  Problem: Education: Goal: Knowledge of General Education information will improve Description: Including pain rating scale, medication(s)/side effects and non-pharmacologic comfort measures Outcome: Progressing   Problem: Clinical Measurements: Goal: Will remain free from infection Outcome: Progressing Goal: Respiratory complications will improve Outcome: Not Progressing Goal: Cardiovascular complication will be avoided Outcome: Progressing   Problem: Activity: Goal: Risk for activity intolerance will decrease Outcome: Not Progressing   Problem: Nutrition: Goal: Adequate nutrition will be maintained Outcome: Progressing

## 2023-02-07 NOTE — Progress Notes (Signed)
NAME:  Dana Horton, MRN:  [id number], DOB:  [birth year], LOS: 1 ADMISSION DATE:  02/06/2023, CONSULTATION DATE:  11/13 REFERRING MD:  Dr. Rhae Hammock, CHIEF COMPLAINT:  shock   History of Present Illness:  Patient is a 77 yo F w/ pertinent PMH Afib, chronic hypotension, severe MVR, chronic diastolic chf, CKD 3a, COPD, HTN, OSA on cpap presents to [facility] on 11/13 w/ hypotension.  Patient states recently medication switched to oral amio for afib. States does not tolerate it well because gives her nausea. Family states not eating/drinking well past few days because of this. Stated she has also been having a cough. Denies sob, chest pain, diarrhea, vomiting. Having worsening generalized weakness and checked BP at home w/ low BP. Came to [facility] ED on 11/13 for further workup.   On arrival patient weak but mentating well. BP 90/42 and afib w/ rate 110s. Afebrile but wbc 11. CXR personally reviewed w/ possible RLL infiltrate. Cultures obtained, given fluids, and started on rocephin/azithro. Despite fluids remained hypotensive and started on levo. PCCM consulted.  Pertinent  Medical History   Past Medical History:  Diagnosis Date   Atrial fibrillation with rapid ventricular response (HCC) 04/10/2017   Bladder tumor 08/06/2022   BMI 28.0-28.9,adult 05/21/2022   Bronchitis 03/07/2022   CHF (congestive heart failure) (HCC)    Chronic anticoagulation 06/10/2019   Eliquis   Chronic diastolic heart failure (HCC)    Chronic obstructive pulmonary disease (HCC) 05/21/2022   Cigarette smoker 05/21/2022   CKD (chronic kidney disease) stage 3, GFR 30-59 ml/min (HCC)    Demand ischemia (HCC) 09/25/2016   Normal coronaries Jan 2019 when admitted with AF with RVR and chest pain   Dysrhythmia    a-fib   Essential hypertension    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 06/29/2022   GERD (gastroesophageal reflux disease)    HCAP (healthcare-associated pneumonia) 04/10/2017    Headache    Heart murmur    History of bladder cancer    Hypercholesterolemia 05/21/2022   Iron deficiency anemia due to chronic blood loss 06/29/2022   Lung nodule    Myocardial infarction (HCC) 04/07/2017   NSTEMI   OSA on CPAP 04/08/2017   Osteopenia 05/21/2022   Osteoporosis    PAF (paroxysmal atrial fibrillation) (HCC) 09/24/2016   CHADS2 vasc=3   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   S/p successful transcatheter edge-to-edge mitral valve repair with one Mitral Clip NT positioned A3/P3 position   Sleep apnea    cpap  setting at 2.5 per patient    Stage 3a chronic kidney disease (HCC) 05/21/2022   Symptomatic anemia 08/07/2022   Tobacco abuse      Significant Hospital Events: Including procedures, antibiotic start and stop dates in addition to other pertinent events   11/13 admitted to [facility] hypotension on levo 11/14 remains on pressors  Interim History / Subjective:  No overnight events  Feels a little bit better Still coughing up a little bit of blood Denies any chest pains or chest discomfort at present  Objective   Blood pressure 111/61, pulse (!) 126, temperature (!) 97.3 F (36.3 C), temperature source Axillary, resp. rate (!) 26, height 5\' 3"  (1.6 m), weight 60.9 kg, SpO2 97%. CVP:  [5 mmHg-12 mmHg] 7 mmHg  Vent Mode: BIPAP;PCV FiO2 (%):  [55 %-100 %] 100 % Set Rate:  [12 bmp] 12 bmp PEEP:  [5 cmH20] 5 cmH20   Intake/Output Summary (Last 24 hours) at 02/07/2023 1227 Last data filed at 02/07/2023 0800  Gross per 24 hour  Intake 2074.79 ml  Output 900 ml  Net 1174.79 ml   Filed Weights   02/06/23 1136 02/07/23 0357  Weight: 54.9 kg 60.9 kg    Examination: General: Elderly lady, very frail HEENT: Moist oral mucosa Neuro: Alert and oriented x 3 CV: S1-S2 appreciated, irregularly irregular pulse PULM: Decreased air movement bilaterally GI: soft, bsx4 active  Extremities: Skin is warm and dry Skin: no rashes or lesions   I reviewed  nursing notes, Consultant notes, last 24 h vitals and pain scores, last 48 h intake and output, last 24 h labs and trends, and last 24 h imaging results. Resolved Hospital Problem list     Assessment & Plan:   Possible right lower lobe pneumonia Shock likely secondary to sepsis -Continue pressors -Continue cautious hydration -Continue midodrine -Continue antibiotics azithromycin and Rocephin -Follow cultures -Trend fever curve  Chronic diastolic congestive heart failure Severe mitral valve regurgitation s/p mitral clip Severe aortic insufficiency -Hold GM DT while hypotensive -Continue statin  Paroxysmal atrial fibrillation Continue telemonitoring -Continue amiodarone  Chronic obstructive pulmonary disease Chronic respiratory failure with hypoxia Still remains an active smoker -Continue Pulmicort/Brovana -as Needed Xopenex  Trend leukocytosis -Will hold on current antibiotic therapy, will consider change if leukocytosis does not improve  Chronic kidney disease stage IIIa Hypokalemia -Replete electrolytes -Avoid nephrotoxic medications -Ensure to maintain renal perfusion  Acute on chronic anemia -Trend CBC -Home Eliquis on hold -Will resume in a.m. if H&H remained stable -Does have some hemoptysis  Obstructive sleep apnea on CPAP -Continue CPAP nightly  Pulmonary nodule Tobacco abuse -Cessation counseling  Peripheral neuropathy -Home Lyrica on hold at present  Best Practice (right click and "Reselect all SmartList Selections" daily)   Diet/type: clear liquids DVT prophylaxis: SCD given low hgb; consider restarting home eliquis tomorrow if hgb stable GI prophylaxis: PPI Lines: N/A Foley:  N/A Code Status:  DNR Last date of multidisciplinary goals of care discussion [11/13 spoke with daughter over phone. States last time they spoke with doctor patient made decision to be DNR/DNI. She states she would be okay w/ central line if needed.] Discussed with  patient at length with daughter present at bedside today  Labs   CBC: Recent Labs  Lab 02/04/23 1420 02/06/23 1158 02/07/23 0330  WBC 8.2 11.0* 16.4*  NEUTROABS 6.5 9.3*  --   HGB 8.6* 7.6* 8.3*  HCT 28.0* 25.2* 26.8*  MCV 95.6 95.8 95.4  PLT 147* 145* 213    Basic Metabolic Panel: Recent Labs  Lab 02/04/23 1420 02/06/23 1158 02/06/23 1418 02/07/23 0330  NA 137 134*  --  128*  K 3.7 3.4*  --  4.1  CL 102 101  --  101  CO2 27 22  --  19*  GLUCOSE 92 130*  --  132*  BUN 21 23  --  22  CREATININE 1.12* 1.38*  --  1.34*  CALCIUM 9.2 8.6*  --  7.7*  MG  --   --  1.9 2.8*   GFR: Estimated Creatinine Clearance: 29.5 mL/min (A) (by C-G formula based on SCr of 1.34 mg/dL (H)). Recent Labs  Lab 02/04/23 1420 02/06/23 1158 02/06/23 1418 02/06/23 1437 02/06/23 1801 02/07/23 0330  PROCALCITON  --   --  0.26  --   --   --   WBC 8.2 11.0*  --   --   --  16.4*  LATICACIDVEN  --   --  2.7* 2.5* 0.9  --  Liver Function Tests: Recent Labs  Lab 02/04/23 1420 02/06/23 1158  AST 16 14*  ALT 10 10  ALKPHOS 73 65  BILITOT 0.8 1.0  PROT 6.7 6.1*  ALBUMIN 2.7* 2.3*   No results for input(s): "LIPASE", "AMYLASE" in the last 168 hours. No results for input(s): "AMMONIA" in the last 168 hours.  ABG    Component Value Date/Time   PHART 7.413 01/02/2023 1548   PCO2ART 43.4 01/02/2023 1548   PO2ART 30 (LL) 01/02/2023 1548   HCO3 26.0 01/16/2023 0950   TCO2 28 01/16/2023 0950   ACIDBASEDEF 1.0 01/16/2023 0950   O2SAT 78 01/16/2023 0950     Coagulation Profile: Recent Labs  Lab 02/06/23 1300  INR 2.5*    Cardiac Enzymes: No results for input(s): "CKTOTAL", "CKMB", "CKMBINDEX", "TROPONINI" in the last 168 hours.  HbA1C: No results found for: "HGBA1C"  CBG: Recent Labs  Lab 02/06/23 2006 02/06/23 2341 02/07/23 0330 02/07/23 0743 02/07/23 1122  GLUCAP 165* 148* 118* 104* 114*    Review of Systems:     Past Medical History:  She,  has a past  medical history of Atrial fibrillation with rapid ventricular response (HCC) (04/10/2017), Bladder tumor (08/06/2022), BMI 28.0-28.9,adult (05/21/2022), Bronchitis (03/07/2022), CHF (congestive heart failure) (HCC), Chronic anticoagulation (06/10/2019), Chronic diastolic heart failure (HCC), Chronic obstructive pulmonary disease (HCC) (05/21/2022), Cigarette smoker (05/21/2022), CKD (chronic kidney disease) stage 3, GFR 30-59 ml/min (HCC), Demand ischemia (HCC) (09/25/2016), Dysrhythmia, Essential hypertension, 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 (06/29/2022), GERD (gastroesophageal reflux disease), HCAP (healthcare-associated pneumonia) (04/10/2017), Headache, Heart murmur, History of bladder cancer, Hypercholesterolemia (05/21/2022), Iron deficiency anemia due to chronic blood loss (06/29/2022), Lung nodule, Myocardial infarction (HCC) (04/07/2017), OSA on CPAP (04/08/2017), Osteopenia (05/21/2022), Osteoporosis, PAF (paroxysmal atrial fibrillation) (HCC) (09/24/2016), S/P mitral valve clip implantation (01/16/2023), Sleep apnea, Stage 3a chronic kidney disease (HCC) (05/21/2022), Symptomatic anemia (08/07/2022), and Tobacco abuse.   Surgical History:   Past Surgical History:  Procedure Laterality Date   ABDOMINAL HYSTERECTOMY     BLADDER SURGERY     COLONOSCOPY N/A 12/28/2016   Procedure: COLONOSCOPY;  Surgeon: Sherrilyn Rist, MD;  Location: [facility] ENDOSCOPY;  Service: Gastroenterology;  Laterality: N/A;   LEFT HEART CATH AND CORONARY ANGIOGRAPHY N/A 04/09/2017   Procedure: LEFT HEART CATH AND CORONARY ANGIOGRAPHY;  Surgeon: Corky Crafts, MD;  Location: [facility] INVASIVE CV LAB;  Service: Cardiovascular;  Laterality: N/A;   MOUTH SURGERY     POLYPECTOMY N/A 12/28/2016   Procedure: POLYPECTOMY with injection of Elevue;  Surgeon: Sherrilyn Rist, MD;  Location: [facility] ENDOSCOPY;  Service: Gastroenterology;  Laterality: N/A;   RIGHT/LEFT HEART CATH AND CORONARY  ANGIOGRAPHY N/A 01/02/2023   Procedure: RIGHT/LEFT HEART CATH AND CORONARY ANGIOGRAPHY;  Surgeon: Iran Ouch, MD;  Location: [facility] INVASIVE CV LAB;  Service: Cardiovascular;  Laterality: N/A;   TEE WITHOUT CARDIOVERSION N/A 01/01/2023   Procedure: TRANSESOPHAGEAL ECHOCARDIOGRAM;  Surgeon: Jodelle Red, MD;  Location: [facility] INVASIVE CV LAB;  Service: Cardiovascular;  Laterality: N/A;   TRANSCATHETER MITRAL EDGE TO EDGE REPAIR N/A 01/16/2023   Procedure: TRANSCATHETER MITRAL EDGE TO EDGE REPAIR;  Surgeon: Tonny Bollman, MD;  Location: [facility] INVASIVE CV LAB;  Service: Cardiovascular;  Laterality: N/A;   TRANSURETHRAL RESECTION OF BLADDER TUMOR N/A 08/09/2022   Procedure: TRANSURETHRAL RESECTION OF BLADDER TUMOR (TURBT) BILATERAL RETROGRADES;  Surgeon: Loletta Parish., MD;  Location: [facility] ORS;  Service: Urology;  Laterality: N/A;   TRANSURETHRAL RESECTION OF BLADDER TUMOR N/A 10/05/2022   Procedure: RESTAGING  TRANSURETHRAL RESECTION OF BLADDER TUMOR (TURBT), CYSTOSCOPY, BILATERAL RETROGRADE PYELOGRAM;  Surgeon: Loletta Parish., MD;  Location: [facility] ORS;  Service: Urology;  Laterality: N/A;  1 HR     Social History:   reports that she has been smoking cigarettes. She has a 26 pack-year smoking history. She has never used smokeless tobacco. She reports that she does not drink alcohol and does not use drugs.   Family History:  Her family history includes Arrhythmia in her father; Bladder Cancer in her brother; CAD in her father; Cancer in her father; Heart disease in her father; Hypertension in her mother; Lung cancer in her brother; Stroke in her mother. There is no history of Colon cancer, Esophageal cancer, Stomach cancer, or Colon polyps.   Allergies Allergies  Allergen Reactions   Albuterol Palpitations   Lisinopril Cough    The patient is critically ill with multiple organ systems failure and requires high complexity decision making for assessment and support, frequent evaluation  and titration of therapies, application of advanced monitoring technologies and extensive interpretation of multiple databases. Critical Care Time devoted to patient care services described in this note independent of APP/resident time (if applicable)  is 33 minutes.   Virl Diamond MD [facility] Personal pager: See Amion If unanswered, please page CCM On-call: #[phone]

## 2023-02-07 NOTE — Progress Notes (Signed)
Patient on home CPAP unit. 10L O2 bled in .

## 2023-02-07 NOTE — Progress Notes (Signed)
PHARMACY - ANTICOAGULATION CONSULT NOTE  Pharmacy Consult for Heparin Indication: atrial fibrillation  Allergies  Allergen Reactions   Albuterol Palpitations   Lisinopril Cough    Patient Measurements: Height: 5\' 3"  (160 cm) Weight: 60.9 kg (134 lb 4.2 oz) IBW/kg (Calculated) : 52.4 Heparin Dosing Weight: 54.9 kg  Vital Signs: Temp: 97.8 F (36.6 C) (11/14 1543) Temp Source: Axillary (11/14 1543) BP: 111/61 (11/14 1300) Pulse Rate: 121 (11/14 1300)  Labs: Recent Labs    02/06/23 1158 02/06/23 1300 02/06/23 1418 02/06/23 1802 02/07/23 0330  HGB 7.6*  --   --   --  8.3*  HCT 25.2*  --   --   --  26.8*  PLT 145*  --   --   --  213  APTT  --  46*  --   --   --   LABPROT  --  27.5*  --   --   --   INR  --  2.5*  --   --   --   CREATININE 1.38*  --   --   --  1.34*  TROPONINIHS  --   --  16 23*  --     Estimated Creatinine Clearance: 29.5 mL/min (A) (by C-G formula based on SCr of 1.34 mg/dL (H)).   Medical History: Past Medical History:  Diagnosis Date   Atrial fibrillation with rapid ventricular response (HCC) 04/10/2017   Bladder tumor 08/06/2022   BMI 28.0-28.9,adult 05/21/2022   Bronchitis 03/07/2022   CHF (congestive heart failure) (HCC)    Chronic anticoagulation 06/10/2019   Eliquis   Chronic diastolic heart failure (HCC)    Chronic obstructive pulmonary disease (HCC) 05/21/2022   Cigarette smoker 05/21/2022   CKD (chronic kidney disease) stage 3, GFR 30-59 ml/min (HCC)    Demand ischemia (HCC) 09/25/2016   Normal coronaries Jan 2019 when admitted with AF with RVR and chest pain   Dysrhythmia    a-fib   Essential hypertension    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 06/29/2022   GERD (gastroesophageal reflux disease)    HCAP (healthcare-associated pneumonia) 04/10/2017   Headache    Heart murmur    History of bladder cancer    Hypercholesterolemia 05/21/2022   Iron deficiency anemia due to chronic blood loss  06/29/2022   Lung nodule    Myocardial infarction (HCC) 04/07/2017   NSTEMI   OSA on CPAP 04/08/2017   Osteopenia 05/21/2022   Osteoporosis    PAF (paroxysmal atrial fibrillation) (HCC) 09/24/2016   CHADS2 vasc=3   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   S/p successful transcatheter edge-to-edge mitral valve repair with one Mitral Clip NT positioned A3/P3 position   Sleep apnea    cpap  setting at 2.5 per patient    Stage 3a chronic kidney disease (HCC) 05/21/2022   Symptomatic anemia 08/07/2022   Tobacco abuse     Medications:  Scheduled:   arformoterol  15 mcg Nebulization Q12H   budesonide (PULMICORT) nebulizer solution  0.5 mg Nebulization BID   Chlorhexidine Gluconate Cloth  6 each Topical Daily   heparin injection (subcutaneous)  5,000 Units Subcutaneous Q8H   midodrine  10 mg Oral TID   montelukast  10 mg Oral QPM   pantoprazole (PROTONIX) IV  40 mg Intravenous Q12H   rosuvastatin  5 mg Oral QODAY   sodium chloride flush  10-40 mL Intracatheter Q12H   Infusions:   amiodarone     Followed by   Melene Muller ON 02/08/2023] amiodarone  azithromycin Stopped (02/07/23 1351)   cefTRIAXone (ROCEPHIN)  IV Stopped (02/07/23 1326)   norepinephrine (LEVOPHED) Adult infusion Stopped (02/07/23 1631)   PRN: [START ON 02/08/2023] acetaminophen, dextromethorphan-guaiFENesin, docusate sodium, levalbuterol, mouth rinse, polyethylene glycol, sodium chloride flush  Assessment: 77 yo female on chronic Eliquis for hx afib, now with hypotension and severe MR to transition to IV heparin in case invasive procedures required.   Hgb 8.3 (improved from 7.6), Plts wnl,  SCr 1.34 aPTT 46 INR 2.5 (likely from Eliquis use)  Last dose of Eliquis taken 11/13 at 10:00, therefore, will utilize aPTT to monitor heparin as heparin levels will be falsely elevated in the setting of recent DOAC use.  Goal of Therapy:  Heparin level 0.3-0.7 units/ml Monitor platelets by anticoagulation protocol:  Yes   Plan:  No heparin bolus since received 5k units SQ at ~13:00 Start heparin infusion at 700 units/hr Check heparin level and aPTT 8hrs after starting and daily - check both until correlating, then can titrate using heparin level only Continue to monitor H&H and platelets  Loralee Pacas, PharmD, BCPS 02/07/2023,6:15 PM  Please check AMION for all [facility] phone numbers After 10:00 PM, call Main Pharmacy [phone]

## 2023-02-07 NOTE — Consult Note (Signed)
Cardiology Consultation   Patient ID: Dana Horton MRN: [id number]; DOB: [birth year]  Admit date: 02/06/2023 Date of Consult: 02/07/2023  PCP:  Crist Fat, MD   [facility] Providers Cardiologist:  Norman Herrlich, MD        Patient Profile:   Dana Horton is a 77 y.o. female with a hx of MR/AR/TR and AFib who is being seen 02/07/2023 for the evaluation of hypotension and severe MR at the request of Dr. Wynona Neat.  History of Present Illness:   Dana Horton  is a 77 y.o. female with a history of severe mitral insufficiency status post Mitraclip NT (A3/P3) placement 01/16/2023, severe aortic insufficiency, tricuspid insufficiency, persistent atrial fibrillation on Eliquis, HFpEF, HTN, current tobacco use with COPD, OSA on CPAP, CKD stage IIIa, hx of bladder cancer, HLD, GERD, iron deficiency anemia admitted with hypotension.  Her MitraClip procedure showed a good immediate result with reduction from severe mitral insufficiency to mild mitral insufficiency.  Next a transthoracic echocardiogram 01/17/2023 showed mild mitral insufficiency, well position MitraClip with good tissue bridge and a mean mitral valve gradient of 8 mmHg in sinus rhythm with a heart rate of 68 bpm.  There was unchanged severe aortic insufficiency.  She was discharged on oral Eliquis.  She was initially treated with IV amiodarone, subsequent transition to oral amiodarone, but she has been poorly tolerant due to nausea.  She had improved dyspnea following the MitraClip procedure and was generally doing well when seen in follow-up in clinic on 01/23/2023.  She has been experiencing generalized weakness as of 02/06/2023 and found to have hypotension and recurrent atrial fibrillation with rapid ventricular response.  She was afebrile but did have a mildly elevated white blood cell count and a possible right lower lobe infiltrate on chest x-ray.  She was started on  norepinephrine IV and antibiotics, with a preliminary diagnosis of sepsis due to right lower lobe pneumonia.  She has not shown significant improvement.  She remains pressor dependent and has A-fib with RVR (she is only receiving amiodarone 100 mg p.o. daily).  An echocardiogram performed today shows she has recurrent severe mitral insufficiency due to single leaflet detachment of the mitraclip (remains attached to the anterior leaflet).  She continues to have an excellent left ventricular systolic function with an EF of greater than 70%.  She has mild nonobstructive CAD by recent cardiac catheterization.  She is hyponatremic and her creatinine is comparable to recent values during her hospitalization for the MitraClip procedure, although it is substantially higher than earlier in the year when her typical creatinine was around 0.8-0.9.  Her BNP is markedly elevated (1576 compared to a baseline that seems to be around 300-400).  WBC remains elevated.    Past Medical History:  Diagnosis Date   Atrial fibrillation with rapid ventricular response (HCC) 04/10/2017   Bladder tumor 08/06/2022   BMI 28.0-28.9,adult 05/21/2022   Bronchitis 03/07/2022   CHF (congestive heart failure) (HCC)    Chronic anticoagulation 06/10/2019   Eliquis   Chronic diastolic heart failure (HCC)    Chronic obstructive pulmonary disease (HCC) 05/21/2022   Cigarette smoker 05/21/2022   CKD (chronic kidney disease) stage 3, GFR 30-59 ml/min (HCC)    Demand ischemia (HCC) 09/25/2016   Normal coronaries Jan 2019 when admitted with AF with RVR and chest pain   Dysrhythmia    a-fib   Essential hypertension    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 06/29/2022   GERD (gastroesophageal reflux disease)  HCAP (healthcare-associated pneumonia) 04/10/2017   Headache    Heart murmur    History of bladder cancer    Hypercholesterolemia 05/21/2022   Iron deficiency anemia due to chronic blood loss  06/29/2022   Lung nodule    Myocardial infarction (HCC) 04/07/2017   NSTEMI   OSA on CPAP 04/08/2017   Osteopenia 05/21/2022   Osteoporosis    PAF (paroxysmal atrial fibrillation) (HCC) 09/24/2016   CHADS2 vasc=3   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   S/p successful transcatheter edge-to-edge mitral valve repair with one Mitral Clip NT positioned A3/P3 position   Sleep apnea    cpap  setting at 2.5 per patient    Stage 3a chronic kidney disease (HCC) 05/21/2022   Symptomatic anemia 08/07/2022   Tobacco abuse     Past Surgical History:  Procedure Laterality Date   ABDOMINAL HYSTERECTOMY     BLADDER SURGERY     COLONOSCOPY N/A 12/28/2016   Procedure: COLONOSCOPY;  Surgeon: Sherrilyn Rist, MD;  Location: [facility];  Service: Gastroenterology;  Laterality: N/A;   LEFT HEART CATH AND CORONARY ANGIOGRAPHY N/A 04/09/2017   Procedure: LEFT HEART CATH AND CORONARY ANGIOGRAPHY;  Surgeon: Corky Crafts, MD;  Location: [facility] INVASIVE CV LAB;  Service: Cardiovascular;  Laterality: N/A;   MOUTH SURGERY     POLYPECTOMY N/A 12/28/2016   Procedure: POLYPECTOMY with injection of Elevue;  Surgeon: Sherrilyn Rist, MD;  Location: [facility];  Service: Gastroenterology;  Laterality: N/A;   RIGHT/LEFT HEART CATH AND CORONARY ANGIOGRAPHY N/A 01/02/2023   Procedure: RIGHT/LEFT HEART CATH AND CORONARY ANGIOGRAPHY;  Surgeon: Iran Ouch, MD;  Location: [facility] INVASIVE CV LAB;  Service: Cardiovascular;  Laterality: N/A;   TEE WITHOUT CARDIOVERSION N/A 01/01/2023   Procedure: TRANSESOPHAGEAL ECHOCARDIOGRAM;  Surgeon: Jodelle Red, MD;  Location: [facility] INVASIVE CV LAB;  Service: Cardiovascular;  Laterality: N/A;   TRANSCATHETER MITRAL EDGE TO EDGE REPAIR N/A 01/16/2023   Procedure: TRANSCATHETER MITRAL EDGE TO EDGE REPAIR;  Surgeon: Tonny Bollman, MD;  Location: [facility] INVASIVE CV LAB;  Service: Cardiovascular;  Laterality: N/A;   TRANSURETHRAL RESECTION OF BLADDER TUMOR N/A  08/09/2022   Procedure: TRANSURETHRAL RESECTION OF BLADDER TUMOR (TURBT) BILATERAL RETROGRADES;  Surgeon: Loletta Parish., MD;  Location: [facility];  Service: Urology;  Laterality: N/A;   TRANSURETHRAL RESECTION OF BLADDER TUMOR N/A 10/05/2022   Procedure: RESTAGING TRANSURETHRAL RESECTION OF BLADDER TUMOR (TURBT), CYSTOSCOPY, BILATERAL RETROGRADE PYELOGRAM;  Surgeon: Loletta Parish., MD;  Location: [facility];  Service: Urology;  Laterality: N/A;  1 HR       Inpatient Medications: Scheduled Meds:  amiodarone  100 mg Oral Daily   arformoterol  15 mcg Nebulization Q12H   budesonide (PULMICORT) nebulizer solution  0.5 mg Nebulization BID   Chlorhexidine Gluconate Cloth  6 each Topical Daily   heparin injection (subcutaneous)  5,000 Units Subcutaneous Q8H   midodrine  10 mg Oral TID   montelukast  10 mg Oral QPM   pantoprazole (PROTONIX) IV  40 mg Intravenous Q12H   rosuvastatin  5 mg Oral QODAY   sodium chloride flush  10-40 mL Intracatheter Q12H   Continuous Infusions:  azithromycin Stopped (02/07/23 1351)   cefTRIAXone (ROCEPHIN)  IV Stopped (02/07/23 1326)   norepinephrine (LEVOPHED) Adult infusion 2 mcg/min (02/07/23 1500)   PRN Meds: [START ON 02/08/2023] acetaminophen, dextromethorphan-guaiFENesin, docusate sodium, levalbuterol, mouth rinse, polyethylene glycol, sodium chloride flush  Allergies:    Allergies  Allergen Reactions   Albuterol Palpitations  Lisinopril Cough    Social History:   Social History   Socioeconomic History   Marital status: Married    Spouse name: Not on file   Number of children: 3   Years of education: Not on file   Highest education level: Not on file  Occupational History   Occupation: Retired  Tobacco Use   Smoking status: Every Day    Current packs/day: 0.50    Average packs/day: 0.5 packs/day for 52.0 years (26.0 ttl pk-yrs)    Types: Cigarettes   Smokeless tobacco: Never   Tobacco comments:    1/2 pack a day  Vaping Use    Vaping status: Never Used  Substance and Sexual Activity   Alcohol use: No   Drug use: No   Sexual activity: Not Currently  Other Topics Concern   Not on file  Social History Narrative   Not on file   Social Determinants of Health   Financial Resource Strain: Not on file  Food Insecurity: No Food Insecurity (02/06/2023)   Hunger Vital Sign    Worried About Running Out of Food in the Last Year: Never true    Ran Out of Food in the Last Year: Never true  Transportation Needs: No Transportation Needs (02/06/2023)   PRAPARE - Administrator, Civil Service (Medical): No    Lack of Transportation (Non-Medical): No  Physical Activity: Not on file  Stress: Not on file  Social Connections: Not on file  Intimate Partner Violence: Not At Risk (02/06/2023)   Humiliation, Afraid, Rape, and Kick questionnaire    Fear of Current or Ex-Partner: No    Emotionally Abused: No    Physically Abused: No    Sexually Abused: No    Family History:    Family History  Problem Relation Age of Onset   Hypertension Mother    Stroke Mother    Cancer Father    Heart disease Father    CAD Father    Arrhythmia Father    Bladder Cancer Brother    Lung cancer Brother    Colon cancer Neg Hx    Esophageal cancer Neg Hx    Stomach cancer Neg Hx    Colon polyps Neg Hx      ROS:  Please see the history of present illness.   All other ROS reviewed and negative.     Physical Exam/Data:   Vitals:   02/07/23 1200 02/07/23 1230 02/07/23 1300 02/07/23 1543  BP: 104/61 102/63 111/61   Pulse: (!) 113 (!) 116 (!) 121   Resp: (!) 24  (!) 24   Temp:    97.8 F (36.6 C)  TempSrc:    Axillary  SpO2: 98% 97% 93%   Weight:      Height:        Intake/Output Summary (Last 24 hours) at 02/07/2023 1744 Last data filed at 02/07/2023 1735 Gross per 24 hour  Intake 2364.58 ml  Output 2200 ml  Net 164.58 ml      02/07/2023    3:57 AM 02/06/2023   11:36 AM 02/04/2023    1:27 PM  Last 3  Weights  Weight (lbs) 134 lb 4.2 oz 121 lb 0.5 oz 121 lb  Weight (kg) 60.9 kg 54.9 kg 54.885 kg     Body mass index is 23.78 kg/m.  General:  Well nourished, well developed, in no acute distress, but wearing a BiPAP mask.  Appears comfortable HEENT: normal Neck: no JVD Vascular: No carotid bruits;  Distal pulses 2+ bilaterally Cardiac:  normal S1, S2; irregular rapid rhythm; unable to appreciate either systolic or diastolic murmurs, especially with the noise of the BiPAP Lungs:  clear to auscultation bilaterally, no wheezing, rhonchi or rales  Abd: soft, nontender, no hepatomegaly  Ext: no edema Musculoskeletal:  No deformities, BUE and BLE strength normal and equal Skin: warm and dry, warm extremities Neuro:  CNs 2-12 intact, no focal abnormalities noted Psych:  Normal affect   EKG:  The EKG was personally reviewed and demonstrates: Atrial fibrillation with rapid ventricular response Telemetry:  Telemetry was personally reviewed and demonstrates: Atrial fibrillation with rapid ventricular response.  Relevant CV Studies: Echocardiogram 01/17/2023   1. Left ventricular ejection fraction, by estimation, is 65 to 70%. Left  ventricular ejection fraction by 3D volume is 70 %. The left ventricle has  normal function. The left ventricle has no regional wall motion  abnormalities. There is severe asymmetric   left ventricular hypertrophy. Left ventricular diastolic parameters are  indeterminate.   2. Right ventricular systolic function is normal. The right ventricular  size is normal.   3. Left atrial size was moderately dilated.   4. Right atrial size was moderately dilated.   5. Post placement of one NT Mitra Clip device in the A3 P3 position. The  mitral valve has been repaired/replaced. Mild to moderate mitral valve  regurgitation. Moderate mitral stenosis. The mean mitral valve gradient is  8.0 mmHg with average heart rate  of 68 bpm. There is a Mitra-Clip present in the mitral  position. Procedure  Date: 01/16/2023.   6. The aortic valve is tricuspid. Aortic valve regurgitation is severe.  No aortic stenosis is present.   7. The inferior vena cava is normal in size with greater than 50%  respiratory variability, suggesting right atrial pressure of 3 mmHg.   8. Evidence of atrial level shunting detected by color flow Doppler.  There is a small atrial septal defect with predominantly left to right  shunting across the atrial septum.   9. Patient imaged in sinus rhythm.    Echocardiogram 02/07/2023   1. There is new, severe mitral regrugitation. In comparison to 01/17/23  study, the clip appears in some views, and in the 3D view, to only be  attached to the anterior leaflet. There are multiple jets new from prior;  I cannot exclude P3 single leaflet  detachement. Mean mitral gradient not well assessed- heart rate 123 bpm.  Reaching out to Dr. Wynona Neat and inpatient cardiology team.. The mitral  valve has been repaired/replaced. Severe mitral valve regurgitation.   2. Left ventricular ejection fraction, by estimation, is 70 to 75%. The  left ventricle has hyperdynamic function. The left ventricle has no  regional wall motion abnormalities. There is severe asymmetric left  ventricular hypertrophy. Left ventricular  diastolic parameters are indeterminate.   3. Right ventricular systolic function is low normal. The right  ventricular size is dilated.   4. Left atrial size was severely dilated.   5. Right atrial size was moderately dilated.   6. Tricuspid valve regurgitation is severe.   7. The aortic valve is tricuspid. Aortic valve regurgitation is severe.  Aortic valve sclerosis is present, with no evidence of aortic valve  stenosis.   8. Evidence of atrial level shunting detected by color flow Doppler.    Laboratory Data:  High Sensitivity Troponin:   Recent Labs  Lab 02/06/23 1418 02/06/23 1802  TROPONINIHS 16 23*     Chemistry Recent Labs  Lab  02/04/23 1420 02/06/23 1158 02/06/23 1418 02/07/23 0330  NA 137 134*  --  128*  K 3.7 3.4*  --  4.1  CL 102 101  --  101  CO2 27 22  --  19*  GLUCOSE 92 130*  --  132*  BUN 21 23  --  22  CREATININE 1.12* 1.38*  --  1.34*  CALCIUM 9.2 8.6*  --  7.7*  MG  --   --  1.9 2.8*  GFRNONAA 51* 40*  --  41*  ANIONGAP 8 11  --  8    Recent Labs  Lab 02/04/23 1420 02/06/23 1158  PROT 6.7 6.1*  ALBUMIN 2.7* 2.3*  AST 16 14*  ALT 10 10  ALKPHOS 73 65  BILITOT 0.8 1.0   Lipids No results for input(s): "CHOL", "TRIG", "HDL", "LABVLDL", "LDLCALC", "CHOLHDL" in the last 168 hours.  Hematology Recent Labs  Lab 02/04/23 1420 02/06/23 1158 02/07/23 0330  WBC 8.2 11.0* 16.4*  RBC 2.93* 2.63* 2.81*  HGB 8.6* 7.6* 8.3*  HCT 28.0* 25.2* 26.8*  MCV 95.6 95.8 95.4  MCH 29.4 28.9 29.5  MCHC 30.7 30.2 31.0  RDW 18.4* 18.5* 18.0*  PLT 147* 145* 213   Thyroid No results for input(s): "TSH", "FREET4" in the last 168 hours.  BNP Recent Labs  Lab 02/07/23 0330  BNP 1,576.2*    DDimer No results for input(s): "DDIMER" in the last 168 hours.   Radiology/Studies:  ECHOCARDIOGRAM COMPLETE  Result Date: 02/07/2023    ECHOCARDIOGRAM REPORT   Patient Name:   [facility] Gue[facility] Date of Exam: 02/07/2023 Medical Rec #:  [id number]                     Height:       63.0 in Accession #:    [id number]                    Weight:       134.3 lb Date of Birth:  November 10, 1945                    BSA:          1.632 m Patient Age:    76 years                      BP:           111/61 mmHg Patient Gender: F                             HR:           126 bpm. Exam Location:  Inpatient Procedure: 2D Echo, Color Doppler, Cardiac Doppler and 3D Echo REPORT CONTAINS CRITICAL RESULT Indications:    Flash pulmonary edema  History:        Patient has prior history of Echocardiogram examinations, most                 recent 01/17/2023. Mitral Valve Disease, Aortic Valve Disease                 and s/p MitraClip  on 01/16/23 on A3-P3; Risk Factors:Current                 Smoker, Hypertension, Sleep Apnea and Dyslipidemia.  Sonographer:    Milbert Coulter Referring Phys: [id number] Clarene Critchley HOFFMAN IMPRESSIONS  1. There is new, severe  mitral regrugitation. In comparison to 01/17/23 study, the clip appears in some views, and in the 3D view, to only be attached to the anterior leaflet. There are multiple jets new from prior; I cannot exclude P3 single leaflet detachement. Mean mitral gradient not well assessed- heart rate 123 bpm. Reaching out to Dr. Wynona Neat and inpatient cardiology team.. The mitral valve has been repaired/replaced. Severe mitral valve regurgitation.  2. Left ventricular ejection fraction, by estimation, is 70 to 75%. The left ventricle has hyperdynamic function. The left ventricle has no regional wall motion abnormalities. There is severe asymmetric left ventricular hypertrophy. Left ventricular diastolic parameters are indeterminate.  3. Right ventricular systolic function is low normal. The right ventricular size is dilated.  4. Left atrial size was severely dilated.  5. Right atrial size was moderately dilated.  6. Tricuspid valve regurgitation is severe.  7. The aortic valve is tricuspid. Aortic valve regurgitation is severe. Aortic valve sclerosis is present, with no evidence of aortic valve stenosis.  8. Evidence of atrial level shunting detected by color flow Doppler. FINDINGS  Left Ventricle: Left ventricular ejection fraction, by estimation, is 70 to 75%. The left ventricle has hyperdynamic function. The left ventricle has no regional wall motion abnormalities. The left ventricular internal cavity size was normal in size. There is severe asymmetric left ventricular hypertrophy. Left ventricular diastolic parameters are indeterminate. Right Ventricle: The right ventricular size is dilated. No increase in right ventricular wall thickness. Right ventricular systolic function is low normal. Left Atrium: Left atrial  size was severely dilated. Right Atrium: Right atrial size was moderately dilated. Pericardium: There is no evidence of pericardial effusion. Mitral Valve: There is new, severe mitral regrugitation. In comparison to 01/17/23 study, the clip appears in some views, and in the 3D view, to only be attached to the anterior leaflet. There are multiple jets new from prior; I cannot exclude P3 single leaflet detachement. Mean mitral gradient not well assessed- heart rate 123 bpm. Reaching out to Dr. Wynona Neat and inpatient cardiology team. The mitral valve has been repaired/replaced. Severe mitral valve regurgitation. Tricuspid Valve: The tricuspid valve is grossly normal. Tricuspid valve regurgitation is severe. No evidence of tricuspid stenosis. Aortic Valve: The aortic valve is tricuspid. Aortic valve regurgitation is severe. Aortic valve sclerosis is present, with no evidence of aortic valve stenosis. Aortic valve mean gradient measures 6.0 mmHg. Aortic valve peak gradient measures 10.8 mmHg. Aortic valve area, by VTI measures 2.18 cm. Pulmonic Valve: The pulmonic valve was normal in structure. Pulmonic valve regurgitation is mild. No evidence of pulmonic stenosis. Aorta: The aortic root is normal in size and structure. IAS/Shunts: Evidence of atrial level shunting detected by color flow Doppler.  LEFT VENTRICLE PLAX 2D LVIDd:         3.00 cm   Diastology LVIDs:         2.50 cm   LV e' lateral:   10.80 cm/s LV PW:         1.70 cm   LV E/e' lateral: 14.6 LV IVS:        2.20 cm LVOT diam:     1.70 cm LV SV:         55 LV SV Index:   34 LVOT Area:     2.27 cm  RIGHT VENTRICLE RV S prime:     10.90 cm/s TAPSE (M-mode): 1.3 cm LEFT ATRIUM         Index LA diam:    4.50 cm 2.76 cm/m  AORTIC  VALVE AV Area (Vmax):    2.09 cm AV Area (Vmean):   1.98 cm AV Area (VTI):     2.18 cm AV Vmax:           164.00 cm/s AV Vmean:          114.000 cm/s AV VTI:            0.252 m AV Peak Grad:      10.8 mmHg AV Mean Grad:      6.0 mmHg  LVOT Vmax:         151.00 cm/s LVOT Vmean:        99.300 cm/s LVOT VTI:          0.242 m LVOT/AV VTI ratio: 0.96 MITRAL VALVE                TRICUSPID VALVE MV Area (PHT): 5.02 cm     TR Peak grad:   40.7 mmHg MV Decel Time: 151 msec     TR Vmax:        319.00 cm/s MR Peak grad: 104.9 mmHg MR Mean grad: 74.0 mmHg     SHUNTS MR Vmax:      512.00 cm/s   Systemic VTI:  0.24 m MR Vmean:     412.0 cm/s    Systemic Diam: 1.70 cm MV E velocity: 158.00 cm/s Riley Lam MD Electronically signed by Riley Lam MD Signature Date/Time: 02/07/2023/4:49:52 PM    Final    DG Chest Port 1 View  Result Date: 02/07/2023 CLINICAL DATA:  77 year old female with history of acute respiratory failure. EXAM: PORTABLE CHEST 1 VIEW COMPARISON:  Chest x-ray 02/06/2023. FINDINGS: Left internal jugular central venous catheter with tip terminating in the superior cavoatrial junction. Lung volumes are normal. Diffuse interstitial prominence, widespread peribronchial cuffing and patchy multifocal airspace disease, substantially worsened compared to the prior examination, now confluent throughout the right mid to lower lung and in the left lung base. Moderate right and small left pleural effusions have also increased. No pneumothorax. Pulmonary vasculature is largely obscured. Heart size appears mildly enlarged. Upper mediastinal contours are within normal limits. Atherosclerotic calcifications are noted in the thoracic aorta. MitraClip incidentally noted. IMPRESSION: 1. Support apparatus, as above. 2. Rapid worsening of aeration in the lungs, with asymmetrically distributed interstitial and airspace disease (right greater than left), concerning for multilobar bilateral bronchopneumonia. However, given the presence of cardiomegaly and bilateral pleural effusions, some degree of underlying congestive heart failure is also suspected. 3. Aortic atherosclerosis. Electronically Signed   By: Trudie Reed M.D.   On: 02/07/2023  05:15   DG Chest Port 1 View  Result Date: 02/06/2023 CLINICAL DATA:  Central line placement EXAM: PORTABLE CHEST 1 VIEW COMPARISON:  Chest x-ray 02/06/2023 FINDINGS: Left-sided central venous catheter tip projects over the SVC. The heart is mildly enlarged. Bilateral multifocal airspace and interstitial opacities have increased. This is most significant in the right upper lobe. There is a small right pleural effusion. There is no pneumothorax. IMPRESSION: 1. Left-sided central venous catheter tip projects over the SVC. No pneumothorax. 2. Bilateral multifocal airspace and interstitial opacities have increased. This is most significant in the right upper lobe. Findings are worrisome for infection and/or edema. Electronically Signed   By: Darliss Cheney M.D.   On: 02/06/2023 17:49   DG Chest Port 1 View  Result Date: 02/06/2023 CLINICAL DATA:  Questionable sepsis - evaluate for abnormality. Hypotension. Shortness of breath. No chest pain. Cough. EXAM: PORTABLE CHEST 1 VIEW COMPARISON:  01/16/2023. FINDINGS: There is layering small right pleural effusion, increased since the prior study. There are probable associated compressive atelectatic changes in the right lung. No left pleural effusion. Bilateral lungs are otherwise clear. No acute consolidation or lung collapse. There is mild diffuse pulmonary vascular congestion. Stable cardio-mediastinal silhouette. No acute osseous abnormalities. The soft tissues are within normal limits. IMPRESSION: *Layering small right pleural effusion. Mild pulmonary vascular congestion. Electronically Signed   By: Jules Schick M.D.   On: 02/06/2023 15:47     Assessment and Plan:   Cardiogenic shock: Successfully weaned off intravenous pressors, but now on midodrine and blood pressure in mid 90s/60s.  Had lactic acidosis on admission, rapidly normalized.  Although acute severe MR is definitely present and can be the cause of hypotension, she had severe MR before and was  tolerating it reasonably well hemodynamically.  Since her white blood cell count is high and there is a possible diagnosis of pneumonia as well, it is reasonable to continue antibiotics until we clarify the situation. Severe MR: The MitraClip has clearly detached from the posterior leaflet.  There is severe mitral insufficiency.  Will review with structural heart team whether she is a candidate for another attempt at TEER.  She would not benefit from hemodynamic support (such as a balloon pump) due to severe aortic insufficiency.  To make a firm decision regarding another attempt at mitral valve repair we would have to perform a transesophageal echocardiogram.  Her respiratory status does not permit this at this time. Severe AI: Pre-existing. AFib w RVR: Has nausea with oral amiodarone.  For the time being we will use IV amiodarone for ventricular rate control.  I doubt she will convert back to sinus rhythm with this burden of mitral insufficiency.  Will start intravenous heparin rather than apixaban in case she is a candidate for repeat invasive procedures. Acute on chronic respiratory failure with hypoxia: History of COPD and OSA.  Was still smoking a few cigarettes recently.  Usually on O2 2-3 L by nasal cannula at home.  Worse due to pulmonary edema, possibly also pneumonia. Anemia: I am not sure the etiology is clarified, but her hemoglobin is stable when compared to the recent hospitalization for her procedure. CKD 3A: Was diagnosed with this earlier this year, but throughout the early part of this year her creatinine was firmly normal range. Hyponatremia: Has received diuretics.  Fluid restrict.  Monitor. Hypoalbuminemia: Precedes her mitral valve repair procedure, increases the hemodynamic consequences of elevated filling pressures.   Risk Assessment/Risk Scores:        New York Heart Association (NYHA) Functional Class NYHA Class IV  CHA2DS2-VASc Score = 5   This indicates a 7.2% annual  risk of stroke. The patient's score is based upon: CHF History: 1 HTN History: 1 Diabetes History: 0 Stroke History: 0 Vascular Disease History: 0 Age Score: 2 Gender Score: 1       CRITICAL CARE Performed by: Rachelle Hora Takari Lundahl   Total critical care time: 60 minutes  Critical care time was exclusive of separately billable procedures and treating other patients.  Critical care was necessary to treat or prevent imminent or life-threatening deterioration.  Critical care was time spent personally by me on the following activities: development of treatment plan with patient and/or surrogate as well as nursing, discussions with consultants, evaluation of patient's response to treatment, examination of patient, obtaining history from patient or surrogate, ordering and performing treatments and interventions, ordering and review of laboratory studies, ordering and  review of radiographic studies, pulse oximetry and re-evaluation of patient's condition.   For questions or updates, please contact [facility] Please consult [email or web address] for contact info under    Signed, Thurmon Fair, MD  02/07/2023 5:44 PM

## 2023-02-07 NOTE — Progress Notes (Signed)
eLink Physician-Brief Progress Note Patient Name: Dana Horton DOB: 11-24-1945 MRN: [id number]   Date of Service  02/07/2023  HPI/Events of Note  CXR suggestive of hypervolemia, CVP 10. Cumulative fluid balance is + 1,500 per bedside RN.  eICU Interventions  Lasix 40 mg IV x 1, BIPAP ordered.        Thomasene Lot Gillian Meeuwsen 02/07/2023, 3:58 AM

## 2023-02-08 ENCOUNTER — Other Ambulatory Visit: Payer: Self-pay | Admitting: Pulmonary Disease

## 2023-02-08 DIAGNOSIS — R570 Cardiogenic shock: Secondary | ICD-10-CM | POA: Diagnosis not present

## 2023-02-08 DIAGNOSIS — I4819 Other persistent atrial fibrillation: Secondary | ICD-10-CM | POA: Diagnosis not present

## 2023-02-08 DIAGNOSIS — R6521 Severe sepsis with septic shock: Secondary | ICD-10-CM | POA: Diagnosis not present

## 2023-02-08 DIAGNOSIS — I340 Nonrheumatic mitral (valve) insufficiency: Secondary | ICD-10-CM | POA: Diagnosis not present

## 2023-02-08 DIAGNOSIS — I351 Nonrheumatic aortic (valve) insufficiency: Secondary | ICD-10-CM | POA: Diagnosis not present

## 2023-02-08 DIAGNOSIS — A419 Sepsis, unspecified organism: Secondary | ICD-10-CM | POA: Diagnosis not present

## 2023-02-08 LAB — APTT: aPTT: 54 s — ABNORMAL HIGH (ref 24–36)

## 2023-02-08 LAB — CBC
HCT: 22.6 % — ABNORMAL LOW (ref 36.0–46.0)
Hemoglobin: 6.9 g/dL — CL (ref 12.0–15.0)
MCH: 28.5 pg (ref 26.0–34.0)
MCHC: 30.5 g/dL (ref 30.0–36.0)
MCV: 93.4 fL (ref 80.0–100.0)
Platelets: 145 10*3/uL — ABNORMAL LOW (ref 150–400)
RBC: 2.42 MIL/uL — ABNORMAL LOW (ref 3.87–5.11)
RDW: 18.6 % — ABNORMAL HIGH (ref 11.5–15.5)
WBC: 8.3 10*3/uL (ref 4.0–10.5)
nRBC: 0.4 % — ABNORMAL HIGH (ref 0.0–0.2)

## 2023-02-08 LAB — GLUCOSE, CAPILLARY
Glucose-Capillary: 107 mg/dL — ABNORMAL HIGH (ref 70–99)
Glucose-Capillary: 113 mg/dL — ABNORMAL HIGH (ref 70–99)
Glucose-Capillary: 280 mg/dL — ABNORMAL HIGH (ref 70–99)
Glucose-Capillary: 97 mg/dL (ref 70–99)
Glucose-Capillary: 99 mg/dL (ref 70–99)

## 2023-02-08 LAB — PROTIME-INR
INR: 1.6 — ABNORMAL HIGH (ref 0.8–1.2)
Prothrombin Time: 18.8 s — ABNORMAL HIGH (ref 11.4–15.2)

## 2023-02-08 LAB — PREPARE RBC (CROSSMATCH)

## 2023-02-08 LAB — HEMOGLOBIN AND HEMATOCRIT, BLOOD
HCT: 30.5 % — ABNORMAL LOW (ref 36.0–46.0)
Hemoglobin: 9.5 g/dL — ABNORMAL LOW (ref 12.0–15.0)

## 2023-02-08 LAB — LEGIONELLA PNEUMOPHILA SEROGP 1 UR AG: L. pneumophila Serogp 1 Ur Ag: NEGATIVE

## 2023-02-08 LAB — HEPARIN LEVEL (UNFRACTIONATED): Heparin Unfractionated: >1.1 [IU]/mL — ABNORMAL HIGH (ref 0.30–0.70)

## 2023-02-08 LAB — BRAIN NATRIURETIC PEPTIDE: B Natriuretic Peptide: 793.5 pg/mL — ABNORMAL HIGH (ref 0.0–100.0)

## 2023-02-08 MED ORDER — SODIUM CHLORIDE 0.9% IV SOLUTION
Freq: Once | INTRAVENOUS | Status: AC
Start: 2023-02-08 — End: 2023-02-08

## 2023-02-08 MED ORDER — HYDROMORPHONE HCL-NACL 50-0.9 MG/50ML-% IV SOLN
0.0000 mg/h | INTRAVENOUS | Status: DC
  Administered 2023-02-08: 0.5 mg/h via INTRAVENOUS
  Filled 2023-02-08: qty 50

## 2023-02-08 MED ORDER — ACETAMINOPHEN 650 MG RE SUPP: 650.0000 mg | Freq: Four times a day (QID) | RECTAL | Status: DC | PRN

## 2023-02-08 MED ORDER — ORAL CARE MOUTH RINSE: 15.0000 mL | OROMUCOSAL | Status: DC | PRN

## 2023-02-08 MED ORDER — GLYCOPYRROLATE 0.2 MG/ML IJ SOLN: 0.2000 mg | INTRAMUSCULAR | Status: DC | PRN

## 2023-02-08 MED ORDER — DIGOXIN 125 MCG PO TABS: 0.1250 mg | ORAL_TABLET | Freq: Every day | ORAL | Status: DC

## 2023-02-08 MED ORDER — DIGOXIN 0.25 MG/ML IJ SOLN
0.2500 mg | INTRAMUSCULAR | Status: AC
  Administered 2023-02-08: 0.25 mg via INTRAVENOUS
  Filled 2023-02-08 (×2): qty 1

## 2023-02-08 MED ORDER — ACETAMINOPHEN 325 MG PO TABS: 650.0000 mg | ORAL_TABLET | Freq: Four times a day (QID) | ORAL | Status: DC | PRN

## 2023-02-08 MED ORDER — HYDROMORPHONE HCL 1 MG/ML IJ SOLN
0.5000 mg | Freq: Once | INTRAMUSCULAR | Status: AC
  Administered 2023-02-08: 0.5 mg via INTRAVENOUS
  Filled 2023-02-08: qty 0.5

## 2023-02-08 MED ORDER — SODIUM CHLORIDE 0.9 % IV SOLN: INTRAVENOUS | Status: DC

## 2023-02-08 MED ORDER — HYDROMORPHONE BOLUS VIA INFUSION: 1.0000 mg | INTRAVENOUS | Status: DC | PRN

## 2023-02-08 MED ORDER — MIDAZOLAM BOLUS VIA INFUSION (WITHDRAWAL LIFE SUSTAINING TX): 2.0000 mg | INTRAVENOUS | Status: DC | PRN

## 2023-02-08 MED ORDER — POLYVINYL ALCOHOL 1.4 % OP SOLN: 1.0000 [drp] | Freq: Four times a day (QID) | OPHTHALMIC | Status: DC | PRN

## 2023-02-08 MED ORDER — MIDAZOLAM HCL 2 MG/2ML IJ SOLN: 1.0000 mg | INTRAMUSCULAR | Status: DC | PRN

## 2023-02-08 MED ORDER — GLYCOPYRROLATE 1 MG PO TABS: 1.0000 mg | ORAL_TABLET | ORAL | Status: DC | PRN

## 2023-02-08 MED ORDER — DIGOXIN 250 MCG PO TABS
0.2500 mg | ORAL_TABLET | ORAL | Status: DC
  Filled 2023-02-08 (×2): qty 1

## 2023-02-08 MED ORDER — MIDAZOLAM-SODIUM CHLORIDE 100-0.9 MG/100ML-% IV SOLN: 0.0000 mg/h | INTRAVENOUS | Status: DC

## 2023-02-08 NOTE — Progress Notes (Addendum)
   Patient Name: Dana Horton Date of Encounter: 02/08/2023 [facility] Cardiologist: Norman Herrlich, MD   Interval Summary  .    Feeling very poorly, exhausted.  She has not been able to rest due to dyspnea.  Still on BiPAP.  Vital Signs .    Vitals:   02/08/23 1044 02/08/23 1059 02/08/23 1114 02/08/23 1130  BP:  101/63  (!) 110/54  Pulse: (!) 137 (!) 130 (!) 135 (!) 131  Resp: (!) 33 (!) 31 (!) 23 (!) 31  Temp:      TempSrc:      SpO2: (!) 87% 94% 93% 94%  Weight:      Height:        Intake/Output Summary (Last 24 hours) at 02/08/2023 1201 Last data filed at 02/08/2023 1100 Gross per 24 hour  Intake 1317.22 ml  Output 1625 ml  Net -307.78 ml      02/08/2023    4:12 AM 02/07/2023    3:57 AM 02/22/2023   11:36 AM  Last 3 Weights  Weight (lbs) 118 lb 2.7 oz 134 lb 4.2 oz 121 lb 0.5 oz  Weight (kg) 53.6 kg 60.9 kg 54.9 kg      Telemetry/ECG    Atrial fibrillation rapid ventricular response- Personally Reviewed  Physical Exam .   GEN: No acute distress.   Neck: Elevated JVD Cardiac: Irregular rhythm, difficult to auscultate due to noisy BiPAP, seems to have a very faint early-mid systolic murmur at the apex, rubs, or gallops.  Respiratory: Diminished breath sounds bilaterally. GI: Soft, nontender, non-distended  MS: No edema  Assessment & Plan .     Unfortunately, [name] has recurrent severe mitral regurgitation due to mitraclip dislodgment as well as severe aortic insufficiency. Additional complicating factors are rapidly worsening anemia of uncertain cause worsening hyponatremia probably due to heart failure. Discussed with members of the structural heart team.  She was felt to be a rather marginal candidate for mitral valve clip repair in the first place . The odds of a successful repeat attempt are virtually zero. We are not planning to perform additional workup at this time. Will try to see if better rate control by  adding digoxin offers any symptom relief. Prognosis is grim. Discuss palliative care measures, possibly hospice with family. Reviewed with PCCM team.  CRITICAL CARE Performed by: Rachelle Hora Alcides Nutting   Total critical care time: 45 minutes  Critical care time was exclusive of separately billable procedures and treating other patients.  Critical care was necessary to treat or prevent imminent or life-threatening deterioration.  Critical care was time spent personally by me on the following activities: development of treatment plan with patient and/or surrogate as well as nursing, discussions with consultants, evaluation of patient's response to treatment, examination of patient, obtaining history from patient or surrogate, ordering and performing treatments and interventions, ordering and review of laboratory studies, ordering and review of radiographic studies, pulse oximetry and re-evaluation of patient's condition.   For questions or updates, please contact [facility] Please consult [email or web address] for contact info under        Signed, Thurmon Fair, MD

## 2023-02-08 NOTE — Progress Notes (Signed)
Following discussion with patient's daughter and son  Patient also had discussions with family members as well  Decision to transition to comfort measures  Will focus on comfort measures going forward  Orders placed

## 2023-02-08 NOTE — Progress Notes (Signed)
NAME:  Dana Horton, MRN:  [id number], DOB:  1945-11-16, LOS: 2 ADMISSION DATE:  02/12/2023, CONSULTATION DATE:  11/13 REFERRING MD:  Dr. Rhae Hammock, CHIEF COMPLAINT:  shock   History of Present Illness:  Patient is a 77 yo F w/ pertinent PMH Afib, chronic hypotension, severe MVR, chronic diastolic chf, CKD 3a, COPD, HTN, OSA on cpap presents to [facility] on 11/13 w/ hypotension.  Patient states recently medication switched to oral amio for afib. States does not tolerate it well because gives her nausea. Family states not eating/drinking well past few days because of this. Stated she has also been having a cough. Denies sob, chest pain, diarrhea, vomiting. Having worsening generalized weakness and checked BP at home w/ low BP. Came [facility] ED on 11/13 for further workup.   On arrival patient weak but mentating well. BP 90/42 and afib w/ rate 110s. Afebrile but wbc 11. CXR personally reviewed w/ possible RLL infiltrate. Cultures obtained, given fluids, and started on rocephin/azithro. Despite fluids remained hypotensive and started on levo. PCCM consulted.  Pertinent  Medical History   Past Medical History:  Diagnosis Date   Atrial fibrillation with rapid ventricular response (HCC) 04/10/2017   Bladder tumor 08/06/2022   BMI 28.0-28.9,adult 05/21/2022   Bronchitis 03/07/2022   CHF (congestive heart failure) (HCC)    Chronic anticoagulation 06/10/2019   Eliquis   Chronic diastolic heart failure (HCC)    Chronic obstructive pulmonary disease (HCC) 05/21/2022   Cigarette smoker 05/21/2022   CKD (chronic kidney disease) stage 3, GFR 30-59 ml/min (HCC)    Demand ischemia (HCC) 09/25/2016   Normal coronaries Jan 2019 when admitted with AF with RVR and chest pain   Dysrhythmia    a-fib   Essential hypertension    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 06/29/2022   GERD (gastroesophageal reflux disease)    HCAP (healthcare-associated pneumonia) 04/10/2017    Headache    Heart murmur    History of bladder cancer    Hypercholesterolemia 05/21/2022   Iron deficiency anemia due to chronic blood loss 06/29/2022   Lung nodule    Myocardial infarction (HCC) 04/07/2017   NSTEMI   OSA on CPAP 04/08/2017   Osteopenia 05/21/2022   Osteoporosis    PAF (paroxysmal atrial fibrillation) (HCC) 09/24/2016   CHADS2 vasc=3   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   S/p successful transcatheter edge-to-edge mitral valve repair with one Mitral Clip NT positioned A3/P3 position   Sleep apnea    cpap  setting at 2.5 per patient    Stage 3a chronic kidney disease (HCC) 05/21/2022   Symptomatic anemia 08/07/2022   Tobacco abuse      Significant Hospital Events: Including procedures, antibiotic start and stop dates in addition to other pertinent events   11/13 admitted to [facility] hypotension on levo 11/14 remains on pressors 11/15 now on BiPAP  Interim History / Subjective:  Decompensated last evening Now on BiPAP  Objective   Blood pressure (!) 100/56, pulse (!) 127, temperature 97.7 F (36.5 C), temperature source Axillary, resp. rate (!) 34, height 5\' 3"  (1.6 m), weight 53.6 kg, SpO2 94%. CVP:  [2 mmHg-24 mmHg] 23 mmHg  Vent Mode: BIPAP;PSV FiO2 (%):  [40 %-50 %] 40 % PEEP:  [5 cmH20] 5 cmH20 Pressure Support:  [10 cmH20] 10 cmH20   Intake/Output Summary (Last 24 hours) at 02/08/2023 1256 Last data filed at 02/08/2023 1100 Gross per 24 hour  Intake 1317.22 ml  Output 1625 ml  Net -307.78 ml  Filed Weights   02/18/2023 1136 02/07/23 0357 02/08/23 0412  Weight: 54.9 kg 60.9 kg 53.6 kg    Examination: General: Daily, very frail HEENT: BiPAP mask in place Neuro: And oriented x 3 CV: S1-S2 appreciated PULM: Coarse breath sounds, BiPAP noise GI: Bowel sounds appreciated Extremities: Skin is warm and dry Skin: no rashes or lesions   I reviewed nursing notes, Consultant notes, last 24 h vitals and pain scores, last 48 h intake and  output, last 24 h labs and trends, and last 24 h imaging results. Resolved Hospital Problem list     Assessment & Plan:   Right lower lobe pneumonia Shock likely secondary sepsis -Continue pressors -Continue antibiotics -Trend fever curve  Severe mitral regurgitation with mitral clip dislodgment Severe aortic insufficiency -Appreciate cardiology assistance with management  Chronic diastolic congestive heart failure  Paroxysmal atrial fibrillation On amiodarone -Continue telemetry monitoring  Chronic obstructive pulmonary disease Chronic respiratory failure with hypoxia Still remains an active smoker -Continue bronchodilators -Continue BiPAP support  Chronic kidney disease stage IIIa -Avoid nephrotoxic medications -Internal to maintain renal perfusion  Acute on chronic anemia -Home Eliquis on hold -Does have some hemoptysis  History of obstructive sleep apnea -Continue BiPAP at present  History of pulmonary nodule Tobacco use -Counseling  Peripheral neuropathy  Prognosis appears to be grim if not a candidate for any cardiac intervention Need to get palliative medicine involved  Best Practice (right click and "Reselect all SmartList Selections" daily)   Diet/type: clear liquids DVT prophylaxis: SCD given low hgb; consider restarting home eliquis tomorrow if hgb stable GI prophylaxis: PPI Lines: N/A Foley:  N/A Code Status:  DNR Last date of multidisciplinary goals of care discussion [11/13 spoke with daughter over phone. States last time they spoke with doctor patient made decision to be DNR/DNI. She states she would be okay w/ central line if needed.] Discussed with patient at length with daughter present at bedside today  Labs   CBC: Recent Labs  Lab 02/04/23 1420 02/02/2023 1158 02/07/23 0330 02/08/23 0305  WBC 8.2 11.0* 16.4* 8.3  NEUTROABS 6.5 9.3*  --   --   HGB 8.6* 7.6* 8.3* 6.9*  HCT 28.0* 25.2* 26.8* 22.6*  MCV 95.6 95.8 95.4 93.4  PLT  147* 145* 213 145*    Basic Metabolic Panel: Recent Labs  Lab 02/04/23 1420 01/31/2023 1158 02/05/2023 1418 02/07/23 0330  NA 137 134*  --  128*  K 3.7 3.4*  --  4.1  CL 102 101  --  101  CO2 27 22  --  19*  GLUCOSE 92 130*  --  132*  BUN 21 23  --  22  CREATININE 1.12* 1.38*  --  1.34*  CALCIUM 9.2 8.6*  --  7.7*  MG  --   --  1.9 2.8*   GFR: Estimated Creatinine Clearance: 29.5 mL/min (A) (by C-G formula based on SCr of 1.34 mg/dL (H)). Recent Labs  Lab 02/04/23 1420 02/12/2023 1158 02/18/2023 1418 02/12/2023 1437 01/27/2023 1801 02/07/23 0330 02/08/23 0305  PROCALCITON  --   --  0.26  --   --   --   --   WBC 8.2 11.0*  --   --   --  16.4* 8.3  LATICACIDVEN  --   --  2.7* 2.5* 0.9  --   --     Liver Function Tests: Recent Labs  Lab 02/04/23 1420 02/18/2023 1158  AST 16 14*  ALT 10 10  ALKPHOS 73 65  BILITOT  0.8 1.0  PROT 6.7 6.1*  ALBUMIN 2.7* 2.3*   No results for input(s): "LIPASE", "AMYLASE" in the last 168 hours. No results for input(s): "AMMONIA" in the last 168 hours.  ABG    Component Value Date/Time   PHART 7.413 01/02/2023 1548   PCO2ART 43.4 01/02/2023 1548   PO2ART 30 (LL) 01/02/2023 1548   HCO3 26.0 01/16/2023 0950   TCO2 28 01/16/2023 0950   ACIDBASEDEF 1.0 01/16/2023 0950   O2SAT 78 01/16/2023 0950     Coagulation Profile: Recent Labs  Lab 01/30/2023 1300 02/08/23 0305  INR 2.5* 1.6*    Cardiac Enzymes: No results for input(s): "CKTOTAL", "CKMB", "CKMBINDEX", "TROPONINI" in the last 168 hours.  HbA1C: No results found for: "HGBA1C"  CBG: Recent Labs  Lab 02/07/23 1946 02/08/23 0005 02/08/23 0354 02/08/23 0827 02/08/23 1212  GLUCAP 90 99 107* 97 113*    Review of Systems:     Past Medical History:  She,  has a past medical history of Atrial fibrillation with rapid ventricular response (HCC) (04/10/2017), Bladder tumor (08/06/2022), BMI 28.0-28.9,adult (05/21/2022), Bronchitis (03/07/2022), CHF (congestive heart failure) (HCC),  Chronic anticoagulation (06/10/2019), Chronic diastolic heart failure (HCC), Chronic obstructive pulmonary disease (HCC) (05/21/2022), Cigarette smoker (05/21/2022), CKD (chronic kidney disease) stage 3, GFR 30-59 ml/min (HCC), Demand ischemia (HCC) (09/25/2016), Dysrhythmia, Essential hypertension, 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 (06/29/2022), GERD (gastroesophageal reflux disease), HCAP (healthcare-associated pneumonia) (04/10/2017), Headache, Heart murmur, History of bladder cancer, Hypercholesterolemia (05/21/2022), Iron deficiency anemia due to chronic blood loss (06/29/2022), Lung nodule, Myocardial infarction (HCC) (04/07/2017), OSA on CPAP (04/08/2017), Osteopenia (05/21/2022), Osteoporosis, PAF (paroxysmal atrial fibrillation) (HCC) (09/24/2016), S/P mitral valve clip implantation (01/16/2023), Sleep apnea, Stage 3a chronic kidney disease (HCC) (05/21/2022), Symptomatic anemia (08/07/2022), and Tobacco abuse.   Surgical History:   Past Surgical History:  Procedure Laterality Date   ABDOMINAL HYSTERECTOMY     BLADDER SURGERY     COLONOSCOPY N/A 12/28/2016   Procedure: COLONOSCOPY;  Surgeon: Sherrilyn Rist, MD;  Location: [facility] ENDOSCOPY;  Service: Gastroenterology;  Laterality: N/A;   LEFT HEART CATH AND CORONARY ANGIOGRAPHY N/A 04/09/2017   Procedure: LEFT HEART CATH AND CORONARY ANGIOGRAPHY;  Surgeon: Corky Crafts, MD;  Location: [facility] INVASIVE CV LAB;  Service: Cardiovascular;  Laterality: N/A;   MOUTH SURGERY     POLYPECTOMY N/A 12/28/2016   Procedure: POLYPECTOMY with injection of Elevue;  Surgeon: Sherrilyn Rist, MD;  Location: [facility]OS[facility];  Service: Gastroenterology;  Laterality: N/A;   RIGHT/LEFT HEART CATH AND CORONARY ANGIOGRAPHY N/A 01/02/2023   Procedure: RIGHT/LEFT HEART CATH AND CORONARY ANGIOGRAPHY;  Surgeon: Iran Ouch, MD;  Location: [facility] INVASIVE CV LAB;  Service: Cardiovascular;  Laterality: N/A;   TEE WITHOUT CARDIOVERSION  N/A 01/01/2023   Procedure: TRANSESOPHAGEAL ECHOCARDIOGRAM;  Surgeon: Jodelle Red, MD;  Location[facility] INVASIVE CV LAB;  Service: Cardiovascular;  Laterality: N/A;   TRANSCATHETER MITRAL EDGE TO EDGE REPAIR N/A 01/16/2023   Procedure: TRANSCATHETER MITRAL EDGE TO EDGE REPAIR;  Surgeon: Tonny Bollman, MD;  Location: [facility] INVASIVE CV LAB;  Service: Cardiovascular;  Laterality: N/A;   TRANSURETHRAL RESECTION OF BLADDER TUMOR N/A 08/09/2022   Procedure: TRANSURETHRAL RESECTION OF BLADDER TUMOR (TURBT) BILATERAL RETROGRADES;  Surgeon: Loletta Parish., MD;  Location: [facility] ORS;  Service: Urology;  Laterality: N/A;   TRANSURETHRAL RESECTION OF BLADDER TUMOR N/A 10/05/2022   Procedure: RESTAGING TRANSURETHRAL RESECTION OF BLADDER TUMOR (TURBT), CYSTOSCOPY, BILATERAL RETROGRADE PYELOGRAM;  Surgeon: Loletta Parish., MD;  Location: [facility];  Service: Urology;  Laterality: N/A;  1 HR     Social History:   reports that she has been smoking cigarettes. She has a 26 pack-year smoking history. She has never used smokeless tobacco. She reports that she does not drink alcohol and does not use drugs.   Family History:  Her family history includes Arrhythmia in her father; Bladder Cancer in her brother; CAD in her father; Cancer in her father; Heart disease in her father; Hypertension in her mother; Lung cancer in her brother; Stroke in her mother. There is no history of Colon cancer, Esophageal cancer, Stomach cancer, or Colon polyps.   Allergies Allergies  Allergen Reactions   Albuterol Palpitations   Lisinopril Cough    The patient is critically ill with multiple organ systems failure and requires high complexity decision making for assessment and support, frequent evaluation and titration of therapies, application of advanced monitoring technologies and extensive interpretation of multiple databases. Critical Care Time devoted to patient care services described in this note independent of  APP/resident time (if applicable)  is 33 minutes.   Virl Diamond MD [facility] Personal pager: See Amion If unanswered, please page CCM On-call: #[phone]

## 2023-02-09 LAB — BPAM RBC
Blood Product Expiration Date: 202412062359
ISSUE DATE / TIME: 202411150744
Unit Type and Rh: 5100

## 2023-02-09 LAB — TYPE AND SCREEN
ABO/RH(D): O POS
Antibody Screen: NEGATIVE
Unit division: 0

## 2023-02-11 ENCOUNTER — Ambulatory Visit (HOSPITAL_COMMUNITY): Payer: Medicare Other | Admitting: Internal Medicine

## 2023-02-11 LAB — CULTURE, BLOOD (ROUTINE X 2)
Culture: NO GROWTH
Culture: NO GROWTH
Special Requests: ADEQUATE
Special Requests: ADEQUATE

## 2023-02-13 ENCOUNTER — Other Ambulatory Visit: Payer: Self-pay

## 2023-02-18 ENCOUNTER — Ambulatory Visit (HOSPITAL_COMMUNITY): Payer: Medicare Other

## 2023-02-18 ENCOUNTER — Other Ambulatory Visit (HOSPITAL_COMMUNITY): Payer: Medicare Other

## 2023-02-24 NOTE — Progress Notes (Signed)
Pt  expired at 0557 hrs. Confirmed death with  Yetta Numbers. Elink MD notified([place])  02/12/2023 0557  Vitals  Pulse Rate (!) 0  ECG Heart Rate (!) 0  Resp (!) 0  Oxygen Therapy  SpO2 (!) 0 %

## 2023-02-24 NOTE — Death Summary Note (Signed)
DEATH SUMMARY   Patient Details  Name: Dana Horton MRN: [id number] DOB: 04-Apr-1945  Admission/Discharge Information   Admit Date:  14-Feb-2023  Date of Death: Date of Death: Feb 17, 2023  Time of Death: Time of Death: 0557  Length of Stay: 3  Referring Physician: Leonia Reader, Barbara Cower, MD   Reason(s) for Hospitalization   patient presented to the hospital with weakness, low blood pressure at home  Diagnoses  Preliminary cause of death:  Decompensated diastolic heart failure Secondary Diagnoses (including complications and co-morbidities):  Principal Problem:   Septic shock (HCC) Diastolic heart failure Atrial fibrillation Severe mitral valve regurgitation Chronic kidney disease stage IIIa Chronic obstructive pulmonary disease Hypertension Obstructive sleep apnea Coronary artery disease  Brief Hospital Course (including significant findings, care, treatment, and services provided and events leading to death)  Dana Horton is a 77 y.o. year old female who  presented to the hospital with progressive fatigue, shortness of breath.  Noted to be hypotensive at home Remained hypotensive despite cautious fluid hydration, needed to be started on pressors Admitted to the intensive care unit.  Started on antibiotics for questionable pneumonia. She continued to decompensate requiring high flow oxygen and subsequently transition to BiPAP She had an echocardiogram performed showing severe mitral regurgitation.  She had recently had a mitral valve clip placed for severe mitral regurgitation, the clip appears to have been dislodged. Cardiology consultation obtained and felt there is no good option for further management.  Respiratory status also continued to deteriorate with patient becoming more short of breath and more fatigued. Goals of care discussion with patient and her family.  Patient did make known to family that she does not want to continue to struggle and  desires to be made comfortable. Patient was transition to comfort measures and succumbed to her illness on 02-17-23 at 0557 hrs. Pertinent Labs and Studies  Significant Diagnostic Studies ECHOCARDIOGRAM COMPLETE  Result Date: 02/07/2023    ECHOCARDIOGRAM REPORT   Patient Name:   [facility] Date of Exam: 02/07/2023 Medical Rec #:  [id number]                     Height:       63.0 in Accession #:    [id number]                    Weight:       134.3 lb Date of Birth:  [birth year]                    BSA:          1.632 m Patient Age:    77 years                      BP:           111/61 mmHg Patient Gender: F                             HR:           126 bpm. Exam Location:  Inpatient Procedure: 2D Echo, Color Doppler, Cardiac Doppler and 3D Echo REPORT CONTAINS CRITICAL RESULT Indications:    Flash pulmonary edema  History:        Patient has prior history of Echocardiogram examinations, most                 recent 01/17/2023. Mitral Valve Disease, Aortic  Valve Disease                 and s/p MitraClip on 01/16/23 on A3-P3; Risk Factors:Current                 Smoker, Hypertension, Sleep Apnea and Dyslipidemia.  Sonographer:    Milbert Coulter Referring Phys: [id number] Clarene Critchley HOFFMAN IMPRESSIONS  1. There is new, severe mitral regrugitation. In comparison to 01/17/23 study, the clip appears in some views, and in the 3D view, to only be attached to the anterior leaflet. There are multiple jets new from prior; I cannot exclude P3 single leaflet detachement. Mean mitral gradient not well assessed- heart rate 123 bpm. Reaching out to Dr. Wynona Neat and inpatient cardiology team.. The mitral valve has been repaired/replaced. Severe mitral valve regurgitation.  2. Left ventricular ejection fraction, by estimation, is 70 to 75%. The left ventricle has hyperdynamic function. The left ventricle has no regional wall motion abnormalities. There is severe asymmetric left ventricular hypertrophy. Left ventricular  diastolic parameters are indeterminate.  3. Right ventricular systolic function is low normal. The right ventricular size is dilated.  4. Left atrial size was severely dilated.  5. Right atrial size was moderately dilated.  6. Tricuspid valve regurgitation is severe.  7. The aortic valve is tricuspid. Aortic valve regurgitation is severe. Aortic valve sclerosis is present, with no evidence of aortic valve stenosis.  8. Evidence of atrial level shunting detected by color flow Doppler. FINDINGS  Left Ventricle: Left ventricular ejection fraction, by estimation, is 70 to 75%. The left ventricle has hyperdynamic function. The left ventricle has no regional wall motion abnormalities. The left ventricular internal cavity size was normal in size. There is severe asymmetric left ventricular hypertrophy. Left ventricular diastolic parameters are indeterminate. Right Ventricle: The right ventricular size is dilated. No increase in right ventricular wall thickness. Right ventricular systolic function is low normal. Left Atrium: Left atrial size was severely dilated. Right Atrium: Right atrial size was moderately dilated. Pericardium: There is no evidence of pericardial effusion. Mitral Valve: There is new, severe mitral regrugitation. In comparison to 01/17/23 study, the clip appears in some views, and in the 3D view, to only be attached to the anterior leaflet. There are multiple jets new from prior; I cannot exclude P3 single leaflet detachement. Mean mitral gradient not well assessed- heart rate 123 bpm. Reaching out to Dr. Wynona Neat and inpatient cardiology team. The mitral valve has been repaired/replaced. Severe mitral valve regurgitation. Tricuspid Valve: The tricuspid valve is grossly normal. Tricuspid valve regurgitation is severe. No evidence of tricuspid stenosis. Aortic Valve: The aortic valve is tricuspid. Aortic valve regurgitation is severe. Aortic valve sclerosis is present, with no evidence of aortic valve  stenosis. Aortic valve mean gradient measures 6.0 mmHg. Aortic valve peak gradient measures 10.8 mmHg. Aortic valve area, by VTI measures 2.18 cm. Pulmonic Valve: The pulmonic valve was normal in structure. Pulmonic valve regurgitation is mild. No evidence of pulmonic stenosis. Aorta: The aortic root is normal in size and structure. IAS/Shunts: Evidence of atrial level shunting detected by color flow Doppler.  LEFT VENTRICLE PLAX 2D LVIDd:         3.00 cm   Diastology LVIDs:         2.50 cm   LV e' lateral:   10.80 cm/s LV PW:         1.70 cm   LV E/e' lateral: 14.6 LV IVS:        2.20 cm LVOT  diam:     1.70 cm LV SV:         55 LV SV Index:   34 LVOT Area:     2.27 cm  RIGHT VENTRICLE RV S prime:     10.90 cm/s TAPSE (M-mode): 1.3 cm LEFT ATRIUM         Index LA diam:    4.50 cm 2.76 cm/m  AORTIC VALVE AV Area (Vmax):    2.09 cm AV Area (Vmean):   1.98 cm AV Area (VTI):     2.18 cm AV Vmax:           164.00 cm/s AV Vmean:          114.000 cm/s AV VTI:            0.252 m AV Peak Grad:      10.8 mmHg AV Mean Grad:      6.0 mmHg LVOT Vmax:         151.00 cm/s LVOT Vmean:        99.300 cm/s LVOT VTI:          0.242 m LVOT/AV VTI ratio: 0.96 MITRAL VALVE                TRICUSPID VALVE MV Area (PHT): 5.02 cm     TR Peak grad:   40.7 mmHg MV Decel Time: 151 msec     TR Vmax:        319.00 cm/s MR Peak grad: 104.9 mmHg MR Mean grad: 74.0 mmHg     SHUNTS MR Vmax:      512.00 cm/s   Systemic VTI:  0.24 m MR Vmean:     412.0 cm/s    Systemic Diam: 1.70 cm MV E velocity: 158.00 cm/s Riley Lam MD Electronically signed by Riley Lam MD Signature Date/Time: 02/07/2023/4:49:52 PM    Final    DG Chest Port 1 View  Result Date: 02/07/2023 CLINICAL DATA:  76 year old female with history of acute respiratory failure. EXAM: PORTABLE CHEST 1 VIEW COMPARISON:  Chest x-ray 02/11/2023. FINDINGS: Left internal jugular central venous catheter with tip terminating in the superior cavoatrial junction. Lung  volumes are normal. Diffuse interstitial prominence, widespread peribronchial cuffing and patchy multifocal airspace disease, substantially worsened compared to the prior examination, now confluent throughout the right mid to lower lung and in the left lung base. Moderate right and small left pleural effusions have also increased. No pneumothorax. Pulmonary vasculature is largely obscured. Heart size appears mildly enlarged. Upper mediastinal contours are within normal limits. Atherosclerotic calcifications are noted in the thoracic aorta. MitraClip incidentally noted. IMPRESSION: 1. Support apparatus, as above. 2. Rapid worsening of aeration in the lungs, with asymmetrically distributed interstitial and airspace disease (right greater than left), concerning for multilobar bilateral bronchopneumonia. However, given the presence of cardiomegaly and bilateral pleural effusions, some degree of underlying congestive heart failure is also suspected. 3. Aortic atherosclerosis. Electronically Signed   By: Trudie Reed M.D.   On: 02/07/2023 05:15   DG Chest Port 1 View  Result Date: 01/27/2023 CLINICAL DATA:  Central line placement EXAM: PORTABLE CHEST 1 VIEW COMPARISON:  Chest x-ray 02/13/2023 FINDINGS: Left-sided central venous catheter tip projects over the SVC. The heart is mildly enlarged. Bilateral multifocal airspace and interstitial opacities have increased. This is most significant in the right upper lobe. There is a small right pleural effusion. There is no pneumothorax. IMPRESSION: 1. Left-sided central venous catheter tip projects over the SVC. No pneumothorax. 2. Bilateral multifocal airspace  and interstitial opacities have increased. This is most significant in the right upper lobe. Findings are worrisome for infection and/or edema. Electronically Signed   By: Darliss Cheney M.D.   On: 02/01/2023 17:49   DG Chest Port 1 View  Result Date: 02/03/2023 CLINICAL DATA:  Questionable sepsis - evaluate  for abnormality. Hypotension. Shortness of breath. No chest pain. Cough. EXAM: PORTABLE CHEST 1 VIEW COMPARISON:  01/16/2023. FINDINGS: There is layering small right pleural effusion, increased since the prior study. There are probable associated compressive atelectatic changes in the right lung. No left pleural effusion. Bilateral lungs are otherwise clear. No acute consolidation or lung collapse. There is mild diffuse pulmonary vascular congestion. Stable cardio-mediastinal silhouette. No acute osseous abnormalities. The soft tissues are within normal limits. IMPRESSION: *Layering small right pleural effusion. Mild pulmonary vascular congestion. Electronically Signed   By: Jules Schick M.D.   On: 02/12/2023 15:47   ECHOCARDIOGRAM COMPLETE  Result Date: 01/17/2023    ECHOCARDIOGRAM REPORT   Patient Name:   [facility] [place] Date of Exam: 01/17/2023 Medical Rec #:  [id number]                     Height:       63.0 in Accession #:    [id number]                    Weight:       120.0 lb Date of Birth:  1945/12/22                    BSA:          1.556 m Patient Age:    77 years                      BP:           100/37 mmHg Patient Gender: F                             HR:           68 bpm. Exam Location:  Inpatient Procedure: 2D Echo, 3D Echo, Cardiac Doppler and Color Doppler Indications:    Z95.2 s/p Mitral Valve Clip implantation.  History:        Patient has prior history of Echocardiogram examinations, most                 recent 01/16/2023. Aortic Valve Disease and Mitral Valve                 Disease, Signs/Symptoms:Shortness of Breath and Dyspnea; Risk                 Factors:Current Smoker, Hypertension, Sleep Apnea and                 Dyslipidemia.                  Mitral Valve: Mitra-Clip valve is present in the mitral                 position. Procedure Date: 01/16/2023.  Sonographer:    Sheralyn Boatman RDCS Referring Phys: [id number] JILL D MCDANIEL IMPRESSIONS  1. Left ventricular ejection  fraction, by estimation, is 65 to 70%. Left ventricular ejection fraction by 3D volume is 70 %. The left ventricle has normal function. The left ventricle has no regional wall motion abnormalities. There is severe  asymmetric  left ventricular hypertrophy. Left ventricular diastolic parameters are indeterminate.  2. Right ventricular systolic function is normal. The right ventricular size is normal.  3. Left atrial size was moderately dilated.  4. Right atrial size was moderately dilated.  5. Post placement of one NT Mitra Clip device in the A3 P3 position. The mitral valve has been repaired/replaced. Mild to moderate mitral valve regurgitation. Moderate mitral stenosis. The mean mitral valve gradient is 8.0 mmHg with average heart rate of 68 bpm. There is a Mitra-Clip present in the mitral position. Procedure Date: 01/16/2023.  6. The aortic valve is tricuspid. Aortic valve regurgitation is severe. No aortic stenosis is present.  7. The inferior vena cava is normal in size with greater than 50% respiratory variability, suggesting right atrial pressure of 3 mmHg.  8. Evidence of atrial level shunting detected by color flow Doppler. There is a small atrial septal defect with predominantly left to right shunting across the atrial septum.  9. Patient imaged in sinus rhythm. FINDINGS  Left Ventricle: Left ventricular ejection fraction, by estimation, is 65 to 70%. Left ventricular ejection fraction by 3D volume is 70 %. The left ventricle has normal function. The left ventricle has no regional wall motion abnormalities. The left ventricular internal cavity size was normal in size. There is severe asymmetric left ventricular hypertrophy. Left ventricular diastolic parameters are indeterminate. Right Ventricle: The right ventricular size is normal. No increase in right ventricular wall thickness. Right ventricular systolic function is normal. Left Atrium: Left atrial size was moderately dilated. Right Atrium: Right atrial  size was moderately dilated. Pericardium: There is no evidence of pericardial effusion. Mitral Valve: Post placement of one NT Mitra Clip device in the A3 P3 position. The mitral valve has been repaired/replaced. Mild to moderate mitral valve regurgitation. There is a Mitra-Clip present in the mitral position. Procedure Date: 01/16/2023. Moderate mitral valve stenosis. MV peak gradient, 19.5 mmHg. The mean mitral valve gradient is 8.0 mmHg with average heart rate of 68 bpm. Tricuspid Valve: The tricuspid valve is normal in structure. Tricuspid valve regurgitation is mild. Aortic Valve: The aortic valve is tricuspid. Aortic valve regurgitation is severe. Aortic regurgitation PHT measures 357 msec. No aortic stenosis is present. Aortic valve mean gradient measures 9.0 mmHg. Aortic valve peak gradient measures 17.6 mmHg. Aortic valve area, by VTI measures 2.14 cm. Pulmonic Valve: The pulmonic valve was normal in structure. Pulmonic valve regurgitation is not visualized. No evidence of pulmonic stenosis. Aorta: The aortic root and ascending aorta are structurally normal, with no evidence of dilitation. Venous: The inferior vena cava is normal in size with greater than 50% respiratory variability, suggesting right atrial pressure of 3 mmHg. IAS/Shunts: Evidence of atrial level shunting detected by color flow Doppler. There is a small atrial septal defect with predominantly left to right shunting across the atrial septum.  LEFT VENTRICLE PLAX 2D LVIDd:         3.70 cm         Diastology LVIDs:         2.20 cm         LV e' medial:    5.00 cm/s LV PW:         1.80 cm         LV E/e' medial:  45.0 LV IVS:        1.40 cm         LV e' lateral:   5.88 cm/s LVOT diam:     1.80 cm  LV E/e' lateral: 38.3 LV SV:         87 LV SV Index:   56 LVOT Area:     2.54 cm        3D Volume EF                                LV 3D EF:    Left                                             ventricul LV Volumes (MOD)                             ar LV vol d, MOD    107.0 ml                   ejection A2C:                                        fraction LV vol d, MOD    99.9 ml                    by 3D A4C:                                        volume is LV vol s, MOD    22.3 ml                    70 %. A2C: LV vol s, MOD    25.3 ml A4C:                           3D Volume EF: LV SV MOD A2C:   84.7 ml       3D EF:        70 % LV SV MOD A4C:   99.9 ml       LV EDV:       133 ml LV SV MOD BP:    83.5 ml       LV ESV:       40 ml                                LV SV:        93 ml RIGHT VENTRICLE             IVC RV S prime:     13.40 cm/s  IVC diam: 1.90 cm TAPSE (M-mode): 2.0 cm LEFT ATRIUM             Index        RIGHT ATRIUM           Index LA diam:        3.60 cm 2.31 cm/m   RA Area:     18.10 cm LA Vol (A2C):   62.5 ml 40.16 ml/m  RA Volume:   50.60 ml  32.51 ml/m LA Vol (A4C):   62.4 ml 40.10 ml/m  LA Biplane Vol: 65.8 ml 42.28 ml/m  AORTIC VALVE AV Area (Vmax):    1.97 cm AV Area (Vmean):   2.02 cm AV Area (VTI):     2.14 cm AV Vmax:           210.00 cm/s AV Vmean:          139.000 cm/s AV VTI:            0.405 m AV Peak Grad:      17.6 mmHg AV Mean Grad:      9.0 mmHg LVOT Vmax:         162.50 cm/s LVOT Vmean:        110.500 cm/s LVOT VTI:          0.340 m LVOT/AV VTI ratio: 0.84 AI PHT:            357 msec  AORTA Ao Root diam: 2.60 cm Ao Asc diam:  3.10 cm MITRAL VALVE                  TRICUSPID VALVE MV Area (PHT): 2.11 cm       TR Peak grad:   29.6 mmHg MV Area VTI:   1.22 cm       TR Vmax:        272.00 cm/s MV Peak grad:  19.5 mmHg MV Mean grad:  8.0 mmHg       SHUNTS MV Vmax:       2.21 m/s       Systemic VTI:  0.34 m MV Vmean:      134.5 cm/s     Systemic Diam: 1.80 cm MV Decel Time: 359 msec MR Peak grad:    100.4 mmHg MR Mean grad:    68.0 mmHg MR Vmax:         501.00 cm/s MR Vmean:        392.0 cm/s MR PISA:         2.26 cm MR PISA Eff ROA: 17 mm MR PISA Radius:  0.60 cm MV E velocity: 225.00 cm/s MV A velocity: 157.00 cm/s  MV E/A ratio:  1.43 Riley Lam MD Electronically signed by Riley Lam MD Signature Date/Time: 01/17/2023/9:28:57 AM    Final    Structural Heart Procedure  Result Date: 01/17/2023 See full op note   ECHO TEE  Result Date: 01/16/2023    TRANSESOPHOGEAL ECHO REPORT   Patient Name:   [facility] Date of Exam: 01/16/2023 Medical Rec #:  [id number]                     Height:       63.0 in Accession #:    [id number]                    Weight:       120.0 lb Date of Birth:  06/13/1945                    BSA:          1.556 m Patient Age:    77 years                      BP:           112/44 mmHg Patient Gender: F  HR:           114 bpm. Exam Location:  Inpatient Procedure: Transesophageal Echo, 3D Echo, Color Doppler and Cardiac Doppler Indications:     Mitral Regurgitation i34.0  History:         Patient has prior history of Echocardiogram examinations, most                  recent 01/01/2023. Risk Factors:Hypertension, Sleep Apnea and                  Dyslipidemia.  Sonographer:     Irving Burton Senior RDCS Referring Phys:  [phone] MICHAEL COOPER Diagnosing Phys: Riley Lam MD  Sonographer Comments: 1 MitraClip NT PROCEDURE: After discussion of the risks and benefits of a TEE, an informed consent was obtained from the patient. The transesophogeal probe was passed without difficulty through the esophogus of the patient. Sedation performed by different physician. The patient was monitored while under deep sedation. The patient developed no complications during the procedure.  IMPRESSIONS  1. Left ventricular ejection fraction, by estimation, is 65 to 70%. The left ventricle has normal function.  2. Right ventricular systolic function is normal. The right ventricular size is normal.  3. Patient was in sinus rhythm during procedure, transitioned to atrial fibrillation, and was successfully cardioversted. Left atrial size was moderately dilated. No  left atrial/left atrial appendage thrombus was detected. The LAA emptying velocity was 57 cm/s.  4. Right atrial size was moderately dilated.  5. Prior to procedure start, severe mitral regurgitation. There was both a flail segment and a posterior cleft (best seen in 3D images). Complete systolic blunting of the left sided pulmonary vein signal. There was moderate tricuspid regurgitation with lipomatous interatrial septal hypertrophy. Suitable leaflet length for mTEER. Baseline mean gradient 4 mm Hg (sinus rhythm rate 87 bpm) with a double envelope artifact. X-plane mva 3.93 cm2.     During the procedure a interior posterior transeptal puncture was performed. 1 MitraClip NT was placed at the A3-P3 defect away from the cleft.     Patient was cardioverted.     In sinus rhythm, mild residual mitral regurgitation. Patient has +1 MR with improved systolic flow through pulmonary veins. 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 No pericardial effusion. 3D MVA 2.9 cm2 Mean gradient of 8 mm Hg at a rate of 72 bpm.. The mitral valve has been repaired/replaced. Mild mitral valve regurgitation.  6. Tricuspid valve regurgitation is mild to moderate.  7. The aortic valve is tricuspid. Aortic valve regurgitation is severe. No aortic stenosis is present.  8. There is Moderate (Grade III) plaque involving the descending aorta.  9. Severely dilated pulmonary artery. 10. Evidence of atrial level shunting detected by color flow Doppler. Comparison(s): Successful MitraClip placement with residual mitral valve gradient. FINDINGS  Left Ventricle: Left ventricular ejection fraction, by estimation, is 65 to 70%. The left ventricle has normal function. The left ventricular internal cavity size was normal in size. Right Ventricle: The right ventricular size is normal. No increase in right ventricular wall thickness. Right ventricular systolic function is normal. Left Atrium: Patient was in sinus rhythm during procedure,  transitioned to atrial fibrillation, and was successfully cardioversted. Left atrial size was moderately dilated. No left atrial/left atrial appendage thrombus was detected. The LAA emptying velocity was 57 cm/s. Right Atrium: Right atrial size was moderately dilated. Pericardium: There is no evidence of pericardial effusion. Mitral Valve: Prior to procedure start, severe mitral regurgitation. There was both a  flail segment and a posterior cleft (best seen in 3D images). Complete systolic blunting of the left sided pulmonary vein signal. There was moderate tricuspid regurgitation with lipomatous interatrial septal hypertrophy. Suitable leaflet length for mTEER. Baseline mean gradient 4 mm Hg (sinus rhythm rate 87 bpm) with a double envelope artifact. X-plane mva 3.93 cm2. During the procedure a interior posterior transeptal puncture was performed. 1 MitraClip NT was placed at the A3-P3 defect away from the cleft. Patient was cardioverted. In sinus rhythm, mild residual mitral regurgitation. Patient has +1 MR with improved systolic flow through pulmonary veins. 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 No pericardial effusion. 3D MVA 2.9 cm2 Mean gradient of 8 mm Hg at a  rate of 72 bpm. The mitral valve has been repaired/replaced. Mild mitral valve regurgitation. MV peak gradient, 9.5 mmHg. The mean mitral valve gradient is 4.0 mmHg. Tricuspid Valve: The tricuspid valve is normal in structure. Tricuspid valve regurgitation is mild to moderate. No evidence of tricuspid stenosis. Aortic Valve: The aortic valve is tricuspid. Aortic valve regurgitation is severe. Aortic regurgitation PHT measures 209 msec. No aortic stenosis is present. Pulmonic Valve: The pulmonic valve was normal in structure. Pulmonic valve regurgitation is trivial. No evidence of pulmonic stenosis. Aorta: The aortic root, ascending aorta, aortic arch and descending aorta are all structurally normal, with no evidence of dilitation  or obstruction. There is moderate (Grade III) plaque involving the descending aorta. Pulmonary Artery: The pulmonary artery is severely dilated. IAS/Shunts: Evidence of atrial level shunting detected by color flow Doppler. Additional Comments: Spectral Doppler performed. AORTIC VALVE LVOT Vmax:   202.00 cm/s LVOT Vmean:  129.000 cm/s LVOT VTI:    0.373 m AI PHT:      209 msec MITRAL VALVE MV Peak grad: 9.5 mmHg    SHUNTS MV Mean grad: 4.0 mmHg    Systemic VTI: 0.37 m MV Vmax:      1.54 m/s MV Vmean:     97.9 cm/s MR Peak grad: 143.5 mmHg MR Mean grad: 90.0 mmHg MR Vmax:      599.00 cm/s MR Vmean:     445.0 cm/s Riley Lam MD Electronically signed by Riley Lam MD Signature Date/Time: 01/16/2023/11:24:13 AM    Final    DG Chest 2 View  Result Date: 01/16/2023 CLINICAL DATA:  [id number] Pre-op chest exam [id number]. Severe mitral insufficiency. History of congestive heart failure, atrial fibrillation and COPD. EXAM: CHEST - 2 VIEW COMPARISON:  Chest radiograph 12/24/2022.  Chest CT 01/03/2023. FINDINGS: Improved pulmonary edema with small residual right pleural effusion. Patchy lucency of the upper lungs, consistent with emphysema. Stable cardiac and mediastinal contours. No pneumothorax. IMPRESSION: Improved pulmonary edema with small residual right pleural effusion. Electronically Signed   By: Orvan Falconer M.D.   On: 01/16/2023 08:45    Microbiology Recent Results (from the past 240 hour(s))  Resp panel by RT-PCR (RSV, Flu A&B, Covid) Anterior Nasal Swab     Status: None   Collection Time: 01/25/2023 11:58 AM   Specimen: Anterior Nasal Swab  Result Value Ref Range Status   SARS Coronavirus 2 by RT PCR NEGATIVE NEGATIVE Final   Influenza A by PCR NEGATIVE NEGATIVE Final   Influenza B by PCR NEGATIVE NEGATIVE Final    Comment: (NOTE) The Xpert Xpress SARS-CoV-2/FLU/RSV plus assay is intended as an aid in the diagnosis of influenza from Nasopharyngeal swab specimens and should not be  used as a sole basis for treatment. Nasal washings and aspirates are unacceptable for  Xpert Xpress SARS-CoV-2/FLU/RSV testing.  Fact Sheet for Patients: BloggerCourse.com  Fact Sheet for Healthcare Providers: SeriousBroker.it  This test is not yet approved or cleared by the [place] FDA and has been authorized for detection and/or diagnosis of SARS-CoV-2 by FDA under an Emergency Use Authorization (EUA). This EUA will remain in effect (meaning this test can be used) for the duration of the COVID-19 declaration under Section 564(b)(1) of the Act, 21 U.S.C. section 360bbb-3(b)(1), unless the authorization is terminated or revoked.     Resp Syncytial Virus by PCR NEGATIVE NEGATIVE Final    Comment: (NOTE) Fact Sheet for Patients: BloggerCourse.com  Fact Sheet for Healthcare Providers: SeriousBroker.it  This test is not yet approved or cleared by the [place] FDA and has been authorized for detection and/or diagnosis of SARS-CoV-2 by FDA under an Emergency Use Authorization (EUA). This EUA will remain in effect (meaning this test can be used) for the duration of the COVID-19 declaration under Section 564(b)(1) of the Act, 21 U.S.C. section 360bbb-3(b)(1), unless the authorization is terminated or revoked.  Performed at [facility], [address] [place], [place] [address]   Blood Culture (routine x 2)     Status: None   Collection Time: 01/25/2023 11:58 AM   Specimen: BLOOD  Result Value Ref Range Status   Specimen Description BLOOD RIGHT ANTECUBITAL  Final   Special Requests   Final    BOTTLES DRAWN AEROBIC AND ANAEROBIC Blood Culture adequate volume   Culture   Final    NO GROWTH 5 DAYS Performed at [facility], [address] [place], [place] [address]    Report Status 02/11/2023 FINAL  Final  Blood Culture (routine x 2)     Status: None    Collection Time: 02/07/2023 12:03 PM   Specimen: BLOOD  Result Value Ref Range Status   Specimen Description BLOOD LEFT ANTECUBITAL  Final   Special Requests   Final    BOTTLES DRAWN AEROBIC AND ANAEROBIC Blood Culture adequate volume   Culture   Final    NO GROWTH 5 DAYS Performed at [facility], [address], [place], [place] [address]    Report Status 02/11/2023 FINAL  Final  Expectorated Sputum Assessment w Gram Stain, Rflx to Resp Cult     Status: None   Collection Time: 01/27/2023  2:08 PM   Specimen: Expectorated Sputum  Result Value Ref Range Status   Specimen Description EXPECTORATED SPUTUM  Final   Special Requests NONE  Final   Sputum evaluation   Final    Sputum specimen not acceptable for testing.  Please recollect.   GRAM STAIN RESULTS PHONED TO: BOPE RN Performed at [facility], [address] [place], [place] [address]    Report Status 02/07/2023 FINAL  Final  Respiratory (~20 pathogens) panel by PCR     Status: None   Collection Time: 02/21/2023  6:02 PM   Specimen: Nasopharyngeal Swab; Respiratory  Result Value Ref Range Status   Adenovirus NOT DETECTED NOT DETECTED Final   Coronavirus 229E NOT DETECTED NOT DETECTED Final    Comment: (NOTE) The Coronavirus on the Respiratory Panel, DOES NOT test for the novel  Coronavirus (2019 nCoV)    Coronavirus HKU1 NOT DETECTED NOT DETECTED Final   Coronavirus NL63 NOT DETECTED NOT DETECTED Final   Coronavirus OC43 NOT DETECTED NOT DETECTED Final   Metapneumovirus NOT DETECTED NOT DETECTED Final   Rhinovirus / Enterovirus NOT DETECTED NOT DETECTED Final  Influenza A NOT DETECTED NOT DETECTED Final   Influenza B NOT DETECTED NOT DETECTED Final   Parainfluenza Virus 1 NOT DETECTED NOT DETECTED Final   Parainfluenza Virus 2 NOT DETECTED NOT DETECTED Final   Parainfluenza Virus 3 NOT DETECTED NOT DETECTED Final   Parainfluenza Virus 4 NOT DETECTED NOT DETECTED Final   Respiratory Syncytial Virus NOT DETECTED  NOT DETECTED Final   Bordetella pertussis NOT DETECTED NOT DETECTED Final   Bordetella Parapertussis NOT DETECTED NOT DETECTED Final   Chlamydophila pneumoniae NOT DETECTED NOT DETECTED Final   Mycoplasma pneumoniae NOT DETECTED NOT DETECTED Final    Comment: Performed at [facility], [address] [place], [place] [address]  MRSA Next Gen by PCR, Nasal     Status: None   Collection Time: 02/14/2023  7:00 PM  Result Value Ref Range Status   MRSA by PCR Next Gen NOT DETECTED NOT DETECTED Final    Comment: (NOTE) The GeneXpert MRSA Assay (FDA approved for NASAL specimens only), is one component of a comprehensive MRSA colonization surveillance program. It is not intended to diagnose MRSA infection nor to guide or monitor treatment for MRSA infections. Test performance is not FDA approved in patients less than 67 years old. Performed at [facility], [address] [place], [place] [address]     Lab Basic Metabolic Panel: Recent Labs  Lab 02/04/2023 1158 02/19/2023 1418 02/07/23 0330  NA 134*  --  128*  K 3.4*  --  4.1  CL 101  --  101  CO2 22  --  19*  GLUCOSE 130*  --  132*  BUN 23  --  22  CREATININE 1.38*  --  1.34*  CALCIUM 8.6*  --  7.7*  MG  --  1.9 2.8*   Liver Function Tests: Recent Labs  Lab 02/12/2023 1158  AST 14*  ALT 10  ALKPHOS 65  BILITOT 1.0  PROT 6.1*  ALBUMIN 2.3*   No results for input(s): "LIPASE", "AMYLASE" in the last 168 hours. No results for input(s): "AMMONIA" in the last 168 hours. CBC: Recent Labs  Lab 02/15/2023 1158 02/07/23 0330 02/08/23 0305 02/08/23 1400  WBC 11.0* 16.4* 8.3  --   NEUTROABS 9.3*  --   --   --   HGB 7.6* 8.3* 6.9* 9.5*  HCT 25.2* 26.8* 22.6* 30.5*  MCV 95.8 95.4 93.4  --   PLT 145* 213 145*  --    Cardiac Enzymes: No results for input(s): "CKTOTAL", "CKMB", "CKMBINDEX", "TROPONINI" in the last 168 hours. Sepsis Labs: Recent Labs  Lab 02/05/2023 1158 02/15/2023 1418 02/05/2023 1437 02/08/2023 1801  02/07/23 0330 02/08/23 0305  PROCALCITON  --  0.26  --   --   --   --   WBC 11.0*  --   --   --  16.4* 8.3  LATICACIDVEN  --  2.7* 2.5* 0.9  --   --     Procedures/Operations     Obediah Welles A Roben Tatsch 02/11/2023, 4:20 PM

## 2023-02-24 DEATH — deceased

## 2023-02-28 ENCOUNTER — Ambulatory Visit: Payer: Medicare Other

## 2023-03-06 ENCOUNTER — Ambulatory Visit: Payer: Medicare Other | Admitting: Internal Medicine
